# Patient Record
Sex: Male | Born: 1935
Health system: Southern US, Community
[De-identification: ages and names within clinical notes are randomized; demographics above are authoritative.]

## PROBLEM LIST (undated history)

## (undated) DIAGNOSIS — N189 Chronic kidney disease, unspecified: Secondary | ICD-10-CM

## (undated) DIAGNOSIS — I10 Essential (primary) hypertension: Secondary | ICD-10-CM

## (undated) DIAGNOSIS — I509 Heart failure, unspecified: Secondary | ICD-10-CM

## (undated) DIAGNOSIS — K5790 Diverticulosis of intestine, part unspecified, without perforation or abscess without bleeding: Secondary | ICD-10-CM

## (undated) DIAGNOSIS — I4819 Other persistent atrial fibrillation: Secondary | ICD-10-CM

## (undated) DIAGNOSIS — C801 Malignant (primary) neoplasm, unspecified: Secondary | ICD-10-CM

## (undated) DIAGNOSIS — K219 Gastro-esophageal reflux disease without esophagitis: Secondary | ICD-10-CM

## (undated) DIAGNOSIS — J449 Chronic obstructive pulmonary disease, unspecified: Secondary | ICD-10-CM

## (undated) DIAGNOSIS — D649 Anemia, unspecified: Secondary | ICD-10-CM

## (undated) HISTORY — DX: Chronic kidney disease, unspecified: N18.9

## (undated) HISTORY — DX: Anemia, unspecified: D64.9

## (undated) HISTORY — DX: Chronic obstructive pulmonary disease, unspecified: J44.9

## (undated) HISTORY — DX: Heart failure, unspecified: I50.9

## (undated) HISTORY — PX: PROSTATECTOMY: SHX69

## (undated) HISTORY — PX: PROSTATE SURGERY: SHX751

## (undated) HISTORY — DX: Gastro-esophageal reflux disease without esophagitis: K21.9

## (undated) HISTORY — PX: HERNIA REPAIR: SHX51

## (undated) HISTORY — DX: Malignant (primary) neoplasm, unspecified: C80.1

## (undated) HISTORY — DX: Essential (primary) hypertension: I10

## (undated) MED FILL — Zoledronic Acid Inj Conc For IV Infusion 4 MG/5ML: INTRAVENOUS | Qty: 3.75 | Status: AC

---

## 2011-12-09 DIAGNOSIS — M159 Polyosteoarthritis, unspecified: Secondary | ICD-10-CM | POA: Diagnosis not present

## 2011-12-09 DIAGNOSIS — I10 Essential (primary) hypertension: Secondary | ICD-10-CM | POA: Diagnosis not present

## 2011-12-09 DIAGNOSIS — F17200 Nicotine dependence, unspecified, uncomplicated: Secondary | ICD-10-CM | POA: Diagnosis not present

## 2012-05-26 DIAGNOSIS — F17200 Nicotine dependence, unspecified, uncomplicated: Secondary | ICD-10-CM | POA: Diagnosis not present

## 2012-05-26 DIAGNOSIS — R5383 Other fatigue: Secondary | ICD-10-CM | POA: Diagnosis not present

## 2012-05-26 DIAGNOSIS — N390 Urinary tract infection, site not specified: Secondary | ICD-10-CM | POA: Diagnosis not present

## 2012-05-26 DIAGNOSIS — R5381 Other malaise: Secondary | ICD-10-CM | POA: Diagnosis not present

## 2012-05-26 DIAGNOSIS — R972 Elevated prostate specific antigen [PSA]: Secondary | ICD-10-CM | POA: Diagnosis not present

## 2012-05-26 DIAGNOSIS — C61 Malignant neoplasm of prostate: Secondary | ICD-10-CM | POA: Diagnosis not present

## 2012-07-10 DIAGNOSIS — I10 Essential (primary) hypertension: Secondary | ICD-10-CM | POA: Diagnosis not present

## 2012-07-10 DIAGNOSIS — Z1331 Encounter for screening for depression: Secondary | ICD-10-CM | POA: Diagnosis not present

## 2012-10-16 DIAGNOSIS — N476 Balanoposthitis: Secondary | ICD-10-CM | POA: Diagnosis not present

## 2012-11-26 DIAGNOSIS — N393 Stress incontinence (female) (male): Secondary | ICD-10-CM | POA: Diagnosis not present

## 2012-11-26 DIAGNOSIS — R972 Elevated prostate specific antigen [PSA]: Secondary | ICD-10-CM | POA: Diagnosis not present

## 2012-11-26 DIAGNOSIS — C61 Malignant neoplasm of prostate: Secondary | ICD-10-CM | POA: Diagnosis not present

## 2012-11-26 DIAGNOSIS — N529 Male erectile dysfunction, unspecified: Secondary | ICD-10-CM | POA: Diagnosis not present

## 2013-01-11 DIAGNOSIS — G5600 Carpal tunnel syndrome, unspecified upper limb: Secondary | ICD-10-CM | POA: Diagnosis not present

## 2013-01-11 DIAGNOSIS — I10 Essential (primary) hypertension: Secondary | ICD-10-CM | POA: Diagnosis not present

## 2013-02-02 DIAGNOSIS — G5600 Carpal tunnel syndrome, unspecified upper limb: Secondary | ICD-10-CM | POA: Diagnosis not present

## 2013-02-10 DIAGNOSIS — M6281 Muscle weakness (generalized): Secondary | ICD-10-CM | POA: Diagnosis not present

## 2013-02-10 DIAGNOSIS — M79609 Pain in unspecified limb: Secondary | ICD-10-CM | POA: Diagnosis not present

## 2013-02-10 DIAGNOSIS — G5600 Carpal tunnel syndrome, unspecified upper limb: Secondary | ICD-10-CM | POA: Diagnosis not present

## 2013-02-10 DIAGNOSIS — M7989 Other specified soft tissue disorders: Secondary | ICD-10-CM | POA: Diagnosis not present

## 2013-02-18 DIAGNOSIS — G5600 Carpal tunnel syndrome, unspecified upper limb: Secondary | ICD-10-CM | POA: Diagnosis not present

## 2013-02-18 DIAGNOSIS — M6281 Muscle weakness (generalized): Secondary | ICD-10-CM | POA: Diagnosis not present

## 2013-02-18 DIAGNOSIS — M7989 Other specified soft tissue disorders: Secondary | ICD-10-CM | POA: Diagnosis not present

## 2013-02-18 DIAGNOSIS — M79609 Pain in unspecified limb: Secondary | ICD-10-CM | POA: Diagnosis not present

## 2013-05-26 DIAGNOSIS — N390 Urinary tract infection, site not specified: Secondary | ICD-10-CM | POA: Diagnosis not present

## 2013-05-26 DIAGNOSIS — R5381 Other malaise: Secondary | ICD-10-CM | POA: Diagnosis not present

## 2013-05-26 DIAGNOSIS — F17200 Nicotine dependence, unspecified, uncomplicated: Secondary | ICD-10-CM | POA: Diagnosis not present

## 2013-05-26 DIAGNOSIS — N529 Male erectile dysfunction, unspecified: Secondary | ICD-10-CM | POA: Diagnosis not present

## 2013-05-26 DIAGNOSIS — C61 Malignant neoplasm of prostate: Secondary | ICD-10-CM | POA: Diagnosis not present

## 2013-06-19 DIAGNOSIS — R109 Unspecified abdominal pain: Secondary | ICD-10-CM | POA: Diagnosis not present

## 2013-06-19 DIAGNOSIS — I10 Essential (primary) hypertension: Secondary | ICD-10-CM | POA: Diagnosis not present

## 2013-06-19 DIAGNOSIS — N200 Calculus of kidney: Secondary | ICD-10-CM | POA: Diagnosis not present

## 2013-06-19 DIAGNOSIS — N400 Enlarged prostate without lower urinary tract symptoms: Secondary | ICD-10-CM | POA: Diagnosis not present

## 2013-06-19 DIAGNOSIS — R300 Dysuria: Secondary | ICD-10-CM | POA: Diagnosis not present

## 2013-06-19 DIAGNOSIS — Z79899 Other long term (current) drug therapy: Secondary | ICD-10-CM | POA: Diagnosis not present

## 2013-06-24 DIAGNOSIS — R339 Retention of urine, unspecified: Secondary | ICD-10-CM | POA: Diagnosis not present

## 2013-06-29 DIAGNOSIS — N320 Bladder-neck obstruction: Secondary | ICD-10-CM | POA: Diagnosis not present

## 2013-06-29 DIAGNOSIS — N390 Urinary tract infection, site not specified: Secondary | ICD-10-CM | POA: Diagnosis not present

## 2013-06-29 DIAGNOSIS — C61 Malignant neoplasm of prostate: Secondary | ICD-10-CM | POA: Diagnosis not present

## 2013-06-29 DIAGNOSIS — R972 Elevated prostate specific antigen [PSA]: Secondary | ICD-10-CM | POA: Diagnosis not present

## 2013-06-30 DIAGNOSIS — Z79899 Other long term (current) drug therapy: Secondary | ICD-10-CM | POA: Diagnosis not present

## 2013-06-30 DIAGNOSIS — N320 Bladder-neck obstruction: Secondary | ICD-10-CM | POA: Diagnosis not present

## 2013-06-30 DIAGNOSIS — D303 Benign neoplasm of bladder: Secondary | ICD-10-CM | POA: Diagnosis not present

## 2013-06-30 DIAGNOSIS — I10 Essential (primary) hypertension: Secondary | ICD-10-CM | POA: Diagnosis not present

## 2013-06-30 DIAGNOSIS — C61 Malignant neoplasm of prostate: Secondary | ICD-10-CM | POA: Diagnosis not present

## 2013-06-30 DIAGNOSIS — F17200 Nicotine dependence, unspecified, uncomplicated: Secondary | ICD-10-CM | POA: Diagnosis not present

## 2013-06-30 DIAGNOSIS — N210 Calculus in bladder: Secondary | ICD-10-CM | POA: Diagnosis not present

## 2013-07-06 DIAGNOSIS — N320 Bladder-neck obstruction: Secondary | ICD-10-CM | POA: Diagnosis not present

## 2013-07-06 DIAGNOSIS — C61 Malignant neoplasm of prostate: Secondary | ICD-10-CM | POA: Diagnosis not present

## 2013-07-06 DIAGNOSIS — N390 Urinary tract infection, site not specified: Secondary | ICD-10-CM | POA: Diagnosis not present

## 2013-07-06 DIAGNOSIS — N318 Other neuromuscular dysfunction of bladder: Secondary | ICD-10-CM | POA: Diagnosis not present

## 2013-07-19 DIAGNOSIS — Z1331 Encounter for screening for depression: Secondary | ICD-10-CM | POA: Diagnosis not present

## 2013-07-19 DIAGNOSIS — I10 Essential (primary) hypertension: Secondary | ICD-10-CM | POA: Diagnosis not present

## 2013-07-19 DIAGNOSIS — Z139 Encounter for screening, unspecified: Secondary | ICD-10-CM | POA: Diagnosis not present

## 2013-07-20 DIAGNOSIS — N318 Other neuromuscular dysfunction of bladder: Secondary | ICD-10-CM | POA: Diagnosis not present

## 2013-07-20 DIAGNOSIS — N390 Urinary tract infection, site not specified: Secondary | ICD-10-CM | POA: Diagnosis not present

## 2013-07-20 DIAGNOSIS — R972 Elevated prostate specific antigen [PSA]: Secondary | ICD-10-CM | POA: Diagnosis not present

## 2013-07-20 DIAGNOSIS — N320 Bladder-neck obstruction: Secondary | ICD-10-CM | POA: Diagnosis not present

## 2013-08-19 DIAGNOSIS — R972 Elevated prostate specific antigen [PSA]: Secondary | ICD-10-CM | POA: Diagnosis not present

## 2013-08-19 DIAGNOSIS — C61 Malignant neoplasm of prostate: Secondary | ICD-10-CM | POA: Diagnosis not present

## 2013-08-19 DIAGNOSIS — N320 Bladder-neck obstruction: Secondary | ICD-10-CM | POA: Diagnosis not present

## 2013-08-19 DIAGNOSIS — N393 Stress incontinence (female) (male): Secondary | ICD-10-CM | POA: Diagnosis not present

## 2013-12-09 DIAGNOSIS — R5381 Other malaise: Secondary | ICD-10-CM | POA: Diagnosis not present

## 2013-12-09 DIAGNOSIS — N529 Male erectile dysfunction, unspecified: Secondary | ICD-10-CM | POA: Diagnosis not present

## 2013-12-09 DIAGNOSIS — R5383 Other fatigue: Secondary | ICD-10-CM | POA: Diagnosis not present

## 2013-12-09 DIAGNOSIS — C61 Malignant neoplasm of prostate: Secondary | ICD-10-CM | POA: Diagnosis not present

## 2013-12-09 DIAGNOSIS — N390 Urinary tract infection, site not specified: Secondary | ICD-10-CM | POA: Diagnosis not present

## 2014-02-01 DIAGNOSIS — Z0000 Encounter for general adult medical examination without abnormal findings: Secondary | ICD-10-CM | POA: Diagnosis not present

## 2014-02-01 DIAGNOSIS — Z139 Encounter for screening, unspecified: Secondary | ICD-10-CM | POA: Diagnosis not present

## 2014-02-01 DIAGNOSIS — F17200 Nicotine dependence, unspecified, uncomplicated: Secondary | ICD-10-CM | POA: Diagnosis not present

## 2014-02-01 DIAGNOSIS — I10 Essential (primary) hypertension: Secondary | ICD-10-CM | POA: Diagnosis not present

## 2014-02-01 DIAGNOSIS — Z1331 Encounter for screening for depression: Secondary | ICD-10-CM | POA: Diagnosis not present

## 2014-02-21 DIAGNOSIS — H4011X0 Primary open-angle glaucoma, stage unspecified: Secondary | ICD-10-CM | POA: Diagnosis not present

## 2014-03-15 DIAGNOSIS — H4011X0 Primary open-angle glaucoma, stage unspecified: Secondary | ICD-10-CM | POA: Diagnosis not present

## 2014-04-05 DIAGNOSIS — H40039 Anatomical narrow angle, unspecified eye: Secondary | ICD-10-CM | POA: Diagnosis not present

## 2014-04-26 DIAGNOSIS — H40039 Anatomical narrow angle, unspecified eye: Secondary | ICD-10-CM | POA: Diagnosis not present

## 2014-05-11 DIAGNOSIS — H40039 Anatomical narrow angle, unspecified eye: Secondary | ICD-10-CM | POA: Diagnosis not present

## 2014-06-09 DIAGNOSIS — N390 Urinary tract infection, site not specified: Secondary | ICD-10-CM | POA: Diagnosis not present

## 2014-06-09 DIAGNOSIS — R972 Elevated prostate specific antigen [PSA]: Secondary | ICD-10-CM | POA: Diagnosis not present

## 2014-06-09 DIAGNOSIS — C61 Malignant neoplasm of prostate: Secondary | ICD-10-CM | POA: Diagnosis not present

## 2014-06-09 DIAGNOSIS — N529 Male erectile dysfunction, unspecified: Secondary | ICD-10-CM | POA: Diagnosis not present

## 2014-08-09 DIAGNOSIS — I10 Essential (primary) hypertension: Secondary | ICD-10-CM | POA: Diagnosis not present

## 2014-11-15 DIAGNOSIS — M25462 Effusion, left knee: Secondary | ICD-10-CM | POA: Diagnosis not present

## 2014-11-15 DIAGNOSIS — M25569 Pain in unspecified knee: Secondary | ICD-10-CM | POA: Diagnosis not present

## 2014-11-15 DIAGNOSIS — M25562 Pain in left knee: Secondary | ICD-10-CM | POA: Diagnosis not present

## 2014-12-06 DIAGNOSIS — N3090 Cystitis, unspecified without hematuria: Secondary | ICD-10-CM | POA: Diagnosis not present

## 2014-12-06 DIAGNOSIS — C61 Malignant neoplasm of prostate: Secondary | ICD-10-CM | POA: Diagnosis not present

## 2014-12-06 DIAGNOSIS — N5203 Combined arterial insufficiency and corporo-venous occlusive erectile dysfunction: Secondary | ICD-10-CM | POA: Diagnosis not present

## 2014-12-06 DIAGNOSIS — N393 Stress incontinence (female) (male): Secondary | ICD-10-CM | POA: Diagnosis not present

## 2015-01-28 DIAGNOSIS — I951 Orthostatic hypotension: Secondary | ICD-10-CM | POA: Diagnosis not present

## 2015-01-28 DIAGNOSIS — K922 Gastrointestinal hemorrhage, unspecified: Secondary | ICD-10-CM | POA: Diagnosis not present

## 2015-01-29 DIAGNOSIS — K922 Gastrointestinal hemorrhage, unspecified: Secondary | ICD-10-CM | POA: Diagnosis not present

## 2015-01-29 DIAGNOSIS — K219 Gastro-esophageal reflux disease without esophagitis: Secondary | ICD-10-CM | POA: Diagnosis not present

## 2015-01-29 DIAGNOSIS — D62 Acute posthemorrhagic anemia: Secondary | ICD-10-CM | POA: Diagnosis not present

## 2015-01-29 DIAGNOSIS — N400 Enlarged prostate without lower urinary tract symptoms: Secondary | ICD-10-CM | POA: Diagnosis not present

## 2015-01-30 DIAGNOSIS — K219 Gastro-esophageal reflux disease without esophagitis: Secondary | ICD-10-CM | POA: Diagnosis not present

## 2015-01-30 DIAGNOSIS — D62 Acute posthemorrhagic anemia: Secondary | ICD-10-CM | POA: Diagnosis not present

## 2015-01-30 DIAGNOSIS — K5791 Diverticulosis of intestine, part unspecified, without perforation or abscess with bleeding: Secondary | ICD-10-CM | POA: Diagnosis not present

## 2015-01-30 DIAGNOSIS — K922 Gastrointestinal hemorrhage, unspecified: Secondary | ICD-10-CM | POA: Diagnosis not present

## 2015-01-30 DIAGNOSIS — N400 Enlarged prostate without lower urinary tract symptoms: Secondary | ICD-10-CM | POA: Diagnosis not present

## 2015-01-31 DIAGNOSIS — R1084 Generalized abdominal pain: Secondary | ICD-10-CM | POA: Diagnosis not present

## 2015-01-31 DIAGNOSIS — K922 Gastrointestinal hemorrhage, unspecified: Secondary | ICD-10-CM | POA: Diagnosis not present

## 2015-02-02 DIAGNOSIS — K922 Gastrointestinal hemorrhage, unspecified: Secondary | ICD-10-CM | POA: Diagnosis not present

## 2015-02-02 DIAGNOSIS — D649 Anemia, unspecified: Secondary | ICD-10-CM | POA: Diagnosis not present

## 2015-02-02 DIAGNOSIS — K219 Gastro-esophageal reflux disease without esophagitis: Secondary | ICD-10-CM | POA: Diagnosis not present

## 2015-02-02 DIAGNOSIS — I10 Essential (primary) hypertension: Secondary | ICD-10-CM | POA: Diagnosis not present

## 2015-03-02 DIAGNOSIS — K5791 Diverticulosis of intestine, part unspecified, without perforation or abscess with bleeding: Secondary | ICD-10-CM | POA: Diagnosis not present

## 2015-04-12 DIAGNOSIS — K2900 Acute gastritis without bleeding: Secondary | ICD-10-CM | POA: Diagnosis not present

## 2015-04-12 DIAGNOSIS — Z048 Encounter for examination and observation for other specified reasons: Secondary | ICD-10-CM | POA: Diagnosis not present

## 2015-04-12 DIAGNOSIS — D649 Anemia, unspecified: Secondary | ICD-10-CM | POA: Diagnosis not present

## 2015-04-12 DIAGNOSIS — K2980 Duodenitis without bleeding: Secondary | ICD-10-CM | POA: Diagnosis not present

## 2015-04-12 DIAGNOSIS — K219 Gastro-esophageal reflux disease without esophagitis: Secondary | ICD-10-CM | POA: Diagnosis not present

## 2015-06-08 DIAGNOSIS — R5383 Other fatigue: Secondary | ICD-10-CM | POA: Diagnosis not present

## 2015-06-08 DIAGNOSIS — C61 Malignant neoplasm of prostate: Secondary | ICD-10-CM | POA: Diagnosis not present

## 2015-06-08 DIAGNOSIS — R972 Elevated prostate specific antigen [PSA]: Secondary | ICD-10-CM | POA: Diagnosis not present

## 2015-06-08 DIAGNOSIS — N5203 Combined arterial insufficiency and corporo-venous occlusive erectile dysfunction: Secondary | ICD-10-CM | POA: Diagnosis not present

## 2015-06-08 DIAGNOSIS — N3090 Cystitis, unspecified without hematuria: Secondary | ICD-10-CM | POA: Diagnosis not present

## 2015-07-11 DIAGNOSIS — I1310 Hypertensive heart and chronic kidney disease without heart failure, with stage 1 through stage 4 chronic kidney disease, or unspecified chronic kidney disease: Secondary | ICD-10-CM | POA: Diagnosis not present

## 2015-07-11 DIAGNOSIS — N183 Chronic kidney disease, stage 3 (moderate): Secondary | ICD-10-CM | POA: Diagnosis not present

## 2015-08-08 DIAGNOSIS — M10072 Idiopathic gout, left ankle and foot: Secondary | ICD-10-CM | POA: Diagnosis not present

## 2015-08-15 DIAGNOSIS — Z0000 Encounter for general adult medical examination without abnormal findings: Secondary | ICD-10-CM | POA: Diagnosis not present

## 2015-08-15 DIAGNOSIS — Z139 Encounter for screening, unspecified: Secondary | ICD-10-CM | POA: Diagnosis not present

## 2015-08-15 DIAGNOSIS — Z1389 Encounter for screening for other disorder: Secondary | ICD-10-CM | POA: Diagnosis not present

## 2015-12-07 DIAGNOSIS — C61 Malignant neoplasm of prostate: Secondary | ICD-10-CM | POA: Diagnosis not present

## 2015-12-07 DIAGNOSIS — N393 Stress incontinence (female) (male): Secondary | ICD-10-CM | POA: Diagnosis not present

## 2015-12-07 DIAGNOSIS — N3020 Other chronic cystitis without hematuria: Secondary | ICD-10-CM | POA: Diagnosis not present

## 2015-12-07 DIAGNOSIS — R972 Elevated prostate specific antigen [PSA]: Secondary | ICD-10-CM | POA: Diagnosis not present

## 2015-12-07 DIAGNOSIS — N5203 Combined arterial insufficiency and corporo-venous occlusive erectile dysfunction: Secondary | ICD-10-CM | POA: Diagnosis not present

## 2016-04-09 DIAGNOSIS — I10 Essential (primary) hypertension: Secondary | ICD-10-CM | POA: Diagnosis not present

## 2016-04-09 DIAGNOSIS — M109 Gout, unspecified: Secondary | ICD-10-CM | POA: Diagnosis not present

## 2016-04-09 DIAGNOSIS — S30861A Insect bite (nonvenomous) of abdominal wall, initial encounter: Secondary | ICD-10-CM | POA: Diagnosis not present

## 2016-04-09 DIAGNOSIS — N63 Unspecified lump in breast: Secondary | ICD-10-CM | POA: Diagnosis not present

## 2016-04-09 DIAGNOSIS — N644 Mastodynia: Secondary | ICD-10-CM | POA: Diagnosis not present

## 2016-04-09 DIAGNOSIS — W57XXXA Bitten or stung by nonvenomous insect and other nonvenomous arthropods, initial encounter: Secondary | ICD-10-CM | POA: Diagnosis not present

## 2016-04-11 DIAGNOSIS — N644 Mastodynia: Secondary | ICD-10-CM | POA: Diagnosis not present

## 2016-04-11 DIAGNOSIS — N63 Unspecified lump in breast: Secondary | ICD-10-CM | POA: Diagnosis not present

## 2016-04-11 DIAGNOSIS — N62 Hypertrophy of breast: Secondary | ICD-10-CM | POA: Diagnosis not present

## 2016-06-06 DIAGNOSIS — N318 Other neuromuscular dysfunction of bladder: Secondary | ICD-10-CM | POA: Diagnosis not present

## 2016-06-06 DIAGNOSIS — R972 Elevated prostate specific antigen [PSA]: Secondary | ICD-10-CM | POA: Diagnosis not present

## 2016-06-06 DIAGNOSIS — N3020 Other chronic cystitis without hematuria: Secondary | ICD-10-CM | POA: Diagnosis not present

## 2016-06-06 DIAGNOSIS — N5203 Combined arterial insufficiency and corporo-venous occlusive erectile dysfunction: Secondary | ICD-10-CM | POA: Diagnosis not present

## 2016-06-06 DIAGNOSIS — C61 Malignant neoplasm of prostate: Secondary | ICD-10-CM | POA: Diagnosis not present

## 2016-07-12 DIAGNOSIS — C61 Malignant neoplasm of prostate: Secondary | ICD-10-CM | POA: Diagnosis not present

## 2016-08-28 DIAGNOSIS — H524 Presbyopia: Secondary | ICD-10-CM | POA: Diagnosis not present

## 2016-08-28 DIAGNOSIS — H2513 Age-related nuclear cataract, bilateral: Secondary | ICD-10-CM | POA: Diagnosis not present

## 2016-08-28 DIAGNOSIS — H40033 Anatomical narrow angle, bilateral: Secondary | ICD-10-CM | POA: Diagnosis not present

## 2016-09-05 DIAGNOSIS — H40033 Anatomical narrow angle, bilateral: Secondary | ICD-10-CM | POA: Diagnosis not present

## 2016-09-24 DIAGNOSIS — H40033 Anatomical narrow angle, bilateral: Secondary | ICD-10-CM | POA: Diagnosis not present

## 2016-10-02 DIAGNOSIS — C61 Malignant neoplasm of prostate: Secondary | ICD-10-CM | POA: Diagnosis not present

## 2016-10-02 DIAGNOSIS — R32 Unspecified urinary incontinence: Secondary | ICD-10-CM | POA: Diagnosis not present

## 2016-10-02 DIAGNOSIS — N3000 Acute cystitis without hematuria: Secondary | ICD-10-CM | POA: Diagnosis not present

## 2016-10-10 DIAGNOSIS — H40033 Anatomical narrow angle, bilateral: Secondary | ICD-10-CM | POA: Diagnosis not present

## 2016-11-11 DIAGNOSIS — C61 Malignant neoplasm of prostate: Secondary | ICD-10-CM | POA: Diagnosis not present

## 2016-11-28 DIAGNOSIS — Z9181 History of falling: Secondary | ICD-10-CM | POA: Diagnosis not present

## 2016-11-28 DIAGNOSIS — Z1389 Encounter for screening for other disorder: Secondary | ICD-10-CM | POA: Diagnosis not present

## 2016-11-28 DIAGNOSIS — Z139 Encounter for screening, unspecified: Secondary | ICD-10-CM | POA: Diagnosis not present

## 2016-11-28 DIAGNOSIS — Z0000 Encounter for general adult medical examination without abnormal findings: Secondary | ICD-10-CM | POA: Diagnosis not present

## 2016-11-28 DIAGNOSIS — M109 Gout, unspecified: Secondary | ICD-10-CM | POA: Diagnosis not present

## 2016-11-28 DIAGNOSIS — I1310 Hypertensive heart and chronic kidney disease without heart failure, with stage 1 through stage 4 chronic kidney disease, or unspecified chronic kidney disease: Secondary | ICD-10-CM | POA: Diagnosis not present

## 2016-11-28 DIAGNOSIS — N183 Chronic kidney disease, stage 3 (moderate): Secondary | ICD-10-CM | POA: Diagnosis not present

## 2017-01-09 DIAGNOSIS — H40033 Anatomical narrow angle, bilateral: Secondary | ICD-10-CM | POA: Diagnosis not present

## 2017-01-28 DIAGNOSIS — I951 Orthostatic hypotension: Secondary | ICD-10-CM | POA: Diagnosis not present

## 2017-01-28 DIAGNOSIS — Z6823 Body mass index (BMI) 23.0-23.9, adult: Secondary | ICD-10-CM | POA: Diagnosis not present

## 2017-01-30 DIAGNOSIS — H40033 Anatomical narrow angle, bilateral: Secondary | ICD-10-CM | POA: Diagnosis not present

## 2017-02-04 DIAGNOSIS — Z6823 Body mass index (BMI) 23.0-23.9, adult: Secondary | ICD-10-CM | POA: Diagnosis not present

## 2017-02-04 DIAGNOSIS — R0602 Shortness of breath: Secondary | ICD-10-CM | POA: Diagnosis not present

## 2017-02-04 DIAGNOSIS — I959 Hypotension, unspecified: Secondary | ICD-10-CM | POA: Diagnosis not present

## 2017-02-06 DIAGNOSIS — N179 Acute kidney failure, unspecified: Secondary | ICD-10-CM | POA: Diagnosis not present

## 2017-02-06 DIAGNOSIS — E871 Hypo-osmolality and hyponatremia: Secondary | ICD-10-CM | POA: Diagnosis not present

## 2017-02-06 DIAGNOSIS — N189 Chronic kidney disease, unspecified: Secondary | ICD-10-CM | POA: Diagnosis not present

## 2017-02-06 DIAGNOSIS — D72829 Elevated white blood cell count, unspecified: Secondary | ICD-10-CM | POA: Diagnosis not present

## 2017-02-13 DIAGNOSIS — N179 Acute kidney failure, unspecified: Secondary | ICD-10-CM | POA: Diagnosis not present

## 2017-02-13 DIAGNOSIS — D72829 Elevated white blood cell count, unspecified: Secondary | ICD-10-CM | POA: Diagnosis not present

## 2017-02-18 DIAGNOSIS — D649 Anemia, unspecified: Secondary | ICD-10-CM | POA: Diagnosis not present

## 2017-02-18 DIAGNOSIS — K922 Gastrointestinal hemorrhage, unspecified: Secondary | ICD-10-CM | POA: Diagnosis not present

## 2017-02-18 DIAGNOSIS — I499 Cardiac arrhythmia, unspecified: Secondary | ICD-10-CM | POA: Diagnosis not present

## 2017-02-27 DIAGNOSIS — Z6823 Body mass index (BMI) 23.0-23.9, adult: Secondary | ICD-10-CM | POA: Diagnosis not present

## 2017-02-27 DIAGNOSIS — D649 Anemia, unspecified: Secondary | ICD-10-CM | POA: Diagnosis not present

## 2017-03-11 DIAGNOSIS — D500 Iron deficiency anemia secondary to blood loss (chronic): Secondary | ICD-10-CM | POA: Diagnosis not present

## 2017-03-11 DIAGNOSIS — K219 Gastro-esophageal reflux disease without esophagitis: Secondary | ICD-10-CM | POA: Diagnosis not present

## 2017-03-11 DIAGNOSIS — N189 Chronic kidney disease, unspecified: Secondary | ICD-10-CM | POA: Diagnosis not present

## 2017-03-11 DIAGNOSIS — R160 Hepatomegaly, not elsewhere classified: Secondary | ICD-10-CM | POA: Diagnosis not present

## 2017-03-13 DIAGNOSIS — N3020 Other chronic cystitis without hematuria: Secondary | ICD-10-CM | POA: Diagnosis not present

## 2017-03-13 DIAGNOSIS — C61 Malignant neoplasm of prostate: Secondary | ICD-10-CM | POA: Diagnosis not present

## 2017-03-19 DIAGNOSIS — H353130 Nonexudative age-related macular degeneration, bilateral, stage unspecified: Secondary | ICD-10-CM | POA: Diagnosis not present

## 2017-03-19 DIAGNOSIS — H25813 Combined forms of age-related cataract, bilateral: Secondary | ICD-10-CM | POA: Diagnosis not present

## 2017-03-19 DIAGNOSIS — H4053X3 Glaucoma secondary to other eye disorders, bilateral, severe stage: Secondary | ICD-10-CM | POA: Diagnosis not present

## 2017-03-21 DIAGNOSIS — D649 Anemia, unspecified: Secondary | ICD-10-CM | POA: Diagnosis not present

## 2017-03-25 DIAGNOSIS — N189 Chronic kidney disease, unspecified: Secondary | ICD-10-CM | POA: Diagnosis not present

## 2017-03-25 DIAGNOSIS — D500 Iron deficiency anemia secondary to blood loss (chronic): Secondary | ICD-10-CM | POA: Diagnosis not present

## 2017-03-27 DIAGNOSIS — K5730 Diverticulosis of large intestine without perforation or abscess without bleeding: Secondary | ICD-10-CM | POA: Diagnosis not present

## 2017-03-27 DIAGNOSIS — K769 Liver disease, unspecified: Secondary | ICD-10-CM | POA: Diagnosis not present

## 2017-03-27 DIAGNOSIS — N189 Chronic kidney disease, unspecified: Secondary | ICD-10-CM | POA: Diagnosis not present

## 2017-03-27 DIAGNOSIS — R932 Abnormal findings on diagnostic imaging of liver and biliary tract: Secondary | ICD-10-CM | POA: Diagnosis not present

## 2017-03-27 DIAGNOSIS — D500 Iron deficiency anemia secondary to blood loss (chronic): Secondary | ICD-10-CM | POA: Diagnosis not present

## 2017-03-27 DIAGNOSIS — R160 Hepatomegaly, not elsewhere classified: Secondary | ICD-10-CM | POA: Diagnosis not present

## 2017-04-01 DIAGNOSIS — R160 Hepatomegaly, not elsewhere classified: Secondary | ICD-10-CM | POA: Diagnosis not present

## 2017-04-01 DIAGNOSIS — D509 Iron deficiency anemia, unspecified: Secondary | ICD-10-CM | POA: Diagnosis not present

## 2017-04-01 DIAGNOSIS — K759 Inflammatory liver disease, unspecified: Secondary | ICD-10-CM | POA: Diagnosis not present

## 2017-04-01 DIAGNOSIS — R935 Abnormal findings on diagnostic imaging of other abdominal regions, including retroperitoneum: Secondary | ICD-10-CM | POA: Diagnosis not present

## 2017-04-01 DIAGNOSIS — Z79899 Other long term (current) drug therapy: Secondary | ICD-10-CM | POA: Diagnosis not present

## 2017-04-17 ENCOUNTER — Encounter (HOSPITAL_COMMUNITY): Payer: Self-pay

## 2017-04-18 DIAGNOSIS — K750 Abscess of liver: Secondary | ICD-10-CM | POA: Diagnosis not present

## 2017-04-18 DIAGNOSIS — Z6823 Body mass index (BMI) 23.0-23.9, adult: Secondary | ICD-10-CM | POA: Diagnosis not present

## 2017-04-30 ENCOUNTER — Encounter (HOSPITAL_COMMUNITY): Payer: Self-pay

## 2017-05-07 DIAGNOSIS — H4053X3 Glaucoma secondary to other eye disorders, bilateral, severe stage: Secondary | ICD-10-CM | POA: Diagnosis not present

## 2017-05-09 DIAGNOSIS — Z720 Tobacco use: Secondary | ICD-10-CM | POA: Diagnosis not present

## 2017-05-09 DIAGNOSIS — K750 Abscess of liver: Secondary | ICD-10-CM | POA: Diagnosis not present

## 2017-05-09 DIAGNOSIS — Z1503 Genetic susceptibility to malignant neoplasm of prostate: Secondary | ICD-10-CM | POA: Diagnosis not present

## 2017-05-09 DIAGNOSIS — Z139 Encounter for screening, unspecified: Secondary | ICD-10-CM | POA: Diagnosis not present

## 2017-05-09 DIAGNOSIS — Z8546 Personal history of malignant neoplasm of prostate: Secondary | ICD-10-CM | POA: Diagnosis not present

## 2017-05-09 DIAGNOSIS — D649 Anemia, unspecified: Secondary | ICD-10-CM | POA: Diagnosis not present

## 2017-05-09 DIAGNOSIS — Z1389 Encounter for screening for other disorder: Secondary | ICD-10-CM | POA: Diagnosis not present

## 2017-05-12 DIAGNOSIS — F3160 Bipolar disorder, current episode mixed, unspecified: Secondary | ICD-10-CM | POA: Diagnosis not present

## 2017-05-22 ENCOUNTER — Ambulatory Visit (INDEPENDENT_AMBULATORY_CARE_PROVIDER_SITE_OTHER): Payer: Medicare Other | Admitting: Internal Medicine

## 2017-05-22 ENCOUNTER — Encounter: Payer: Self-pay | Admitting: Internal Medicine

## 2017-05-22 DIAGNOSIS — I10 Essential (primary) hypertension: Secondary | ICD-10-CM | POA: Diagnosis not present

## 2017-05-22 DIAGNOSIS — Z8719 Personal history of other diseases of the digestive system: Secondary | ICD-10-CM | POA: Insufficient documentation

## 2017-05-22 DIAGNOSIS — D649 Anemia, unspecified: Secondary | ICD-10-CM | POA: Insufficient documentation

## 2017-05-22 DIAGNOSIS — C61 Malignant neoplasm of prostate: Secondary | ICD-10-CM | POA: Diagnosis not present

## 2017-05-22 DIAGNOSIS — N189 Chronic kidney disease, unspecified: Secondary | ICD-10-CM | POA: Diagnosis not present

## 2017-05-22 DIAGNOSIS — K769 Liver disease, unspecified: Secondary | ICD-10-CM | POA: Insufficient documentation

## 2017-05-22 DIAGNOSIS — K219 Gastro-esophageal reflux disease without esophagitis: Secondary | ICD-10-CM

## 2017-05-22 DIAGNOSIS — F17210 Nicotine dependence, cigarettes, uncomplicated: Secondary | ICD-10-CM

## 2017-05-22 LAB — COMPREHENSIVE METABOLIC PANEL
ALK PHOS: 96 U/L (ref 40–115)
ALT: 13 U/L (ref 9–46)
AST: 18 U/L (ref 10–35)
Albumin: 3.8 g/dL (ref 3.6–5.1)
BILIRUBIN TOTAL: 0.4 mg/dL (ref 0.2–1.2)
BUN: 20 mg/dL (ref 7–25)
CO2: 20 mmol/L (ref 20–31)
Calcium: 9.4 mg/dL (ref 8.6–10.3)
Chloride: 108 mmol/L (ref 98–110)
Creat: 1.37 mg/dL — ABNORMAL HIGH (ref 0.70–1.11)
Glucose, Bld: 112 mg/dL — ABNORMAL HIGH (ref 65–99)
POTASSIUM: 4.2 mmol/L (ref 3.5–5.3)
Sodium: 138 mmol/L (ref 135–146)
TOTAL PROTEIN: 7.4 g/dL (ref 6.1–8.1)

## 2017-05-22 LAB — CBC
HEMATOCRIT: 42.9 % (ref 38.5–50.0)
HEMOGLOBIN: 14.1 g/dL (ref 13.2–17.1)
MCH: 28 pg (ref 27.0–33.0)
MCHC: 32.9 g/dL (ref 32.0–36.0)
MCV: 85.1 fL (ref 80.0–100.0)
MPV: 10.3 fL (ref 7.5–12.5)
Platelets: 195 10*3/uL (ref 140–400)
RBC: 5.04 MIL/uL (ref 4.20–5.80)
RDW: 15.3 % — ABNORMAL HIGH (ref 11.0–15.0)
WBC: 7.4 10*3/uL (ref 3.8–10.8)

## 2017-05-22 NOTE — Progress Notes (Signed)
[facility]  Reason for Consult: Possible liver abscess Referring Physician: Dr. Marco Hamilton  Patient Active Problem List   Diagnosis Date Noted  . Hepatic lesion 05/22/2017    Priority: High  . History of diverticulitis 05/22/2017  . History of GI bleed 05/22/2017  . Prostate cancer ([facility]) 05/22/2017  . GERD (gastroesophageal reflux disease) 05/22/2017  . Normocytic anemia 05/22/2017  . Hypertension 05/22/2017  . Chronic kidney disease 05/22/2017  . Cigarette smoker 05/22/2017    Patient's Medications  New Prescriptions   No medications on file  Previous Medications   ALLOPURINOL (ZYLOPRIM) 300 MG TABLET    Take 150 mg by mouth daily.   FERROUS SULFATE 325 (65 FE) MG TABLET    Take 325 mg by mouth 2 (two) times daily with a meal.  Modified Medications   No medications on file  Discontinued Medications   No medications on file    Recommendations: 1. Abdominal MRI with contrast  Assessment: [name] has a granulomatous liver lesion of unknown cause. His QuantiFERON TB gold assay is positive suggesting that he has been exposed to tuberculosis. If tuberculosis was causing a liver abscess I would expect him to be much sicker than he is currently. I have discussed the case with Dr. Nyra Hamilton. I will proceed with abdominal MRI scan with contrast. Assuming the lesion persist I will consider repeat liver biopsy for pathologic review, special stains, routine, AFB and fungal cultures.  HPI: [facility] is a 83 y.o. male felt that he was in good health until he had sudden onset of dizziness, fatigue, anorexia, and shortness of breath in February. He was found to have orthostatic hypotension and to be anemic with GI bleeding. He recalls having a very brief period of mild chills but does not believe that he had any documented fever. He thinks he may have lost a few pounds. Transferred to [facility] for GI evaluation. I believe he underwent  upper and lower endoscopies but I do not have reports for those procedures.  He had mild elevation of his alkaline phosphatase but otherwise his liver enzymes were normal. He underwent an abdominal CT scan on 03/25/2017 which revealed an enhancing 5.2 x 3.6 cm left hepatic lobe mass. He underwent a liver biopsy on 04/01/2017. Pathology revealed an inflammatory process Rich and epithelioid histiocytes and plasma cells and an elastic stroma. Special stains did not reveal any bacteria, AFB or fungi. The biopsy specimen was not sent for culture. The slides were sent to [facility] laboratory for second opinion. Their opinion was that the differential diagnoses included hepatic granulomatous abscess and less likely hepatic segmental atrophy.  At some point he was started on supplemental iron. He says that he began to feel better and now is nearly back to his normal self. He still has some shortness of breath but he also continues to smoke cigarettes. He is no longer feeling dizzy or fatigued and he states that his appetite is back to normal. He does daughter feel like he did not feel well for about 4-6 weeks. He believes that he was already feeling better when he underwent a CT scan and liver biopsy.  He was on empiric oral doxycycline from May 9-18. Dr. Nyra Hamilton spoke with my partner, Dr. Talbot Hamilton on 04/18/2017 and he recommended switching to empiric amoxicillin clavulanate. He took that for 14 days.  He does not believe he has ever been tested for tuberculosis.  He has never been around anyone with tuberculosis that he knows of. He has never been diagnosed with pneumonia. He has no history of inflammatory bowel disease.  Review of Systems: Review of Systems  Constitutional: Positive for chills, malaise/fatigue and weight loss. Negative for diaphoresis and fever.  HENT: Negative for congestion and sore throat.   Respiratory: Positive for shortness of breath. Negative for cough and sputum  production.   Cardiovascular: Negative for chest pain.  Gastrointestinal: Negative for abdominal pain, diarrhea, heartburn, nausea and vomiting.       Transient anorexia.  Genitourinary: Negative for dysuria.  Musculoskeletal: Negative for back pain, joint pain and myalgias.  Skin: Negative for rash.  Neurological: Positive for dizziness. Negative for weakness and headaches.  Psychiatric/Behavioral: Negative for depression.      Past Medical History:  Diagnosis Date  . Anemia   . Cancer ([place])   . Chronic kidney disease   . GERD (gastroesophageal reflux disease)   . Hypertension     Social History  Substance Use Topics  . Smoking status: Current Every Day Smoker    Types: Cigarettes  . Smokeless tobacco: Never Used  . Alcohol use Yes    No family history on file. No Known Allergies  OBJECTIVE: Vitals:   05/22/17 0916  BP: (!) 170/78  Pulse: (!) 47  Temp: 98.6 F (37 C)  TempSrc: Oral  Weight: 157 lb (71.2 kg)   There is no height or weight on file to calculate BMI.   Physical Exam  Constitutional: He is oriented to person, place, and time.  He is in good spirits today. He is in no distress. He is accompanied by his daughter.  HENT:  Mouth/Throat: No oropharyngeal exudate.  Eyes: Conjunctivae are normal.  Neck: Neck supple.  Cardiovascular: Normal rate and regular rhythm.   No murmur heard. Pulmonary/Chest: Effort normal and breath sounds normal. He has no wheezes. He has no rales.  Abdominal: Soft. He exhibits no distension and no mass. There is no tenderness.  A smooth, nontender liver edge is palpable just beneath the costal margin on deep inspiration. I do not palpate his spleen.  Musculoskeletal: Normal range of motion. He exhibits no edema or tenderness.  Lymphadenopathy:       Head (right side): No submandibular adenopathy present.       Head (left side): No submandibular adenopathy present.    He has no cervical adenopathy.    He has no axillary  adenopathy.       Right: No epitrochlear adenopathy present.       Left: No epitrochlear adenopathy present.  Neurological: He is alert and oriented to person, place, and time. Gait normal.  Skin: No rash noted.  Psychiatric: Mood and affect normal.   Lab Results  Component Value Date   WBC 7.4 05/22/2017   HGB 14.1 05/22/2017   HCT 42.9 05/22/2017   MCV 85.1 05/22/2017   PLT 195 05/22/2017   CMP     Component Value Date/Time   NA 138 05/22/2017 0954   K 4.2 05/22/2017 0954   CL 108 05/22/2017 0954   CO2 20 05/22/2017 0954   GLUCOSE 112 (H) 05/22/2017 0954   BUN 20 05/22/2017 0954   CREATININE 1.37 (H) 05/22/2017 0954   CALCIUM 9.4 05/22/2017 0954   PROT 7.4 05/22/2017 0954   ALBUMIN 3.8 05/22/2017 0954   AST 18 05/22/2017 0954   ALT 13 05/22/2017 0954   ALKPHOS 96 05/22/2017 0954   BILITOT 0.4 05/22/2017 0954  QuantiFERON TB gold assay 05/22/2017: Positive Angiotensin-converting enzyme 05/22/2017: Negative  Microbiology: No results found for this or any previous visit (from the past 240 hour(s)).  Robert Bickers, MD [facility] [phone] pager   [phone] cell 05/23/2017, 2:27 PM

## 2017-05-23 LAB — ANGIOTENSIN CONVERTING ENZYME: Angiotensin-Converting Enzyme: 25 U/L (ref 9–67)

## 2017-05-26 DIAGNOSIS — I1310 Hypertensive heart and chronic kidney disease without heart failure, with stage 1 through stage 4 chronic kidney disease, or unspecified chronic kidney disease: Secondary | ICD-10-CM | POA: Diagnosis not present

## 2017-05-26 DIAGNOSIS — Z6823 Body mass index (BMI) 23.0-23.9, adult: Secondary | ICD-10-CM | POA: Diagnosis not present

## 2017-05-26 DIAGNOSIS — N183 Chronic kidney disease, stage 3 (moderate): Secondary | ICD-10-CM | POA: Diagnosis not present

## 2017-05-26 LAB — QUANTIFERON TB GOLD ASSAY (BLOOD)
Interferon Gamma Release Assay: POSITIVE — AB
Mitogen-Nil: 5.89 IU/mL
Quantiferon Nil Value: 2.14 IU/mL
Quantiferon Tb Ag Minus Nil Value: 6.29 IU/mL

## 2017-05-29 ENCOUNTER — Telehealth: Payer: Self-pay | Admitting: *Deleted

## 2017-05-29 ENCOUNTER — Other Ambulatory Visit: Payer: Self-pay | Admitting: Internal Medicine

## 2017-05-29 DIAGNOSIS — M109 Gout, unspecified: Secondary | ICD-10-CM | POA: Diagnosis not present

## 2017-05-29 DIAGNOSIS — I1310 Hypertensive heart and chronic kidney disease without heart failure, with stage 1 through stage 4 chronic kidney disease, or unspecified chronic kidney disease: Secondary | ICD-10-CM | POA: Diagnosis not present

## 2017-05-29 DIAGNOSIS — R7303 Prediabetes: Secondary | ICD-10-CM | POA: Diagnosis not present

## 2017-05-29 DIAGNOSIS — K769 Liver disease, unspecified: Secondary | ICD-10-CM

## 2017-05-29 DIAGNOSIS — Z13220 Encounter for screening for lipoid disorders: Secondary | ICD-10-CM | POA: Diagnosis not present

## 2017-05-29 NOTE — Telephone Encounter (Signed)
Patient's daughter, [name] called back and she was given this appt information. Myrtis Hopping

## 2017-05-29 NOTE — Telephone Encounter (Signed)
Called and left a voice mail for daughter to return the call. Patient is scheduled for MRI in [place] for 06/05/17 at 10:15 AM.

## 2017-06-09 ENCOUNTER — Encounter: Payer: Self-pay | Admitting: Internal Medicine

## 2017-06-09 DIAGNOSIS — K769 Liver disease, unspecified: Secondary | ICD-10-CM | POA: Diagnosis not present

## 2017-06-09 DIAGNOSIS — C229 Malignant neoplasm of liver, not specified as primary or secondary: Secondary | ICD-10-CM | POA: Diagnosis not present

## 2017-06-16 ENCOUNTER — Telehealth: Payer: Self-pay | Admitting: Internal Medicine

## 2017-06-16 NOTE — Telephone Encounter (Signed)
I called [name] today. He reports that he is feeling well and back to his normal baseline. He underwent a repeat abdominal MRI with and without contrast at [facility] on 06/09/2017. The scan was read by Dr. Trudee Kuster. His overall impression was as follows:  2.4 cm lesion in the left hepatic lobe, with subtle progressive enhancement, although decreased from the prior (had previously measured 3.6 x 5.2 cm). Given the interval improvement in the prior biopsy results, a benign etiology is favored, and metastatic disease is considered unlikely. No new hepatic lesions.  I think active liver abscess is very unlikely. He only received a 10 day course of doxycycline followed by 14 days of amoxicillin clavulanate. I'm not certain what the lesion is due to but I would not pursue further testing or treatment for liver abscess at this time. I would be happy to see him back in the future if there are any other findings to suggest active infection.

## 2017-06-18 DIAGNOSIS — R7303 Prediabetes: Secondary | ICD-10-CM | POA: Diagnosis not present

## 2017-06-18 DIAGNOSIS — I1310 Hypertensive heart and chronic kidney disease without heart failure, with stage 1 through stage 4 chronic kidney disease, or unspecified chronic kidney disease: Secondary | ICD-10-CM | POA: Diagnosis not present

## 2017-06-18 DIAGNOSIS — N183 Chronic kidney disease, stage 3 (moderate): Secondary | ICD-10-CM | POA: Diagnosis not present

## 2017-06-18 DIAGNOSIS — M109 Gout, unspecified: Secondary | ICD-10-CM | POA: Diagnosis not present

## 2017-11-18 DIAGNOSIS — R7303 Prediabetes: Secondary | ICD-10-CM | POA: Diagnosis not present

## 2017-11-18 DIAGNOSIS — I1310 Hypertensive heart and chronic kidney disease without heart failure, with stage 1 through stage 4 chronic kidney disease, or unspecified chronic kidney disease: Secondary | ICD-10-CM | POA: Diagnosis not present

## 2017-12-03 DIAGNOSIS — N183 Chronic kidney disease, stage 3 (moderate): Secondary | ICD-10-CM | POA: Diagnosis not present

## 2017-12-03 DIAGNOSIS — Z9181 History of falling: Secondary | ICD-10-CM | POA: Diagnosis not present

## 2017-12-03 DIAGNOSIS — I1310 Hypertensive heart and chronic kidney disease without heart failure, with stage 1 through stage 4 chronic kidney disease, or unspecified chronic kidney disease: Secondary | ICD-10-CM | POA: Diagnosis not present

## 2017-12-03 DIAGNOSIS — Z1331 Encounter for screening for depression: Secondary | ICD-10-CM | POA: Diagnosis not present

## 2017-12-03 DIAGNOSIS — R7303 Prediabetes: Secondary | ICD-10-CM | POA: Diagnosis not present

## 2017-12-03 DIAGNOSIS — M109 Gout, unspecified: Secondary | ICD-10-CM | POA: Diagnosis not present

## 2017-12-03 DIAGNOSIS — Z139 Encounter for screening, unspecified: Secondary | ICD-10-CM | POA: Diagnosis not present

## 2017-12-03 DIAGNOSIS — Z0000 Encounter for general adult medical examination without abnormal findings: Secondary | ICD-10-CM | POA: Diagnosis not present

## 2018-01-28 DIAGNOSIS — R079 Chest pain, unspecified: Secondary | ICD-10-CM | POA: Diagnosis not present

## 2018-01-28 DIAGNOSIS — F17200 Nicotine dependence, unspecified, uncomplicated: Secondary | ICD-10-CM | POA: Diagnosis not present

## 2018-01-28 DIAGNOSIS — I10 Essential (primary) hypertension: Secondary | ICD-10-CM | POA: Diagnosis not present

## 2018-01-28 DIAGNOSIS — R0789 Other chest pain: Secondary | ICD-10-CM | POA: Diagnosis not present

## 2018-05-21 DIAGNOSIS — K5731 Diverticulosis of large intestine without perforation or abscess with bleeding: Secondary | ICD-10-CM | POA: Diagnosis not present

## 2018-05-21 DIAGNOSIS — D649 Anemia, unspecified: Secondary | ICD-10-CM | POA: Diagnosis not present

## 2018-05-21 DIAGNOSIS — K5790 Diverticulosis of intestine, part unspecified, without perforation or abscess without bleeding: Secondary | ICD-10-CM | POA: Diagnosis not present

## 2018-05-21 DIAGNOSIS — K921 Melena: Secondary | ICD-10-CM | POA: Diagnosis not present

## 2018-05-21 DIAGNOSIS — K5730 Diverticulosis of large intestine without perforation or abscess without bleeding: Secondary | ICD-10-CM | POA: Diagnosis not present

## 2018-05-21 DIAGNOSIS — D62 Acute posthemorrhagic anemia: Secondary | ICD-10-CM | POA: Diagnosis not present

## 2018-05-21 DIAGNOSIS — K922 Gastrointestinal hemorrhage, unspecified: Secondary | ICD-10-CM | POA: Diagnosis not present

## 2018-05-22 DIAGNOSIS — D62 Acute posthemorrhagic anemia: Secondary | ICD-10-CM | POA: Diagnosis not present

## 2018-05-22 DIAGNOSIS — K5731 Diverticulosis of large intestine without perforation or abscess with bleeding: Secondary | ICD-10-CM | POA: Diagnosis not present

## 2018-05-22 DIAGNOSIS — K921 Melena: Secondary | ICD-10-CM | POA: Diagnosis not present

## 2018-05-22 DIAGNOSIS — K5730 Diverticulosis of large intestine without perforation or abscess without bleeding: Secondary | ICD-10-CM | POA: Diagnosis not present

## 2018-05-22 DIAGNOSIS — D649 Anemia, unspecified: Secondary | ICD-10-CM | POA: Diagnosis not present

## 2018-05-22 DIAGNOSIS — K5790 Diverticulosis of intestine, part unspecified, without perforation or abscess without bleeding: Secondary | ICD-10-CM | POA: Diagnosis not present

## 2018-05-23 DIAGNOSIS — K5790 Diverticulosis of intestine, part unspecified, without perforation or abscess without bleeding: Secondary | ICD-10-CM | POA: Diagnosis not present

## 2018-05-23 DIAGNOSIS — D649 Anemia, unspecified: Secondary | ICD-10-CM | POA: Diagnosis not present

## 2018-05-23 DIAGNOSIS — D62 Acute posthemorrhagic anemia: Secondary | ICD-10-CM | POA: Diagnosis not present

## 2018-05-23 DIAGNOSIS — K921 Melena: Secondary | ICD-10-CM | POA: Diagnosis not present

## 2018-05-23 DIAGNOSIS — K5731 Diverticulosis of large intestine without perforation or abscess with bleeding: Secondary | ICD-10-CM | POA: Diagnosis not present

## 2018-05-23 DIAGNOSIS — K5730 Diverticulosis of large intestine without perforation or abscess without bleeding: Secondary | ICD-10-CM | POA: Diagnosis not present

## 2018-06-12 DIAGNOSIS — D649 Anemia, unspecified: Secondary | ICD-10-CM | POA: Diagnosis not present

## 2018-06-12 DIAGNOSIS — R030 Elevated blood-pressure reading, without diagnosis of hypertension: Secondary | ICD-10-CM | POA: Diagnosis not present

## 2018-06-12 DIAGNOSIS — Z8719 Personal history of other diseases of the digestive system: Secondary | ICD-10-CM | POA: Diagnosis not present

## 2018-06-12 DIAGNOSIS — Z7189 Other specified counseling: Secondary | ICD-10-CM | POA: Diagnosis not present

## 2018-10-19 ENCOUNTER — Other Ambulatory Visit: Payer: Self-pay

## 2018-11-19 ENCOUNTER — Inpatient Hospital Stay (HOSPITAL_COMMUNITY)
Admission: AD | Admit: 2018-11-19 | Discharge: 2018-11-21 | DRG: 378 | Disposition: A | Discharge status: Home / Self Care | Payer: Medicare Other | Source: Other Acute Inpatient Hospital | Attending: Internal Medicine | Admitting: Internal Medicine

## 2018-11-19 ENCOUNTER — Other Ambulatory Visit: Payer: Self-pay

## 2018-11-19 DIAGNOSIS — N179 Acute kidney failure, unspecified: Secondary | ICD-10-CM | POA: Diagnosis present

## 2018-11-19 DIAGNOSIS — K922 Gastrointestinal hemorrhage, unspecified: Secondary | ICD-10-CM | POA: Diagnosis present

## 2018-11-19 DIAGNOSIS — D509 Iron deficiency anemia, unspecified: Secondary | ICD-10-CM | POA: Diagnosis present

## 2018-11-19 DIAGNOSIS — I700 Atherosclerosis of aorta: Secondary | ICD-10-CM | POA: Diagnosis not present

## 2018-11-19 DIAGNOSIS — I471 Supraventricular tachycardia: Secondary | ICD-10-CM | POA: Diagnosis present

## 2018-11-19 DIAGNOSIS — D696 Thrombocytopenia, unspecified: Secondary | ICD-10-CM | POA: Diagnosis present

## 2018-11-19 DIAGNOSIS — K219 Gastro-esophageal reflux disease without esophagitis: Secondary | ICD-10-CM | POA: Diagnosis present

## 2018-11-19 DIAGNOSIS — Z8546 Personal history of malignant neoplasm of prostate: Secondary | ICD-10-CM

## 2018-11-19 DIAGNOSIS — I129 Hypertensive chronic kidney disease with stage 1 through stage 4 chronic kidney disease, or unspecified chronic kidney disease: Secondary | ICD-10-CM | POA: Diagnosis present

## 2018-11-19 DIAGNOSIS — F17210 Nicotine dependence, cigarettes, uncomplicated: Secondary | ICD-10-CM | POA: Diagnosis present

## 2018-11-19 DIAGNOSIS — M109 Gout, unspecified: Secondary | ICD-10-CM | POA: Diagnosis present

## 2018-11-19 DIAGNOSIS — N189 Chronic kidney disease, unspecified: Secondary | ICD-10-CM | POA: Diagnosis present

## 2018-11-19 DIAGNOSIS — Z8249 Family history of ischemic heart disease and other diseases of the circulatory system: Secondary | ICD-10-CM | POA: Diagnosis not present

## 2018-11-19 DIAGNOSIS — K921 Melena: Secondary | ICD-10-CM | POA: Diagnosis not present

## 2018-11-19 DIAGNOSIS — K5731 Diverticulosis of large intestine without perforation or abscess with bleeding: Secondary | ICD-10-CM | POA: Diagnosis not present

## 2018-11-19 LAB — CBC
HCT: 42.1 % (ref 39.0–52.0)
Hemoglobin: 13.2 g/dL (ref 13.0–17.0)
MCH: 28.3 pg (ref 26.0–34.0)
MCHC: 31.4 g/dL (ref 30.0–36.0)
MCV: 90.3 fL (ref 80.0–100.0)
PLATELETS: 157 10*3/uL (ref 150–400)
RBC: 4.66 MIL/uL (ref 4.22–5.81)
RDW: 13.2 % (ref 11.5–15.5)
WBC: 6.9 10*3/uL (ref 4.0–10.5)
nRBC: 0 % (ref 0.0–0.2)

## 2018-11-19 MED ORDER — ONDANSETRON HCL 4 MG/2ML IJ SOLN: 4.0000 mg | Freq: Four times a day (QID) | INTRAMUSCULAR | Status: DC | PRN

## 2018-11-19 MED ORDER — ONDANSETRON HCL 4 MG PO TABS: 4.0000 mg | ORAL_TABLET | Freq: Four times a day (QID) | ORAL | Status: DC | PRN

## 2018-11-19 MED ORDER — LABETALOL HCL 5 MG/ML IV SOLN
10.0000 mg | INTRAVENOUS | Status: DC | PRN
  Administered 2018-11-19: 10 mg via INTRAVENOUS
  Filled 2018-11-19 (×2): qty 4

## 2018-11-19 MED ORDER — ALLOPURINOL 300 MG PO TABS
150.0000 mg | ORAL_TABLET | Freq: Every day | ORAL | Status: DC
  Administered 2018-11-20 – 2018-11-21 (×2): 150 mg via ORAL
  Filled 2018-11-19 (×2): qty 1

## 2018-11-19 MED ORDER — ACETAMINOPHEN 650 MG RE SUPP: 650.0000 mg | Freq: Four times a day (QID) | RECTAL | Status: DC | PRN

## 2018-11-19 MED ORDER — PANTOPRAZOLE SODIUM 40 MG IV SOLR
40.0000 mg | Freq: Two times a day (BID) | INTRAVENOUS | Status: DC
  Administered 2018-11-20 (×3): 40 mg via INTRAVENOUS
  Filled 2018-11-19 (×3): qty 40

## 2018-11-19 MED ORDER — ACETAMINOPHEN 325 MG PO TABS: 650.0000 mg | ORAL_TABLET | Freq: Four times a day (QID) | ORAL | Status: DC | PRN

## 2018-11-19 MED ORDER — SODIUM CHLORIDE 0.9 % IV SOLN
INTRAVENOUS | Status: DC
  Administered 2018-11-20: via INTRAVENOUS

## 2018-11-19 NOTE — H&P (Signed)
History and Physical   [facility] Admission History and Physical [employer], D.O.    Patient Name: Robert Hamilton MR#: [id number] Date of Birth: [birth year] Date of Admission: 11/19/2018  Primary Care Physician: Maryella Shivers, MD  Chief Complaint: No chief complaint on file.   HPI: Robert [facility] is a 84 y.o. male with a known history of hypertension, CKD, nephrolithiasis, GERD, diverticulosis, prostate cancer, anemia presents to the emergency department for evaluation of bleeding.  Patient states that about once a year he has an episode of rectal bleeding, last one was in June which is when he thinks he had his last colonoscopy.  He reports that he had a sudden onset of black hard stool which became soft brown and then bright red.  Just prior to coming to the emergency department at [facility] he had an episode of red watery diarrhea.  This is similar to previous episodes of bleeding diverticula.  He denies any abdominal pain, dizziness, weakness, nausea or vomiting.  He follows with Dr. Lyndel Safe from GI  EMS/ED Course: Medical admission has been requested for further management of lower GI bleeding.  Review of Systems:  CONSTITUTIONAL: No fever/chills, fatigue, weakness, weight gain/loss, headache. EYES: No blurry or double vision. ENT: No tinnitus, postnasal drip, redness or soreness of the oropharynx. RESPIRATORY: No cough, dyspnea, wheeze.  No hemoptysis.  CARDIOVASCULAR: No chest pain, palpitations, syncope, orthopnea. No lower extremity edema.  GASTROINTESTINAL: No nausea, vomiting, abdominal pain, diarrhea, constipation.  No hematemesis, positive melena and hematochezia. GENITOURINARY: No dysuria, frequency, hematuria. ENDOCRINE: No polyuria or nocturia. No heat or cold intolerance. HEMATOLOGY: No anemia, bruising, bleeding. INTEGUMENTARY: No rashes, ulcers, lesions. MUSCULOSKELETAL: No arthritis, gout,  dyspnea. NEUROLOGIC: No numbness, tingling, ataxia, seizure-type activity, weakness. PSYCHIATRIC: No anxiety, depression, insomnia.   Past Medical History:  Diagnosis Date  . Anemia   . Cancer ([place])   . Chronic kidney disease   . GERD (gastroesophageal reflux disease)   . Hypertension     Past surgical history significant for hernia repair the right inguinal hernia and TURP  Family history significant for mother with an MI in her 53s  Social history significant for less than 5 cigarettes/day, denies illicit drug use or alcohol use    reports that he has been smoking cigarettes. He has never used smokeless tobacco. He reports current alcohol use. He reports that he does not use drugs.  No Known Allergies  Prior to Admission medications   Medication Sig Start Date End Date Taking? Authorizing Provider  allopurinol (ZYLOPRIM) 300 MG tablet Take 150 mg by mouth daily.   Yes [provider]  ferrous sulfate 325 (65 FE) MG tablet Take 325 mg by mouth 2 (two) times daily with a meal.   Yes [provider]    Physical Exam: Vitals:   11/19/18 2137  BP: (!) 180/98  Pulse: 77  Resp: 20  Temp: 97.7 F (36.5 C)  TempSrc: Oral  SpO2: 99%  Weight: 67.9 kg  Height: 5\' 9"  (1.753 m)    GENERAL: 84 y.o.-year-old African-American patient, well-developed, well-nourished lying in the bed in no acute distress.  Pleasant and cooperative.   HEENT: Head atraumatic, normocephalic. Pupils equal. Mucus membranes moist. NECK: Supple. No JVD. CHEST: Normal breath sounds bilaterally. No wheezing, rales, rhonchi or crackles. No use of accessory muscles of respiration.  No reproducible chest wall tenderness.  CARDIOVASCULAR: S1, S2 normal. No murmurs, rubs, or gallops. Cap refill <2 seconds. Pulses  intact distally.  ABDOMEN: Soft, nondistended, nontender. No rebound, guarding, rigidity. Normoactive bowel sounds present in all four quadrants.  EXTREMITIES: No pedal edema, cyanosis,  or clubbing. No calf tenderness or Homan's sign.  NEUROLOGIC: The patient is alert and oriented x 3. Cranial nerves II through XII are grossly intact with no focal sensorimotor deficit. PSYCHIATRIC:  Normal affect, mood, thought content. SKIN: Warm, dry, and intact without obvious rash, lesion, or ulcer.    Labs on Admission:  Blood cell count 6.2 hemoglobin 14.1 hematocrit 43.4 platelets 157 sodium 139 potassium 4.5 chloride 106 CO2 27 BUN 16 creatinine 1.4 glucose 129  Radiological Exams on Admission: CT of the abdomen and pelvis done at [facility] was negative for any active extravasation into the bowel, aortic atherosclerosis, extensive colonic diverticulosis and no other acute findings  EKG: Normal sinus rhythm at 95 bpm with normal axis and nonspecific ST-T wave changes.   Assessment/Plan  This is a 84 y.o. male with a history of hypertension, CKD, nephrolithiasis, GERD, diverticulosis, prostate cancer, anemia now being admitted with:  #. Lower GI Bleed -IV Protonix 40mg  BID -Serial CBCs -Nothing by mouth -IV fluid hydration -Hold anticoagulants -GI consultation for a.m.   #. Renal insufficiency - IV fluids - Recheck in AM  #.  History of iron deficiency anemia Continue ferrous sulfate  #. History of gout  - Continue allopurinol  Admission status: Observation IV Fluids: Normal saline Diet/Nutrition: N.p.o. Consults called: GI to be called in a.m. DVT Px: SCDs and early ambulation. Code Status: Full Code  Disposition Plan: To home in 1-2 days  All the records are reviewed and case discussed with ED provider. Management plans discussed with the patient and/or family who express understanding and agree with plan of care.  Saddie Sandeen D.O. on 11/19/2018 at 10:30 PM CC: Primary care physician; Maryella Shivers, MD   11/19/2018, 10:30 PM

## 2018-11-20 ENCOUNTER — Encounter (HOSPITAL_COMMUNITY): Payer: Self-pay | Admitting: Emergency Medicine

## 2018-11-20 DIAGNOSIS — K5731 Diverticulosis of large intestine without perforation or abscess with bleeding: Secondary | ICD-10-CM

## 2018-11-20 DIAGNOSIS — K922 Gastrointestinal hemorrhage, unspecified: Secondary | ICD-10-CM

## 2018-11-20 DIAGNOSIS — K921 Melena: Principal | ICD-10-CM

## 2018-11-20 LAB — CBC
HCT: 39.2 % (ref 39.0–52.0)
HCT: 39.4 % (ref 39.0–52.0)
HCT: 40.4 % (ref 39.0–52.0)
HCT: 37.2 % — ABNORMAL LOW (ref 39.0–52.0)
Hemoglobin: 12.2 g/dL — ABNORMAL LOW (ref 13.0–17.0)
Hemoglobin: 12.3 g/dL — ABNORMAL LOW (ref 13.0–17.0)
Hemoglobin: 12.4 g/dL — ABNORMAL LOW (ref 13.0–17.0)
Hemoglobin: 11.6 g/dL — ABNORMAL LOW (ref 13.0–17.0)
MCH: 28.4 pg (ref 26.0–34.0)
MCH: 28.3 pg (ref 26.0–34.0)
MCH: 28.1 pg (ref 26.0–34.0)
MCH: 28.2 pg (ref 26.0–34.0)
MCHC: 31.1 g/dL (ref 30.0–36.0)
MCHC: 31.2 g/dL (ref 30.0–36.0)
MCHC: 30.7 g/dL (ref 30.0–36.0)
MCHC: 31.2 g/dL (ref 30.0–36.0)
MCV: 91.2 fL (ref 80.0–100.0)
MCV: 90.8 fL (ref 80.0–100.0)
MCV: 91.6 fL (ref 80.0–100.0)
MCV: 90.5 fL (ref 80.0–100.0)
NRBC: 0 % (ref 0.0–0.2)
Platelets: 113 10*3/uL — ABNORMAL LOW (ref 150–400)
Platelets: 126 10*3/uL — ABNORMAL LOW (ref 150–400)
Platelets: 147 10*3/uL — ABNORMAL LOW (ref 150–400)
Platelets: 120 10*3/uL — ABNORMAL LOW (ref 150–400)
RBC: 4.3 MIL/uL (ref 4.22–5.81)
RBC: 4.34 MIL/uL (ref 4.22–5.81)
RBC: 4.41 MIL/uL (ref 4.22–5.81)
RBC: 4.11 MIL/uL — ABNORMAL LOW (ref 4.22–5.81)
RDW: 13.3 % (ref 11.5–15.5)
RDW: 13.3 % (ref 11.5–15.5)
RDW: 13.2 % (ref 11.5–15.5)
RDW: 13.2 % (ref 11.5–15.5)
WBC: 6 10*3/uL (ref 4.0–10.5)
WBC: 5.2 10*3/uL (ref 4.0–10.5)
WBC: 6.1 10*3/uL (ref 4.0–10.5)
WBC: 5.5 10*3/uL (ref 4.0–10.5)
nRBC: 0 % (ref 0.0–0.2)
nRBC: 0 % (ref 0.0–0.2)
nRBC: 0 % (ref 0.0–0.2)

## 2018-11-20 LAB — BASIC METABOLIC PANEL
Anion gap: 10 (ref 5–15)
BUN: 14 mg/dL (ref 8–23)
CO2: 24 mmol/L (ref 22–32)
Calcium: 8.6 mg/dL — ABNORMAL LOW (ref 8.9–10.3)
Chloride: 108 mmol/L (ref 98–111)
Creatinine, Ser: 1.16 mg/dL (ref 0.61–1.24)
GFR calc Af Amer: >60 mL/min (ref >60)
GFR calc non Af Amer: 58 mL/min — ABNORMAL LOW (ref >60)
Glucose, Bld: 113 mg/dL — ABNORMAL HIGH (ref 70–99)
Potassium: 3.6 mmol/L (ref 3.5–5.1)
SODIUM: 142 mmol/L (ref 135–145)

## 2018-11-20 NOTE — Progress Notes (Signed)
Patient had 5 beat run VTach.  Asymptomatic.  VS obtained.  BP elevated.  MD notified via text page. Patient resting in bed.  Voices no complaints.  States, "I feel fine."

## 2018-11-20 NOTE — Consult Note (Addendum)
Consultation  Referring Provider: [facility] / [place]  Primary Care Physician:  Maryella Shivers, MD Primary Gastroenterologist:  Dr.Gupta ?  Reason for Consultation:  GI bleed  HPI: [facility] is a 84 y.o. male, who was admitted last night after he had presented to [facility] with GI bleeding.  Decision was made to transfer him here for admission. Patient states that Dr. Lyndel Safe has been his GI physician he cannot recall how many years ago he was last seen or when he last had a colonoscopy.  He thinks this may have been a few years ago when he was admitted with similar GI bleeding. Patient relates having admission at [facility] summer 2019 again for rectal bleeding.  After I prompted him with names he believes he was seen by Dr. Lyda Jester during that admission but did not require colonoscopy.  He says the bleeding stopped by itself he did not require transfusion.  Patient had acute onset yesterday morning with a black stool followed by aggressively more bloody stool with bright red blood.  He had several episodes prior to presenting to the emergency room.  He denies any abdominal pain or cramping, no nausea or vomiting, no recent changes in bowel habits.  Episode was not associated with weakness or dizziness. Has not had any further bleeding since admission here.  He did have one small bowel movement this morning which she says was formed and very dark.  Patient is not on any regular aspirin or NSAIDs. He has history of hypertension, chronic kidney disease, GERD, diverticulosis and history of prostate cancer for which he underwent TURP.  He did not have radiation therapy.  On arrival last evening hemoglobin was 13 hematocrit of 42, serial labs have been done and this morning hemoglobin 12.3 hematocrit of 39.4 platelets 126.,  BUN 14, creatinine 1.16  [facility] records are not available in the epic system.   Past Medical History:  Diagnosis Date  .  Anemia   . Cancer ([place])   . Chronic kidney disease   . GERD (gastroesophageal reflux disease)   . Hypertension      Prior to Admission medications   Medication Sig Start Date End Date Taking? Authorizing Provider  allopurinol (ZYLOPRIM) 300 MG tablet Take 150 mg by mouth daily.   Yes [provider]  ferrous sulfate 325 (65 FE) MG tablet Take 325 mg by mouth 2 (two) times daily with a meal.   Yes [provider]    Current Facility-Administered Medications  Medication Dose Route Frequency Provider Last Rate Last Dose  . 0.9 %  sodium chloride infusion   Intravenous Continuous Hugelmeyer, Alexis, DO 75 mL/hr at 11/20/18 0020    . acetaminophen (TYLENOL) tablet 650 mg  650 mg Oral Q6H PRN Hugelmeyer, Alexis, DO       Or  . acetaminophen (TYLENOL) suppository 650 mg  650 mg Rectal Q6H PRN Hugelmeyer, Alexis, DO      . allopurinol (ZYLOPRIM) tablet 150 mg  150 mg Oral Daily Hugelmeyer, Alexis, DO      . labetalol (NORMODYNE,TRANDATE) injection 10 mg  10 mg Intravenous Q2H PRN Hugelmeyer, Alexis, DO   10 mg at 11/19/18 2249  . ondansetron (ZOFRAN) tablet 4 mg  4 mg Oral Q6H PRN Hugelmeyer, Alexis, DO       Or  . ondansetron (ZOFRAN) injection 4 mg  4 mg Intravenous Q6H PRN Hugelmeyer, Alexis, DO      . pantoprazole (PROTONIX) injection 40 mg  40 mg  Intravenous Q12H Hugelmeyer, Alexis, DO   40 mg at 11/20/18 1102    Allergies as of 11/19/2018  . (No Known Allergies)    No family history on file.  Social History   Socioeconomic History  . Marital status: Divorced    Spouse name: Not on file  . Number of children: Not on file  . Years of education: Not on file  . Highest education level: Not on file  Occupational History  . Not on file  Social Needs  . Financial resource strain: Not on file  . Food insecurity:    Worry: Not on file    Inability: Not on file  . Transportation needs:    Medical: Not on file    Non-medical: Not on file  Tobacco Use  .  Smoking status: Current Every Day Smoker    Types: Cigarettes  . Smokeless tobacco: Never Used  Substance and Sexual Activity  . Alcohol use: Yes  . Drug use: No  . Sexual activity: Yes  Lifestyle  . Physical activity:    Days per week: Not on file    Minutes per session: Not on file  . Stress: Not on file  Relationships  . Social connections:    Talks on phone: Not on file    Gets together: Not on file    Attends religious service: Not on file    Active member of club or organization: Not on file    Attends meetings of clubs or organizations: Not on file    Relationship status: Not on file  . Intimate partner violence:    Fear of current or ex partner: Not on file    Emotionally abused: Not on file    Physically abused: Not on file    Forced sexual activity: Not on file  Other Topics Concern  . Not on file  Social History Narrative  . Not on file    Review of Systems: Pertinent positive and negative review of systems were noted in the above HPI section.  All other review of systems was otherwise negative.  Physical Exam: Vital signs in last 24 hours: Temp:  [97.7 F (36.5 C)-98 F (36.7 C)] 98 F (36.7 C) (12/20 0507) Pulse Rate:  [77-93] 93 (12/20 0507) Resp:  [20] 20 (12/20 0507) BP: (150-189)/(89-103) 164/89 (12/20 0507) SpO2:  [99 %-100 %] 100 % (12/20 0507) Weight:  [67.9 kg] 67.9 kg (12/19 2137) Last BM Date: 11/20/18 General:   Alert,  Well-developed, well-nourished, elderly AA male  pleasant and cooperative in NAD, family in room Head:  Normocephalic and atraumatic. Eyes:  Sclera clear, no icterus.   Conjunctiva pink. Ears:  Normal auditory acuity. Nose:  No deformity, discharge,  or lesions. Mouth:  No deformity or lesions.   Neck:  Supple; no masses or thyromegaly. Lungs:  Clear throughout to auscultation.   No wheezes, crackles, or rhonchi. Heart:  Regular rate and rhythm; no murmurs, clicks, rubs,  or gallops. Abdomen:  Soft,nontender, BS  active,nonpalp mass or hsm. Low midline incisional scar  Rectal:  Deferred  Msk:  Symmetrical without gross deformities. . Pulses:  Normal pulses noted. Extremities:  Without clubbing or edema. Neurologic:  Alert and  oriented x4;  grossly normal neurologically. Skin:  Intact without significant lesions or rashes.. Psych:  Alert and cooperative. Normal mood and affect.  Intake/Output from previous day: 12/19 0701 - 12/20 0700 In: 358.5 [I.V.:358.5] Out: 300 [Urine:300] Intake/Output this shift: No intake/output data recorded.  Lab Results: Recent Labs  11/19/18 2341 11/20/18 0349 11/20/18 0739  WBC 6.9 6.0 5.2  HGB 13.2 12.2* 12.3*  HCT 42.1 39.2 39.4  PLT 157 113* 126*   BMET Recent Labs    11/20/18 0349  NA 142  K 3.6  CL 108  CO2 24  GLUCOSE 113*  BUN 14  CREATININE 1.16  CALCIUM 8.6*   LFT No results for input(s): PROT, ALBUMIN, AST, ALT, ALKPHOS, BILITOT, BILIDIR, IBILI in the last 72 hours. PT/INR No results for input(s): LABPROT, INR in the last 72 hours. Hepatitis Panel No results for input(s): HEPBSAG, HCVAB, HEPAIGM, HEPBIGM in the last 72 hours.    IMPRESSION:   #24 84 year old African-American male with history of recurrent lower GI bleeding by history reported to be diverticular.  Patient admitted last evening in transfer from [facility] after he had acute onset yesterday morning of painless rectal bleeding with bright red blood. Patient had several episodes prior to admission, but has not had any active bleeding overnight or this morning. He did have a 1 g drop in hemoglobin. He has been hemodynamically stable and feels fine.  Etiology of bleeding is not clear, though very consistent with recurrent diverticular hemorrhage, self limited.  #2 mild anemia normocytic secondary to above #3 thrombocytopenia #4 history of chronic kidney disease #5.  History of prostate cancer status post TUR B/no radiation #6 history of gout #7.   Hypertension  Plan;Start full liquid diet Continue serial hemoglobins transfuse for hemoglobin less than 8 We will request his records from [facility] and prior colonoscopies.  Hopefully he will not require colonoscopy this admission, if last procedure has been done within the past few years.  Thank you we will follow with you   Addendum - able to pull up prior Colon reports in Dr Steve Rattler old records - Colonoscopy 01/2015 - pan-diverticulosis,otherwise normal . Pt had had bleed at that time . Colonoscopy 05/2009 - also done for bleed - extensive pan-diverticulosis, int hemorrhoid, and sigmoid tubular  adenoma     Amy Esterwood PA-C /[facility]  11/20/2018, 11:48 AM

## 2018-11-20 NOTE — Progress Notes (Signed)
PROGRESS NOTE    Robert Hamilton  PPI:[id number] DOB: 1934-02-09 DOA: 11/19/2018 PCP: Maryella Shivers, MD    Brief Narrative: Robert Hamilton is a 84 y.o. male with a known history of hypertension, CKD, nephrolithiasis, GERD, diverticulosis, prostate cancer, anemia presents to the emergency department for evaluation of bleeding. report black stool and subsequently bright red blood in the stool.    Assessment & Plan:   Active Problems:   Lower GI bleeding  1-GI Bleed;  Continue with Protonix.  Cycle hb.  GI consulted.   AKI;  Continue with IV fluids.   Iron deficiency;  Hold iron.   History gout; continue with allopurinol.   RN Pressure Injury Documentation:    Malnutrition Type:      Malnutrition Characteristics:      Nutrition Interventions:     Estimated body mass index is 22.11 kg/m as calculated from the following:   Height as of this encounter: 5\' 9"  (1.753 m).   Weight as of this encounter: 67.9 kg.   DVT prophylaxis: (Lovenox/Heparin/SCD's/anticoagulated/None (if comfort care) Code Status: (Full/Partial - specify details) Family Communication: (Specify name, relationship & date discussed. NO "discussed with patient") Disposition Plan: (specify when and where you expect patient to be discharged). Include barriers to DC in this tab.   Consultants:  GI  Procedures: none  Antimicrobials: none  Subjective: Report 3 small ball black stool   Objective: Vitals:   11/19/18 2250 11/19/18 2333 11/20/18 0507 11/20/18 1445  BP: (!) 189/103 (!) 150/90 (!) 164/89 (!) 142/87  Pulse:  85 93 93  Resp:   20 18  Temp:   98 F (36.7 C) 98.2 F (36.8 C)  TempSrc:   Oral Oral  SpO2:   100% 97%  Weight:      Height:        Intake/Output Summary (Last 24 hours) at 11/20/2018 1505 Last data filed at 11/20/2018 0509 Gross per 24 hour  Intake 358.49 ml  Output 300 ml  Net 58.49 ml   Filed Weights   11/19/18 2137  Weight: 67.9 kg     Examination:  General exam: Appears calm and comfortable  Respiratory system: Clear to auscultation. Respiratory effort normal. Cardiovascular system: S1 & S2 heard, RRR. No JVD, murmurs, rubs, gallops or clicks. No pedal edema. Gastrointestinal system: Abdomen is nondistended, soft and nontender. No organomegaly or masses felt. Normal bowel sounds heard. Central nervous system: Alert and oriented. No focal neurological deficits. Extremities: Symmetric 5 x 5 power. Skin: No rashes, lesions or ulcers Psychiatry: Judgement and insight appear normal. Mood & affect appropriate.     Data Reviewed: I have personally reviewed following labs and imaging studies  CBC: Recent Labs  Lab 11/19/18 2341 11/20/18 0349 11/20/18 0739 11/20/18 1155  WBC 6.9 6.0 5.2 6.1  HGB 13.2 12.2* 12.3* 12.4*  HCT 42.1 39.2 39.4 40.4  MCV 90.3 91.2 90.8 91.6  PLT 157 113* 126* 063*   Basic Metabolic Panel: Recent Labs  Lab 11/20/18 0349  NA 142  K 3.6  CL 108  CO2 24  GLUCOSE 113*  BUN 14  CREATININE 1.16  CALCIUM 8.6*   GFR: Estimated Creatinine Clearance: 47.2 mL/min (by C-G formula based on SCr of 1.16 mg/dL). Liver Function Tests: No results for input(s): AST, ALT, ALKPHOS, BILITOT, PROT, ALBUMIN in the last 168 hours. No results for input(s): LIPASE, AMYLASE in the last 168 hours. No results for input(s): AMMONIA in the last 168 hours. Coagulation Profile: No results for input(s): INR,  PROTIME in the last 168 hours. Cardiac Enzymes: No results for input(s): CKTOTAL, CKMB, CKMBINDEX, TROPONINI in the last 168 hours. BNP (last 3 results) No results for input(s): PROBNP in the last 8760 hours. HbA1C: No results for input(s): HGBA1C in the last 72 hours. CBG: No results for input(s): GLUCAP in the last 168 hours. Lipid Profile: No results for input(s): CHOL, HDL, LDLCALC, TRIG, CHOLHDL, LDLDIRECT in the last 72 hours. Thyroid Function Tests: No results for input(s): TSH, T4TOTAL,  FREET4, T3FREE, THYROIDAB in the last 72 hours. Anemia Panel: No results for input(s): VITAMINB12, FOLATE, FERRITIN, TIBC, IRON, RETICCTPCT in the last 72 hours. Sepsis Labs: No results for input(s): PROCALCITON, LATICACIDVEN in the last 168 hours.  No results found for this or any previous visit (from the past 240 hour(s)).       Radiology Studies: No results found.      Scheduled Meds: . allopurinol  150 mg Oral Daily  . pantoprazole (PROTONIX) IV  40 mg Intravenous Q12H   Continuous Infusions: . sodium chloride 75 mL/hr at 11/20/18 0020     LOS: 1 day    Time spent: 35 minutes.     Elmarie Shiley, MD [facility] Pager [phone]  If 7PM-7AM, please contact night-coverage [email or web address] Password [facility] 11/20/2018, 3:05 PM

## 2018-11-20 NOTE — Evaluation (Signed)
Physical Therapy Evaluation Patient Details Name: Robert Hamilton MRN: [id number] DOB: May 03, 1934 Today's Date: 11/20/2018   History of Present Illness  84 yo male adm with lower  GI bleed; PMHx: anemia, HTN  Clinical Impression  Patient evaluated by Physical Therapy with no further acute PT needs identified. All education has been completed and the patient has no further questions.  See below for any follow-up Physical Therapy or equipment needs. PT is signing off. Thank you for this referral.     Follow Up Recommendations No PT follow up    Equipment Recommendations  Other (comment)    Recommendations for Other Services       Precautions / Restrictions Precautions Precautions: Fall      Mobility  Bed Mobility Overal bed mobility: Independent                Transfers Overall transfer level: Independent                  Ambulation/Gait Ambulation/Gait assistance: Independent Gait Distance (Feet): 300 Feet Assistive device: None Gait Pattern/deviations: Step-through pattern;WFL(Within Functional Limits)     General Gait Details: non LOB, steady gait without UE support  Stairs            Wheelchair Mobility    Modified Rankin (Stroke Patients Only)       Balance Overall balance assessment: (denies any falls)   Sitting balance-Leahy Scale: Normal       Standing balance-Leahy Scale: Good               High level balance activites: Direction changes;Turns;Head turns;Side stepping;Backward walking High Level Balance Comments: no LOB  above, occasional drifting R/L initially but no significant deficits noted             Pertinent Vitals/Pain Pain Assessment: No/denies pain    Home Living Family/patient expects to be discharged to:: Private residence Living Arrangements: Alone   Type of Home: Apartment Home Access: Level entry     Home Layout: One level Home Equipment: None      Prior Function Level of  Independence: Independent         Comments: pt is quite independent at his baseline, has lived alone greater than 50 years      Hand Dominance        Extremity/Trunk Assessment   Upper Extremity Assessment Upper Extremity Assessment: Overall WFL for tasks assessed    Lower Extremity Assessment Lower Extremity Assessment: Overall WFL for tasks assessed       Communication   Communication: No difficulties  Cognition Arousal/Alertness: Awake/alert Behavior During Therapy: WFL for tasks assessed/performed Overall Cognitive Status: Within Functional Limits for tasks assessed                                        General Comments      Exercises     Assessment/Plan    PT Assessment Patent does not need any further PT services  PT Problem List         PT Treatment Interventions      PT Goals (Current goals can be found in the Care Plan section)  Acute Rehab PT Goals PT Goal Formulation: All assessment and education complete, DC therapy    Frequency     Barriers to discharge        Co-evaluation  AM-PAC PT "6 Clicks" Mobility  Outcome Measure Help needed turning from your back to your side while in a flat bed without using bedrails?: None Help needed moving from lying on your back to sitting on the side of a flat bed without using bedrails?: None Help needed moving to and from a bed to a chair (including a wheelchair)?: None Help needed standing up from a chair using your arms (e.g., wheelchair or bedside chair)?: None Help needed to walk in hospital room?: None Help needed climbing 3-5 steps with a railing? : None 6 Click Score: 24    End of Session Equipment Utilized During Treatment: Gait belt Activity Tolerance: Patient tolerated treatment well Patient left: in bed;with call bell/phone within reach   PT Visit Diagnosis: Difficulty in walking, not elsewhere classified (R26.2)    Time: 1026-1040 PT Time  Calculation (min) (ACUTE ONLY): 14 min   Charges:   PT Evaluation $PT Eval Low Complexity: 1 Low          [name], PT  Pager: [phone] Acute Rehab Dept [facility]): [phone]   11/20/2018   [facility] 11/20/2018, 10:52 AM

## 2018-11-21 LAB — MAGNESIUM: Magnesium: 2 mg/dL (ref 1.7–2.4)

## 2018-11-21 LAB — CBC
HCT: 35.4 % — ABNORMAL LOW (ref 39.0–52.0)
HCT: 36.6 % — ABNORMAL LOW (ref 39.0–52.0)
Hemoglobin: 11.1 g/dL — ABNORMAL LOW (ref 13.0–17.0)
Hemoglobin: 11.4 g/dL — ABNORMAL LOW (ref 13.0–17.0)
MCH: 29 pg (ref 26.0–34.0)
MCH: 28.9 pg (ref 26.0–34.0)
MCHC: 31.4 g/dL (ref 30.0–36.0)
MCHC: 31.1 g/dL (ref 30.0–36.0)
MCV: 92.4 fL (ref 80.0–100.0)
MCV: 92.9 fL (ref 80.0–100.0)
NRBC: 0 % (ref 0.0–0.2)
Platelets: 116 10*3/uL — ABNORMAL LOW (ref 150–400)
Platelets: 123 10*3/uL — ABNORMAL LOW (ref 150–400)
RBC: 3.83 MIL/uL — ABNORMAL LOW (ref 4.22–5.81)
RBC: 3.94 MIL/uL — ABNORMAL LOW (ref 4.22–5.81)
RDW: 13.2 % (ref 11.5–15.5)
RDW: 13.2 % (ref 11.5–15.5)
WBC: 5.5 10*3/uL (ref 4.0–10.5)
WBC: 5.2 10*3/uL (ref 4.0–10.5)
nRBC: 0 % (ref 0.0–0.2)

## 2018-11-21 LAB — BASIC METABOLIC PANEL
Anion gap: 7 (ref 5–15)
BUN: 10 mg/dL (ref 8–23)
CALCIUM: 8.3 mg/dL — AB (ref 8.9–10.3)
CO2: 21 mmol/L — ABNORMAL LOW (ref 22–32)
Chloride: 111 mmol/L (ref 98–111)
Creatinine, Ser: 1.21 mg/dL (ref 0.61–1.24)
GFR calc Af Amer: >60 mL/min (ref >60)
GFR calc non Af Amer: 55 mL/min — ABNORMAL LOW (ref >60)
Glucose, Bld: 105 mg/dL — ABNORMAL HIGH (ref 70–99)
Potassium: 3.8 mmol/L (ref 3.5–5.1)
SODIUM: 139 mmol/L (ref 135–145)

## 2018-11-21 MED ORDER — PANTOPRAZOLE SODIUM 40 MG PO TBEC
40.0000 mg | DELAYED_RELEASE_TABLET | Freq: Every day | ORAL | Status: DC
  Administered 2018-11-21: 40 mg via ORAL
  Filled 2018-11-21: qty 1

## 2018-11-21 MED ORDER — PANTOPRAZOLE SODIUM 40 MG PO TBEC: 40.0000 mg | DELAYED_RELEASE_TABLET | Freq: Every day | ORAL | 0 refills | Status: DC

## 2018-11-21 NOTE — Discharge Summary (Signed)
Physician Discharge Summary  Robert Hamilton GYB:[id number] DOB: 1933-12-22 DOA: 11/19/2018  PCP: Maryella Shivers, MD  Admit date: 11/19/2018 Discharge date: 11/21/2018  Admitted From: Home  Disposition: Home   Recommendations for Outpatient Follow-up:  1. Follow up with PCP in 1-2 weeks 2. Please obtain BMP/CBC in one week 3. Follow up with GI for further discussion of Colonoscopy   Discharge Condition: Stable.  CODE STATUS:full code Diet recommendation: Heart Healthy   Brief/Interim Summary: Brief Narrative: [name] Jris a 84 y.o.malewith a known history of hypertension, CKD, nephrolithiasis, GERD, diverticulosis, prostate cancer, anemiapresents to the emergency department for evaluation of bleeding.report black stool and subsequently bright red blood in the stool.    Assessment & Plan:   Active Problems:   Lower GI bleeding  1-GI Bleed;  Continue with Protonix.  Cycle hb.  GI consulted.  hb remain stable. No further bleeding.  No further evaluation in patient.  Home today.  Follow up with GI out patient.    AKI;  Continue with IV fluids.  resolved.   Iron deficiency;  Hold iron.  SVT; resolved. Electrolytes normal.   History gout; continue with allopurinol.   Discharge Diagnoses:  Active Problems:   Lower GI bleeding    Discharge Instructions  Discharge Instructions    Diet - low sodium heart healthy   Complete by:  As directed    Increase activity slowly   Complete by:  As directed      Allergies as of 11/21/2018   No Known Allergies     Medication List    TAKE these medications   allopurinol 300 MG tablet Commonly known as:  ZYLOPRIM Take 150 mg by mouth daily.   ferrous sulfate 325 (65 FE) MG tablet Take 325 mg by mouth 2 (two) times daily with a meal.   pantoprazole 40 MG tablet Commonly known as:  PROTONIX Take 1 tablet (40 mg total) by mouth daily.       No Known  Allergies  Consultations:  GI   Procedures/Studies:  No results found.   Subjective: Feeling well , no BM   Discharge Exam: Vitals:   11/20/18 2109 11/21/18 0511  BP: (!) 148/99 130/85  Pulse: 86 75  Resp: 18 16  Temp: 98.2 F (36.8 C) 98.7 F (37.1 C)  SpO2: 100% 95%   Vitals:   11/20/18 1445 11/20/18 1903 11/20/18 2109 11/21/18 0511  BP: (!) 142/87 (!) 161/91 (!) 148/99 130/85  Pulse: 93 90 86 75  Resp: 18 16 18 16   Temp: 98.2 F (36.8 C)  98.2 F (36.8 C) 98.7 F (37.1 C)  TempSrc: Oral  Oral Oral  SpO2: 97% 99% 100% 95%  Weight:      Height:        General: Pt is alert, awake, not in acute distress Cardiovascular: RRR, S1/S2 +, no rubs, no gallops Respiratory: CTA bilaterally, no wheezing, no rhonchi Abdominal: Soft, NT, ND, bowel sounds + Extremities: no edema, no cyanosis    The results of significant diagnostics from this hospitalization (including imaging, microbiology, ancillary and laboratory) are listed below for reference.     Microbiology: No results found for this or any previous visit (from the past 240 hour(s)).   Labs: BNP (last 3 results) No results for input(s): BNP in the last 8760 hours. Basic Metabolic Panel: Recent Labs  Lab 11/20/18 0349  NA 142  K 3.6  CL 108  CO2 24  GLUCOSE 113*  BUN 14  CREATININE 1.16  CALCIUM  8.6*   Liver Function Tests: No results for input(s): AST, ALT, ALKPHOS, BILITOT, PROT, ALBUMIN in the last 168 hours. No results for input(s): LIPASE, AMYLASE in the last 168 hours. No results for input(s): AMMONIA in the last 168 hours. CBC: Recent Labs  Lab 11/20/18 0349 11/20/18 0739 11/20/18 1155 11/20/18 1902 11/21/18 0429  WBC 6.0 5.2 6.1 5.5 5.5  HGB 12.2* 12.3* 12.4* 11.6* 11.1*  HCT 39.2 39.4 40.4 37.2* 35.4*  MCV 91.2 90.8 91.6 90.5 92.4  PLT 113* 126* 147* 120* 116*   Cardiac Enzymes: No results for input(s): CKTOTAL, CKMB, CKMBINDEX, TROPONINI in the last 168  hours. BNP: Invalid input(s): POCBNP CBG: No results for input(s): GLUCAP in the last 168 hours. D-Dimer No results for input(s): DDIMER in the last 72 hours. Hgb A1c No results for input(s): HGBA1C in the last 72 hours. Lipid Profile No results for input(s): CHOL, HDL, LDLCALC, TRIG, CHOLHDL, LDLDIRECT in the last 72 hours. Thyroid function studies No results for input(s): TSH, T4TOTAL, T3FREE, THYROIDAB in the last 72 hours.  Invalid input(s): FREET3 Anemia work up No results for input(s): VITAMINB12, FOLATE, FERRITIN, TIBC, IRON, RETICCTPCT in the last 72 hours. Urinalysis No results found for: COLORURINE, APPEARANCEUR, LABSPEC, [place], GLUCOSEU, HGBUR, BILIRUBINUR, KETONESUR, PROTEINUR, UROBILINOGEN, NITRITE, LEUKOCYTESUR Sepsis Labs Invalid input(s): PROCALCITONIN,  WBC,  LACTICIDVEN Microbiology No results found for this or any previous visit (from the past 240 hour(s)).   Time coordinating discharge: 35 minutes   SIGNED:   Elmarie Shiley, MD  [facility] 11/21/2018, 10:25 AM Pager   If 7PM-7AM, please contact night-coverage [email or web address] Password [email or web address]

## 2018-11-21 NOTE — Progress Notes (Signed)
Patient ID: Robert Hamilton, male   DOB: 11-Jul-1934, 84 y.o.   MRN: [id number]    Progress Note   Subjective    Feels fine, no further bleeding or Bm's overnight HGB 11.4  Anticipating going home this afternoon  Last Colonoscopy per Dr Lyndel Safe 11/2015 at [facility] - done for bleeding with extensive pan-diverticulosis , no polyps   Objective   Vital signs in last 24 hours: Temp:  [98.2 F (36.8 C)-98.7 F (37.1 C)] 98.7 F (37.1 C) (12/21 0511) Pulse Rate:  [75-93] 75 (12/21 0511) Resp:  [16-18] 16 (12/21 0511) BP: (130-161)/(85-99) 130/85 (12/21 0511) SpO2:  [95 %-100 %] 95 % (12/21 0511) Last BM Date: 11/20/18 General:    Elderly AA male  in NAD Heart:  Regular rate and rhythm; no murmurs Lungs: Respirations even and unlabored, lungs CTA bilaterally Abdomen:  Soft, nontender and nondistended. Normal bowel sounds. Extremities:  Without edema. Neurologic:  Alert and oriented,  grossly normal neurologically. Psych:  Cooperative. Normal mood and affect.  Intake/Output from previous day: 12/20 0701 - 12/21 0700 In: 1873.1 [P.O.:240; I.V.:1633.1] Out: -  Intake/Output this shift: Total I/O In: -  Out: 125 [Urine:125]  Lab Results: Recent Labs    11/20/18 1902 11/21/18 0429 11/21/18 1114  WBC 5.5 5.5 5.2  HGB 11.6* 11.1* 11.4*  HCT 37.2* 35.4* 36.6*  PLT 120* 116* 123*   BMET Recent Labs    11/20/18 0349  NA 142  K 3.6  CL 108  CO2 24  GLUCOSE 113*  BUN 14  CREATININE 1.16  CALCIUM 8.6*        Assessment / Plan:    #1 84 yo AA male with hx of recurrent diverticular bleeding, and previously documented pan-diverticulosis  With last Colon 11/2015   Pt admitted 12/19  after acute onset of painless BRB per rectum. Bleed resolved with 24 hours , no bleeding past 24 hours  HGB stable   Pt is stable  for discharge to home today  Gradually increase activity   Avoid  ASA and NSAIDS  GI F/U with Dr Lyndel Safe at [facility] /68 office as  needed        Active Problems:   Lower GI bleeding     LOS: 2 days      11/21/2018, 12:15 PM

## 2018-12-04 DIAGNOSIS — Z1331 Encounter for screening for depression: Secondary | ICD-10-CM | POA: Diagnosis not present

## 2018-12-04 DIAGNOSIS — Z139 Encounter for screening, unspecified: Secondary | ICD-10-CM | POA: Diagnosis not present

## 2018-12-04 DIAGNOSIS — K922 Gastrointestinal hemorrhage, unspecified: Secondary | ICD-10-CM | POA: Diagnosis not present

## 2018-12-04 DIAGNOSIS — Z6822 Body mass index (BMI) 22.0-22.9, adult: Secondary | ICD-10-CM | POA: Diagnosis not present

## 2018-12-17 DIAGNOSIS — K219 Gastro-esophageal reflux disease without esophagitis: Secondary | ICD-10-CM

## 2018-12-17 DIAGNOSIS — S0990XA Unspecified injury of head, initial encounter: Secondary | ICD-10-CM | POA: Diagnosis not present

## 2018-12-17 DIAGNOSIS — R000 Tachycardia, unspecified: Secondary | ICD-10-CM | POA: Diagnosis not present

## 2018-12-17 DIAGNOSIS — I447 Left bundle-branch block, unspecified: Secondary | ICD-10-CM | POA: Diagnosis not present

## 2018-12-17 DIAGNOSIS — R410 Disorientation, unspecified: Secondary | ICD-10-CM | POA: Diagnosis not present

## 2018-12-17 DIAGNOSIS — I214 Non-ST elevation (NSTEMI) myocardial infarction: Secondary | ICD-10-CM | POA: Diagnosis not present

## 2018-12-17 DIAGNOSIS — I472 Ventricular tachycardia: Secondary | ICD-10-CM | POA: Diagnosis not present

## 2018-12-17 DIAGNOSIS — F1010 Alcohol abuse, uncomplicated: Secondary | ICD-10-CM

## 2018-12-17 DIAGNOSIS — I10 Essential (primary) hypertension: Secondary | ICD-10-CM | POA: Diagnosis not present

## 2018-12-17 DIAGNOSIS — F10231 Alcohol dependence with withdrawal delirium: Secondary | ICD-10-CM

## 2018-12-17 DIAGNOSIS — S299XXA Unspecified injury of thorax, initial encounter: Secondary | ICD-10-CM | POA: Diagnosis not present

## 2018-12-18 DIAGNOSIS — I214 Non-ST elevation (NSTEMI) myocardial infarction: Secondary | ICD-10-CM | POA: Diagnosis not present

## 2018-12-18 DIAGNOSIS — F10231 Alcohol dependence with withdrawal delirium: Secondary | ICD-10-CM | POA: Diagnosis not present

## 2018-12-18 DIAGNOSIS — K219 Gastro-esophageal reflux disease without esophagitis: Secondary | ICD-10-CM | POA: Diagnosis not present

## 2018-12-18 DIAGNOSIS — F1010 Alcohol abuse, uncomplicated: Secondary | ICD-10-CM | POA: Diagnosis not present

## 2018-12-19 DIAGNOSIS — F1010 Alcohol abuse, uncomplicated: Secondary | ICD-10-CM | POA: Diagnosis not present

## 2018-12-19 DIAGNOSIS — F10231 Alcohol dependence with withdrawal delirium: Secondary | ICD-10-CM | POA: Diagnosis not present

## 2018-12-19 DIAGNOSIS — I214 Non-ST elevation (NSTEMI) myocardial infarction: Secondary | ICD-10-CM | POA: Diagnosis not present

## 2018-12-19 DIAGNOSIS — K219 Gastro-esophageal reflux disease without esophagitis: Secondary | ICD-10-CM | POA: Diagnosis not present

## 2018-12-20 DIAGNOSIS — K219 Gastro-esophageal reflux disease without esophagitis: Secondary | ICD-10-CM | POA: Diagnosis not present

## 2018-12-20 DIAGNOSIS — I214 Non-ST elevation (NSTEMI) myocardial infarction: Secondary | ICD-10-CM | POA: Diagnosis not present

## 2018-12-20 DIAGNOSIS — F1010 Alcohol abuse, uncomplicated: Secondary | ICD-10-CM | POA: Diagnosis not present

## 2018-12-20 DIAGNOSIS — F10231 Alcohol dependence with withdrawal delirium: Secondary | ICD-10-CM | POA: Diagnosis not present

## 2018-12-21 DIAGNOSIS — I214 Non-ST elevation (NSTEMI) myocardial infarction: Secondary | ICD-10-CM | POA: Diagnosis not present

## 2018-12-21 DIAGNOSIS — K219 Gastro-esophageal reflux disease without esophagitis: Secondary | ICD-10-CM | POA: Diagnosis not present

## 2018-12-21 DIAGNOSIS — F10231 Alcohol dependence with withdrawal delirium: Secondary | ICD-10-CM | POA: Diagnosis not present

## 2018-12-21 DIAGNOSIS — F1010 Alcohol abuse, uncomplicated: Secondary | ICD-10-CM | POA: Diagnosis not present

## 2018-12-22 DIAGNOSIS — I214 Non-ST elevation (NSTEMI) myocardial infarction: Secondary | ICD-10-CM | POA: Diagnosis not present

## 2018-12-22 DIAGNOSIS — F1010 Alcohol abuse, uncomplicated: Secondary | ICD-10-CM | POA: Diagnosis not present

## 2018-12-22 DIAGNOSIS — F10231 Alcohol dependence with withdrawal delirium: Secondary | ICD-10-CM | POA: Diagnosis not present

## 2018-12-22 DIAGNOSIS — K219 Gastro-esophageal reflux disease without esophagitis: Secondary | ICD-10-CM | POA: Diagnosis not present

## 2018-12-22 DIAGNOSIS — R7989 Other specified abnormal findings of blood chemistry: Secondary | ICD-10-CM | POA: Diagnosis not present

## 2018-12-23 DIAGNOSIS — K219 Gastro-esophageal reflux disease without esophagitis: Secondary | ICD-10-CM | POA: Diagnosis not present

## 2018-12-23 DIAGNOSIS — F10231 Alcohol dependence with withdrawal delirium: Secondary | ICD-10-CM | POA: Diagnosis not present

## 2018-12-23 DIAGNOSIS — F1010 Alcohol abuse, uncomplicated: Secondary | ICD-10-CM | POA: Diagnosis not present

## 2018-12-23 DIAGNOSIS — I214 Non-ST elevation (NSTEMI) myocardial infarction: Secondary | ICD-10-CM | POA: Diagnosis not present

## 2018-12-24 DIAGNOSIS — I214 Non-ST elevation (NSTEMI) myocardial infarction: Secondary | ICD-10-CM | POA: Diagnosis not present

## 2018-12-24 DIAGNOSIS — F10231 Alcohol dependence with withdrawal delirium: Secondary | ICD-10-CM | POA: Diagnosis not present

## 2018-12-24 DIAGNOSIS — K219 Gastro-esophageal reflux disease without esophagitis: Secondary | ICD-10-CM | POA: Diagnosis not present

## 2018-12-24 DIAGNOSIS — F1010 Alcohol abuse, uncomplicated: Secondary | ICD-10-CM | POA: Diagnosis not present

## 2018-12-25 DIAGNOSIS — F1010 Alcohol abuse, uncomplicated: Secondary | ICD-10-CM | POA: Diagnosis not present

## 2018-12-25 DIAGNOSIS — F10231 Alcohol dependence with withdrawal delirium: Secondary | ICD-10-CM | POA: Diagnosis not present

## 2018-12-25 DIAGNOSIS — I214 Non-ST elevation (NSTEMI) myocardial infarction: Secondary | ICD-10-CM | POA: Diagnosis not present

## 2019-01-29 DIAGNOSIS — N183 Chronic kidney disease, stage 3 (moderate): Secondary | ICD-10-CM | POA: Diagnosis not present

## 2019-01-29 DIAGNOSIS — I1310 Hypertensive heart and chronic kidney disease without heart failure, with stage 1 through stage 4 chronic kidney disease, or unspecified chronic kidney disease: Secondary | ICD-10-CM | POA: Diagnosis not present

## 2019-01-29 DIAGNOSIS — M109 Gout, unspecified: Secondary | ICD-10-CM | POA: Diagnosis not present

## 2019-01-29 DIAGNOSIS — R7303 Prediabetes: Secondary | ICD-10-CM | POA: Diagnosis not present

## 2019-03-02 DIAGNOSIS — N183 Chronic kidney disease, stage 3 (moderate): Secondary | ICD-10-CM | POA: Diagnosis not present

## 2019-03-02 DIAGNOSIS — I1310 Hypertensive heart and chronic kidney disease without heart failure, with stage 1 through stage 4 chronic kidney disease, or unspecified chronic kidney disease: Secondary | ICD-10-CM | POA: Diagnosis not present

## 2019-03-02 DIAGNOSIS — R7303 Prediabetes: Secondary | ICD-10-CM | POA: Diagnosis not present

## 2019-03-20 ENCOUNTER — Inpatient Hospital Stay (HOSPITAL_COMMUNITY)
Admission: EM | Admit: 2019-03-20 | Discharge: 2019-03-24 | DRG: 378 | Disposition: A | Discharge status: Home / Self Care | Payer: Medicare Other | Attending: Internal Medicine | Admitting: Internal Medicine

## 2019-03-20 ENCOUNTER — Encounter (HOSPITAL_COMMUNITY): Payer: Self-pay | Admitting: Emergency Medicine

## 2019-03-20 ENCOUNTER — Other Ambulatory Visit: Payer: Self-pay

## 2019-03-20 DIAGNOSIS — I491 Atrial premature depolarization: Secondary | ICD-10-CM | POA: Diagnosis not present

## 2019-03-20 DIAGNOSIS — I472 Ventricular tachycardia, unspecified: Secondary | ICD-10-CM

## 2019-03-20 DIAGNOSIS — K219 Gastro-esophageal reflux disease without esophagitis: Secondary | ICD-10-CM | POA: Diagnosis present

## 2019-03-20 DIAGNOSIS — K5731 Diverticulosis of large intestine without perforation or abscess with bleeding: Secondary | ICD-10-CM | POA: Diagnosis present

## 2019-03-20 DIAGNOSIS — D696 Thrombocytopenia, unspecified: Secondary | ICD-10-CM | POA: Diagnosis present

## 2019-03-20 DIAGNOSIS — M109 Gout, unspecified: Secondary | ICD-10-CM | POA: Diagnosis present

## 2019-03-20 DIAGNOSIS — R58 Hemorrhage, not elsewhere classified: Secondary | ICD-10-CM | POA: Diagnosis not present

## 2019-03-20 DIAGNOSIS — I440 Atrioventricular block, first degree: Secondary | ICD-10-CM | POA: Diagnosis present

## 2019-03-20 DIAGNOSIS — F17210 Nicotine dependence, cigarettes, uncomplicated: Secondary | ICD-10-CM | POA: Diagnosis present

## 2019-03-20 DIAGNOSIS — I10 Essential (primary) hypertension: Secondary | ICD-10-CM | POA: Diagnosis not present

## 2019-03-20 DIAGNOSIS — Q248 Other specified congenital malformations of heart: Secondary | ICD-10-CM

## 2019-03-20 DIAGNOSIS — D62 Acute posthemorrhagic anemia: Secondary | ICD-10-CM | POA: Diagnosis not present

## 2019-03-20 DIAGNOSIS — R000 Tachycardia, unspecified: Secondary | ICD-10-CM | POA: Diagnosis not present

## 2019-03-20 DIAGNOSIS — N179 Acute kidney failure, unspecified: Secondary | ICD-10-CM | POA: Diagnosis not present

## 2019-03-20 DIAGNOSIS — N182 Chronic kidney disease, stage 2 (mild): Secondary | ICD-10-CM | POA: Diagnosis present

## 2019-03-20 DIAGNOSIS — M25512 Pain in left shoulder: Secondary | ICD-10-CM | POA: Diagnosis present

## 2019-03-20 DIAGNOSIS — K625 Hemorrhage of anus and rectum: Secondary | ICD-10-CM | POA: Diagnosis not present

## 2019-03-20 DIAGNOSIS — N189 Chronic kidney disease, unspecified: Secondary | ICD-10-CM

## 2019-03-20 DIAGNOSIS — Z79899 Other long term (current) drug therapy: Secondary | ICD-10-CM

## 2019-03-20 DIAGNOSIS — K922 Gastrointestinal hemorrhage, unspecified: Secondary | ICD-10-CM | POA: Diagnosis not present

## 2019-03-20 DIAGNOSIS — I129 Hypertensive chronic kidney disease with stage 1 through stage 4 chronic kidney disease, or unspecified chronic kidney disease: Secondary | ICD-10-CM | POA: Diagnosis present

## 2019-03-20 DIAGNOSIS — Z8546 Personal history of malignant neoplasm of prostate: Secondary | ICD-10-CM

## 2019-03-20 DIAGNOSIS — I4729 Other ventricular tachycardia: Secondary | ICD-10-CM

## 2019-03-20 DIAGNOSIS — Z9079 Acquired absence of other genital organ(s): Secondary | ICD-10-CM

## 2019-03-20 DIAGNOSIS — G8929 Other chronic pain: Secondary | ICD-10-CM | POA: Diagnosis present

## 2019-03-20 DIAGNOSIS — Z8719 Personal history of other diseases of the digestive system: Secondary | ICD-10-CM

## 2019-03-20 DIAGNOSIS — Z8249 Family history of ischemic heart disease and other diseases of the circulatory system: Secondary | ICD-10-CM

## 2019-03-20 DIAGNOSIS — I499 Cardiac arrhythmia, unspecified: Secondary | ICD-10-CM | POA: Diagnosis not present

## 2019-03-20 HISTORY — DX: Diverticulosis of intestine, part unspecified, without perforation or abscess without bleeding: K57.90

## 2019-03-20 LAB — CBC
HCT: 37.9 % — ABNORMAL LOW (ref 39.0–52.0)
Hemoglobin: 11.9 g/dL — ABNORMAL LOW (ref 13.0–17.0)
MCH: 27.4 pg (ref 26.0–34.0)
MCHC: 31.4 g/dL (ref 30.0–36.0)
MCV: 87.3 fL (ref 80.0–100.0)
Platelets: 163 K/uL (ref 150–400)
RBC: 4.34 MIL/uL (ref 4.22–5.81)
RDW: 13.8 % (ref 11.5–15.5)
WBC: 8.4 K/uL (ref 4.0–10.5)
nRBC: 0 % (ref 0.0–0.2)

## 2019-03-20 LAB — COMPREHENSIVE METABOLIC PANEL
ALT: 14 U/L (ref 0–44)
AST: 18 U/L (ref 15–41)
Albumin: 3.6 g/dL (ref 3.5–5.0)
Alkaline Phosphatase: 89 U/L (ref 38–126)
Anion gap: 11 (ref 5–15)
BUN: 15 mg/dL (ref 8–23)
CO2: 20 mmol/L — ABNORMAL LOW (ref 22–32)
Calcium: 9.1 mg/dL (ref 8.9–10.3)
Chloride: 110 mmol/L (ref 98–111)
Creatinine, Ser: 1.68 mg/dL — ABNORMAL HIGH (ref 0.61–1.24)
GFR calc Af Amer: 43 mL/min — ABNORMAL LOW (ref >60)
GFR calc non Af Amer: 37 mL/min — ABNORMAL LOW (ref >60)
Glucose, Bld: 132 mg/dL — ABNORMAL HIGH (ref 70–99)
Potassium: 3.8 mmol/L (ref 3.5–5.1)
Sodium: 141 mmol/L (ref 135–145)
Total Bilirubin: 0.7 mg/dL (ref 0.3–1.2)
Total Protein: 6.8 g/dL (ref 6.5–8.1)

## 2019-03-20 LAB — MAGNESIUM: Magnesium: 1.8 mg/dL (ref 1.7–2.4)

## 2019-03-20 LAB — TROPONIN I: Troponin I: <0.03 ng/mL

## 2019-03-20 MED ORDER — LABETALOL HCL 5 MG/ML IV SOLN
20.0000 mg | Freq: Once | INTRAVENOUS | Status: AC
  Administered 2019-03-20: 20 mg via INTRAVENOUS
  Filled 2019-03-20: qty 4

## 2019-03-20 NOTE — ED Triage Notes (Signed)
BIB [facility] from home with c/o of GI Bleeding starting this evening. Per EMS pt having runs of Vtach. Started on Lidocaine, given 70mg  bolus. Hx of diverticulosis. EDP at bedside on arrival.

## 2019-03-20 NOTE — ED Provider Notes (Signed)
[facility] EMERGENCY DEPARTMENT Provider Note   CSN: [id number] Arrival date & time: 03/20/19  2150    History   Chief Complaint Chief Complaint  Patient presents with  . Rectal Bleeding    HPI Robert Hamilton is a 85 y.o. male.     HPI  Patient is an 85 year old male, he has a known history of diverticulosis, history of prostate cancer status post surgery, history of chronic kidney disease and lower GI bleed.  The last time he was in the hospital was December 21 of 2019 when his hemoglobin level was 11.4.  It was noted on prior records that were obtained during his visit in December 2019 that he had had multiple colonoscopy showing diverticulosis in the past.  He did very well with his admission to the hospital, improved and was ultimately discharged.  He has done well since that time until today when he noticed large amounts of bright red blood per rectum.  Denies any abdominal cramping, he has had some intermittent lightheadedness and dizziness but no shortness of breath.  Paramedics noted that the patient had multiple runs of ventricular tachycardia some lasting as long as 20-30 beats.  The patient denies any significant cardiac history and is never had arrhythmia or coronary disease that he knows of.  The paramedics placed the patient on a lidocaine drip prehospital because of the ventricular tachycardia.   Past Medical History:  Diagnosis Date  . Anemia   . Cancer ([place])   . Chronic kidney disease   . Diverticulosis   . GERD (gastroesophageal reflux disease)   . Hypertension     Patient Active Problem List   Diagnosis Date Noted  . Lower GI bleeding 11/19/2018  . Hepatic lesion 05/22/2017  . History of diverticulitis 05/22/2017  . History of GI bleed 05/22/2017  . Prostate cancer ([place]) 05/22/2017  . GERD (gastroesophageal reflux disease) 05/22/2017  . Normocytic anemia 05/22/2017  . Hypertension 05/22/2017  . Chronic kidney disease 05/22/2017   . Cigarette smoker 05/22/2017    Past Surgical History:  Procedure Laterality Date  . HERNIA REPAIR    . PROSTATE SURGERY    . PROSTATECTOMY          Home Medications    Prior to Admission medications   Medication Sig Start Date End Date Taking? Authorizing Provider  allopurinol (ZYLOPRIM) 300 MG tablet Take 150 mg by mouth daily.    [provider]  ferrous sulfate 325 (65 FE) MG tablet Take 325 mg by mouth 2 (two) times daily with a meal.    [provider]  pantoprazole (PROTONIX) 40 MG tablet Take 1 tablet (40 mg total) by mouth daily. 11/21/18   Regalado, Cassie Freer, MD    Family History History reviewed. No pertinent family history.  Social History Social History   Tobacco Use  . Smoking status: Current Every Day Smoker    Types: Cigarettes  . Smokeless tobacco: Never Used  Substance Use Topics  . Alcohol use: Yes  . Drug use: No     Allergies   Patient has no known allergies.   Review of Systems Review of Systems  All other systems reviewed and are negative.    Physical Exam Updated Vital Signs BP (!) 123/105   Pulse 83   Resp 16   Ht 1.753 m (5\' 9" )   Wt 73.5 kg   SpO2 100%   BMI 23.92 kg/m   Physical Exam Vitals signs and nursing note reviewed.  Constitutional:      General: He is not in acute distress.    Appearance: He is well-developed.  HENT:     Head: Normocephalic and atraumatic.     Mouth/Throat:     Pharynx: No oropharyngeal exudate.  Eyes:     General: No scleral icterus.       Right eye: No discharge.        Left eye: No discharge.     Conjunctiva/sclera: Conjunctivae normal.     Pupils: Pupils are equal, round, and reactive to light.  Neck:     Musculoskeletal: Normal range of motion and neck supple.     Thyroid: No thyromegaly.     Vascular: No JVD.  Cardiovascular:     Rate and Rhythm: Normal rate and regular rhythm.     Heart sounds: Normal heart sounds. No murmur. No friction rub. No gallop.    Pulmonary:     Effort: Pulmonary effort is normal. No respiratory distress.     Breath sounds: Normal breath sounds. No wheezing or rales.  Abdominal:     General: Bowel sounds are normal. There is no distension.     Palpations: Abdomen is soft. There is no mass.     Tenderness: There is no abdominal tenderness.  Genitourinary:    Comments: Bright red blood per rectum present, no masses Musculoskeletal: Normal range of motion.        General: No tenderness.  Lymphadenopathy:     Cervical: No cervical adenopathy.  Skin:    General: Skin is warm and dry.     Findings: No erythema or rash.  Neurological:     Mental Status: He is alert.     Coordination: Coordination normal.  Psychiatric:        Behavior: Behavior normal.      ED Treatments / Results  Labs (all labs ordered are listed, but only abnormal results are displayed) Labs Reviewed  COMPREHENSIVE METABOLIC PANEL - Abnormal; Notable for the following components:      Result Value   CO2 20 (*)    Glucose, Bld 132 (*)    Creatinine, Ser 1.68 (*)    GFR calc non Af Amer 37 (*)    GFR calc Af Amer 43 (*)    All other components within normal limits  CBC - Abnormal; Notable for the following components:   Hemoglobin 11.9 (*)    HCT 37.9 (*)    All other components within normal limits  TROPONIN I  MAGNESIUM  BRAIN NATRIURETIC PEPTIDE  TYPE AND SCREEN    EKG EKG Interpretation  Date/Time:  Saturday March 20 2019 22:00:04 EDT Ventricular Rate:  101 PR Interval:    QRS Duration: 90 QT Interval:  349 QTC Calculation: 453 R Axis:   50 Text Interpretation:  Sinus tachycardia Multiform ventricular premature complexes Minimal ST depression, lateral leads No old tracing to compare Confirmed by Noemi Chapel [phone]) on 03/20/2019 10:05:34 PM   Radiology No results found.  Procedures Procedures (including critical care time)  Medications Ordered in ED Medications  labetalol (NORMODYNE) injection 20 mg (20 mg  Intravenous Given 03/20/19 2225)     Initial Impression / Assessment and Plan / ED Course  I have reviewed the triage vital signs and the nursing notes.  Pertinent labs & imaging results that were available during my care of the patient were reviewed by me and considered in my medical decision making (see chart for details).        The  patient has an EKG which shows normal sinus rhythm with a rate of 101, there is occasional PVCs however the cardiac monitoring is already revealed multiple runs of ventricular tachycardia.  I will discuss with cardiology, labs are ordered, check hemoglobin, will need to be admitted because of his history of GI bleeding but at this time does not appear hemodynamically unstable regarding his bleeding.  His arrhythmia is another thing altogether and will need to be monitored and evaluated as well.  The patient's blood pressure is 202/110, he does not have any shortness of breath, I suspect that his hypertension is in part a cause of his ventricular tachycardia and thus this will be treated.   The patient has no significant further episodes of prolonged ventricular tachycardia after initial arrival, I discussed the patient's care with the hospitalist who will admit for gastrointestinal bleeding and cardiac monitoring for possible recurrent ventricular tachycardia.  Troponin was measured at less than 0.03, EKG without acute ischemia or significant arrhythmia other than occasional PVCs.     Final Clinical Impressions(s) / ED Diagnoses   Final diagnoses:  Rectal bleeding  Ventricular tachycardia ([place])      Noemi Chapel, MD 03/21/19 0004

## 2019-03-21 ENCOUNTER — Inpatient Hospital Stay (HOSPITAL_COMMUNITY): Payer: Medicare Other

## 2019-03-21 DIAGNOSIS — N179 Acute kidney failure, unspecified: Secondary | ICD-10-CM | POA: Diagnosis not present

## 2019-03-21 DIAGNOSIS — Z9079 Acquired absence of other genital organ(s): Secondary | ICD-10-CM | POA: Diagnosis not present

## 2019-03-21 DIAGNOSIS — I129 Hypertensive chronic kidney disease with stage 1 through stage 4 chronic kidney disease, or unspecified chronic kidney disease: Secondary | ICD-10-CM | POA: Diagnosis not present

## 2019-03-21 DIAGNOSIS — D696 Thrombocytopenia, unspecified: Secondary | ICD-10-CM | POA: Diagnosis not present

## 2019-03-21 DIAGNOSIS — I472 Ventricular tachycardia: Secondary | ICD-10-CM | POA: Diagnosis not present

## 2019-03-21 DIAGNOSIS — K5731 Diverticulosis of large intestine without perforation or abscess with bleeding: Secondary | ICD-10-CM | POA: Diagnosis not present

## 2019-03-21 DIAGNOSIS — Z8249 Family history of ischemic heart disease and other diseases of the circulatory system: Secondary | ICD-10-CM | POA: Diagnosis not present

## 2019-03-21 DIAGNOSIS — Z8719 Personal history of other diseases of the digestive system: Secondary | ICD-10-CM | POA: Diagnosis not present

## 2019-03-21 DIAGNOSIS — M109 Gout, unspecified: Secondary | ICD-10-CM | POA: Diagnosis not present

## 2019-03-21 DIAGNOSIS — I440 Atrioventricular block, first degree: Secondary | ICD-10-CM | POA: Diagnosis not present

## 2019-03-21 DIAGNOSIS — M25512 Pain in left shoulder: Secondary | ICD-10-CM | POA: Diagnosis not present

## 2019-03-21 DIAGNOSIS — D500 Iron deficiency anemia secondary to blood loss (chronic): Secondary | ICD-10-CM | POA: Diagnosis not present

## 2019-03-21 DIAGNOSIS — I340 Nonrheumatic mitral (valve) insufficiency: Secondary | ICD-10-CM | POA: Diagnosis not present

## 2019-03-21 DIAGNOSIS — R55 Syncope and collapse: Secondary | ICD-10-CM | POA: Diagnosis not present

## 2019-03-21 DIAGNOSIS — K625 Hemorrhage of anus and rectum: Secondary | ICD-10-CM | POA: Diagnosis not present

## 2019-03-21 DIAGNOSIS — K922 Gastrointestinal hemorrhage, unspecified: Secondary | ICD-10-CM

## 2019-03-21 DIAGNOSIS — I5189 Other ill-defined heart diseases: Secondary | ICD-10-CM | POA: Diagnosis not present

## 2019-03-21 DIAGNOSIS — D62 Acute posthemorrhagic anemia: Secondary | ICD-10-CM | POA: Diagnosis not present

## 2019-03-21 DIAGNOSIS — I361 Nonrheumatic tricuspid (valve) insufficiency: Secondary | ICD-10-CM | POA: Diagnosis not present

## 2019-03-21 DIAGNOSIS — F17210 Nicotine dependence, cigarettes, uncomplicated: Secondary | ICD-10-CM | POA: Diagnosis present

## 2019-03-21 DIAGNOSIS — Z8546 Personal history of malignant neoplasm of prostate: Secondary | ICD-10-CM | POA: Diagnosis not present

## 2019-03-21 DIAGNOSIS — K5791 Diverticulosis of intestine, part unspecified, without perforation or abscess with bleeding: Secondary | ICD-10-CM

## 2019-03-21 DIAGNOSIS — K219 Gastro-esophageal reflux disease without esophagitis: Secondary | ICD-10-CM | POA: Diagnosis not present

## 2019-03-21 DIAGNOSIS — G8929 Other chronic pain: Secondary | ICD-10-CM | POA: Diagnosis present

## 2019-03-21 DIAGNOSIS — Z79899 Other long term (current) drug therapy: Secondary | ICD-10-CM | POA: Diagnosis not present

## 2019-03-21 DIAGNOSIS — N182 Chronic kidney disease, stage 2 (mild): Secondary | ICD-10-CM | POA: Diagnosis not present

## 2019-03-21 DIAGNOSIS — K921 Melena: Secondary | ICD-10-CM | POA: Diagnosis not present

## 2019-03-21 DIAGNOSIS — Q248 Other specified congenital malformations of heart: Secondary | ICD-10-CM | POA: Diagnosis not present

## 2019-03-21 LAB — CBC
HCT: 30.7 % — ABNORMAL LOW (ref 39.0–52.0)
Hemoglobin: 9.8 g/dL — ABNORMAL LOW (ref 13.0–17.0)
MCH: 27.5 pg (ref 26.0–34.0)
MCHC: 31.9 g/dL (ref 30.0–36.0)
MCV: 86.2 fL (ref 80.0–100.0)
Platelets: 135 10*3/uL — ABNORMAL LOW (ref 150–400)
RBC: 3.56 MIL/uL — ABNORMAL LOW (ref 4.22–5.81)
RDW: 14 % (ref 11.5–15.5)
WBC: 6.8 10*3/uL (ref 4.0–10.5)
nRBC: 0 % (ref 0.0–0.2)

## 2019-03-21 LAB — BASIC METABOLIC PANEL
Anion gap: 9 (ref 5–15)
BUN: 15 mg/dL (ref 8–23)
CO2: 20 mmol/L — ABNORMAL LOW (ref 22–32)
Calcium: 8.3 mg/dL — ABNORMAL LOW (ref 8.9–10.3)
Chloride: 111 mmol/L (ref 98–111)
Creatinine, Ser: 1.23 mg/dL (ref 0.61–1.24)
GFR calc Af Amer: >60 mL/min (ref >60)
GFR calc non Af Amer: 54 mL/min — ABNORMAL LOW (ref >60)
Glucose, Bld: 98 mg/dL (ref 70–99)
Potassium: 4 mmol/L (ref 3.5–5.1)
Sodium: 140 mmol/L (ref 135–145)

## 2019-03-21 LAB — TYPE AND SCREEN
ABO/RH(D): B POS
Antibody Screen: NEGATIVE

## 2019-03-21 LAB — HEMOGLOBIN AND HEMATOCRIT, BLOOD
HCT: 27.7 % — ABNORMAL LOW (ref 39.0–52.0)
HCT: 28.2 % — ABNORMAL LOW (ref 39.0–52.0)
Hemoglobin: 8.8 g/dL — ABNORMAL LOW (ref 13.0–17.0)
Hemoglobin: 8.8 g/dL — ABNORMAL LOW (ref 13.0–17.0)

## 2019-03-21 LAB — APTT: aPTT: 36 seconds (ref 24–36)

## 2019-03-21 LAB — ECHOCARDIOGRAM COMPLETE
Height: 69 in
Weight: 2390.4 oz

## 2019-03-21 LAB — TROPONIN I
Troponin I: <0.03 ng/mL (ref <0.03)
Troponin I: <0.03 ng/mL (ref <0.03)

## 2019-03-21 LAB — PROTIME-INR
INR: 1.1 (ref 0.8–1.2)
Prothrombin Time: 13.9 seconds (ref 11.4–15.2)

## 2019-03-21 LAB — ABO/RH: ABO/RH(D): B POS

## 2019-03-21 LAB — BRAIN NATRIURETIC PEPTIDE: B Natriuretic Peptide: 281.5 pg/mL — ABNORMAL HIGH (ref 0.0–100.0)

## 2019-03-21 LAB — MAGNESIUM: Magnesium: 1.7 mg/dL (ref 1.7–2.4)

## 2019-03-21 IMAGING — CT CT ANGIOGRAPHY ABDOMEN AND PELVIS WITH CONTRAST AND WITHOUT CONT
2 of 9 series · 10 of 46 positions shown, 16 images · IV contrast (Omni 300)
Comparison: None.

CLINICAL DATA: Hematochezia and history of lower GI bleed secondary
to diverticulosis.

EXAM:
CT ANGIOGRAPHY ABDOMEN AND PELVIS WITH CONTRAST
TECHNIQUE: Multidetector CT imaging of the abdomen and pelvis was performed
using the standard protocol during bolus administration of
intravenous contrast. Multiplanar reconstructed images and MIPs were
obtained and reviewed to evaluate the vascular anatomy.
CONTRAST:  100mL OMNIPAQUE IOHEXOL 350 MG/ML SOLN

[Series 7: renal cta cor · coronal · 0.67mm/px · 2 of 145 slices shown, 3 images]
[im 49/145  soft-tissue]
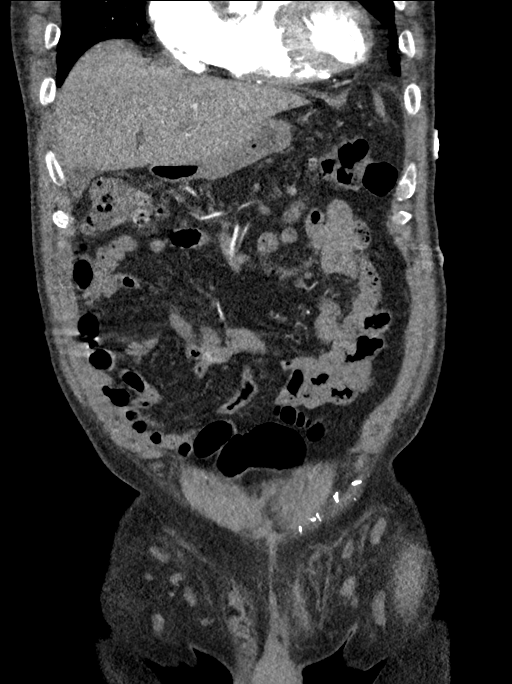
[im 49/145  bone]
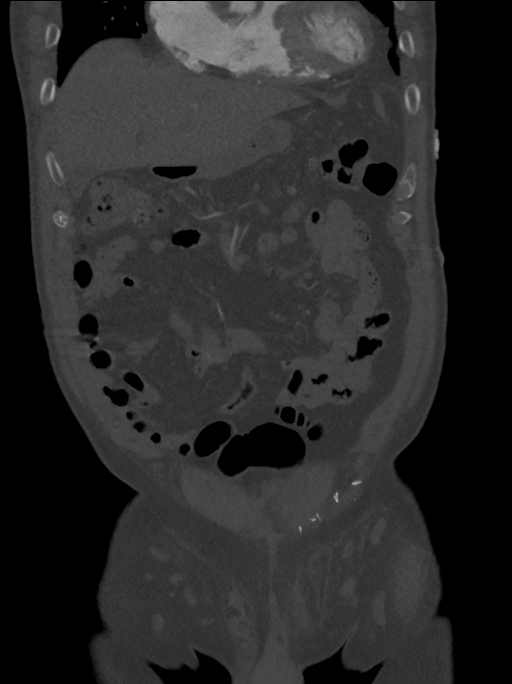
[im 97/145  soft-tissue]
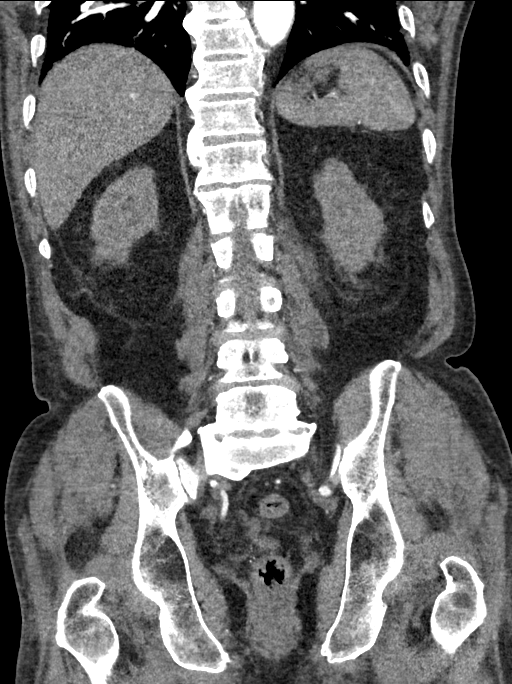

[Series 11: venous 5.0 · axial · portal-venous · 0.72mm/px · z∈[+860,+1210]mm · 8 of 92 slices shown, 13 images]
[im 11/92  soft-tissue]
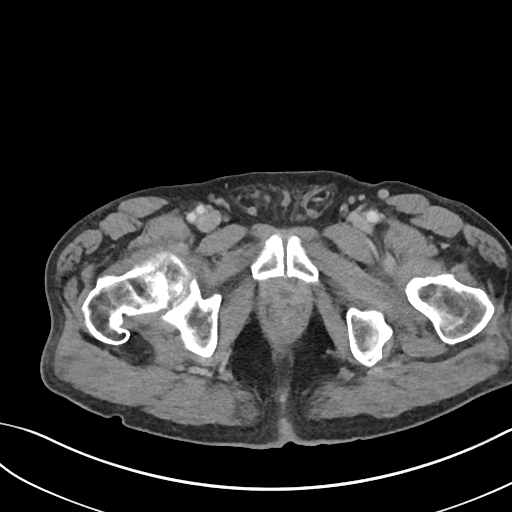
[im 11/92  bone]
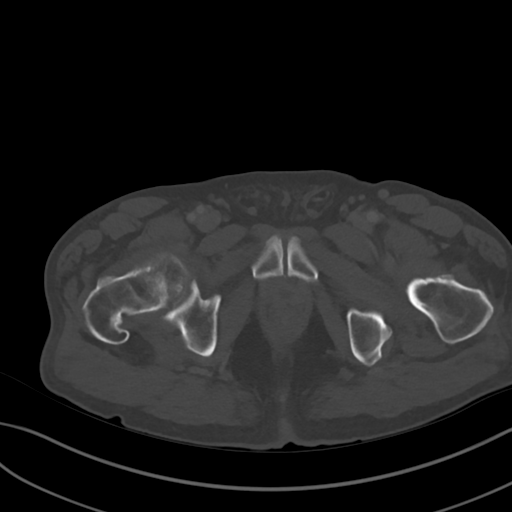
[im 21/92  soft-tissue]
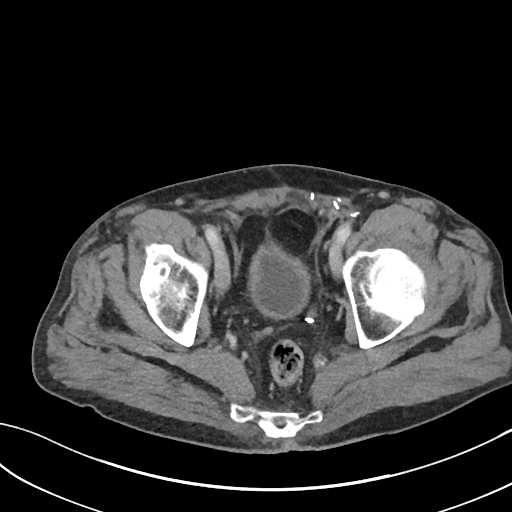
[im 31/92  soft-tissue]
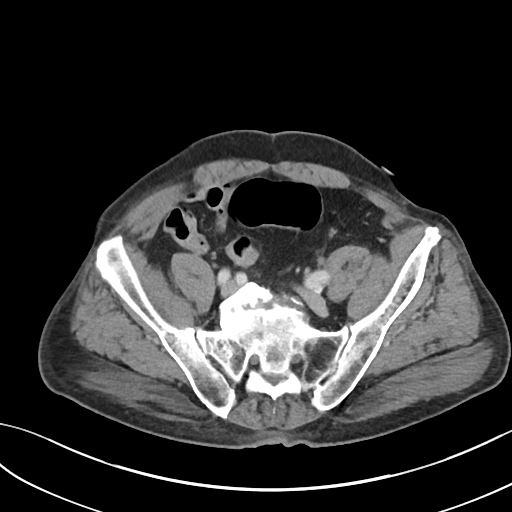
[im 41/92  soft-tissue]
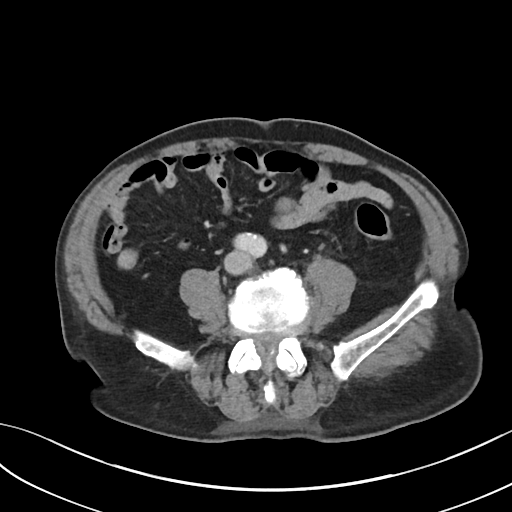
[im 51/92  soft-tissue]
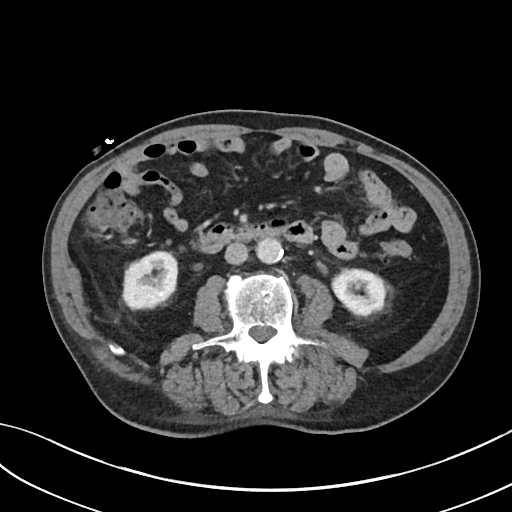
[im 51/92  lung]
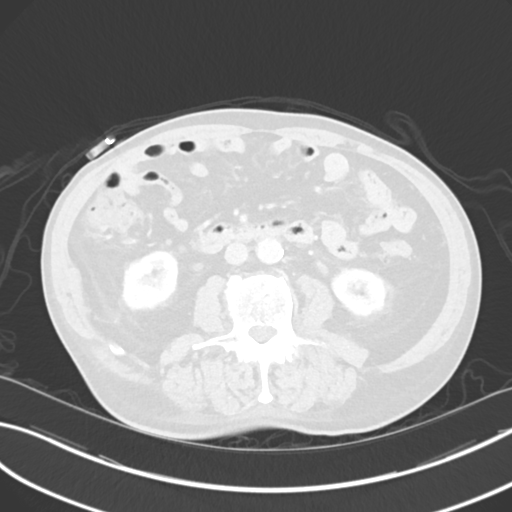
[im 61/92  soft-tissue]
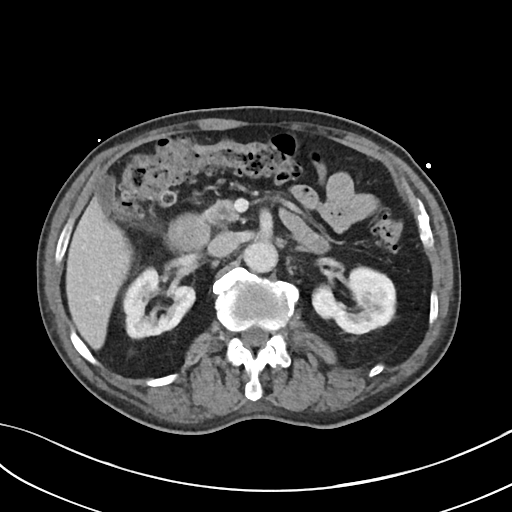
[im 61/92  lung]
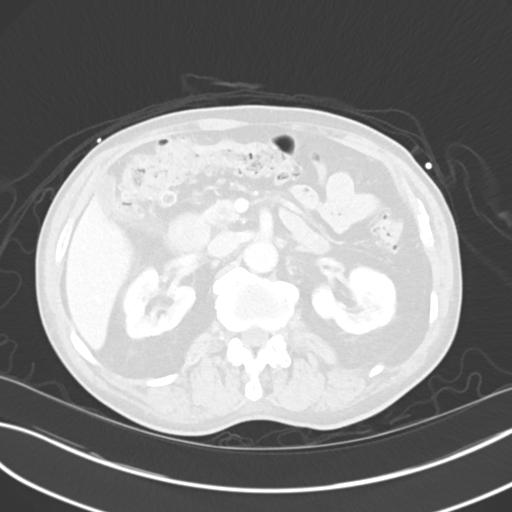
[im 71/92  soft-tissue]
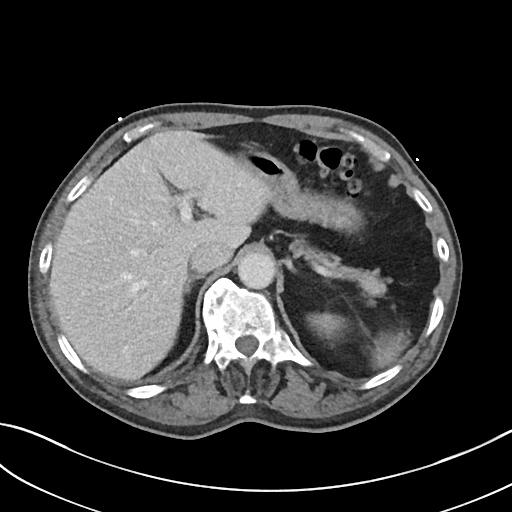
[im 71/92  lung]
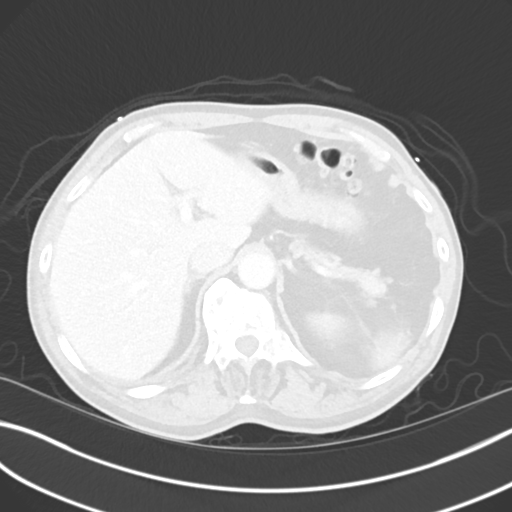
[im 81/92  soft-tissue]
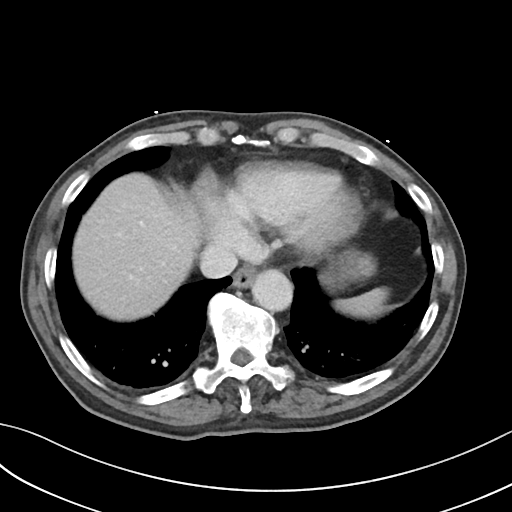
[im 81/92  lung]
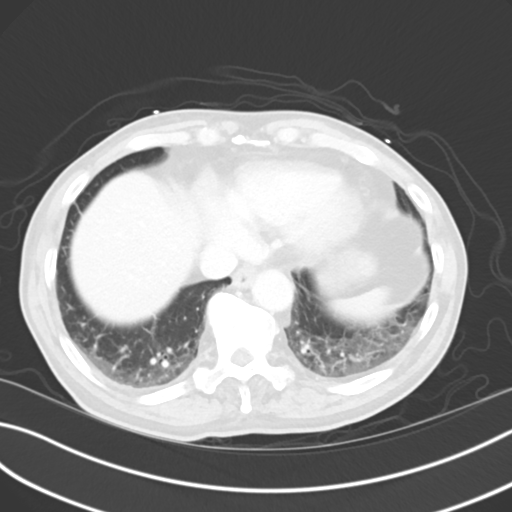

[10 of 46 positions shown; findings below may reference images not displayed]

FINDINGS: VASCULAR

Aorta: The aorta shows mild atherosclerosis without evidence of
aneurysm, stenosis or dissection.

Celiac: Normally patent. Normal distal branch vessel anatomy and
patency. No gastric or duodenal bleeding source identified.

SMA: Normally patent. Distal branches demonstrate normal patency. On
arterial and venous phases of imaging, no active bleeding source is
identified from the small bowel or colon.

Renals: Both kidneys are supplied by dominant renal arteries and
small accessory lower pole arteries. No evidence of significant
renal artery stenosis.

IMA: Normally patent.

Inflow: Bilateral iliac arteries demonstrate normal patency. No
stenosis, aneurysmal disease or significant atherosclerosis.

Proximal Outflow: Bilateral common femoral arteries and femoral
bifurcations are widely patent.

Veins: Venous phase imaging demonstrates normal patency of
visualized mesenteric veins, splenic vein and portal vein. The IVC,
bilateral renal veins, iliac veins and common femoral veins
demonstrate normal patency. During the venous phase, no identifiable
contrast extravasation is identified into the gastrointestinal
tract.

Review of the MIP images confirms the above findings.

NON-VASCULAR

Lower chest: No acute abnormality.

Hepatobiliary: No focal liver abnormality is seen. No gallstones,
gallbladder wall thickening, or biliary dilatation.

Pancreas: Unremarkable. No pancreatic ductal dilatation or
surrounding inflammatory changes.

Spleen: Normal in size without focal abnormality.

Adrenals/Urinary Tract: Adrenal glands are unremarkable. Kidneys are
normal, without renal calculi, focal lesion, or hydronephrosis.
Bladder is unremarkable.

Stomach/Bowel: No bleeding source is identified within the
gastrointestinal tract by CTA. The bowel shows no evidence of
obstruction, ileus or inflammation. There are mild scattered
diverticula throughout much of the colon without evidence
diverticulitis. The appendix is unremarkable. No free
intraperitoneal air is identified.

Lymphatic: No enlarged lymph nodes identified.

Reproductive: Prostate is unremarkable.

Other: Evidence of prior left inguinal hernia repair. No significant
hernias are identified. No ascites or focal abscess.

Musculoskeletal: Degenerative disc disease at L5-S1. No focal bone
lesion or fracture identified.
IMPRESSION: 1. No bleeding source identified in the gastrointestinal tract by
CTA.
2. Mild diverticulosis of the colon without evidence of acute
diverticulitis.

## 2019-03-21 MED ORDER — LIDOCAINE IN D5W 4-5 MG/ML-% IV SOLN
2.0000 mg/min | INTRAVENOUS | Status: DC
  Filled 2019-03-21: qty 500

## 2019-03-21 MED ORDER — MAGNESIUM SULFATE 2 GM/50ML IV SOLN
2.0000 g | Freq: Once | INTRAVENOUS | Status: AC
  Administered 2019-03-21: 2 g via INTRAVENOUS
  Filled 2019-03-21: qty 50

## 2019-03-21 MED ORDER — METOPROLOL TARTRATE 25 MG PO TABS
25.0000 mg | ORAL_TABLET | Freq: Two times a day (BID) | ORAL | Status: DC
  Administered 2019-03-21 – 2019-03-23 (×4): 25 mg via ORAL
  Filled 2019-03-21 (×4): qty 1

## 2019-03-21 MED ORDER — SODIUM CHLORIDE 0.9 % IV BOLUS: 500.0000 mL | Freq: Once | INTRAVENOUS | Status: DC

## 2019-03-21 MED ORDER — IOHEXOL 350 MG/ML SOLN
100.0000 mL | Freq: Once | INTRAVENOUS | Status: AC | PRN
  Administered 2019-03-21: 100 mL via INTRAVENOUS

## 2019-03-21 MED ORDER — CLONIDINE HCL 0.1 MG PO TABS
0.1000 mg | ORAL_TABLET | Freq: Two times a day (BID) | ORAL | Status: DC
  Administered 2019-03-21 (×2): 0.1 mg via ORAL
  Filled 2019-03-21 (×2): qty 1

## 2019-03-21 MED ORDER — FOLIC ACID 1 MG PO TABS
1.0000 mg | ORAL_TABLET | Freq: Every day | ORAL | Status: DC
  Administered 2019-03-21 – 2019-03-24 (×4): 1 mg via ORAL
  Filled 2019-03-21 (×4): qty 1

## 2019-03-21 MED ORDER — SODIUM CHLORIDE 0.9 % IV SOLN
INTRAVENOUS | Status: DC
  Administered 2019-03-21 (×2): via INTRAVENOUS

## 2019-03-21 MED ORDER — SODIUM CHLORIDE 0.9 % IV BOLUS
1000.0000 mL | Freq: Once | INTRAVENOUS | Status: AC
  Administered 2019-03-21: 1000 mL via INTRAVENOUS

## 2019-03-21 MED ORDER — VITAMIN B-1 100 MG PO TABS
100.0000 mg | ORAL_TABLET | Freq: Every day | ORAL | Status: DC
  Administered 2019-03-21 – 2019-03-24 (×4): 100 mg via ORAL
  Filled 2019-03-21 (×4): qty 1

## 2019-03-21 MED ORDER — SODIUM CHLORIDE 0.9 % IV SOLN
INTRAVENOUS | Status: DC
  Administered 2019-03-21: 04:00:00 via INTRAVENOUS

## 2019-03-21 MED ORDER — ALLOPURINOL 300 MG PO TABS
150.0000 mg | ORAL_TABLET | Freq: Every day | ORAL | Status: DC
  Administered 2019-03-21 – 2019-03-24 (×4): 150 mg via ORAL
  Filled 2019-03-21 (×4): qty 1

## 2019-03-21 MED ORDER — PANTOPRAZOLE SODIUM 40 MG IV SOLR
40.0000 mg | Freq: Two times a day (BID) | INTRAVENOUS | Status: DC
  Administered 2019-03-21 (×2): 40 mg via INTRAVENOUS
  Filled 2019-03-21 (×2): qty 40

## 2019-03-21 MED ORDER — SODIUM CHLORIDE 0.9% FLUSH
3.0000 mL | Freq: Two times a day (BID) | INTRAVENOUS | Status: DC
  Administered 2019-03-21 – 2019-03-24 (×7): 3 mL via INTRAVENOUS

## 2019-03-21 MED ORDER — PANTOPRAZOLE SODIUM 40 MG PO TBEC
40.0000 mg | DELAYED_RELEASE_TABLET | Freq: Every day | ORAL | Status: DC
  Administered 2019-03-22 – 2019-03-24 (×3): 40 mg via ORAL
  Filled 2019-03-21 (×3): qty 1

## 2019-03-21 NOTE — Progress Notes (Signed)
  Echocardiogram 2D Echocardiogram has been performed.  Adamary Savary L Androw 03/21/2019, 4:19 PM

## 2019-03-21 NOTE — ED Notes (Signed)
ED TO INPATIENT HANDOFF REPORT  ED Nurse Name and Phone #: Suezanne Jacquet [phone]  S Name/Age/Gender Robert Hamilton 85 y.o. male Room/Bed: RESUSC/RESUSC  Code Status   Code Status: Full Code  Home/SNF/Other Home Patient oriented to: self, place, time and situation Is this baseline? Yes   Triage Complete: Triage complete  Chief Complaint GI BLEED  Triage Note BIB [facility] from home with c/o of GI Bleeding starting this evening. Per EMS pt having runs of Vtach. Started on Lidocaine, given 70mg  bolus. Hx of diverticulosis. EDP at bedside on arrival.    Allergies No Known Allergies  Level of Care/Admitting Diagnosis ED Disposition    ED Disposition Condition [facility] Area: [place] [[id number]]  Level of Care: Telemetry Medical [104]  I expect the patient will be discharged within 24 hours: Yes  LOW acuity---Tx typically complete <24 hrs---ACUTE conditions typically can be evaluated <24 hours---LABS likely to return to acceptable levels <24 hours---IS near functional baseline---EXPECTED to return to current living arrangement---NOT newly hypoxic: Does not meet criteria for 5C-Observation unit  Covid Evaluation: N/A  Diagnosis: GIB (gastrointestinal bleeding) [151761]  Admitting Physician: Vilma Prader [[id number]]  Attending Physician: Vilma Prader [[id number]]  PT Class (Do Not Modify): Observation [104]  PT Acc Code (Do Not Modify): Observation [10022]       B Medical/Surgery History Past Medical History:  Diagnosis Date  . Anemia   . Cancer ([place])   . Chronic kidney disease   . Diverticulosis   . GERD (gastroesophageal reflux disease)   . Hypertension    Past Surgical History:  Procedure Laterality Date  . HERNIA REPAIR    . PROSTATE SURGERY    . PROSTATECTOMY       A IV Location/Drains/Wounds Patient Lines/Drains/Airways Status   Active Line/Drains/Airways    Name:   Placement date:   Placement time:   Site:    Days:   Peripheral IV 03/20/19 Right Antecubital   03/20/19    2143    Antecubital   1   Peripheral IV 03/20/19 Left Antecubital   03/20/19    2204    Antecubital   1          Intake/Output Last 24 hours No intake or output data in the 24 hours ending 03/21/19 0050  Labs/Imaging Results for orders placed or performed during the hospital encounter of 03/20/19 (from the past 48 hour(s))  Comprehensive metabolic panel     Status: Abnormal   Collection Time: 03/20/19 10:06 PM  Result Value Ref Range   Sodium 141 135 - 145 mmol/L   Potassium 3.8 3.5 - 5.1 mmol/L   Chloride 110 98 - 111 mmol/L   CO2 20 (L) 22 - 32 mmol/L   Glucose, Bld 132 (H) 70 - 99 mg/dL   BUN 15 8 - 23 mg/dL   Creatinine, Ser 1.68 (H) 0.61 - 1.24 mg/dL   Calcium 9.1 8.9 - 10.3 mg/dL   Total Protein 6.8 6.5 - 8.1 g/dL   Albumin 3.6 3.5 - 5.0 g/dL   AST 18 15 - 41 U/L   ALT 14 0 - 44 U/L   Alkaline Phosphatase 89 38 - 126 U/L   Total Bilirubin 0.7 0.3 - 1.2 mg/dL   GFR calc non Af Amer 37 (L) >60 mL/min   GFR calc Af Amer 43 (L) >60 mL/min   Anion gap 11 5 - 15    Comment: Performed at [facility],  [address] [place], [place] [address]  CBC     Status: Abnormal   Collection Time: 03/20/19 10:06 PM  Result Value Ref Range   WBC 8.4 4.0 - 10.5 K/uL   RBC 4.34 4.22 - 5.81 MIL/uL   Hemoglobin 11.9 (L) 13.0 - 17.0 g/dL   HCT 37.9 (L) 39.0 - 52.0 %   MCV 87.3 80.0 - 100.0 fL   MCH 27.4 26.0 - 34.0 pg   MCHC 31.4 30.0 - 36.0 g/dL   RDW 13.8 11.5 - 15.5 %   Platelets 163 150 - 400 K/uL   nRBC 0.0 0.0 - 0.2 %    Comment: Performed at [facility], [place] [address], [place], [place] [address]  Type and screen [place]     Status: None (Preliminary result)   Collection Time: 03/20/19 10:06 PM  Result Value Ref Range   ABO/RH(D) B POS    Antibody Screen      NEG Performed at [facility] [address], [place], [place] [address]    Sample Expiration PENDING    Troponin I - ONCE - STAT     Status: None   Collection Time: 03/20/19 10:06 PM  Result Value Ref Range   Troponin I <0.03 <0.03 ng/mL    Comment: Performed at [facility], [place] [address], [place], [place] [address]  Brain natriuretic peptide     Status: Abnormal   Collection Time: 03/20/19 10:06 PM  Result Value Ref Range   B Natriuretic Peptide 281.5 (H) 0.0 - 100.0 pg/mL    Comment: Performed at [facility] [address], [place], [place] [address]  Magnesium     Status: None   Collection Time: 03/20/19 10:06 PM  Result Value Ref Range   Magnesium 1.8 1.7 - 2.4 mg/dL    Comment: Performed at [facility] [address] [place],  [address]   No results found.  Pending Labs Unresulted Labs (From admission, onward)    Start     Ordered   03/21/19 0500  CBC  Tomorrow morning,   R     03/21/19 0017   03/21/19 0109  Basic metabolic panel  Tomorrow morning,   R     03/21/19 0017   03/21/19 0013  Protime-INR  Tomorrow morning,   R     03/21/19 0017   03/21/19 0013  APTT  Tomorrow morning,   R     03/21/19 0017          Vitals/Pain Today's Vitals   03/20/19 2300 03/20/19 2315 03/20/19 2330 03/21/19 0045  BP: (!) 152/91 (!) 146/91 (!) 123/105 139/77  Pulse:   83   Resp: 19 13 16 16   SpO2:   100%   Weight:      Height:      PainSc:   0-No pain     Isolation Precautions No active isolations  Medications Medications  sodium chloride flush (NS) 0.9 % injection 3 mL (has no administration in time range)  pantoprazole (PROTONIX) injection 40 mg (has no administration in time range)  0.9 %  sodium chloride infusion (has no administration in time range)  allopurinol (ZYLOPRIM) tablet 150 mg (has no administration in time range)  cloNIDine (CATAPRES) tablet 0.1 mg (has no administration in time range)  folic acid (FOLVITE) tablet 1 mg (has no administration in time range)  sodium chloride 0.9 % bolus 1,000 mL (has no administration in  time  range)  thiamine (VITAMIN B-1) tablet 100 mg (has no administration in time range)  labetalol (NORMODYNE) injection 20 mg (20 mg Intravenous Given 03/20/19 2225)    Mobility walks with device Low fall risk   Focused Assessments GI Bleed   R Recommendations: See Admitting Provider Note  Report given to:   Additional Notes: N/A

## 2019-03-21 NOTE — Consult Note (Addendum)
CARDIOLOGY CONSULT NOTE  Patient ID: Robert Hamilton MRN: [id number] DOB/AGE: 83/18/1935 83 y.o.  Admit date: 03/20/2019 Referring Physician:  [facility] hospitalist Primary Physician:  Maryella Shivers, MD Reason for Consultation: Ventricular tachycardia  HPI:   85 y.o. African-American male  with hypertension, CKD, tobacco abuse, h/o prostatectomy, h/o diverticulosis, admitted with lower GI bleeding. Cardiology consulted for ventricular tachyardia.  He has been seen by GI Dr. Benson Norway. PRBC transfusion and CTA recommended for possible recurrent diverticular bleed.   While in the hospital, patient has had multiple episodes of nonsustained ventricular tachycardia, without any symptoms of chest pain, shortness of breath, presyncope or syncope.  Patient is unaware of any prior cardiac history.  That said, he reports an episode of syncope in January for which she was admitted in [facility].  He reports that he was drinking heavily prior to this event, and that he was told that his syncope was related to his alcohol consumption.  He has not had any alcohol since this episode.  I do not have any records of this hospitalization.  I am unaware if patient had any ventricular tachycardia during that hospitalization.  Patient does report that he was informed by his primary care provider in [place] that he had arrhythmias, but no further work-up was performed.  Past Medical History:  Diagnosis Date  . Anemia   . Cancer ([place])   . Chronic kidney disease   . Diverticulosis   . GERD (gastroesophageal reflux disease)   . Hypertension      Past Surgical History:  Procedure Laterality Date  . HERNIA REPAIR    . PROSTATE SURGERY    . PROSTATECTOMY       History reviewed. No pertinent family history.   Social History: Social History   Socioeconomic History  . Marital status: Divorced    Spouse name: Not on file  . Number of children: Not on file  . Years of education: Not on file  . Highest  education level: Not on file  Occupational History  . Not on file  Social Needs  . Financial resource strain: Not on file  . Food insecurity:    Worry: Not on file    Inability: Not on file  . Transportation needs:    Medical: Not on file    Non-medical: Not on file  Tobacco Use  . Smoking status: Current Every Day Smoker    Types: Cigarettes  . Smokeless tobacco: Never Used  Substance and Sexual Activity  . Alcohol use: Yes  . Drug use: No  . Sexual activity: Yes  Lifestyle  . Physical activity:    Days per week: Not on file    Minutes per session: Not on file  . Stress: Not on file  Relationships  . Social connections:    Talks on phone: Not on file    Gets together: Not on file    Attends religious service: Not on file    Active member of club or organization: Not on file    Attends meetings of clubs or organizations: Not on file    Relationship status: Not on file  . Intimate partner violence:    Fear of current or ex partner: Not on file    Emotionally abused: Not on file    Physically abused: Not on file    Forced sexual activity: Not on file  Other Topics Concern  . Not on file  Social History Narrative  . Not on file     Medications  Prior to Admission  Medication Sig Dispense Refill Last Dose  . allopurinol (ZYLOPRIM) 300 MG tablet Take 150 mg by mouth daily.   03/20/2019 at Unknown time  . cloNIDine (CATAPRES) 0.1 MG tablet Take 0.1 mg by mouth 2 (two) times a day.   03/20/2019 at Unknown time  . folic acid (FOLVITE) 1 MG tablet Take 1 mg by mouth daily.   03/20/2019 at Unknown time  . pantoprazole (PROTONIX) 40 MG tablet Take 1 tablet (40 mg total) by mouth daily. 30 tablet 0 03/20/2019 at Unknown time  . thiamine (VITAMIN B-1) 100 MG tablet Take 100 mg by mouth daily.   03/20/2019 at Unknown time    Review of Systems  Constitution: Negative for decreased appetite, malaise/fatigue, weight gain and weight loss.  HENT: Negative for congestion.   Eyes:  Negative for visual disturbance.  Cardiovascular: Negative for chest pain, dyspnea on exertion, leg swelling, palpitations and syncope.  Respiratory: Negative for shortness of breath.   Endocrine: Negative for cold intolerance.  Hematologic/Lymphatic: Does not bruise/bleed easily.  Skin: Negative for itching and rash.  Musculoskeletal: Negative for myalgias.  Gastrointestinal: Positive for hematochezia. Negative for abdominal pain, nausea and vomiting.  Genitourinary: Negative for dysuria.  Neurological: Negative for dizziness and weakness.  Psychiatric/Behavioral: The patient is not nervous/anxious.   All other systems reviewed and are negative.     Physical Exam: Physical Exam  Constitutional: He is oriented to person, place, and time. He appears well-developed and well-nourished. No distress.  HENT:  Head: Normocephalic and atraumatic.  Eyes: Pupils are equal, round, and reactive to light. Conjunctivae are normal.  Neck: No JVD present.  Cardiovascular: Normal rate, regular rhythm and intact distal pulses.  Pulmonary/Chest: Effort normal and breath sounds normal. He has no wheezes. He has no rales.  Abdominal: Soft. Bowel sounds are normal. There is no rebound.  Musculoskeletal:        General: No edema.  Lymphadenopathy:    He has no cervical adenopathy.  Neurological: He is alert and oriented to person, place, and time. No cranial nerve deficit.  Skin: Skin is warm and dry.  Psychiatric: He has a normal mood and affect.  Nursing note and vitals reviewed.    Labs:   Lab Results  Component Value Date   WBC 6.8 03/21/2019   HGB 8.8 (L) 03/21/2019   HCT 27.7 (L) 03/21/2019   MCV 86.2 03/21/2019   PLT 135 (L) 03/21/2019    Recent Labs  Lab 03/20/19 2206 03/21/19 0518  NA 141 140  K 3.8 4.0  CL 110 111  CO2 20* 20*  BUN 15 15  CREATININE 1.68* 1.23  CALCIUM 9.1 8.3*  PROT 6.8  --   BILITOT 0.7  --   ALKPHOS 89  --   ALT 14  --   AST 18  --   GLUCOSE 132*  98    Lipid Panel  No results found for: CHOL, TRIG, HDL, CHOLHDL, VLDL, LDLCALC  BNP (last 3 results) Recent Labs    03/20/19 2206  BNP 281.5*    HEMOGLOBIN A1C No results found for: HGBA1C, MPG  Cardiac Panel (last 3 results) Recent Labs    03/20/19 2206  TROPONINI <0.03    Lab Results  Component Value Date   TROPONINI <0.03 03/20/2019     TSH No results for input(s): TSH in the last 8760 hours.    Radiology: No results found.  Scheduled Meds: . allopurinol  150 mg Oral Daily  . cloNIDine  0.1 mg Oral BID  . folic acid  1 mg Oral Daily  . [START ON 03/22/2019] pantoprazole  40 mg Oral Daily  . sodium chloride flush  3 mL Intravenous Q12H  . thiamine  100 mg Oral Daily   Continuous Infusions: . sodium chloride     PRN Meds:.  CARDIAC STUDIES:  EKG 03/21/2019: Sinus rhythm, first degree AV block, frequent PVC's  Echocardiogram 03/21/2019: Echocardiogram 03/21/2019 (My independent read): Normal size LV. Mod LVH. EF 60-65%. Inadequate Doppler date for diastolic assessment. Normal RV. Mild to mod MR.  Mod RA dilatation. Prominent Ch[place] network seen. Normal RAP.   Assessment & Recommendations:  85 y.o. African-American male  with hypertension, CKD, tobacco abuse, h/o prostatectomy, h/o diverticulosis, admitted with lower GI bleeding. Cardiology consulted for ventricular tachyardia.  Nonsustained ventricular tachycardia: Predominantly monomorphic.  Structurally normal heart. EF 60-65%.  Patient remains asymptomatic from these episodes.  I will obtain nuclear stress test to rule out ischemia.  Stop clonidine.  Start metoprolol 25 mg twice daily.  Monitor for ventricular arrhythmias during the hospitalization.  If he continues to have nonsustained or sustained episodes while on beta-blocker, I will obtain EP evaluation and consideration for antiarrhythmic therapy versus ablation.  Rectal bleed:  Management as per primary team.   [name],  MD 03/21/2019, 3:35 PM [facility] Pager: [phone] Office: [phone] If no answer Cell [phone]

## 2019-03-21 NOTE — H&P (Signed)
History and Physical   Robert Hamilton EVO:[id number] DOB: 1934/10/07 DOA: 03/20/2019  PCP: Maryella Shivers, MD  Sources of information includes EMR review, discussion with the emergency medicine team, and discussion with the patient  Chief Complaint: bleeding per rectum  HPI: This is an 85 year old man with medical problems including hypertension, chronic any disease with baseline creatinine of ~1.2, history of prostatectomy, ongoing smoking and history of diverticulosis and GI bleeds presenting with bleeding per rectum.  Patient report this episode of rectal bleeding onset of 03/20/2019.  He notes 2 bowel movements of bright red blood prior to emergency department arrival.  He reports having some presyncope.  Denies chest pain, shortness of breath.  Reports at baseline, he is ambulatory with a single prong cane and lives independently.  His daughter [name] lives close by.  He denies recent NSAID use.  Was admitted and December 2019 with concerns for lower GI bleed as well.  At that time, GI consult service reported colonoscopies in 2010 in 2016 which revealed diverticulosis, colonoscopy was considered but ultimately not pursued.  Patient denies using aspirin or anticoagulants.  He reports pain most of his time watching television.  EMS reports in route had some ventricular ectopy with ventricular tachycardia, managed with lidocaine.  Patient denies any fevers, chills, nausea, vomiting.  Denies alcohol use.  He thinks he has had a blood transfusion but cannot recall the details.  ED Course: In the emergency department vital signs remarkable for initially systolic blood pressure 299.  Heart rate 110.  Vital signs after giving IV labetalol 20 mg improved to systolic in the 371I, heart rate normalized.  CBC remarkable for hemoglobin 11.9  (note upon discharge on November 21, 2018 hemoglobin 11.4), normal platelets.  CMP remarkable for creatinine of 1.7.  Magnesium 1.8.  Troponin undetectable.  EKG  with sinus rhythm and PVC.  The emergency medicine team discussed the case with cardiology who recommended ongoing monitoring on telemetry and to consider re-consult if significant arrhythmia was to occur. Hospital medicine was consulted for further management.  Review of Systems: A complete ROS was obtained; pertinent positives negatives are denoted in the HPI. Otherwise, all systems are negative.   Past Medical History:  Diagnosis Date   Anemia    Cancer ([place])    Chronic kidney disease    Diverticulosis    GERD (gastroesophageal reflux disease)    Hypertension    Social History   Socioeconomic History   Marital status: Divorced    Spouse name: Not on file   Number of children: Not on file   Years of education: Not on file   Highest education level: Not on file  Occupational History   Not on file  Social Needs   Financial resource strain: Not on file   Food insecurity:    Worry: Not on file    Inability: Not on file   Transportation needs:    Medical: Not on file    Non-medical: Not on file  Tobacco Use   Smoking status: Current Every Day Smoker    Types: Cigarettes   Smokeless tobacco: Never Used  Substance and Sexual Activity   Alcohol use: Yes   Drug use: No   Sexual activity: Yes  Lifestyle   Physical activity:    Days per week: Not on file    Minutes per session: Not on file   Stress: Not on file  Relationships   Social connections:    Talks on phone: Not on file  Gets together: Not on file    Attends religious service: Not on file    Active member of club or organization: Not on file    Attends meetings of clubs or organizations: Not on file    Relationship status: Not on file   Intimate partner violence:    Fear of current or ex partner: Not on file    Emotionally abused: Not on file    Physically abused: Not on file    Forced sexual activity: Not on file  Other Topics Concern   Not on file  Social History Narrative   Not  on file   He is a retired Dealer, 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  Family hx: Father died of unknown causes, mother died of a myocardial infarction.  Physical Exam: Vitals:   03/20/19 2245 03/20/19 2300 03/20/19 2315 03/20/19 2330  BP: (!) 141/87 (!) 152/91 (!) 146/91 (!) 123/105  Pulse:    83  Resp: 17 19 13 16   SpO2:    100%  Weight:      Height:       General: Appears calm and comfortable, elderly black man ENT: Grossly normal hearing, MMM.  No scleral icterus. Cardiovascular: RRR. No M/R/G. No LE edema.  Respiratory: CTA bilaterally. No wheezes or crackles. Normal respiratory effort.  Breathing room air. Abdomen: Soft, non-tender.  No guarding or rebound.  Rectal exam deferred due to emergency medicine team reporting bright red blood per rectum on their exam.  Of note, shortly after interview the patient had a bowel movement which I personally viewed and it was overtly bloody. Skin: No rash or induration seen on limited exam. Musculoskeletal: Grossly normal tone BUE/BLE. Appropriate ROM.  Psychiatric: Grossly normal mood and affect. Neurologic: Moves all extremities in coordinated fashion, answering questions appropriately.  I have personally reviewed the following labs, culture data, and imaging studies.  Assessment/Plan:  #Acute GI bleeding, suspected lower source Course: Patient with history of lower GI bleeding from suspected diverticulosis in the past. No colonoscopy records in our system. Admitted for similar concerns in 11/2018.  Presents with 1 day history of BRBPR with associated light-headedness.  Upon admission, BRBPR bowel movement personally visualized. Hb of 11.9. Initially tachycardic (to 110 bpm) A/P: Clinically consistent with lower GI bleed etiology given hematochezia. Differential includes diverticular bleeding (most likely) among others (vascular, inflammatory, neoplastic). His HR has normalized. Will provide fluid resuscitation with 1 L NS followed by 75 cc q hr x 10  hrs, then re-assess. I have asked nursing to obtain 2 large bore PIVs. Type and screen. I consented the patient for blood, he is agreeable if needed.  Will transfuse to maintain hb > 7.  NPO. Plan to consult GI in AM for consideration of colonoscopy. Although upper GI source felt less likely, PPI IV BID until definitively ruled out.  PT / PTT ordered to ensure no further supportive measures should be undertaken.  #Other problems: -Ventricular tachycardia: occurred while in route, no overt electrolyte abnormality; suspect non-sustained, monitor with telemetry -Hx of gout: not active, continue home allopurinol -Acute kidney injury superimposed on CKD: On admission Cr of 1.7 (bCR of 1.2), will continue to monitor with fluid resuscitation efforts -HTN: suspect long-standing, in a note dating back to 05/2017, his ambulatory SBP was 170 mm Hg, will continue home clonidine for now -Supplements: continue home folic acid and B1 -Hx of prostatectomy: other notes report prior prostate cancer -Smoking: patient reports minimal amount, declines nicotine patch  DVT prophylaxis: SCDs given bleeding  Code Status: Full code, discussion on admission Disposition Plan: Anticipate D/C home when stable Consults called: none, plan to consult GI in AM Admission status: admit to hospital medicine service, telemetry   Cheri Rous, MD [facility] [phone]  If 7PM-7AM, please contact night-coverage [email or web address] Password [email or web address]  This document was created using the aid of voice recognition / dication software.

## 2019-03-21 NOTE — Progress Notes (Addendum)
Patient admitted after midnight.  See full H&P for complete details.  Patient reports that he has had previous resection of part of his colon in [place].  Yesterday he had 3 bloody bowel movements prior to arrival in 2 while in the emergency department.  There was note of patient having an episode of ventricular tachycardia, but telemetry overnight only notes intermittent PVCs. Denies having any further bowel movements this morning or complaints of chest pain.  Suspecting likely diverticular bleed.  Dr. Benson Norway of gastroenterology was consulted.  Creatinine appears back to baseline.  Hemoglobin is dropped from 11.9 g/dL down to 9.8 g/dL.  Will continue to monitor blood counts and transfuse blood products as needed.  Blood pressures noted to be significantly elevated which clonidine had been restarted.  Will follow-up gastroenterology recommendations.  Addendum: Patient with reports of bloody bowel movement this a.m.  Repeat hemoglobin down to 8.8 g/dL.  Telemetry noted 24 beats of ventricular tachycardia this afternoon at around 2:50pm.  Denies having any chest pain during episode.  Potassium within normal limits.  Check stat EKG, echocardiogram, magnesium, and troponin level.  Will formally consult cardiology Dr. Virgina Jock given patient has had 2 subsequent episodes of ventricular tachycardia that appear to be prolonged.  Patient reports continued bleeding will go ahead and order CT angiogram of the abdomen.

## 2019-03-21 NOTE — ED Notes (Signed)
Attempted report 

## 2019-03-21 NOTE — Consult Note (Signed)
Consult Note for [facility]  Reason for Consult: Diverticular bleed Referring Physician: [facility]  Osvaldo Angst HPI: This is an 85 year old male with a PMH of diverticular bleed, HTN, CRI, and prostatectomy for prostate cancer admitted for hematochezia.  The bleeding started yesterday evening and it was similar to his prior event in 11/2018.  At that time his bleeding arrested quickly and there was not any severe drop in his HGB.  During this admission his HGB dropped down to 9.8 g.dL from 11.9 g/dL at the time of admission.  The patient is followed by Dr. Lyndel Safe and his last colonoscopy was in 2016 with findings of a pan diverticulosis.  In the ER the patient denied any issues with chest pain or SOB.  There was the report of presyncope prior to his arrival.  His last bloody bowel movement was this AM and it was smaller in volume.  Past Medical History:  Diagnosis Date  . Anemia   . Cancer ([place])   . Chronic kidney disease   . Diverticulosis   . GERD (gastroesophageal reflux disease)   . Hypertension     Past Surgical History:  Procedure Laterality Date  . HERNIA REPAIR    . PROSTATE SURGERY    . PROSTATECTOMY      History reviewed. No pertinent family history.  Social History:  reports that he has been smoking cigarettes. He has never used smokeless tobacco. He reports current alcohol use. He reports that he does not use drugs.  Allergies: No Known Allergies  Medications:  Scheduled: . allopurinol  150 mg Oral Daily  . cloNIDine  0.1 mg Oral BID  . folic acid  1 mg Oral Daily  . pantoprazole (PROTONIX) IV  40 mg Intravenous Q12H  . sodium chloride flush  3 mL Intravenous Q12H  . thiamine  100 mg Oral Daily   Continuous: . sodium chloride 75 mL/hr at 03/21/19 0414    Results for orders placed or performed during the hospital encounter of 03/20/19 (from the past 24 hour(s))  ABO/Rh     Status: None   Collection Time: 03/20/19 10:05 PM  Result Value Ref Range    ABO/RH(D)      B POS Performed at [facility] [address], [place], [place] [address]   Comprehensive metabolic panel     Status: Abnormal   Collection Time: 03/20/19 10:06 PM  Result Value Ref Range   Sodium 141 135 - 145 mmol/L   Potassium 3.8 3.5 - 5.1 mmol/L   Chloride 110 98 - 111 mmol/L   CO2 20 (L) 22 - 32 mmol/L   Glucose, Bld 132 (H) 70 - 99 mg/dL   BUN 15 8 - 23 mg/dL   Creatinine, Ser 1.68 (H) 0.61 - 1.24 mg/dL   Calcium 9.1 8.9 - 10.3 mg/dL   Total Protein 6.8 6.5 - 8.1 g/dL   Albumin 3.6 3.5 - 5.0 g/dL   AST 18 15 - 41 U/L   ALT 14 0 - 44 U/L   Alkaline Phosphatase 89 38 - 126 U/L   Total Bilirubin 0.7 0.3 - 1.2 mg/dL   GFR calc non Af Amer 37 (L) >60 mL/min   GFR calc Af Amer 43 (L) >60 mL/min   Anion gap 11 5 - 15  CBC     Status: Abnormal   Collection Time: 03/20/19 10:06 PM  Result Value Ref Range   WBC 8.4 4.0 - 10.5 K/uL   RBC 4.34  4.22 - 5.81 MIL/uL   Hemoglobin 11.9 (L) 13.0 - 17.0 g/dL   HCT 37.9 (L) 39.0 - 52.0 %   MCV 87.3 80.0 - 100.0 fL   MCH 27.4 26.0 - 34.0 pg   MCHC 31.4 30.0 - 36.0 g/dL   RDW 13.8 11.5 - 15.5 %   Platelets 163 150 - 400 K/uL   nRBC 0.0 0.0 - 0.2 %  Type and screen [place]     Status: None   Collection Time: 03/20/19 10:06 PM  Result Value Ref Range   ABO/RH(D) B POS    Antibody Screen NEG    Sample Expiration      03/23/2019 Performed at [facility], [place] [address], [place], [place] [address]   Troponin I - ONCE - STAT     Status: None   Collection Time: 03/20/19 10:06 PM  Result Value Ref Range   Troponin I <0.03 <0.03 ng/mL  Brain natriuretic peptide     Status: Abnormal   Collection Time: 03/20/19 10:06 PM  Result Value Ref Range   B Natriuretic Peptide 281.5 (H) 0.0 - 100.0 pg/mL  Magnesium     Status: None   Collection Time: 03/20/19 10:06 PM  Result Value Ref Range   Magnesium 1.8 1.7 - 2.4 mg/dL  Protime-INR     Status: None   Collection Time: 03/20/19 10:06 PM   Result Value Ref Range   Prothrombin Time 13.9 11.4 - 15.2 seconds   INR 1.1 0.8 - 1.2  APTT     Status: None   Collection Time: 03/20/19 10:06 PM  Result Value Ref Range   aPTT 36 24 - 36 seconds  CBC     Status: Abnormal   Collection Time: 03/21/19  5:18 AM  Result Value Ref Range   WBC 6.8 4.0 - 10.5 K/uL   RBC 3.56 (L) 4.22 - 5.81 MIL/uL   Hemoglobin 9.8 (L) 13.0 - 17.0 g/dL   HCT 30.7 (L) 39.0 - 52.0 %   MCV 86.2 80.0 - 100.0 fL   MCH 27.5 26.0 - 34.0 pg   MCHC 31.9 30.0 - 36.0 g/dL   RDW 14.0 11.5 - 15.5 %   Platelets 135 (L) 150 - 400 K/uL   nRBC 0.0 0.0 - 0.2 %  Basic metabolic panel     Status: Abnormal   Collection Time: 03/21/19  5:18 AM  Result Value Ref Range   Sodium 140 135 - 145 mmol/L   Potassium 4.0 3.5 - 5.1 mmol/L   Chloride 111 98 - 111 mmol/L   CO2 20 (L) 22 - 32 mmol/L   Glucose, Bld 98 70 - 99 mg/dL   BUN 15 8 - 23 mg/dL   Creatinine, Ser 1.23 0.61 - 1.24 mg/dL   Calcium 8.3 (L) 8.9 - 10.3 mg/dL   GFR calc non Af Amer 54 (L) >60 mL/min   GFR calc Af Amer >60 >60 mL/min   Anion gap 9 5 - 15     No results found.  ROS:  As stated above in the HPI otherwise negative.  Blood pressure (!) 126/59, pulse 63, temperature 97.8 F (36.6 C), temperature source Oral, resp. rate 20, height 5\' 9"  (1.753 m), weight 67.8 kg, SpO2 100 %.    PE: Gen: NAD, Alert and Oriented HEENT:  [place]/AT, EOMI Neck: Supple, no LAD Lungs: CTA Bilaterally CV: RRR without M/G/R ABM: Soft, NTND, +BS Ext: No C/C/E  Assessment/Plan: 1) Probable recurrent diverticular bleed. 2)  Pandiverticulosis. 3) Anemia.   The patient is stable.  If he rebleeds, a CTA will be most beneficial as this may allow for targeted embolization of the vessel.  Plan: 1) Follow HGB. 2) Transfuse as necessary. 3) CTA if there is continued severe bleeding. Correna Meacham D 03/21/2019, 9:32 AM

## 2019-03-21 NOTE — Progress Notes (Signed)
Patient had a run of V-Tach (24 beats per CCMD) Patient asymptomatic. MD made aware via text page.

## 2019-03-21 NOTE — Plan of Care (Signed)
  Problem: Clinical Measurements: Goal: Will remain free from infection Outcome: Completed/Met Goal: Respiratory complications will improve Outcome: Completed/Met   Problem: Coping: Goal: Level of anxiety will decrease Outcome: Completed/Met   Problem: Pain Managment: Goal: General experience of comfort will improve Outcome: Completed/Met   Problem: Skin Integrity: Goal: Risk for impaired skin integrity will decrease Outcome: Completed/Met

## 2019-03-22 ENCOUNTER — Inpatient Hospital Stay (HOSPITAL_COMMUNITY): Payer: Medicare Other

## 2019-03-22 DIAGNOSIS — K625 Hemorrhage of anus and rectum: Secondary | ICD-10-CM

## 2019-03-22 DIAGNOSIS — N189 Chronic kidney disease, unspecified: Secondary | ICD-10-CM

## 2019-03-22 DIAGNOSIS — I472 Ventricular tachycardia: Secondary | ICD-10-CM

## 2019-03-22 DIAGNOSIS — I4729 Other ventricular tachycardia: Secondary | ICD-10-CM

## 2019-03-22 DIAGNOSIS — Z8546 Personal history of malignant neoplasm of prostate: Secondary | ICD-10-CM

## 2019-03-22 DIAGNOSIS — N179 Acute kidney failure, unspecified: Secondary | ICD-10-CM | POA: Diagnosis present

## 2019-03-22 LAB — CBC
HCT: 28 % — ABNORMAL LOW (ref 39.0–52.0)
Hemoglobin: 8.9 g/dL — ABNORMAL LOW (ref 13.0–17.0)
MCH: 27.2 pg (ref 26.0–34.0)
MCHC: 31.8 g/dL (ref 30.0–36.0)
MCV: 85.6 fL (ref 80.0–100.0)
Platelets: 123 10*3/uL — ABNORMAL LOW (ref 150–400)
RBC: 3.27 MIL/uL — ABNORMAL LOW (ref 4.22–5.81)
RDW: 14 % (ref 11.5–15.5)
WBC: 5.7 10*3/uL (ref 4.0–10.5)
nRBC: 0 % (ref 0.0–0.2)

## 2019-03-22 LAB — BASIC METABOLIC PANEL
Anion gap: 9 (ref 5–15)
BUN: 12 mg/dL (ref 8–23)
CO2: 20 mmol/L — ABNORMAL LOW (ref 22–32)
Calcium: 8.7 mg/dL — ABNORMAL LOW (ref 8.9–10.3)
Chloride: 111 mmol/L (ref 98–111)
Creatinine, Ser: 1.2 mg/dL (ref 0.61–1.24)
GFR calc Af Amer: >60 mL/min (ref >60)
GFR calc non Af Amer: 56 mL/min — ABNORMAL LOW (ref >60)
Glucose, Bld: 90 mg/dL (ref 70–99)
Potassium: 3.7 mmol/L (ref 3.5–5.1)
Sodium: 140 mmol/L (ref 135–145)

## 2019-03-22 LAB — TROPONIN I: Troponin I: <0.03 ng/mL (ref <0.03)

## 2019-03-22 MED ORDER — HYDRALAZINE HCL 20 MG/ML IJ SOLN: 10.0000 mg | INTRAMUSCULAR | Status: DC | PRN

## 2019-03-22 MED ORDER — REGADENOSON 0.4 MG/5ML IV SOLN
0.4000 mg | Freq: Once | INTRAVENOUS | Status: AC
  Administered 2019-03-22: 0.4 mg via INTRAVENOUS
  Filled 2019-03-22: qty 5

## 2019-03-22 MED ORDER — TECHNETIUM TC 99M TETROFOSMIN IV KIT
30.0000 | PACK | Freq: Once | INTRAVENOUS | Status: AC | PRN
  Administered 2019-03-22: 11:00:00 30 via INTRAVENOUS

## 2019-03-22 MED ORDER — REGADENOSON 0.4 MG/5ML IV SOLN
INTRAVENOUS | Status: AC
  Filled 2019-03-22: qty 5

## 2019-03-22 MED ORDER — TECHNETIUM TC 99M TETROFOSMIN IV KIT
10.0000 | PACK | Freq: Once | INTRAVENOUS | Status: AC | PRN
  Administered 2019-03-22: 10 via INTRAVENOUS

## 2019-03-22 NOTE — Progress Notes (Addendum)
Progress Note    Robert Hamilton  XHB:[id number] DOB: [birth year]  DOA: 03/20/2019 PCP: Maryella Shivers, MD    Brief Narrative:   Chief complaint: Blood in stools  Medical records reviewed and are as summarized below:  Robert Hamilton is an 85 y.o. male with past medical history significant for HTN, CKD, tobacco abuse, GI bleeding, diverticulosis, prostate cancer s/p prostatectomy; who presents with complaints of bloody stools.  Assessment/Plan:   Principal Problem:   Lower GI bleeding Active Problems:   Cigarette smoker   Acute kidney injury superimposed on chronic kidney disease ([place])   History of prostate cancer   Ventricular tachycardia, non-sustained ([place])  1. Lower gastrointestinal bleeding likely diverticular, acute blood loss anemia:   History of GI bleeding from diverticulosis previously in the past.  Last admitted in 11/2018 for the same and during that hospitalization he had been evaluated by gastroenterology, but no colonoscopy had been performed.  On admission patient noted had 2 episodes of bright red blood per rectum.  Hemoglobin on admission noted to be 11.9, but dropped down to as low as 8.8 g/dL.  Gastroenterology was consulted, but recommended obtaining a CT angiogram a patient continued to have bleeding.  At this time blood counts have remained stable and patient has not required blood transfusion.  He had continued to report rectal bleeding and on 4/19 CT angiogram of the abdomen/pelvis was obtained but did not show any acute area of bleeding. -Discontinue continue IV fluids -Continue Protonix daily -Likely to need outpatient follow-up with gastroenterology -[facility] consultative services, will follow-up for any further recommendation  2.  Ventricular tachycardia, history of syncopal episode: Was noted to be in ventricular tachycardia requiring lidocaine in route with EMS.  Thereafter patient had had several episodes of nonsustained  ventricular tachycardia(VT).  Dr. Earnie Larsson ofcardiology was consulted on 4/19, and recommended discontinuation of clonidine and started patient on metoprolol.  Echocardiogram revealed EF was noted to be 60 to 65%(full impression below).  Question possibility of ischemia for which nuclear stress test was performed 4/20. patient had subsequent episode of nonsustained VT, but episodes appear to be less frequent.  Cardiology recommending monitoring for another 24-48 hours. patient may warrant EP cardiology consult if symptoms persist.  Daughter made note of previous history of syncope on 12/2018 which had initially been thought to be related to alcohol and methadone. -Follow-up nuclear stress test -Continue to monitor electrolytes replace as needed -Continue metoprolol -Recommend patient should not drive -[place] [facility] cardiology consultative services, will follow-up for further recommendations -Follow-up telemetry overnight   3.  Essential hypertension: Blood pressures currently 145/96 ~152/99.  Diastolic still elevated.  Patient had been on clonidine at home.  However, clonidine discontinued and patient started on metoprolol 25 mg twice daily as noted above. -Continue metoprolol -Hydralazine IV as needed for elevated pressures  4.  Acute kidney injury superimposed on chronic kidney disease stage II: Resolved.  On admission creatinine elevated at 1.68, but baseline creatinine noted to be around 1.20.  Patient was given IV fluids and blood counts monitored.  Suspect symptoms likely secondary to blood loss.  Creatinine currently back at baseline of 1.2. -Continue to monitor  5.  Prostate cancer s/p prostatectomy  6.  Tobacco abuse: Patient reports that he does not smoke much, and declined nicotine patch when offered. -Continue to counsel on the need of cessation of tobacco  7.  Incidental finding on echocardiogram of mobile filamentous density in the RA that likely respresents a Chiari network. -  Per  cardiology for further work-up  Body mass index is 22.17 kg/m.   Family Communication/Anticipated D/C date and plan/Code Status   DVT prophylaxis:SCDs Code Status: Full Code.  Family Communication: No family present at bedside Disposition Plan:   Medical Consultants:    Cardiology Dr. Virgina Jock  Gastroenterology- Dr. Benson Norway   Anti-Infectives:    None  Subjective:    Reports that he had a small amount of blood in his stool last night. Objective:    Vitals:   03/22/19 0907 03/22/19 0908 03/22/19 0909 03/22/19 1017  BP: (!) 145/96 (!) 155/93 (!) 150/91 (!) 152/99  Pulse:    74  Resp:      Temp:      TempSrc:      SpO2:    99%  Weight:      Height:        Intake/Output Summary (Last 24 hours) at 03/22/2019 1128 Last data filed at 03/22/2019 1035 Gross per 24 hour  Intake 2141.94 ml  Output 1425 ml  Net 716.94 ml   Filed Weights   03/20/19 2157 03/21/19 0138 03/22/19 0222  Weight: 73.5 kg 67.8 kg 68.1 kg    Exam: Constitutional: Elderly male in NAD, calm, comfortable Eyes: PERRL, lids and conjunctivae normal ENMT: Mucous membranes are dry. Posterior pharynx clear of any exudate or lesions. Poor dentition.  Hard of hearing. Neck: normal, supple, no masses, no thyromegaly Respiratory: clear to auscultation bilaterally, no wheezing, no crackles. Normal respiratory effort. No accessory muscle use.  Cardiovascular: Regular rate and rhythm, no murmurs / rubs / gallops. No extremity edema. 2+ pedal pulses. No carotid bruits.  Abdomen: no tenderness, no masses palpated. No hepatosplenomegaly. Bowel sounds positive.  Musculoskeletal: no clubbing / cyanosis. No joint deformity upper and lower extremities. Good ROM, no contractures. Normal muscle tone.  Skin: no rashes, lesions, ulcers. No induration Neurologic: CN 2-12 grossly intact. Sensation intact, DTR normal. Strength 5/5 in all 4.  Psychiatric: Normal judgment and insight. Alert and oriented x 3. Normal mood.     Data Reviewed:   I have personally reviewed following labs and imaging studies:  Labs: Labs show the following:   Basic Metabolic Panel: Recent Labs  Lab 03/20/19 2206 03/21/19 0518 03/21/19 1514 03/22/19 0338  NA 141 140  --  140  K 3.8 4.0  --  3.7  CL 110 111  --  111  CO2 20* 20*  --  20*  GLUCOSE 132* 98  --  90  BUN 15 15  --  12  CREATININE 1.68* 1.23  --  1.20  CALCIUM 9.1 8.3*  --  8.7*  MG 1.8  --  1.7  --    GFR Estimated Creatinine Clearance: 45.7 mL/min (by C-G formula based on SCr of 1.2 mg/dL). Liver Function Tests: Recent Labs  Lab 03/20/19 2206  AST 18  ALT 14  ALKPHOS 89  BILITOT 0.7  PROT 6.8  ALBUMIN 3.6   No results for input(s): LIPASE, AMYLASE in the last 168 hours. No results for input(s): AMMONIA in the last 168 hours. Coagulation profile Recent Labs  Lab 03/20/19 2206  INR 1.1    CBC: Recent Labs  Lab 03/20/19 2206 03/21/19 0518 03/21/19 1142 03/21/19 2142 03/22/19 0338  WBC 8.4 6.8  --   --  5.7  HGB 11.9* 9.8* 8.8* 8.8* 8.9*  HCT 37.9* 30.7* 27.7* 28.2* 28.0*  MCV 87.3 86.2  --   --  85.6  PLT 163 135*  --   --  123*   Cardiac Enzymes: Recent Labs  Lab 03/20/19 2206 03/21/19 1514 03/21/19 2104 03/22/19 0338  TROPONINI <0.03 <0.03 <0.03 <0.03   BNP (last 3 results) No results for input(s): PROBNP in the last 8760 hours. CBG: No results for input(s): GLUCAP in the last 168 hours. D-Dimer: No results for input(s): DDIMER in the last 72 hours. Hgb A1c: No results for input(s): HGBA1C in the last 72 hours. Lipid Profile: No results for input(s): CHOL, HDL, LDLCALC, TRIG, CHOLHDL, LDLDIRECT in the last 72 hours. Thyroid function studies: No results for input(s): TSH, T4TOTAL, T3FREE, THYROIDAB in the last 72 hours.  Invalid input(s): FREET3 Anemia work up: No results for input(s): VITAMINB12, FOLATE, FERRITIN, TIBC, IRON, RETICCTPCT in the last 72 hours. Sepsis Labs: Recent Labs  Lab 03/20/19 2206  03/21/19 0518 03/22/19 0338  WBC 8.4 6.8 5.7    Microbiology No results found for this or any previous visit (from the past 240 hour(s)).  Procedures and diagnostic studies:  Echocardiogram 03/21/2019: Impression  1. The left ventricle has normal systolic function with an ejection fraction of 60-65%. The cavity size was normal. There is moderate concentric left ventricular hypertrophy. Left ventricular diastolic function could not be evaluated due to  nondiagnostic images.  2. The right ventricle has normal systolic function. The cavity was normal. There is no increase in right ventricular wall thickness.  3. Right atrial size was moderately dilated.  4. The aortic valve is tricuspid. Aortic valve regurgitation was not assessed by color flow Doppler.  5. There is a mobile filamentous density in the RA that likely respresents a Chiari network.   Ct Angio Abd/pel W/ And/or W/o  Result Date: 03/22/2019 CLINICAL DATA:  Hematochezia and history of lower GI bleed secondary to diverticulosis. EXAM: CT ANGIOGRAPHY ABDOMEN AND PELVIS WITH CONTRAST TECHNIQUE: Multidetector CT imaging of the abdomen and pelvis was performed using the standard protocol during bolus administration of intravenous contrast. Multiplanar reconstructed images and MIPs were obtained and reviewed to evaluate the vascular anatomy. CONTRAST:  149mL OMNIPAQUE IOHEXOL 350 MG/ML SOLN COMPARISON:  None. FINDINGS: VASCULAR Aorta: The aorta shows mild atherosclerosis without evidence of aneurysm, stenosis or dissection. Celiac: Normally patent. Normal distal branch vessel anatomy and patency. No gastric or duodenal bleeding source identified. SMA: Normally patent. Distal branches demonstrate normal patency. On arterial and venous phases of imaging, no active bleeding source is identified from the small bowel or colon. Renals: Both kidneys are supplied by dominant renal arteries and small accessory lower pole arteries. No evidence of  significant renal artery stenosis. IMA: Normally patent. Inflow: Bilateral iliac arteries demonstrate normal patency. No stenosis, aneurysmal disease or significant atherosclerosis. Proximal Outflow: Bilateral common femoral arteries and femoral bifurcations are widely patent. Veins: Venous phase imaging demonstrates normal patency of visualized mesenteric veins, splenic vein and portal vein. The IVC, bilateral renal veins, iliac veins and common femoral veins demonstrate normal patency. During the venous phase, no identifiable contrast extravasation is identified into the gastrointestinal tract. Review of the MIP images confirms the above findings. NON-VASCULAR Lower chest: No acute abnormality. Hepatobiliary: No focal liver abnormality is seen. No gallstones, gallbladder wall thickening, or biliary dilatation. Pancreas: Unremarkable. No pancreatic ductal dilatation or surrounding inflammatory changes. Spleen: Normal in size without focal abnormality. Adrenals/Urinary Tract: Adrenal glands are unremarkable. Kidneys are normal, without renal calculi, focal lesion, or hydronephrosis. Bladder is unremarkable. Stomach/Bowel: No bleeding source is identified within the gastrointestinal tract by CTA. The bowel shows no evidence of obstruction, ileus or inflammation. There  are mild scattered diverticula throughout much of the colon without evidence diverticulitis. The appendix is unremarkable. No free intraperitoneal air is identified. Lymphatic: No enlarged lymph nodes identified. Reproductive: Prostate is unremarkable. Other: Evidence of prior left inguinal hernia repair. No significant hernias are identified. No ascites or focal abscess. Musculoskeletal: Degenerative disc disease at L5-S1. No focal bone lesion or fracture identified. IMPRESSION: 1. No bleeding source identified in the gastrointestinal tract by CTA. 2. Mild diverticulosis of the colon without evidence of acute diverticulitis. Electronically Signed   By:  Aletta Edouard M.D.   On: 03/22/2019 09:55    Medications:   . allopurinol  150 mg Oral Daily  . folic acid  1 mg Oral Daily  . metoprolol tartrate  25 mg Oral BID  . pantoprazole  40 mg Oral Daily  . sodium chloride flush  3 mL Intravenous Q12H  . thiamine  100 mg Oral Daily   Continuous Infusions:   LOS: 1 day   Rondell A Smith  [facility]   *Please refer to [email or web address], password [email or web address] to get updated schedule on who will round on this patient, as hospitalists switch teams weekly. If 7PM-7AM, please contact night-coverage at [email or web address], password [email or web address] for any overnight needs.

## 2019-03-22 NOTE — Progress Notes (Signed)
Subjective:  Feels well this morning No chest pain. No bleeding overnight, although appears to have had a bloody bowel movement this morning  Objective:  Vital Signs in the last 24 hours: Temp:  [97.8 F (36.6 C)-98 F (36.7 C)] 97.8 F (36.6 C) (04/20 0440) Pulse Rate:  [59-88] 74 (04/20 1017) Resp:  [16-20] 16 (04/20 0440) BP: (104-155)/(56-99) 152/99 (04/20 1017) SpO2:  [95 %-100 %] 99 % (04/20 1017) Weight:  [68.1 kg] 68.1 kg (04/20 0222)  Intake/Output from previous day: 04/19 0701 - 04/20 0700 In: 1886.9 [P.O.:600; I.V.:1236.9; IV Piggyback:50] Out: 0109 [Urine:1625]  Physical Exam  Constitutional: He is oriented to person, place, and time. He appears well-developed and well-nourished. No distress.  HENT:  Head: Normocephalic and atraumatic.  Eyes: Pupils are equal, round, and reactive to light. Conjunctivae are normal.  Neck: No JVD present.  Cardiovascular: Normal rate, regular rhythm and intact distal pulses.  Pulmonary/Chest: Effort normal and breath sounds normal. He has no wheezes. He has no rales.  Abdominal: Soft. Bowel sounds are normal. There is no rebound.  Musculoskeletal:        General: No edema.  Lymphadenopathy:    He has no cervical adenopathy.  Neurological: He is alert and oriented to person, place, and time. No cranial nerve deficit.  Skin: Skin is warm and dry.  Psychiatric: He has a normal mood and affect.  Nursing note and vitals reviewed.    Lab Results: BMP Recent Labs    03/20/19 2206 03/21/19 0518 03/22/19 0338  NA 141 140 140  K 3.8 4.0 3.7  CL 110 111 111  CO2 20* 20* 20*  GLUCOSE 132* 98 90  BUN 15 15 12   CREATININE 1.68* 1.23 1.20  CALCIUM 9.1 8.3* 8.7*  GFRNONAA 37* 54* 56*  GFRAA 43* >60 >60    CBC Recent Labs  Lab 03/22/19 0338  WBC 5.7  RBC 3.27*  HGB 8.9*  HCT 28.0*  PLT 123*  MCV 85.6  MCH 27.2  MCHC 31.8  RDW 14.0    HEMOGLOBIN A1C No results found for: HGBA1C, MPG  Cardiac Panel (last 3  results) Recent Labs    03/21/19 1514 03/21/19 2104 03/22/19 0338  TROPONINI <0.03 <0.03 <0.03    BNP (last 3 results) Recent Labs    03/20/19 2206  BNP 281.5*    TSH No results for input(s): TSH in the last 8760 hours.  Lipid Panel  No results found for: CHOL, TRIG, HDL, CHOLHDL, VLDL, LDLCALC, LDLDIRECT   Hepatic Function Panel Recent Labs    03/20/19 2206  PROT 6.8  ALBUMIN 3.6  AST 18  ALT 14  ALKPHOS 89  BILITOT 0.7   CARDIAC STUDIES:  EKG 03/21/2019: Sinus rhythm, first degree AV block, frequent PVC's  Echocardiogram 03/21/2019: Echocardiogram 03/21/2019 (My independent read): Normal size LV. Mod LVH. EF 60-65%. Inadequate Doppler date for diastolic assessment. Normal RV. Mild to mod MR.  Mod RA dilatation. Prominent Chiary network seen. Normal RAP.   Assessment & Recommendations:  85 y.o. African-American male  with hypertension, CKD, tobacco abuse, h/o prostatectomy, h/o diverticulosis, admitted with lower GI bleeding. Cardiology consulted for ventricular tachyardia.  Nonsustained ventricular tachycardia: Predominantly monomorphic.  Structurally normal heart. EF 60-65%.  Patient remains asymptomatic from these episodes.  Nuclear stress test formal report pending. No significant ischemia/infarction on my review. Ventricular tachycardia/ectopy improved since starting metoprolol tartarate 25 mg bid on 03/22/2019. (Received 2 doses so far) Recommend monitoring for another 24-48 hrs for any recurrence of VT.  If he continues to have nonsustained or sustained episodes while on beta-blocker, I will obtain EP evaluation and consideration for antiarrhythmic therapy versus ablation. I talked to patient's daughter Forde Radon and corroborated story of his "passing out" episode in 12/2018. Patient was reportedly found to have methadone and alcohol in his system, and his symptoms were attributed to that. patient or the daughter do not recollect any mention  about VT.   Rectal bleed:  Management as per primary team.  [name], M.D. 03/22/2019, 10:33 AM [facility] Pager: [phone] Office: [phone] If no answer: [phone]

## 2019-03-22 NOTE — Progress Notes (Signed)
Pt returned from stress, pt reports bm when he got back, pt flushed it so unable to view but pt says still a little bloody but better than when I came in, paged Dr Tamala Julian that pt is back from test

## 2019-03-22 NOTE — Progress Notes (Addendum)
Daily Rounding Note  03/22/2019, 12:07 PM  LOS: 1 day   SUBJECTIVE:   Chief complaint: painless hematochezia.      Small amount of blood with otherwise brown stool this AM.   Not dizzy when he gets up to walk to bathroom.  No abd pain.  On solid food.   Some SOB, esp when flat, not new and not worse.    OBJECTIVE:         Vital signs in last 24 hours:    Temp:  [97.8 F (36.6 C)-98 F (36.7 C)] 97.8 F (36.6 C) (04/20 0440) Pulse Rate:  [59-88] 74 (04/20 1017) Resp:  [16-20] 16 (04/20 0440) BP: (104-155)/(56-99) 152/99 (04/20 1017) SpO2:  [95 %-100 %] 99 % (04/20 1017) Weight:  [68.1 kg] 68.1 kg (04/20 0222) Last BM Date: 03/22/19 Filed Weights   03/20/19 2157 03/21/19 0138 03/22/19 0222  Weight: 73.5 kg 67.8 kg 68.1 kg   General: elderly.  Not ill looking   Heart: RRR Chest: clear bil.  Good BS and no cough or SOB Abdomen: soft, NT, ND.  Active BS  Extremities: no CCE.   Neuro/Psych:  Oriented x 3.  Appropriate.  No gross deficits.    Intake/Output from previous day: 04/19 0701 - 04/20 0700 In: 1886.9 [P.O.:600; I.V.:1236.9; IV Piggyback:50] Out: 1740 [Urine:1625]  Intake/Output this shift: Total I/O In: 549 [P.O.:240; I.V.:309] Out: -   Lab Results: Recent Labs    03/20/19 2206 03/21/19 0518 03/21/19 1142 03/21/19 2142 03/22/19 0338  WBC 8.4 6.8  --   --  5.7  HGB 11.9* 9.8* 8.8* 8.8* 8.9*  HCT 37.9* 30.7* 27.7* 28.2* 28.0*  PLT 163 135*  --   --  123*   BMET Recent Labs    03/20/19 2206 03/21/19 0518 03/22/19 0338  NA 141 140 140  K 3.8 4.0 3.7  CL 110 111 111  CO2 20* 20* 20*  GLUCOSE 132* 98 90  BUN 15 15 12   CREATININE 1.68* 1.23 1.20  CALCIUM 9.1 8.3* 8.7*   LFT Recent Labs    03/20/19 2206  PROT 6.8  ALBUMIN 3.6  AST 18  ALT 14  ALKPHOS 89  BILITOT 0.7   PT/INR Recent Labs    03/20/19 2206  LABPROT 13.9  INR 1.1   Hepatitis Panel No results for input(s):  HEPBSAG, HCVAB, HEPAIGM, HEPBIGM in the last 72 hours.  Studies/Results: Nm Myocar Multi W/spect W/wall Motion / Ef  Result Date: 03/22/2019 CLINICAL DATA:  Ventricular tachycardia. History of hypertension and chronic kidney disease. EXAM: MYOCARDIAL IMAGING WITH SPECT (REST AND PHARMACOLOGIC-STRESS) GATED LEFT VENTRICULAR WALL MOTION STUDY LEFT VENTRICULAR EJECTION FRACTION TECHNIQUE: Standard myocardial SPECT imaging was performed after resting intravenous injection of 10 mCi Tc-74m tetrofosmin. Subsequently, intravenous infusion of Lexiscan was performed under the supervision of the Cardiology staff. At peak effect of the drug, 30 mCi Tc-40m tetrofosmin was injected intravenously and standard myocardial SPECT imaging was performed. Quantitative gated imaging was also performed to evaluate left ventricular wall motion, and estimate left ventricular ejection fraction. COMPARISON:  None. FINDINGS: Perfusion: No decreased activity in the left ventricle on stress imaging to suggest reversible ischemia or infarction. Wall Motion: Suggestion of minimal septal and inferior hypokinesis. Left Ventricular Ejection Fraction: 40 % End diastolic volume 98 ml End systolic volume 58 ml IMPRESSION: 1. No reversible ischemia or infarction. 2. Minimal septal and inferior hypokinesis. 3. Left ventricular ejection fraction 40% 4. Non invasive risk  stratification*: Low *2012 Appropriate Use Criteria for Coronary Revascularization Focused Update: J Am Coll Cardiol. 9326;71(2):458-099. http://content.airportbarriers.com.aspx?articleid=[email or web address] Electronically Signed   By: Aletta Edouard M.D.   On: 03/22/2019 11:56   Ct Angio Abd/pel W/ And/or W/o  Result Date: 03/22/2019 CLINICAL DATA:  Hematochezia and history of lower GI bleed secondary to diverticulosis. EXAM: CT ANGIOGRAPHY ABDOMEN AND PELVIS WITH CONTRAST TECHNIQUE: Multidetector CT imaging of the abdomen and pelvis was performed using the standard protocol during bolus  administration of intravenous contrast. Multiplanar reconstructed images and MIPs were obtained and reviewed to evaluate the vascular anatomy. CONTRAST:  152mL OMNIPAQUE IOHEXOL 350 MG/ML SOLN COMPARISON:  None. FINDINGS: VASCULAR Aorta: The aorta shows mild atherosclerosis without evidence of aneurysm, stenosis or dissection. Celiac: Normally patent. Normal distal branch vessel anatomy and patency. No gastric or duodenal bleeding source identified. SMA: Normally patent. Distal branches demonstrate normal patency. On arterial and venous phases of imaging, no active bleeding source is identified from the small bowel or colon. Renals: Both kidneys are supplied by dominant renal arteries and small accessory lower pole arteries. No evidence of significant renal artery stenosis. IMA: Normally patent. Inflow: Bilateral iliac arteries demonstrate normal patency. No stenosis, aneurysmal disease or significant atherosclerosis. Proximal Outflow: Bilateral common femoral arteries and femoral bifurcations are widely patent. Veins: Venous phase imaging demonstrates normal patency of visualized mesenteric veins, splenic vein and portal vein. The IVC, bilateral renal veins, iliac veins and common femoral veins demonstrate normal patency. During the venous phase, no identifiable contrast extravasation is identified into the gastrointestinal tract. Review of the MIP images confirms the above findings. NON-VASCULAR Lower chest: No acute abnormality. Hepatobiliary: No focal liver abnormality is seen. No gallstones, gallbladder wall thickening, or biliary dilatation. Pancreas: Unremarkable. No pancreatic ductal dilatation or surrounding inflammatory changes. Spleen: Normal in size without focal abnormality. Adrenals/Urinary Tract: Adrenal glands are unremarkable. Kidneys are normal, without renal calculi, focal lesion, or hydronephrosis. Bladder is unremarkable. Stomach/Bowel: No bleeding source is identified within the  gastrointestinal tract by CTA. The bowel shows no evidence of obstruction, ileus or inflammation. There are mild scattered diverticula throughout much of the colon without evidence diverticulitis. The appendix is unremarkable. No free intraperitoneal air is identified. Lymphatic: No enlarged lymph nodes identified. Reproductive: Prostate is unremarkable. Other: Evidence of prior left inguinal hernia repair. No significant hernias are identified. No ascites or focal abscess. Musculoskeletal: Degenerative disc disease at L5-S1. No focal bone lesion or fracture identified. IMPRESSION: 1. No bleeding source identified in the gastrointestinal tract by CTA. 2. Mild diverticulosis of the colon without evidence of acute diverticulitis. Electronically Signed   By: Aletta Edouard M.D.   On: 03/22/2019 09:55   Scheduled Meds:  allopurinol  150 mg Oral Daily   folic acid  1 mg Oral Daily   metoprolol tartrate  25 mg Oral BID   pantoprazole  40 mg Oral Daily   sodium chloride flush  3 mL Intravenous Q12H   thiamine  100 mg Oral Daily   Continuous Infusions: PRN Meds:.hydrALAZINE   ASSESMENT:   *  Painless hematochezia.  Resolving.   Known diverticulosis.   CTA negative for active bleeding.  Confirms diverticulosis.   Suspect recurrent diverticular bleed.    *   Blood loss anemia.  Hgb 11.9 >> 8.9. No PRBCs to date.    *   Non-critical thrombocytopenia.  Dates to 11/2018.     PLAN   *  CBC in AM.  With drifting Hgb would keep inpt another night.  Azucena Freed  03/22/2019, 12:07 PM Phone [phone]    [facility] Attending   I have taken an interval history, reviewed the chart and examined the patient. I agree with the Advanced Practitioner's note, impression and recommendations.   Seems like resolving diverticular hemorrhage.  Home 1-2 d if stable and no more bleeding  Gatha Mayer, MD, [facility] 03/22/2019 3:02 PM Pager [phone]

## 2019-03-22 NOTE — Progress Notes (Signed)
Pt to stress test, no cp or sob, pt had 17 bt wide complex tachy at 0730, asx, Dr Harvest Forest  On unit and advised, no changes at this time

## 2019-03-23 DIAGNOSIS — K5731 Diverticulosis of large intestine without perforation or abscess with bleeding: Principal | ICD-10-CM

## 2019-03-23 DIAGNOSIS — D62 Acute posthemorrhagic anemia: Secondary | ICD-10-CM | POA: Diagnosis present

## 2019-03-23 DIAGNOSIS — D696 Thrombocytopenia, unspecified: Secondary | ICD-10-CM | POA: Diagnosis present

## 2019-03-23 LAB — CBC
HCT: 27.4 % — ABNORMAL LOW (ref 39.0–52.0)
Hemoglobin: 9 g/dL — ABNORMAL LOW (ref 13.0–17.0)
MCH: 28 pg (ref 26.0–34.0)
MCHC: 32.8 g/dL (ref 30.0–36.0)
MCV: 85.1 fL (ref 80.0–100.0)
Platelets: 126 10*3/uL — ABNORMAL LOW (ref 150–400)
RBC: 3.22 MIL/uL — ABNORMAL LOW (ref 4.22–5.81)
RDW: 13.9 % (ref 11.5–15.5)
WBC: 6.6 10*3/uL (ref 4.0–10.5)
nRBC: 0 % (ref 0.0–0.2)

## 2019-03-23 LAB — RAPID URINE DRUG SCREEN, HOSP PERFORMED
Amphetamines: NOT DETECTED
Barbiturates: NOT DETECTED
Benzodiazepines: NOT DETECTED
Cocaine: NOT DETECTED
Opiates: NOT DETECTED
Tetrahydrocannabinol: NOT DETECTED

## 2019-03-23 MED ORDER — METOPROLOL TARTRATE 50 MG PO TABS
50.0000 mg | ORAL_TABLET | Freq: Two times a day (BID) | ORAL | Status: DC
  Administered 2019-03-23 – 2019-03-24 (×2): 50 mg via ORAL
  Filled 2019-03-23 (×3): qty 1

## 2019-03-23 MED ORDER — POTASSIUM CHLORIDE CRYS ER 20 MEQ PO TBCR
30.0000 meq | EXTENDED_RELEASE_TABLET | ORAL | Status: AC
  Administered 2019-03-23: 30 meq via ORAL
  Filled 2019-03-23: qty 1

## 2019-03-23 MED ORDER — ACETAMINOPHEN 325 MG PO TABS
650.0000 mg | ORAL_TABLET | ORAL | Status: DC | PRN
  Administered 2019-03-23: 650 mg via ORAL
  Filled 2019-03-23: qty 2

## 2019-03-23 MED ORDER — MAGNESIUM OXIDE 400 (241.3 MG) MG PO TABS
400.0000 mg | ORAL_TABLET | Freq: Every day | ORAL | Status: DC
  Administered 2019-03-23 – 2019-03-24 (×2): 400 mg via ORAL
  Filled 2019-03-23 (×2): qty 1

## 2019-03-23 MED ORDER — POTASSIUM CHLORIDE CRYS ER 20 MEQ PO TBCR: 20.0000 meq | EXTENDED_RELEASE_TABLET | ORAL | Status: DC

## 2019-03-23 NOTE — Progress Notes (Signed)
Subjective:  Feels well this morning No chest pain.   Objective:  Vital Signs in the last 24 hours: Temp:  [98.1 F (36.7 C)-98.5 F (36.9 C)] 98.1 F (36.7 C) (04/21 0425) Pulse Rate:  [66-88] 67 (04/21 0425) Resp:  [18-19] 19 (04/20 2136) BP: (115-155)/(78-99) 144/86 (04/21 0425) SpO2:  [95 %-100 %] 100 % (04/21 0425)  Intake/Output from previous day: 04/20 0701 - 04/21 0700 In: 769 [P.O.:460; I.V.:309] Out: 650 [Urine:650]  Physical Exam  Constitutional: He is oriented to person, place, and time. He appears well-developed and well-nourished. No distress.  HENT:  Head: Normocephalic and atraumatic.  Eyes: Pupils are equal, round, and reactive to light. Conjunctivae are normal.  Neck: No JVD present.  Cardiovascular: Normal rate, regular rhythm and intact distal pulses.  Pulmonary/Chest: Effort normal and breath sounds normal. He has no wheezes. He has no rales.  Abdominal: Soft. Bowel sounds are normal. There is no rebound.  Musculoskeletal:        General: No edema.  Lymphadenopathy:    He has no cervical adenopathy.  Neurological: He is alert and oriented to person, place, and time. No cranial nerve deficit.  Skin: Skin is warm and dry.  Psychiatric: He has a normal mood and affect.  Nursing note and vitals reviewed.    Lab Results: BMP Recent Labs    03/20/19 2206 03/21/19 0518 03/22/19 0338  NA 141 140 140  K 3.8 4.0 3.7  CL 110 111 111  CO2 20* 20* 20*  GLUCOSE 132* 98 90  BUN 15 15 12   CREATININE 1.68* 1.23 1.20  CALCIUM 9.1 8.3* 8.7*  GFRNONAA 37* 54* 56*  GFRAA 43* >60 >60    CBC Recent Labs  Lab 03/23/19 0414  WBC 6.6  RBC 3.22*  HGB 9.0*  HCT 27.4*  PLT 126*  MCV 85.1  MCH 28.0  MCHC 32.8  RDW 13.9    HEMOGLOBIN A1C No results found for: HGBA1C, MPG  Cardiac Panel (last 3 results) Recent Labs    03/21/19 1514 03/21/19 2104 03/22/19 0338  TROPONINI <0.03 <0.03 <0.03    BNP (last 3 results) Recent Labs     03/20/19 2206  BNP 281.5*    TSH No results for input(s): TSH in the last 8760 hours.  Lipid Panel  No results found for: CHOL, TRIG, HDL, CHOLHDL, VLDL, LDLCALC, LDLDIRECT   Hepatic Function Panel Recent Labs    03/20/19 2206  PROT 6.8  ALBUMIN 3.6  AST 18  ALT 14  ALKPHOS 89  BILITOT 0.7   CARDIAC STUDIES:  EKG 03/21/2019: Sinus rhythm, first degree AV block, frequent PVC's  Nuclear stress test 03/22/2019: IMPRESSION: 1. No reversible ischemia or infarction.  2. Minimal septal and inferior hypokinesis.  3. Left ventricular ejection fraction 40%  4. Non invasive risk stratification*: Low   Echocardiogram 03/21/2019: Echocardiogram 03/21/2019 (My independent read): Normal size LV. Mod LVH. EF 60-65%. Inadequate Doppler date for diastolic assessment. Normal RV. Mild to mod MR.  Mod RA dilatation. Prominent Chiary network seen. Normal RAP.   Assessment & Recommendations:  85 y.o. African-American male  with hypertension, CKD, tobacco abuse, h/o prostatectomy, h/o diverticulosis, admitted with lower GI bleeding. Cardiology consulted for ventricular tachyardia.  Nonsustained ventricular tachycardia: Predominantly monomorphic.  Structurally normal heart. EF 60-65%.  Patient remains asymptomatic from these episodes.  Nuclear stress test with no ischemia/infarction. Although EF is reported 40% with minimal WMA, EF and wall motion is completely normal on echocardiogram.  He continues to have up  to 30 beat runs of VT. I requested consult from EP.  I talked to patient's daughter Forde Radon and corroborated story of his "passing out" episode in 12/2018. Patient was reportedly found to have methadone and alcohol in his system, and his symptoms were attributed to that. patient or the daughter do not recollect any mention about VT.   Rectal bleed:  Management as per primary team.  [name], M.D. 03/23/2019, 8:15 AM [facility],  [place] Pager: [phone] Office: [phone] If no answer: [phone]

## 2019-03-23 NOTE — Plan of Care (Signed)
  Problem: Education: Goal: Knowledge of General Education information will improve Description Including pain rating scale, medication(s)/side effects and non-pharmacologic comfort measures Outcome: Progressing   Problem: Health Behavior/Discharge Planning: Goal: Ability to manage health-related needs will improve Outcome: Progressing   Problem: Clinical Measurements: Goal: Ability to maintain clinical measurements within normal limits will improve Outcome: Progressing Goal: Diagnostic test results will improve Outcome: Progressing Goal: Cardiovascular complication will be avoided Outcome: Progressing   Problem: Activity: Goal: Risk for activity intolerance will decrease Outcome: Progressing   Problem: Nutrition: Goal: Adequate nutrition will be maintained Outcome: Progressing   Problem: Elimination: Goal: Will not experience complications related to bowel motility Outcome: Progressing Goal: Will not experience complications related to urinary retention Outcome: Progressing   Problem: Safety: Goal: Ability to remain free from injury will improve Outcome: Progressing

## 2019-03-23 NOTE — Progress Notes (Addendum)
          Daily Rounding Note  03/23/2019, 11:59 AM  LOS: 2 days   SUBJECTIVE:   Chief complaint: painless hematochezia.  Likley diverticular bleed     Brown stool this morning.  Feels good.  Not dizzy when he gets up to go to the bathroom. Is complaining of left shoulder pain.  This is a chronic recurring issue.  He tells me that it happens when it is going to rain.   OBJECTIVE:         Vital signs in last 24 hours:    Temp:  [98.1 F (36.7 C)-98.5 F (36.9 C)] 98.1 F (36.7 C) (04/21 0425) Pulse Rate:  [66-88] 74 (04/21 0923) Resp:  [18-19] 19 (04/20 2136) BP: (115-144)/(78-86) 138/85 (04/21 0923) SpO2:  [100 %] 100 % (04/21 0425) Last BM Date: 03/23/19 Filed Weights   03/20/19 2157 03/21/19 0138 03/22/19 0222  Weight: 73.5 kg 67.8 kg 68.1 kg   General: Lessened, alert.  Comfortable. Heart: RRR with intermittent runs of irregular beats. Chest: Clear bilaterally.  No labored breathing. Abdomen: Soft without tenderness.  Not distended.  Active bowel sounds. Extremities: No CCE. Neuro/Psych: Alert.  Oriented x3.  No gross deficits.   Lab Results: Recent Labs    03/21/19 0518  03/21/19 2142 03/22/19 0338 03/23/19 0414  WBC 6.8  --   --  5.7 6.6  HGB 9.8*   < > 8.8* 8.9* 9.0*  HCT 30.7*   < > 28.2* 28.0* 27.4*  PLT 135*  --   --  123* 126*   < > = values in this interval not displayed.    ASSESMENT:   *   Lower GI bleed, likely diverticular bleed. CTA 2 days ago negative for active bleeding. His GI physician is Dr. Johnnette Barrios.  Had colonoscopy in 2016.  *   Normocytic anemia.  Stable after initial 1 gm drop.  No transfusions to date.  *   Stable, chronic thrombocytopenia.  *    Syncope PTA.  Runs of V. Tach.  Cardiology is requesting EP consult.   PLAN   *    From GI standpoint he is cleared to discharge.  However I imagine he needs to be seen by EP before this.  GI fup prn.    *   I ordered extra  strength Tylenol as needed for his shoulder pain.  If he needs something beyond this they will need to contact the hospital physician.    Azucena Freed  03/23/2019, 11:59 AM Phone [place] Attending   I have taken an interval history, reviewed the chart and examined the patient. I agree with the Advanced Practitioner's note, impression and recommendations.   Resolved diverticular hemorrhage is working dx for now.  Needs f/u H/H at PCP and see GI prn I would not pursue a repeat colonoscopy (last in 2016) based upon what we know at this time but consider in future if recurrent bleeding.  Signing off  Gatha Mayer, MD, [facility] 03/23/2019 1:43 PM Pager [phone]

## 2019-03-23 NOTE — Progress Notes (Signed)
Progress Note    Robert Hamilton  ALP:[id number] DOB: November 26, 1934  DOA: 03/20/2019 PCP: Maryella Shivers, MD    Brief Narrative:   Chief complaint: Blood in stools  Medical records reviewed and are as summarized below:  Robert Hamilton is an 85 y.o. male with past medical history significant for HTN, CKD, tobacco abuse, GI bleeding, diverticulosis, prostate cancer s/p prostatectomy; who presents with complaints of bloody stools.  Assessment/Plan:   Principal Problem:   Lower GI bleeding Active Problems:   Cigarette smoker   Acute kidney injury superimposed on chronic kidney disease (HCC)   History of prostate cancer   Ventricular tachycardia, non-sustained (HCC)   Diverticulosis of colon with hemorrhage   Acute blood loss anemia  1. Lower gastrointestinal bleeding likely diverticular, acute blood loss anemia:   History of GI bleeding from diverticulosis previously in the past.  Last admitted in 11/2018 for the same and during that hospitalization he had been evaluated by gastroenterology, but no colonoscopy had been performed.  On admission patient noted had 2 episodes of bright red blood per rectum.  Hemoglobin on admission noted to be 11.9, but dropped down to as low as 8.8 g/dL.  Gastroenterology was consulted, but recommended obtaining a CT angiogram a patient continued to have bleeding.  At this time blood counts have remained stable and patient has not required blood transfusion.  He had continued to report rectal bleeding and on 4/19 CT angiogram of the abdomen/pelvis was obtained but did not show any acute area of bleeding.  Patient stools have remained nonbloody. -Discontinue IV fluids -Continue Protonix daily -Likely to need outpatient follow-up with gastroenterology -[facility] gastroenterology consultative services during hospitalization  2.  Ventricular tachycardia, history of syncopal episode: Was noted to be in ventricular tachycardia requiring lidocaine in  route with EMS.  Thereafter patient had had several episodes of nonsustained ventricular tachycardia(VT).  Dr. Earnie Larsson ofcardiology was consulted on 4/19, and recommended discontinuation of clonidine and started patient on metoprolol.  Echocardiogram revealed EF was noted to be 60 to 65%(full impression below).  Question possibility of ischemia for which nuclear stress test was performed 4/20, but showed no reversible ischemia. Patient had subsequent episode of nonsustained VT, but episodes appear to be less frequent.  Cardiology recommending monitoring for another 24-48 hours.  EP consulted on 4/21 daughter made note of previous history of syncope on 12/2018 which had initially been thought to be related to alcohol and methadone.   Patient still having -Continue to monitor electrolytes replace as needed -Increase metoprolol to 50 mg twice daily -Give 30 mEq of potassium chloride p.o. -Magnesium 40 mg p.o. daily -Recommend patient should not drive -Appreciate cardiology consultative services, will follow-up EP consult for further recommendations  3.  Essential hypertension: Blood pressures currently 115/75-150 5/93.  Diastolic still intermittently elevated.  Patient had been on clonidine at home.  However, clonidine discontinued and patient started on metoprolol 25 mg twice daily as noted above. -Trial of increased metoprolol 50 mg twice daily -Hydralazine IV as needed for elevated pressures  4.  Acute kidney injury superimposed on chronic kidney disease stage II: Resolved.  On admission creatinine elevated at 1.68, but baseline creatinine noted to be around 1.20.  Patient was given IV fluids and blood counts monitored.  Suspect symptoms likely secondary to blood loss.  Creatinine currently back at baseline of 1.2. -Continue to monitor  5.  Prostate cancer s/p prostatectomy  6.  Tobacco abuse: Patient reports that he does not smoke  much, and declined nicotine patch when offered. -Continue to  counsel on the need of cessation of tobacco  7.  Incidental finding on echocardiogram of mobile filamentous density in the RA that likely respresents a Chiari network. -Per cardiology for further work-up  8.  Thrombocytopenia: Relatively stable.  Platelet count 126  Body mass index is 22.17 kg/m.   Family Communication/Anticipated D/C date and plan/Code Status   DVT prophylaxis:SCDs Code Status: Full Code.  Family Communication: No family present at bedside Disposition Plan:   Medical Consultants:    Cardiology Dr. Virgina Jock  Gastroenterology- Dr. Benson Norway   Anti-Infectives:    None  Subjective:   Reports that he had had a regular bowel movement without blood present.  Denies having any pain or feeling any palpitations.  He reports that he was still currently driving, and had never been told not to drive following his syncopal event in January.  Objective:    Vitals:   03/22/19 2136 03/23/19 0425 03/23/19 0919 03/23/19 0923  BP: 127/85 (!) 144/86 138/85 138/85  Pulse: 73 67 74 74  Resp: 19     Temp: 98.4 F (36.9 C) 98.1 F (36.7 C)    TempSrc: Oral Oral    SpO2: 100% 100%    Weight:      Height:        Intake/Output Summary (Last 24 hours) at 03/23/2019 1405 Last data filed at 03/23/2019 0855 Gross per 24 hour  Intake 340 ml  Output 650 ml  Net -310 ml   Filed Weights   03/20/19 2157 03/21/19 0138 03/22/19 0222  Weight: 73.5 kg 67.8 kg 68.1 kg    Exam: Constitutional: Elderly male in NAD, calm, comfortable Eyes: PERRL, lids and conjunctivae normal ENMT: Mucous membranes are dry. Posterior pharynx clear of any exudate or lesions. Poor dentition.  Hard of hearing. Neck: normal, supple, no masses, no thyromegaly Respiratory: clear to auscultation bilaterally, no wheezing, no crackles. Normal respiratory effort. No accessory muscle use.  Cardiovascular: Regular with extra beat noted.  No murmurs / rubs / gallops. No extremity edema. 2+ pedal pulses. No  carotid bruits.  Abdomen: no tenderness, no masses palpated. No hepatosplenomegaly. Bowel sounds positive.  Musculoskeletal: no clubbing / cyanosis. No joint deformity upper and lower extremities. Good ROM, no contractures. Normal muscle tone.  Skin: no rashes, lesions, ulcers. No induration Neurologic: CN 2-12 grossly intact. Sensation intact, DTR normal. Strength 5/5 in all 4.  Psychiatric: Normal judgment and insight. Alert and oriented x 3. Normal mood.    Data Reviewed:   I have personally reviewed following labs and imaging studies:  Labs: Labs show the following:   Basic Metabolic Panel: Recent Labs  Lab 03/20/19 2206 03/21/19 0518 03/21/19 1514 03/22/19 0338  NA 141 140  --  140  K 3.8 4.0  --  3.7  CL 110 111  --  111  CO2 20* 20*  --  20*  GLUCOSE 132* 98  --  90  BUN 15 15  --  12  CREATININE 1.68* 1.23  --  1.20  CALCIUM 9.1 8.3*  --  8.7*  MG 1.8  --  1.7  --    GFR Estimated Creatinine Clearance: 45.7 mL/min (by C-G formula based on SCr of 1.2 mg/dL). Liver Function Tests: Recent Labs  Lab 03/20/19 2206  AST 18  ALT 14  ALKPHOS 89  BILITOT 0.7  PROT 6.8  ALBUMIN 3.6   No results for input(s): LIPASE, AMYLASE in the last 168 hours.  No results for input(s): AMMONIA in the last 168 hours. Coagulation profile Recent Labs  Lab 03/20/19 2206  INR 1.1    CBC: Recent Labs  Lab 03/20/19 2206 03/21/19 0518 03/21/19 1142 03/21/19 2142 03/22/19 0338 03/23/19 0414  WBC 8.4 6.8  --   --  5.7 6.6  HGB 11.9* 9.8* 8.8* 8.8* 8.9* 9.0*  HCT 37.9* 30.7* 27.7* 28.2* 28.0* 27.4*  MCV 87.3 86.2  --   --  85.6 85.1  PLT 163 135*  --   --  123* 126*   Cardiac Enzymes: Recent Labs  Lab 03/20/19 2206 03/21/19 1514 03/21/19 2104 03/22/19 0338  TROPONINI <0.03 <0.03 <0.03 <0.03   BNP (last 3 results) No results for input(s): PROBNP in the last 8760 hours. CBG: No results for input(s): GLUCAP in the last 168 hours. D-Dimer: No results for input(s):  DDIMER in the last 72 hours. Hgb A1c: No results for input(s): HGBA1C in the last 72 hours. Lipid Profile: No results for input(s): CHOL, HDL, LDLCALC, TRIG, CHOLHDL, LDLDIRECT in the last 72 hours. Thyroid function studies: No results for input(s): TSH, T4TOTAL, T3FREE, THYROIDAB in the last 72 hours.  Invalid input(s): FREET3 Anemia work up: No results for input(s): VITAMINB12, FOLATE, FERRITIN, TIBC, IRON, RETICCTPCT in the last 72 hours. Sepsis Labs: Recent Labs  Lab 03/20/19 2206 03/21/19 0518 03/22/19 0338 03/23/19 0414  WBC 8.4 6.8 5.7 6.6    Microbiology No results found for this or any previous visit (from the past 240 hour(s)).  Procedures and diagnostic studies:  Echocardiogram 03/21/2019: Impression  1. The left ventricle has normal systolic function with an ejection fraction of 60-65%. The cavity size was normal. There is moderate concentric left ventricular hypertrophy. Left ventricular diastolic function could not be evaluated due to  nondiagnostic images.  2. The right ventricle has normal systolic function. The cavity was normal. There is no increase in right ventricular wall thickness.  3. Right atrial size was moderately dilated.  4. The aortic valve is tricuspid. Aortic valve regurgitation was not assessed by color flow Doppler.  5. There is a mobile filamentous density in the RA that likely respresents a Chiari network.   Nm Myocar Multi W/spect W/wall Motion / Ef  Result Date: 03/22/2019 CLINICAL DATA:  Ventricular tachycardia. History of hypertension and chronic kidney disease. EXAM: MYOCARDIAL IMAGING WITH SPECT (REST AND PHARMACOLOGIC-STRESS) GATED LEFT VENTRICULAR WALL MOTION STUDY LEFT VENTRICULAR EJECTION FRACTION TECHNIQUE: Standard myocardial SPECT imaging was performed after resting intravenous injection of 10 mCi Tc-26m tetrofosmin. Subsequently, intravenous infusion of Lexiscan was performed under the supervision of the Cardiology staff. At peak  effect of the drug, 30 mCi Tc-40m tetrofosmin was injected intravenously and standard myocardial SPECT imaging was performed. Quantitative gated imaging was also performed to evaluate left ventricular wall motion, and estimate left ventricular ejection fraction. COMPARISON:  None. FINDINGS: Perfusion: No decreased activity in the left ventricle on stress imaging to suggest reversible ischemia or infarction. Wall Motion: Suggestion of minimal septal and inferior hypokinesis. Left Ventricular Ejection Fraction: 40 % End diastolic volume 98 ml End systolic volume 58 ml IMPRESSION: 1. No reversible ischemia or infarction. 2. Minimal septal and inferior hypokinesis. 3. Left ventricular ejection fraction 40% 4. Non invasive risk stratification*: Low *2012 Appropriate Use Criteria for Coronary Revascularization Focused Update: J Am Coll Cardiol. 2751;70(0):174-944. http://content.airportbarriers.com.aspx?articleid=[email or web address] Electronically Signed   By: Aletta Edouard M.D.   On: 03/22/2019 11:56   Ct Angio Abd/pel W/ And/or W/o  Result Date: 03/22/2019 CLINICAL DATA:  Hematochezia and history of lower GI bleed secondary to diverticulosis. EXAM: CT ANGIOGRAPHY ABDOMEN AND PELVIS WITH CONTRAST TECHNIQUE: Multidetector CT imaging of the abdomen and pelvis was performed using the standard protocol during bolus administration of intravenous contrast. Multiplanar reconstructed images and MIPs were obtained and reviewed to evaluate the vascular anatomy. CONTRAST:  181mL OMNIPAQUE IOHEXOL 350 MG/ML SOLN COMPARISON:  None. FINDINGS: VASCULAR Aorta: The aorta shows mild atherosclerosis without evidence of aneurysm, stenosis or dissection. Celiac: Normally patent. Normal distal branch vessel anatomy and patency. No gastric or duodenal bleeding source identified. SMA: Normally patent. Distal branches demonstrate normal patency. On arterial and venous phases of imaging, no active bleeding source is identified from the small bowel  or colon. Renals: Both kidneys are supplied by dominant renal arteries and small accessory lower pole arteries. No evidence of significant renal artery stenosis. IMA: Normally patent. Inflow: Bilateral iliac arteries demonstrate normal patency. No stenosis, aneurysmal disease or significant atherosclerosis. Proximal Outflow: Bilateral common femoral arteries and femoral bifurcations are widely patent. Veins: Venous phase imaging demonstrates normal patency of visualized mesenteric veins, splenic vein and portal vein. The IVC, bilateral renal veins, iliac veins and common femoral veins demonstrate normal patency. During the venous phase, no identifiable contrast extravasation is identified into the gastrointestinal tract. Review of the MIP images confirms the above findings. NON-VASCULAR Lower chest: No acute abnormality. Hepatobiliary: No focal liver abnormality is seen. No gallstones, gallbladder wall thickening, or biliary dilatation. Pancreas: Unremarkable. No pancreatic ductal dilatation or surrounding inflammatory changes. Spleen: Normal in size without focal abnormality. Adrenals/Urinary Tract: Adrenal glands are unremarkable. Kidneys are normal, without renal calculi, focal lesion, or hydronephrosis. Bladder is unremarkable. Stomach/Bowel: No bleeding source is identified within the gastrointestinal tract by CTA. The bowel shows no evidence of obstruction, ileus or inflammation. There are mild scattered diverticula throughout much of the colon without evidence diverticulitis. The appendix is unremarkable. No free intraperitoneal air is identified. Lymphatic: No enlarged lymph nodes identified. Reproductive: Prostate is unremarkable. Other: Evidence of prior left inguinal hernia repair. No significant hernias are identified. No ascites or focal abscess. Musculoskeletal: Degenerative disc disease at L5-S1. No focal bone lesion or fracture identified. IMPRESSION: 1. No bleeding source identified in the  gastrointestinal tract by CTA. 2. Mild diverticulosis of the colon without evidence of acute diverticulitis. Electronically Signed   By: Aletta Edouard M.D.   On: 03/22/2019 09:55    Medications:   . allopurinol  150 mg Oral Daily  . folic acid  1 mg Oral Daily  . magnesium oxide  400 mg Oral Daily  . metoprolol tartrate  25 mg Oral BID  . pantoprazole  40 mg Oral Daily  . potassium chloride  30 mEq Oral STAT  . sodium chloride flush  3 mL Intravenous Q12H  . thiamine  100 mg Oral Daily   Continuous Infusions:   LOS: 2 days    A   [facility]   *Please refer to [email or web address], password [email or web address] to get updated schedule on who will round on this patient, as hospitalists switch teams weekly. If 7PM-7AM, please contact night-coverage at [email or web address], password [email or web address] for any overnight needs.

## 2019-03-23 NOTE — Progress Notes (Signed)
Pt refusing bed alarm.

## 2019-03-23 NOTE — Consult Note (Signed)
Cardiology Consultation:   Patient ID: Robert Hamilton MRN: [id number]; DOB: March 20, 1934  Admit date: 03/20/2019 Date of Consult: 03/23/2019  Primary Care Provider: Maryella Shivers, MD Primary Cardiologist: Dr. Virgina Jock Primary Electrophysiologist:  None    Patient Profile:   Robert Hamilton is a 85 y.o. male with a hx of HTN, CKD, + smoker, prostate cancer treated surgically,  recurrent LGIB who is being seen today for the evaluation of recurrent NSVT at the request of Dr. Virgina Jock.  History of Present Illness:   [name] sought medical attention 2/2 LGIB with recurrent episodes of BRBPR.  In the ER he was found hypertensive (SPB 202) and tachycardia treated with IV Labetolol with improvement in both.  There was reports of EMS report noting a number of episodes of NSVT (20-30beats reported) and started lidocaine gtt in-route (appears to have been stopped in the ER), did get 2g Mag. .     GI on case, no PRBC so far He was admitted to medicine service for LGIB, cardiology consulted for NSVT episodes  W/u into NSVT episodes this admission have included: Trop I: <0.03 (x4) K+ 3.8 > 4.0 > 3.7 Mag 1.8 >  1.7 TTE noting LVEF 60-65%, mod conc LVH Nucler stress test with no infacrct or ischemia, + WMA w/minimal septal and IW hypokinesis, EF 40%, low risk study  His clonidine stopped and started on low dose metoprolol 25mg  BID with some initial improvement in reduction of frequency. Dr. Virgina Jock notes: I talked to patient's daughter Forde Radon and corroborated story of his "passing out" episode in 12/2018. Patient was reportedly found to have methadone and alcohol in his system, and his symptoms were attributed to that. patient or the daughter do not recollect any mention about VT  Given COVID19, in effort to reuduce patient/selft exposure I have called in via telephone and spoken with the patient for initial HPI and discussion.  The patient confirms his name and  DOB. He reports back in January he was drinking heavily and using drugs (he denies to tell me specifically what), and that his fainting spell in [place] was felt to be 2/2 to that.  He tells me he has no prior h/o syncope to this, and has since quit using drugs and has stopped drinking as well and has not had any further fainting.  He also denies any dizzy spell,s no near syncope.  No CP or SOB.  He denies any kind of cardiac awareness, unaware of his heart beat, "I have never had a heart problem until they told me here"     Past Medical History:  Diagnosis Date   Anemia    Cancer ([place])    Chronic kidney disease    Diverticulosis    GERD (gastroesophageal reflux disease)    Hypertension     Past Surgical History:  Procedure Laterality Date   HERNIA REPAIR     PROSTATE SURGERY     PROSTATECTOMY       Home Medications:  Prior to Admission medications   Medication Sig Start Date End Date Taking? Authorizing Provider  allopurinol (ZYLOPRIM) 300 MG tablet Take 150 mg by mouth daily.   Yes [provider]  cloNIDine (CATAPRES) 0.1 MG tablet Take 0.1 mg by mouth 2 (two) times a day. 01/18/19  Yes [provider]  folic acid (FOLVITE) 1 MG tablet Take 1 mg by mouth daily. 02/23/19  Yes [provider]  pantoprazole (PROTONIX) 40 MG tablet Take 1 tablet (40 mg total)  by mouth daily. 11/21/18  Yes Regalado, Belkys A, MD  thiamine (VITAMIN B-1) 100 MG tablet Take 100 mg by mouth daily.   Yes [provider]    Inpatient Medications: Scheduled Meds:  allopurinol  150 mg Oral Daily   folic acid  1 mg Oral Daily   metoprolol tartrate  25 mg Oral BID   pantoprazole  40 mg Oral Daily   sodium chloride flush  3 mL Intravenous Q12H   thiamine  100 mg Oral Daily   Continuous Infusions:  PRN Meds: hydrALAZINE  Allergies:   No Known Allergies  Social History:   Social History   Socioeconomic History   Marital status: Divorced    Spouse  name: Not on file   Number of children: Not on file   Years of education: Not on file   Highest education level: Not on file  Occupational History   Not on file  Social Needs   Financial resource strain: Not on file   Food insecurity:    Worry: Not on file    Inability: Not on file   Transportation needs:    Medical: Not on file    Non-medical: Not on file  Tobacco Use   Smoking status: Current Every Day Smoker    Types: Cigarettes   Smokeless tobacco: Never Used  Substance and Sexual Activity   Alcohol use: Yes   Drug use: No   Sexual activity: Yes  Lifestyle   Physical activity:    Days per week: Not on file    Minutes per session: Not on file   Stress: Not on file  Relationships   Social connections:    Talks on phone: Not on file    Gets together: Not on file    Attends religious service: Not on file    Active member of club or organization: Not on file    Attends meetings of clubs or organizations: Not on file    Relationship status: Not on file   Intimate partner violence:    Fear of current or ex partner: Not on file    Emotionally abused: Not on file    Physically abused: Not on file    Forced sexual activity: Not on file  Other Topics Concern   Not on file  Social History Narrative   Not on file    Family History:   He reports both his parents are dead, he thinks his Mom had heart problems.  Denies any known medical problems in his family otherwise  Denies FH of sudden death or arrhythmias  ROS:  Please see the history of present illness.  All other ROS reviewed and negative.     Physical Exam/Data:   Vitals:   03/22/19 1633 03/22/19 2042 03/22/19 2136 03/23/19 0425  BP: 119/78 133/80 127/85 (!) 144/86  Pulse: 66 88 73 67  Resp: 18  19   Temp: 98.5 F (36.9 C)  98.4 F (36.9 C) 98.1 F (36.7 C)  TempSrc: Oral  Oral Oral  SpO2: 100%  100% 100%  Weight:      Height:        Intake/Output Summary (Last 24 hours) at 03/23/2019  0915 Last data filed at 03/23/2019 0855 Gross per 24 hour  Intake 1009.01 ml  Output 650 ml  Net 359.01 ml   Last 3 Weights 03/22/2019 03/21/2019 03/20/2019  Weight (lbs) 150 lb 1.6 oz 149 lb 6.4 oz 162 lb  Weight (kg) 68.085 kg 67.767 kg 73.483 kg  Body mass index is 22.17 kg/m.  General:  Elderly , in no acute distress HEENT: normal Neck: no JVD Cardiac:  RR;  Lungs:  normal WOB  Abd: not distended Ext: no edema Musculoskeletal:  No obvious deformities Skin: warm and dry  Neuro:  No gross focal abnormalities noted Psych:  Normal affect   EKG:  The EKG was personally reviewed and demonstrates:   S/T 101bpm, PVCs SR, 1st degree AVBlock, 246ms PR Telemetry:  Telemetry was personally reviewed and demonstrates:   SR with initially frequent long runs of self terminated MMVT 40- 50 beats have diminished in frequency and duration 7-10beats, since last evening has had only single PVCs, fairly frequent  Relevant CV Studies:  Nm Myocar Multi W/spect W/wall Motion / Ef Result Date: 03/22/2019 CLINICAL DATA:  Ventricular tachycardia. History of hypertension and chronic kidney disease. EXAM: MYOCARDIAL IMAGING WITH SPECT (REST AND PHARMACOLOGIC-STRESS) GATED LEFT VENTRICULAR WALL MOTION STUDY LEFT VENTRICULAR EJECTION FRACTION TECHNIQUE: Standard myocardial SPECT imaging was performed after resting intravenous injection of 10 mCi Tc-40m tetrofosmin. Subsequently, intravenous infusion of Lexiscan was performed under the supervision of the Cardiology staff. At peak effect of the drug, 30 mCi Tc-37m tetrofosmin was injected intravenously and standard myocardial SPECT imaging was performed. Quantitative gated imaging was also performed to evaluate left ventricular wall motion, and estimate left ventricular ejection fraction. COMPARISON:  None. FINDINGS: Perfusion: No decreased activity in the left ventricle on stress imaging to suggest reversible ischemia or infarction. Wall Motion: Suggestion of  minimal septal and inferior hypokinesis. Left Ventricular Ejection Fraction: 40 % End diastolic volume 98 ml End systolic volume 58 ml IMPRESSION: 1. No reversible ischemia or infarction. 2. Minimal septal and inferior hypokinesis. 3. Left ventricular ejection fraction 40% 4. Non invasive risk stratification*: Low *2012 Appropriate Use Criteria for Coronary Revascularization Focused Update: J Am Coll Cardiol. 3976;73(4):193-790. http://content.airportbarriers.com.aspx?articleid=[email or web address] Electronically Signed   By: Aletta Edouard M.D.   On: 03/22/2019 11:56    03/21/2019: TTE IMPRESSIONS  1. The left ventricle has normal systolic function with an ejection fraction of 60-65%. The cavity size was normal. There is moderate concentric left ventricular hypertrophy. Left ventricular diastolic function could not be evaluated due to  nondiagnostic images.  2. The right ventricle has normal systolic function. The cavity was normal. There is no increase in right ventricular wall thickness.  3. Right atrial size was moderately dilated.  4. The aortic valve is tricuspid. Aortic valve regurgitation was not assessed by color flow Doppler.  5. There is a mobile filamentous density in the RA that likely respresents a Chiari network.  FINDINGS  Left Ventricle: The left ventricle has normal systolic function, with an ejection fraction of 60-65%. The cavity size was normal. There is moderate concentric left ventricular hypertrophy. Left ventricular diastolic function could not be evaluated due  to nondiagnostic images. Right Ventricle: The right ventricle has normal systolic function. The cavity was normal. There is no increase in right ventricular wall thickness. Left Atrium: Left atrial size was normal in size. Right Atrium: Right atrial size was moderately dilated. Right atrial pressure is estimated at 10 mmHg. There is a mobile filamentous density in the RA that likely respresents a Chiari network. Interatrial  Septum: No atrial level shunt detected by color flow Doppler. Pericardium: There is no evidence of pericardial effusion. Mitral Valve: The mitral valve is normal in structure. Mitral valve regurgitation is mild by color flow Doppler. Tricuspid Valve: The tricuspid valve is normal in structure. Tricuspid valve regurgitation is mild  by color flow Doppler. Aortic Valve: The aortic valve is tricuspid Aortic valve regurgitation was not assessed by color flow Doppler. Pulmonic Valve: The pulmonic valve was normal in structure. Pulmonic valve regurgitation is not visualized by color flow Doppler. Venous: The inferior vena cava is normal in size with greater than 50% respiratory variability.     Laboratory Data:  Chemistry Recent Labs  Lab 03/20/19 2206 03/21/19 0518 03/22/19 0338  NA 141 140 140  K 3.8 4.0 3.7  CL 110 111 111  CO2 20* 20* 20*  GLUCOSE 132* 98 90  BUN 15 15 12   CREATININE 1.68* 1.23 1.20  CALCIUM 9.1 8.3* 8.7*  GFRNONAA 37* 54* 56*  GFRAA 43* >60 >60  ANIONGAP 11 9 9     Recent Labs  Lab 03/20/19 2206  PROT 6.8  ALBUMIN 3.6  AST 18  ALT 14  ALKPHOS 89  BILITOT 0.7   Hematology Recent Labs  Lab 03/21/19 0518  03/21/19 2142 03/22/19 0338 03/23/19 0414  WBC 6.8  --   --  5.7 6.6  RBC 3.56*  --   --  3.27* 3.22*  HGB 9.8*   < > 8.8* 8.9* 9.0*  HCT 30.7*   < > 28.2* 28.0* 27.4*  MCV 86.2  --   --  85.6 85.1  MCH 27.5  --   --  27.2 28.0  MCHC 31.9  --   --  31.8 32.8  RDW 14.0  --   --  14.0 13.9  PLT 135*  --   --  123* 126*   < > = values in this interval not displayed.   Cardiac Enzymes Recent Labs  Lab 03/20/19 2206 03/21/19 1514 03/21/19 2104 03/22/19 0338  TROPONINI <0.03 <0.03 <0.03 <0.03   No results for input(s): TROPIPOC in the last 168 hours.  BNP Recent Labs  Lab 03/20/19 2206  BNP 281.5*    DDimer No results for input(s): DDIMER in the last 168 hours.  Radiology/Studies:    Ct Angio Abd/pel W/ And/or W/o Result Date:  03/22/2019 CLINICAL DATA:  Hematochezia and history of lower GI bleed secondary to diverticulosis. EXAM: CT ANGIOGRAPHY ABDOMEN AND PELVIS WITH CONTRAST TECHNIQUE: Multidetector CT imaging of the abdomen and pelvis was performed using the standard protocol during bolus administration of intravenous contrast. Multiplanar reconstructed images and MIPs were obtained and reviewed to evaluate the vascular anatomy. CONTRAST:  173mL OMNIPAQUE IOHEXOL 350 MG/ML SOLN COMPARISON:  None. FINDINGS: VASCULAR Aorta: The aorta shows mild atherosclerosis without evidence of aneurysm, stenosis or dissection. Celiac: Normally patent. Normal distal branch vessel anatomy and patency. No gastric or duodenal bleeding source identified. SMA: Normally patent. Distal branches demonstrate normal patency. On arterial and venous phases of imaging, no active bleeding source is identified from the small bowel or colon. Renals: Both kidneys are supplied by dominant renal arteries and small accessory lower pole arteries. No evidence of significant renal artery stenosis. IMA: Normally patent. Inflow: Bilateral iliac arteries demonstrate normal patency. No stenosis, aneurysmal disease or significant atherosclerosis. Proximal Outflow: Bilateral common femoral arteries and femoral bifurcations are widely patent. Veins: Venous phase imaging demonstrates normal patency of visualized mesenteric veins, splenic vein and portal vein. The IVC, bilateral renal veins, iliac veins and common femoral veins demonstrate normal patency. During the venous phase, no identifiable contrast extravasation is identified into the gastrointestinal tract. Review of the MIP images confirms the above findings. NON-VASCULAR Lower chest: No acute abnormality. Hepatobiliary: No focal liver abnormality is seen. No gallstones, gallbladder wall  thickening, or biliary dilatation. Pancreas: Unremarkable. No pancreatic ductal dilatation or surrounding inflammatory changes. Spleen:  Normal in size without focal abnormality. Adrenals/Urinary Tract: Adrenal glands are unremarkable. Kidneys are normal, without renal calculi, focal lesion, or hydronephrosis. Bladder is unremarkable. Stomach/Bowel: No bleeding source is identified within the gastrointestinal tract by CTA. The bowel shows no evidence of obstruction, ileus or inflammation. There are mild scattered diverticula throughout much of the colon without evidence diverticulitis. The appendix is unremarkable. No free intraperitoneal air is identified. Lymphatic: No enlarged lymph nodes identified. Reproductive: Prostate is unremarkable. Other: Evidence of prior left inguinal hernia repair. No significant hernias are identified. No ascites or focal abscess. Musculoskeletal: Degenerative disc disease at L5-S1. No focal bone lesion or fracture identified. IMPRESSION: 1. No bleeding source identified in the gastrointestinal tract by CTA. 2. Mild diverticulosis of the colon without evidence of acute diverticulitis. Electronically Signed   By: Aletta Edouard M.D.   On: 03/22/2019 09:55    Assessment and Plan:   1. VT, monomorphic, self terminated, and asymptomatic      normal LVEF by TTE, Dr. Imagene Riches mention in his note "wall motion is completely normal"     No infarct or  ischemia, low risk stress myoview noting EF 40% w/WMA (both noted above)     Addition of metoprolol has decreased both duration, and frequency though has not eliminated it, c/w frequent PVCs (multifocal), totrate to HR/BP tolerance Keep K+ >4.0 and Mag >2.0  He has h/o one syncopal event in jan 2020 attributed to drugs/ETOH, none since and he reports quitting both   Dr. Rayann Heman will see and examined the patient later today   For questions or updates, please contact [facility] Please consult [email or web address] for contact info under     Signed, Baldwin Jamaica, PA-C  03/23/2019 9:15 AM   I have seen, examined the patient, and reviewed the above  assessment and plan.  Changes to above are made where necessary.  On exam, RRR.  Pt with asymptomatic, nonsustained VT in the setting of preserved ejection fraction and no ischemia on myoview.  No significant family history and ekg is low risk. I would advise metoprolol as you have done.  I would not advise AAD therapy or ablation at this time.  No indication for ICD or further EP workup.  Electrophysiology team to see as needed while here. Please call with questions.   Co Sign: Thompson Grayer, MD 03/23/2019 7:49 PM

## 2019-03-24 LAB — BASIC METABOLIC PANEL
Anion gap: 9 (ref 5–15)
BUN: 14 mg/dL (ref 8–23)
CO2: 22 mmol/L (ref 22–32)
Calcium: 9.1 mg/dL (ref 8.9–10.3)
Chloride: 111 mmol/L (ref 98–111)
Creatinine, Ser: 1.37 mg/dL — ABNORMAL HIGH (ref 0.61–1.24)
GFR calc Af Amer: 55 mL/min — ABNORMAL LOW (ref >60)
GFR calc non Af Amer: 48 mL/min — ABNORMAL LOW (ref >60)
Glucose, Bld: 108 mg/dL — ABNORMAL HIGH (ref 70–99)
Potassium: 4.1 mmol/L (ref 3.5–5.1)
Sodium: 142 mmol/L (ref 135–145)

## 2019-03-24 LAB — MAGNESIUM: Magnesium: 1.9 mg/dL (ref 1.7–2.4)

## 2019-03-24 MED ORDER — METOPROLOL TARTRATE 50 MG PO TABS: 50.0000 mg | ORAL_TABLET | Freq: Two times a day (BID) | ORAL | 0 refills | Status: DC

## 2019-03-24 MED ORDER — MAGNESIUM OXIDE 400 (241.3 MG) MG PO TABS: 400.0000 mg | ORAL_TABLET | Freq: Every day | ORAL | 0 refills | Status: DC

## 2019-03-24 NOTE — Progress Notes (Signed)
Subjective:  Feels well this morning No chest pain.  Objective:  Vital Signs in the last 24 hours: Temp:  [98 F (36.7 C)-98.7 F (37.1 C)] 98.7 F (37.1 C) (04/22 1135) Pulse Rate:  [53-85] 53 (04/22 1135) Resp:  [19-24] 19 (04/22 1135) BP: (131-153)/(84-88) 131/85 (04/22 1135) SpO2:  [98 %-100 %] 100 % (04/22 1135) Weight:  [66 kg] 66 kg (04/22 0429)  Intake/Output from previous day: 04/21 0701 - 04/22 0700 In: 723 [P.O.:720; I.V.:3] Out: 650 [Urine:650]  Physical Exam  Constitutional: He is oriented to person, place, and time. He appears well-developed and well-nourished. No distress.  HENT:  Head: Normocephalic and atraumatic.  Eyes: Pupils are equal, round, and reactive to light. Conjunctivae are normal.  Neck: No JVD present.  Cardiovascular: Normal rate, regular rhythm and intact distal pulses.  Pulmonary/Chest: Effort normal and breath sounds normal. He has no wheezes. He has no rales.  Abdominal: Soft. Bowel sounds are normal. There is no rebound.  Musculoskeletal:        General: No edema.  Lymphadenopathy:    He has no cervical adenopathy.  Neurological: He is alert and oriented to person, place, and time. No cranial nerve deficit.  Skin: Skin is warm and dry.  Psychiatric: He has a normal mood and affect.  Nursing note and vitals reviewed.    Lab Results: BMP Recent Labs    03/21/19 0518 03/22/19 0338 03/24/19 1008  NA 140 140 142  K 4.0 3.7 4.1  CL 111 111 111  CO2 20* 20* 22  GLUCOSE 98 90 108*  BUN 15 12 14   CREATININE 1.23 1.20 1.37*  CALCIUM 8.3* 8.7* 9.1  GFRNONAA 54* 56* 48*  GFRAA >60 >60 55*    CBC Recent Labs  Lab 03/23/19 0414  WBC 6.6  RBC 3.22*  HGB 9.0*  HCT 27.4*  PLT 126*  MCV 85.1  MCH 28.0  MCHC 32.8  RDW 13.9    HEMOGLOBIN A1C No results found for: HGBA1C, MPG  Cardiac Panel (last 3 results) Recent Labs    03/21/19 1514 03/21/19 2104 03/22/19 0338  TROPONINI <0.03 <0.03 <0.03    BNP (last 3  results) Recent Labs    03/20/19 2206  BNP 281.5*    TSH No results for input(s): TSH in the last 8760 hours.  Lipid Panel  No results found for: CHOL, TRIG, HDL, CHOLHDL, VLDL, LDLCALC, LDLDIRECT   Hepatic Function Panel Recent Labs    03/20/19 2206  PROT 6.8  ALBUMIN 3.6  AST 18  ALT 14  ALKPHOS 89  BILITOT 0.7   CARDIAC STUDIES:  EKG 03/21/2019: Sinus rhythm, first degree AV block, frequent PVC's  Echocardiogram 03/21/2019: Echocardiogram 03/21/2019 (My independent read): Normal size LV. Mod LVH. EF 60-65%. Inadequate Doppler date for diastolic assessment. Normal RV. Mild to mod MR.  Mod RA dilatation. Prominent Chiary network seen. Normal RAP.   Assessment & Recommendations:  85 y.o. African-American male  with hypertension, CKD, tobacco abuse, h/o prostatectomy, h/o diverticulosis, admitted with lower GI bleeding. Cardiology consulted for ventricular tachyardia.  Nonsustained ventricular tachycardia: Predominantly monomorphic, asymptomatic VT.   Structurally normal heart on echocardiogram. EF 60-65%.  No ischemia on stress test. Low EF and WMA on stress test possible artifactual. Evaluated by EP. No AAD/ablation/ICD recommended at this time.  Continue metoprolol 25 mg bid. Will follow up outpatient.  Rectal bleed:  Management as per primary team.  Signing off. Please call back, if any questions arise.   [name], M.D.  03/24/2019, 12:28 PM [facility] Pager: [phone] Office: [phone] If no answer: [phone]

## 2019-03-24 NOTE — Progress Notes (Signed)
Discharge instructions given to patient he verbalized understanding of instructions given. CCMD informed of discharge patient to alert his daughter of discharge for ride home.

## 2019-03-24 NOTE — Care Management Important Message (Signed)
Important Message  Patient Details  Name: Robert Hamilton MRN: [id number] Date of Birth: 1934/06/29   Medicare Important Message Given:  Yes    Orbie Pyo 03/24/2019, 2:46 PM

## 2019-03-24 NOTE — Consult Note (Signed)
   [facility] Inpatient Consult   03/24/2019  Robert Hamilton 17-Jul-1934 [id number]  We have reviewed your referral request and assigned this patient for outreach to General EMMI calls  Natividad Brood, RN BSN [facility] Liaison  [phone] business mobile phone Toll free office [phone]

## 2019-03-24 NOTE — Discharge Summary (Signed)
Physician Discharge Summary  Robert Hamilton GYI:[id number] DOB: 1934-01-04 DOA: 03/20/2019  PCP: Maryella Shivers, MD  Admit date: 03/20/2019 Discharge date: 03/24/2019  Admitted From: home Discharge disposition: home   Recommendations for Outpatient Follow-Up:   Follow K and Mg-- goal of 4 and 2 Cbc 1 week  Discharge Diagnosis:   Principal Problem:   Lower GI bleeding Active Problems:   Cigarette smoker   Acute kidney injury superimposed on chronic kidney disease ([place])   History of prostate cancer   Ventricular tachycardia, non-sustained (HCC)   Diverticulosis of colon with hemorrhage   Acute blood loss anemia   Thrombocytopenia ([place])    Discharge Condition: Improved.  Diet recommendation: Low sodium, heart healthy Wound care: None.  Code status: Full.   History of Present Illness:   Robert Hamilton is an 85 y.o. male with past medical history significant for HTN, CKD, tobacco abuse, GI bleeding, diverticulosis, prostate cancer s/p prostatectomy; who presents with complaints of bloody stools.   Hospital Course by Problem:   1. Lower gastrointestinal bleeding likely diverticular, acute blood loss anemia:   History of GI bleeding from diverticulosis previously in the past.  Last admitted in 11/2018 for the same and during that hospitalization he had been evaluated by gastroenterology, but no colonoscopy had been performed.  On admission patient noted had 2 episodes of bright red blood per rectum.  Hemoglobin on admission noted to be 11.9, but dropped down to as low as 8.8 g/dL.  Gastroenterology was consulted, but recommended obtaining a CT angiogram a patient continued to have bleeding.  At this time blood counts have remained stable and patient has not required blood transfusion.  He had continued to report rectal bleeding and on 4/19 CT angiogram of the abdomen/pelvis was obtained but did not show any acute area of bleeding.  Patient stools have remained  nonbloody. -Likely to need outpatient follow-up with gastroenterology -[facility] consultative services during hospitalization  2.  Ventricular tachycardia, history of syncopal episode: Was noted to be in ventricular tachycardia requiring lidocaine in route with EMS.  Thereafter patient had had several episodes of nonsustained ventricular tachycardia(VT).  Dr. Earnie Larsson ofcardiology was consulted on 4/19, and recommended discontinuation of clonidine and started patient on metoprolol.  Echocardiogram revealed EF was noted to be 60 to 65%(full impression below).  Question possibility of ischemia for which nuclear stress test was performed 4/20, but showed no reversible ischemia. Patient had subsequent episode of nonsustained VT, but episodes appear to be less frequent.  - EP consulted on 4/21 daughter made note of previous history of syncope on 12/2018 which had initially been thought to be related to alcohol and methadone.  -per EP: I would advise metoprolol as you have done.  I would not advise AAD therapy or ablation at this time.  No indication for ICD or further EP workup.  3.  Essential hypertension: Blood pressures currently 115/75-150 5/93.  Diastolic still intermittently elevated.  Patient had been on clonidine at home.  However, clonidine discontinued and patient started on metoprolol   4.  Acute kidney injury superimposed on chronic kidney disease stage II: Resolved.  On admission creatinine elevated at 1.68, but baseline creatinine noted to be around 1.20.  Patient was given IV fluids and blood counts monitored.  Suspect symptoms likely secondary to blood loss.   5.  Prostate cancer s/p prostatectomy  6.  Tobacco abuse: Patient reports that he does not smoke much, and declined nicotine patch when offered. -  Continue to counsel on the need of cessation of tobacco  7.  Incidental finding on echocardiogram of mobile filamentous density in the RA that likely respresents a  Chiari network. -Per cardiology for further work-up  8.  Thrombocytopenia: Relatively stable.  Platelet count 126 -outpatient follow up    Medical Consultants:   GI cardiology   Discharge Exam:   Vitals:   03/23/19 2115 03/24/19 0424  BP: (!) 153/87 135/88  Pulse: 85 74  Resp:  (!) 24  Temp:  98 F (36.7 C)  SpO2:  98%   Vitals:   03/23/19 1932 03/23/19 2115 03/24/19 0424 03/24/19 0429  BP: 136/84 (!) 153/87 135/88   Pulse: 71 85 74   Resp: 20  (!) 24   Temp: 98.2 F (36.8 C)  98 F (36.7 C)   TempSrc: Oral  Oral   SpO2: 100%  98%   Weight:    66 kg  Height:        General exam: Appears calm and comfortable   The results of significant diagnostics from this hospitalization (including imaging, microbiology, ancillary and laboratory) are listed below for reference.     Procedures and Diagnostic Studies:   Ct Angio Abd/pel W/ And/or W/o  Result Date: 03/22/2019 CLINICAL DATA:  Hematochezia and history of lower GI bleed secondary to diverticulosis. EXAM: CT ANGIOGRAPHY ABDOMEN AND PELVIS WITH CONTRAST TECHNIQUE: Multidetector CT imaging of the abdomen and pelvis was performed using the standard protocol during bolus administration of intravenous contrast. Multiplanar reconstructed images and MIPs were obtained and reviewed to evaluate the vascular anatomy. CONTRAST:  17mL OMNIPAQUE IOHEXOL 350 MG/ML SOLN COMPARISON:  None. FINDINGS: VASCULAR Aorta: The aorta shows mild atherosclerosis without evidence of aneurysm, stenosis or dissection. Celiac: Normally patent. Normal distal branch vessel anatomy and patency. No gastric or duodenal bleeding source identified. SMA: Normally patent. Distal branches demonstrate normal patency. On arterial and venous phases of imaging, no active bleeding source is identified from the small bowel or colon. Renals: Both kidneys are supplied by dominant renal arteries and small accessory lower pole arteries. No evidence of significant renal  artery stenosis. IMA: Normally patent. Inflow: Bilateral iliac arteries demonstrate normal patency. No stenosis, aneurysmal disease or significant atherosclerosis. Proximal Outflow: Bilateral common femoral arteries and femoral bifurcations are widely patent. Veins: Venous phase imaging demonstrates normal patency of visualized mesenteric veins, splenic vein and portal vein. The IVC, bilateral renal veins, iliac veins and common femoral veins demonstrate normal patency. During the venous phase, no identifiable contrast extravasation is identified into the gastrointestinal tract. Review of the MIP images confirms the above findings. NON-VASCULAR Lower chest: No acute abnormality. Hepatobiliary: No focal liver abnormality is seen. No gallstones, gallbladder wall thickening, or biliary dilatation. Pancreas: Unremarkable. No pancreatic ductal dilatation or surrounding inflammatory changes. Spleen: Normal in size without focal abnormality. Adrenals/Urinary Tract: Adrenal glands are unremarkable. Kidneys are normal, without renal calculi, focal lesion, or hydronephrosis. Bladder is unremarkable. Stomach/Bowel: No bleeding source is identified within the gastrointestinal tract by CTA. The bowel shows no evidence of obstruction, ileus or inflammation. There are mild scattered diverticula throughout much of the colon without evidence diverticulitis. The appendix is unremarkable. No free intraperitoneal air is identified. Lymphatic: No enlarged lymph nodes identified. Reproductive: Prostate is unremarkable. Other: Evidence of prior left inguinal hernia repair. No significant hernias are identified. No ascites or focal abscess. Musculoskeletal: Degenerative disc disease at L5-S1. No focal bone lesion or fracture identified. IMPRESSION: 1. No bleeding source identified in the gastrointestinal tract  by CTA. 2. Mild diverticulosis of the colon without evidence of acute diverticulitis. Electronically Signed   By: Aletta Edouard  M.D.   On: 03/22/2019 09:55     Labs:   Basic Metabolic Panel: Recent Labs  Lab 03/20/19 2206 03/21/19 0518 03/21/19 1514 03/22/19 0338 03/24/19 1008  NA 141 140  --  140 142  K 3.8 4.0  --  3.7 4.1  CL 110 111  --  111 111  CO2 20* 20*  --  20* 22  GLUCOSE 132* 98  --  90 108*  BUN 15 15  --  12 14  CREATININE 1.68* 1.23  --  1.20 1.37*  CALCIUM 9.1 8.3*  --  8.7* 9.1  MG 1.8  --  1.7  --  1.9   GFR Estimated Creatinine Clearance: 38.8 mL/min (A) (by C-G formula based on SCr of 1.37 mg/dL (H)). Liver Function Tests: Recent Labs  Lab 03/20/19 2206  AST 18  ALT 14  ALKPHOS 89  BILITOT 0.7  PROT 6.8  ALBUMIN 3.6   No results for input(s): LIPASE, AMYLASE in the last 168 hours. No results for input(s): AMMONIA in the last 168 hours. Coagulation profile Recent Labs  Lab 03/20/19 2206  INR 1.1    CBC: Recent Labs  Lab 03/20/19 2206 03/21/19 0518 03/21/19 1142 03/21/19 2142 03/22/19 0338 03/23/19 0414  WBC 8.4 6.8  --   --  5.7 6.6  HGB 11.9* 9.8* 8.8* 8.8* 8.9* 9.0*  HCT 37.9* 30.7* 27.7* 28.2* 28.0* 27.4*  MCV 87.3 86.2  --   --  85.6 85.1  PLT 163 135*  --   --  123* 126*   Cardiac Enzymes: Recent Labs  Lab 03/20/19 2206 03/21/19 1514 03/21/19 2104 03/22/19 0338  TROPONINI <0.03 <0.03 <0.03 <0.03   BNP: Invalid input(s): POCBNP CBG: No results for input(s): GLUCAP in the last 168 hours. D-Dimer No results for input(s): DDIMER in the last 72 hours. Hgb A1c No results for input(s): HGBA1C in the last 72 hours. Lipid Profile No results for input(s): CHOL, HDL, LDLCALC, TRIG, CHOLHDL, LDLDIRECT in the last 72 hours. Thyroid function studies No results for input(s): TSH, T4TOTAL, T3FREE, THYROIDAB in the last 72 hours.  Invalid input(s): FREET3 Anemia work up No results for input(s): VITAMINB12, FOLATE, FERRITIN, TIBC, IRON, RETICCTPCT in the last 72 hours. Microbiology No results found for this or any previous visit (from the past 240  hour(s)).   Discharge Instructions:   Discharge Instructions    Diet - low sodium heart healthy   Complete by:  As directed    Increase activity slowly   Complete by:  As directed      Allergies as of 03/24/2019   No Known Allergies     Medication List    STOP taking these medications   cloNIDine 0.1 MG tablet Commonly known as:  CATAPRES     TAKE these medications   allopurinol 300 MG tablet Commonly known as:  ZYLOPRIM Take 150 mg by mouth daily.   folic acid 1 MG tablet Commonly known as:  FOLVITE Take 1 mg by mouth daily.   magnesium oxide 400 (241.3 Mg) MG tablet Commonly known as:  MAG-OX Take 1 tablet (400 mg total) by mouth daily. Start taking on:  March 25, 2019   metoprolol tartrate 50 MG tablet Commonly known as:  LOPRESSOR Take 1 tablet (50 mg total) by mouth 2 (two) times daily.   pantoprazole 40 MG tablet Commonly known as:  PROTONIX Take 1 tablet (40 mg total) by mouth daily.   thiamine 100 MG tablet Commonly known as:  VITAMIN B-1 Take 100 mg by mouth daily.      Follow-up Information    [name], MD Follow up on 04/08/2019.   Specialties:  Cardiology, Radiology Why:  1:30 pm Contact information: [place] [address] [phone]        Maryella Shivers, MD Follow up in 1 week(s).   Specialty:  Family Medicine Why:  BMP/Mg check Contact information: [address] [place] [address] [phone]            Time coordinating discharge: 25 min  Signed:  Geradine Girt DO  [facility] 03/24/2019, 11:16 AM

## 2019-03-26 ENCOUNTER — Other Ambulatory Visit: Payer: Self-pay

## 2019-03-26 NOTE — Patient Outreach (Signed)
[facility]) Care Management  03/26/2019  Robert Hamilton [birth year] [id number]  EMMI: general discharge red alert Referral date: 03/26/19 Referral reason: Know who to call about changes Insurance: Medicare  Day # 1  Telephone call to patient regarding EMMI general discharge red alert. HIPAA verified with patient.  RNCM introduced herself and explained reason for call. Patient states he knows to call his primary MD if he starts having problems. Patient states he has not talked with his primary MD office yet. RNCM advised patient to call his primary MD office today to scheduled follow up visit. Patient is scheduled to follow up with his cardiologist on 5/720.  Patient states he has transportation for his appointments.  He reports he is taking his medications as prescribed.  Patient denies having any new or ongoing symptoms since his discharge from the hospital.  RNCM advised patient to contact his doctor for any symptoms.  RNCM offered to give patient 24 hour nurse advise line phone number. Patient requested RNCM mail the information to him.  RNCM discussed COVID 19 symptoms and precautions. Patient advised to contact his doctor for mild symptoms. Advised to call 911 for more severe symptoms.  Patient verbalized understanding.   PLAN: 'RNCM will close patient due to patient being assessed and having no further needs.  RNCM will send patient [facility] care management brochure/ 24 hour nurse advise line information  Quinn Plowman RN,BSN,CCM [facility] Telephonic  [phone]

## 2019-03-31 DIAGNOSIS — I472 Ventricular tachycardia: Secondary | ICD-10-CM | POA: Diagnosis not present

## 2019-03-31 DIAGNOSIS — Z0000 Encounter for general adult medical examination without abnormal findings: Secondary | ICD-10-CM | POA: Diagnosis not present

## 2019-03-31 DIAGNOSIS — I10 Essential (primary) hypertension: Secondary | ICD-10-CM | POA: Diagnosis not present

## 2019-03-31 DIAGNOSIS — Z1331 Encounter for screening for depression: Secondary | ICD-10-CM | POA: Diagnosis not present

## 2019-03-31 DIAGNOSIS — Z139 Encounter for screening, unspecified: Secondary | ICD-10-CM | POA: Diagnosis not present

## 2019-03-31 DIAGNOSIS — Z7689 Persons encountering health services in other specified circumstances: Secondary | ICD-10-CM | POA: Diagnosis not present

## 2019-04-01 DIAGNOSIS — I10 Essential (primary) hypertension: Secondary | ICD-10-CM | POA: Diagnosis not present

## 2019-04-01 DIAGNOSIS — I1310 Hypertensive heart and chronic kidney disease without heart failure, with stage 1 through stage 4 chronic kidney disease, or unspecified chronic kidney disease: Secondary | ICD-10-CM | POA: Diagnosis not present

## 2019-04-01 DIAGNOSIS — I472 Ventricular tachycardia: Secondary | ICD-10-CM | POA: Diagnosis not present

## 2019-04-01 DIAGNOSIS — N183 Chronic kidney disease, stage 3 (moderate): Secondary | ICD-10-CM | POA: Diagnosis not present

## 2019-04-07 DIAGNOSIS — N183 Chronic kidney disease, stage 3 (moderate): Secondary | ICD-10-CM | POA: Diagnosis not present

## 2019-04-07 DIAGNOSIS — I472 Ventricular tachycardia: Secondary | ICD-10-CM | POA: Diagnosis not present

## 2019-04-07 DIAGNOSIS — I1310 Hypertensive heart and chronic kidney disease without heart failure, with stage 1 through stage 4 chronic kidney disease, or unspecified chronic kidney disease: Secondary | ICD-10-CM | POA: Diagnosis not present

## 2019-04-08 ENCOUNTER — Encounter: Payer: Self-pay | Admitting: Cardiology

## 2019-04-08 ENCOUNTER — Ambulatory Visit (INDEPENDENT_AMBULATORY_CARE_PROVIDER_SITE_OTHER): Payer: Medicare Other | Admitting: Cardiology

## 2019-04-08 ENCOUNTER — Ambulatory Visit: Payer: Self-pay | Admitting: Cardiology

## 2019-04-08 ENCOUNTER — Other Ambulatory Visit: Payer: Self-pay

## 2019-04-08 VITALS — BP 173/91 | HR 87 | Ht 69.0 in | Wt 145.0 lb

## 2019-04-08 DIAGNOSIS — I472 Ventricular tachycardia: Secondary | ICD-10-CM | POA: Diagnosis not present

## 2019-04-08 DIAGNOSIS — I10 Essential (primary) hypertension: Secondary | ICD-10-CM

## 2019-04-08 DIAGNOSIS — I4729 Other ventricular tachycardia: Secondary | ICD-10-CM

## 2019-04-08 MED ORDER — CARVEDILOL 6.25 MG PO TABS: 6.2500 mg | ORAL_TABLET | Freq: Two times a day (BID) | ORAL | 3 refills | Status: DC

## 2019-04-08 NOTE — Progress Notes (Signed)
Virtual Visit via Video Note   Subjective:   Robert Hamilton, male    DOB: 1934/06/08, 85 y.o.   MRN: [id number]   I connected with the patient on 04/08/19 by a video enabled telemedicine application and verified that I am speaking with the correct person using two identifiers.     I discussed the limitations of evaluation and management by telemedicine and the availability of in person appointments. The patient expressed understanding and agreed to proceed.   This visit type was conducted due to national recommendations for restrictions regarding the COVID-19 Pandemic (e.g. social distancing).  This format is felt to be most appropriate for this patient at this time.  All issues noted in this document were discussed and addressed.  No physical exam was performed (except for noted visual exam findings with Tele health visits).  The patient has consented to conduct a Tele health visit and understands insurance will be billed.     Chief complaint:  Ventricular tachycardia   HPI  85 year old African-American male with hypertension, CKD, tobacco abuse, history of prostatectomy, diverticular bleed in 03/2019, asymptomatic nonsustained ventricular tachycardia 03/2019.  Ventricular tachycardia, predominantly monomorphic, was noted during his hospitalization for GI bleed in April 2020.  Echocardiogram showed structurally normal heart with EF 60 to 65%.  Stress test showed no ischemia.  He was started on metoprolol 25 mg twice daily.  Electrophysiology was consulted.  Given asymptomatic nature of his NSVT, structurally normal heart, they recommended continued use of metoprolol, and recommended against antiarrhythmic therapy, ablation and ICD placement as there was no indication.    He is doing well since hospital discharge, and denies chest pain, shortness of breath, palpitations, leg edema, orthopnea, PND, TIA/syncope. He has not had any bleeding. He reports feeling sleepy since starting  metoprolol. He was seen by his PCP and amlodipine 2. Mg was added for hypertension.   Past Medical History:  Diagnosis Date  . Anemia   . Cancer ([place])   . Chronic kidney disease   . Diverticulosis   . GERD (gastroesophageal reflux disease)   . Hypertension      Past Surgical History:  Procedure Laterality Date  . HERNIA REPAIR    . PROSTATE SURGERY    . PROSTATECTOMY       Social History   Socioeconomic History  . Marital status: Divorced    Spouse name: Not on file  . Number of children: Not on file  . Years of education: Not on file  . Highest education level: Not on file  Occupational History  . Not on file  Social Needs  . Financial resource strain: Not on file  . Food insecurity:    Worry: Not on file    Inability: Not on file  . Transportation needs:    Medical: Not on file    Non-medical: Not on file  Tobacco Use  . Smoking status: Current Every Day Smoker    Types: Cigarettes  . Smokeless tobacco: Never Used  Substance and Sexual Activity  . Alcohol use: Yes  . Drug use: No  . Sexual activity: Yes  Lifestyle  . Physical activity:    Days per week: Not on file    Minutes per session: Not on file  . Stress: Not on file  Relationships  . Social connections:    Talks on phone: Not on file    Gets together: Not on file    Attends religious service: Not on file    Active member  of club or organization: Not on file    Attends meetings of clubs or organizations: Not on file    Relationship status: Not on file  . Intimate partner violence:    Fear of current or ex partner: Not on file    Emotionally abused: Not on file    Physically abused: Not on file    Forced sexual activity: Not on file  Other Topics Concern  . Not on file  Social History Narrative  . Not on file     History reviewed. No pertinent family history.   Current Outpatient Medications on File Prior to Visit  Medication Sig Dispense Refill  . allopurinol (ZYLOPRIM) 300 MG  tablet Take 150 mg by mouth daily.    Marland Kitchen amLODipine (NORVASC) 2.5 MG tablet Take 2.5 mg by mouth daily.    . folic acid (FOLVITE) 1 MG tablet Take 1 mg by mouth daily.    . magnesium oxide (MAG-OX) 400 (241.3 Mg) MG tablet Take 1 tablet (400 mg total) by mouth daily. 30 tablet 0  . metoprolol tartrate (LOPRESSOR) 50 MG tablet Take 1 tablet (50 mg total) by mouth 2 (two) times daily. 60 tablet 0  . pantoprazole (PROTONIX) 40 MG tablet Take 1 tablet (40 mg total) by mouth daily. 30 tablet 0  . thiamine (VITAMIN B-1) 100 MG tablet Take 100 mg by mouth daily.     No current facility-administered medications on file prior to visit.     Cardiovascular studies:  EKG 03/21/2019: Sinus rhythm, first degree AV block. Frequent PVC's  Lexiscan nuclear stress test 03/22/2019: 1. No reversible ischemia or infarction. 2. Minimal septal and inferior hypokinesis. 3. Left ventricular ejection fraction 40% 4. Non invasive risk stratification*: Low  Echocardiogram 03/21/2019:  1. The left ventricle has normal systolic function with an ejection fraction of 60-65%. The cavity size was normal. There is moderate concentric left ventricular hypertrophy. Left ventricular diastolic function could not be evaluated due to  nondiagnostic images.  2. The right ventricle has normal systolic function. The cavity was normal. There is no increase in right ventricular wall thickness.  3. Right atrial size was moderately dilated.  4. The aortic valve is tricuspid. Aortic valve regurgitation was not assessed by color flow Doppler.  5. There is a mobile filamentous density in the RA that likely respresents a Chiari network.  Recent labs: Results for KASPER, MUDRICK (MRN [id number]) as of 04/08/2019 09:08  Ref. Range 03/22/2019 03:38 03/24/2019 26:83  BASIC METABOLIC PANEL Unknown Rpt (A) Rpt (A)  Sodium Latest Ref Range: 135 - 145 mmol/L 140 142  Potassium Latest Ref Range: 3.5 - 5.1 mmol/L 3.7 4.1  Chloride Latest Ref  Range: 98 - 111 mmol/L 111 111  CO2 Latest Ref Range: 22 - 32 mmol/L 20 (L) 22  Glucose Latest Ref Range: 70 - 99 mg/dL 90 108 (H)  BUN Latest Ref Range: 8 - 23 mg/dL 12 14  Creatinine Latest Ref Range: 0.61 - 1.24 mg/dL 1.20 1.37 (H)  Calcium Latest Ref Range: 8.9 - 10.3 mg/dL 8.7 (L) 9.1  Anion gap Latest Ref Range: 5 - 15  9 9   Magnesium Latest Ref Range: 1.7 - 2.4 mg/dL  1.9  GFR, Est Non African American Latest Ref Range: >60 mL/min 56 (L) 48 (L)  GFR, Est African American Latest Ref Range: >60 mL/min >60 55 (L)   Results for [facility] BONNER, LARUE (MRN [id number]) as of 04/08/2019 09:08  Ref. Range 03/21/2019 21:42 03/22/2019 03:38 03/23/2019 04:14  WBC Latest Ref Range: 4.0 - 10.5 K/uL  5.7 6.6  RBC Latest Ref Range: 4.22 - 5.81 MIL/uL  3.27 (L) 3.22 (L)  Hemoglobin Latest Ref Range: 13.0 - 17.0 g/dL 8.8 (L) 8.9 (L) 9.0 (L)  HCT Latest Ref Range: 39.0 - 52.0 % 28.2 (L) 28.0 (L) 27.4 (L)  MCV Latest Ref Range: 80.0 - 100.0 fL  85.6 85.1  MCH Latest Ref Range: 26.0 - 34.0 pg  27.2 28.0  MCHC Latest Ref Range: 30.0 - 36.0 g/dL  31.8 32.8  RDW Latest Ref Range: 11.5 - 15.5 %  14.0 13.9  Platelets Latest Ref Range: 150 - 400 K/uL  123 (L) 126 (L)  nRBC Latest Ref Range: 0.0 - 0.2 %  0.0 0.0    Review of Systems  Constitution: Negative for decreased appetite, malaise/fatigue, weight gain and weight loss.       Feels sleepy   HENT: Negative for congestion.   Eyes: Negative for visual disturbance.  Cardiovascular: Negative for chest pain, dyspnea on exertion, leg swelling, palpitations and syncope.  Respiratory: Negative for shortness of breath.   Endocrine: Negative for cold intolerance.  Hematologic/Lymphatic: Does not bruise/bleed easily.  Skin: Negative for itching and rash.  Musculoskeletal: Negative for myalgias.  Gastrointestinal: Negative for abdominal pain, nausea and vomiting.  Genitourinary: Negative for dysuria.  Neurological: Negative for dizziness and weakness.   Psychiatric/Behavioral: The patient is not nervous/anxious.   All other systems reviewed and are negative.        Vitals:   04/07/19 1057  BP: (!) 173/91  Pulse: 87   (Measured by the patient using a home BP monitor)   Observation/findings during video visit   Objective:    Physical Exam  Constitutional: He is oriented to person, place, and time. He appears well-developed and well-nourished. No distress.  Pulmonary/Chest: Effort normal.  Neurological: He is alert and oriented to person, place, and time.  Psychiatric: He has a normal mood and affect.  Nursing note and vitals reviewed.         Assessment & Recommendations:   85 year old African-American male with hypertension, CKD, tobacco abuse, history of prostatectomy, diverticular bleed in 03/2019, asymptomatic nonsustained ventricular tachycardia 03/2019.  NSVT: Clinically asymptomatic. Recommend medical management with beta blockers.  He has had excessive sleepiness with metoprolol. Switch to carvedilol 6.25 mg bid  Hypertension: Suboptimal control. Continue amlodipine 2.5 Mg, carvedilol 6.25 mg bid.   Follow up telephone visit in 1 week, then in office f/u in 3 months.    [name], MD [facility] Pager: [phone] Office: [phone] If no answer Cell [phone]

## 2019-04-18 NOTE — Progress Notes (Signed)
   Virtual Visit via Video Note   Subjective:   Robert Hamilton, male    DOB: 05/14/1934, 85 y.o.   MRN: [id number]   I connected withthe patient on 05/18/2020by a telephone call and verified that I am speaking with the correct person using two identifiers.    I offered the patient a video enabled application for a virtual visit. Unfortunately, this could not be accomplished due to technical difficulties/lack of video enabled phone/computer. I discussed the limitations of evaluation and management by telemedicine and the availability of in person appointments. The patient expressed understanding and agreed to proceed.   This visit type was conducted due to national recommendations for restrictions regarding the COVID-19 Pandemic (e.g. social distancing). This format is felt to be most appropriate for this patient at this time. All issues noted in this document were discussed and addressed. No physical exam was performed (except for noted visual exam findings with Tele health visits). The patient has consented to conduct a Tele health visit and understands insurance will be billed.     Chief complaint:  Ventricular tachycardia   HPI  85 year old African-American male with hypertension, CKD, tobacco abuse, history of prostatectomy, diverticular bleed in 03/2019, asymptomatic nonsustained ventricular tachycardia 03/2019.  Ventricular tachycardia, predominantly monomorphic, was noted during his hospitalization for GI bleed in April 2020.  Echocardiogram showed structurally normal heart with EF 60 to 65%.  Stress test showed no ischemia.  He was started on metoprolol 25 mg twice daily.  Electrophysiology was consulted.  Given asymptomatic nature of his NSVT, structurally normal heart, they recommended continued use of metoprolol, and recommended against antiarrhythmic therapy, ablation and ICD placement as there was no indication.    At last virtual visit two weeks ago, I had switched him  from metoprolol to carvedilol due to his complaints of sleepiness. These symptoms have completely resolved. BP is better controlled, but still remains suboptimal. He does not have any other complaints today.   Cardiovascular studies:  EKG 03/21/2019: Sinus rhythm, first degree AV block. Frequent PVC's  Lexiscan nuclear stress test 03/22/2019: 1. No reversible ischemia or infarction. 2. Minimal septal and inferior hypokinesis. 3. Left ventricular ejection fraction 40% 4. Non invasive risk stratification*: Low  Echocardiogram 03/21/2019:  1. The left ventricle has normal systolic function with an ejection fraction of 60-65%. The cavity size was normal. There is moderate concentric left ventricular hypertrophy. Left ventricular diastolic function could not be evaluated due to  nondiagnostic images.  2. The right ventricle has normal systolic function. The cavity was normal. There is no increase in right ventricular wall thickness.  3. Right atrial size was moderately dilated.  4. The aortic valve is tricuspid. Aortic valve regurgitation was not assessed by color flow Doppler.  5. There is a mobile filamentous density in the RA that likely respresents a Chiari network.   Vitals:   04/19/19 1151  BP: (!) 149/101  Pulse: 73   (Measured by the patient using a home BP monitor)      Assessment & Recommendations:   85 year old African-American male with hypertension, CKD, tobacco abuse, history of prostatectomy, diverticular bleed in 03/2019, asymptomatic nonsustained ventricular tachycardia 03/2019.  NSVT: Clinically asymptomatic. Continue beta blockers.   Hypertension: Suboptimal control. Continue amlodipine 2.5 Mg, increase carvedilol to 12.5 mg bid.   F/u in 2 months.   [name], MD [facility] [place] Pager: [phone] Office: [phone] If no answer Cell [phone]

## 2019-04-19 ENCOUNTER — Encounter: Payer: Self-pay | Admitting: Cardiology

## 2019-04-19 ENCOUNTER — Other Ambulatory Visit: Payer: Self-pay

## 2019-04-19 ENCOUNTER — Ambulatory Visit (INDEPENDENT_AMBULATORY_CARE_PROVIDER_SITE_OTHER): Payer: Medicare Other | Admitting: Cardiology

## 2019-04-19 DIAGNOSIS — I10 Essential (primary) hypertension: Secondary | ICD-10-CM

## 2019-04-19 DIAGNOSIS — I472 Ventricular tachycardia: Secondary | ICD-10-CM | POA: Diagnosis not present

## 2019-04-19 DIAGNOSIS — I4729 Other ventricular tachycardia: Secondary | ICD-10-CM

## 2019-04-19 MED ORDER — CARVEDILOL 12.5 MG PO TABS: 12.5000 mg | ORAL_TABLET | Freq: Two times a day (BID) | ORAL | 3 refills | Status: DC

## 2019-04-30 DIAGNOSIS — I472 Ventricular tachycardia: Secondary | ICD-10-CM | POA: Diagnosis not present

## 2019-04-30 DIAGNOSIS — N183 Chronic kidney disease, stage 3 (moderate): Secondary | ICD-10-CM | POA: Diagnosis not present

## 2019-04-30 DIAGNOSIS — I10 Essential (primary) hypertension: Secondary | ICD-10-CM | POA: Diagnosis not present

## 2019-04-30 DIAGNOSIS — I1310 Hypertensive heart and chronic kidney disease without heart failure, with stage 1 through stage 4 chronic kidney disease, or unspecified chronic kidney disease: Secondary | ICD-10-CM | POA: Diagnosis not present

## 2019-06-01 DIAGNOSIS — I472 Ventricular tachycardia: Secondary | ICD-10-CM | POA: Diagnosis not present

## 2019-06-01 DIAGNOSIS — I10 Essential (primary) hypertension: Secondary | ICD-10-CM | POA: Diagnosis not present

## 2019-06-01 DIAGNOSIS — I1310 Hypertensive heart and chronic kidney disease without heart failure, with stage 1 through stage 4 chronic kidney disease, or unspecified chronic kidney disease: Secondary | ICD-10-CM | POA: Diagnosis not present

## 2019-06-01 DIAGNOSIS — N183 Chronic kidney disease, stage 3 (moderate): Secondary | ICD-10-CM | POA: Diagnosis not present

## 2019-06-25 ENCOUNTER — Other Ambulatory Visit: Payer: Self-pay

## 2019-06-25 ENCOUNTER — Ambulatory Visit (INDEPENDENT_AMBULATORY_CARE_PROVIDER_SITE_OTHER): Payer: Medicare Other | Admitting: Cardiology

## 2019-06-25 ENCOUNTER — Encounter: Payer: Self-pay | Admitting: Cardiology

## 2019-06-25 VITALS — BP 146/76 | HR 59 | Temp 97.5°F | Ht 69.0 in | Wt 153.9 lb

## 2019-06-25 DIAGNOSIS — I472 Ventricular tachycardia: Secondary | ICD-10-CM

## 2019-06-25 DIAGNOSIS — I4729 Other ventricular tachycardia: Secondary | ICD-10-CM

## 2019-06-25 DIAGNOSIS — R011 Cardiac murmur, unspecified: Secondary | ICD-10-CM

## 2019-06-25 DIAGNOSIS — I10 Essential (primary) hypertension: Secondary | ICD-10-CM

## 2019-06-25 DIAGNOSIS — R5383 Other fatigue: Secondary | ICD-10-CM

## 2019-06-25 MED ORDER — AMLODIPINE BESYLATE 2.5 MG PO TABS: 2.5000 mg | ORAL_TABLET | Freq: Every day | ORAL | 3 refills | Status: DC

## 2019-06-25 NOTE — Progress Notes (Signed)
Follow up visit  Subjective:   Robert Hamilton, male    DOB: 1934/02/17, 85 y.o.   MRN: [id number]   Chief Complaint  Patient presents with  . NSVT    2 month f/u  . Hypertension    HPI    85 year old African-American male with hypertension, CKD, tobacco abuse, history of prostatectomy, diverticular bleed in 03/2019, asymptomatic nonsustained ventricular tachycardia 03/2019.  Patient has not had any cardiac complaints. His only complaint today is feeling sleepy and fatigued around noon every day. BP is elevated today.   Past Medical History:  Diagnosis Date  . Anemia   . Cancer ([place])   . Chronic kidney disease   . Diverticulosis   . GERD (gastroesophageal reflux disease)   . Hypertension      Past Surgical History:  Procedure Laterality Date  . HERNIA REPAIR    . PROSTATE SURGERY    . PROSTATECTOMY       Social History   Socioeconomic History  . Marital status: Unknown    Spouse name: Not on file  . Number of children: 2  . Years of education: Not on file  . Highest education level: Not on file  Occupational History  . Not on file  Social Needs  . Financial resource strain: Not on file  . Food insecurity    Worry: Not on file    Inability: Not on file  . Transportation needs    Medical: Not on file    Non-medical: Not on file  Tobacco Use  . Smoking status: Current Every Day Smoker    Packs/day: 0.25    Types: Cigarettes  . Smokeless tobacco: Never Used  Substance and Sexual Activity  . Alcohol use: Not Currently    Comment: stopped 12/2018  . Drug use: No  . Sexual activity: Yes  Lifestyle  . Physical activity    Days per week: Not on file    Minutes per session: Not on file  . Stress: Not on file  Relationships  . Social Herbalist on phone: Not on file    Gets together: Not on file    Attends religious service: Not on file    Active member of club or organization: Not on file    Attends meetings of clubs or organizations:  Not on file    Relationship status: Not on file  . Intimate partner violence    Fear of current or ex partner: Not on file    Emotionally abused: Not on file    Physically abused: Not on file    Forced sexual activity: Not on file  Other Topics Concern  . Not on file  Social History Narrative  . Not on file      Current Outpatient Medications on File Prior to Visit  Medication Sig Dispense Refill  . allopurinol (ZYLOPRIM) 300 MG tablet Take 150 mg by mouth daily.    Marland Kitchen amLODipine (NORVASC) 2.5 MG tablet Take 2.5 mg by mouth daily.    . carvedilol (COREG) 12.5 MG tablet Take 1 tablet (12.5 mg total) by mouth 2 (two) times daily. 683 tablet 3  . folic acid (FOLVITE) 1 MG tablet Take 1 mg by mouth daily.    . magnesium oxide (MAG-OX) 400 (241.3 Mg) MG tablet Take 1 tablet (400 mg total) by mouth daily. (Patient not taking: Reported on 04/19/2019) 30 tablet 0  . pantoprazole (PROTONIX) 40 MG tablet Take 1 tablet (40 mg total) by mouth daily.  30 tablet 0  . thiamine (VITAMIN B-1) 100 MG tablet Take 100 mg by mouth daily.     No current facility-administered medications on file prior to visit.     Cardiovascular studies:  EKG 06/25/2019: Sinus rhythm 66 bpm. Frequent ectopic ventricular beats   Lexiscan nuclear stress test 03/22/2019: 1. No reversible ischemia or infarction. 2. Minimal septal and inferior hypokinesis. 3. Left ventricular ejection fraction 40% 4. Non invasive risk stratification*: Low  Echocardiogram 03/21/2019:  1. The left ventricle has normal systolic function with an ejection fraction of 60-65%. The cavity size was normal. There is moderate concentric left ventricular hypertrophy. Left ventricular diastolic function could not be evaluated due to  nondiagnostic images.  2. The right ventricle has normal systolic function. The cavity was normal. There is no increase in right ventricular wall thickness.  3. Right atrial size was moderately dilated.  4. The aortic  valve is tricuspid. Aortic valve regurgitation was not assessed by color flow Doppler.  5. There is a mobile filamentous density in the RA that likely respresents a Chiari network.    Recent labs: Results for Robert, Hamilton (MRN [id number]) as of 06/25/2019 07:40  Ref. Range 03/22/2019 03:38 03/24/2019 40:98  BASIC METABOLIC PANEL Unknown Rpt (A) Rpt (A)  Sodium Latest Ref Range: 135 - 145 mmol/L 140 142  Potassium Latest Ref Range: 3.5 - 5.1 mmol/L 3.7 4.1  Chloride Latest Ref Range: 98 - 111 mmol/L 111 111  CO2 Latest Ref Range: 22 - 32 mmol/L 20 (L) 22  Glucose Latest Ref Range: 70 - 99 mg/dL 90 108 (H)  BUN Latest Ref Range: 8 - 23 mg/dL 12 14  Creatinine Latest Ref Range: 0.61 - 1.24 mg/dL 1.20 1.37 (H)  Calcium Latest Ref Range: 8.9 - 10.3 mg/dL 8.7 (L) 9.1  Anion gap Latest Ref Range: 5 - 15  9 9   Magnesium Latest Ref Range: 1.7 - 2.4 mg/dL  1.9  GFR, Est Non African American Latest Ref Range: >60 mL/min 56 (L) 48 (L)  GFR, Est African American Latest Ref Range: >60 mL/min >60 55 (L)  Troponin I Latest Ref Range: <0.03 ng/mL <0.03    Results for Robert Hamilton (MRN [id number]) as of 06/25/2019 07:40  Ref. Range 03/23/2019 04:14  WBC Latest Ref Range: 4.0 - 10.5 K/uL 6.6  RBC Latest Ref Range: 4.22 - 5.81 MIL/uL 3.22 (L)  Hemoglobin Latest Ref Range: 13.0 - 17.0 g/dL 9.0 (L)  HCT Latest Ref Range: 39.0 - 52.0 % 27.4 (L)  MCV Latest Ref Range: 80.0 - 100.0 fL 85.1  MCH Latest Ref Range: 26.0 - 34.0 pg 28.0  MCHC Latest Ref Range: 30.0 - 36.0 g/dL 32.8  RDW Latest Ref Range: 11.5 - 15.5 % 13.9  Platelets Latest Ref Range: 150 - 400 K/uL 126 (L)  nRBC Latest Ref Range: 0.0 - 0.2 % 0.0    Review of Systems  Constitution: Negative for decreased appetite, malaise/fatigue, weight gain and weight loss.  HENT: Negative for congestion.   Eyes: Negative for visual disturbance.  Cardiovascular: Negative for chest pain, dyspnea on exertion, leg swelling, palpitations and syncope.   Respiratory: Negative for cough.   Endocrine: Negative for cold intolerance.  Hematologic/Lymphatic: Does not bruise/bleed easily.  Skin: Negative for itching and rash.  Musculoskeletal: Negative for myalgias.  Gastrointestinal: Negative for abdominal pain, nausea and vomiting.  Genitourinary: Negative for dysuria.  Neurological: Negative for dizziness and weakness.  Psychiatric/Behavioral: The patient is not nervous/anxious.   All other  systems reviewed and are negative.        Vitals:   06/25/19 1022 06/25/19 1023  BP: (!) 151/80 (!) 146/76  Pulse: (!) 36 (!) 59  Temp:    SpO2:      Body mass index is 22.73 kg/m. Filed Weights   06/25/19 0924  Weight: 153 lb 14.4 oz (69.8 kg)     Objective:   Physical Exam  Constitutional: He is oriented to person, place, and time. He appears well-developed and well-nourished. No distress.  HENT:  Head: Normocephalic and atraumatic.  Eyes: Pupils are equal, round, and reactive to light. Conjunctivae are normal.  Neck: No JVD present.  Cardiovascular: Regular rhythm. Frequent extrasystoles are present. Bradycardia present.  Murmur heard. High-pitched blowing holosystolic murmur is present at the apex. Pulmonary/Chest: Effort normal and breath sounds normal. He has no wheezes. He has no rales.  Abdominal: Soft. Bowel sounds are normal. There is no rebound.  Musculoskeletal:        General: No edema.  Lymphadenopathy:    He has no cervical adenopathy.  Neurological: He is alert and oriented to person, place, and time. No cranial nerve deficit.  Skin: Skin is warm and dry.  Psychiatric: He has a normal mood and affect.  Nursing note and vitals reviewed.       Assessment & Recommendations:   85 year old African-American male with hypertension, CKD, tobacco abuse, history of prostatectomy, diverticular bleed in 03/2019, asymptomatic nonsustained ventricular tachycardia 03/2019.  NSVT: Clinically asymptomatic. Continue beta  blockers.   Hypertension Suboptimal control. Increase amlodipine to 2.5 mg Conitnue carvedilol to 12.5 mg bid.  Fatigue may or may not be related to carvedilol. Will check TSH and sleep study. If both negative, could consider switching to different beta blocker.   Murmur: Holosystolic murmur concerning for MR. Will check echocardiogram.   F/u in 2 months.   [name], MD [facility]. [place] Pager: [phone] Office: [phone] If no answer Cell [phone]

## 2019-06-29 DIAGNOSIS — R5383 Other fatigue: Secondary | ICD-10-CM | POA: Diagnosis not present

## 2019-07-02 DIAGNOSIS — I1310 Hypertensive heart and chronic kidney disease without heart failure, with stage 1 through stage 4 chronic kidney disease, or unspecified chronic kidney disease: Secondary | ICD-10-CM | POA: Diagnosis not present

## 2019-07-02 DIAGNOSIS — N183 Chronic kidney disease, stage 3 (moderate): Secondary | ICD-10-CM | POA: Diagnosis not present

## 2019-07-02 DIAGNOSIS — I472 Ventricular tachycardia: Secondary | ICD-10-CM | POA: Diagnosis not present

## 2019-07-02 DIAGNOSIS — I10 Essential (primary) hypertension: Secondary | ICD-10-CM | POA: Diagnosis not present

## 2019-07-08 ENCOUNTER — Other Ambulatory Visit: Payer: Self-pay

## 2019-07-08 ENCOUNTER — Ambulatory Visit (INDEPENDENT_AMBULATORY_CARE_PROVIDER_SITE_OTHER): Payer: Medicare Other

## 2019-07-08 DIAGNOSIS — R011 Cardiac murmur, unspecified: Secondary | ICD-10-CM

## 2019-07-08 DIAGNOSIS — I10 Essential (primary) hypertension: Secondary | ICD-10-CM

## 2019-07-18 NOTE — Progress Notes (Signed)
Follow up visit  Subjective:   Robert Hamilton, male    DOB: 03-28-1934, 85 y.o.   MRN: [id number]   Chief Complaint  Patient presents with  . Hypertension  . Heart Murmur  . Results    echo  . Follow-up    HPI    85 year old African-American male with hypertension, CKD, tobacco abuse, history of prostatectomy, diverticular bleed in 03/2019, asymptomatic nonsustained ventricular tachycardia 03/2019.  With new finding of holosystolic murmur, I ordered an echocardiogram, which showed severe MR, new LV dysfunction.   Patient is here with his daughter today.  He denies any complaints of leg swelling, shortness of breath, chest pain.  His fatigue and sleepiness that had started after initiation of carvedilol, has now resolved.  Results of echocardiogram discussed with patient and daughter in detail.  Past Medical History:  Diagnosis Date  . Anemia   . Cancer ([place])   . Chronic kidney disease   . Diverticulosis   . GERD (gastroesophageal reflux disease)   . Hypertension      Past Surgical History:  Procedure Laterality Date  . HERNIA REPAIR    . PROSTATE SURGERY    . PROSTATECTOMY       Social History   Socioeconomic History  . Marital status: Unknown    Spouse name: Not on file  . Number of children: 2  . Years of education: Not on file  . Highest education level: Not on file  Occupational History  . Not on file  Social Needs  . Financial resource strain: Not on file  . Food insecurity    Worry: Not on file    Inability: Not on file  . Transportation needs    Medical: Not on file    Non-medical: Not on file  Tobacco Use  . Smoking status: Current Some Day Smoker    Packs/day: 0.25    Types: Cigarettes  . Smokeless tobacco: Never Used  . Tobacco comment: 1-2 per day  Substance and Sexual Activity  . Alcohol use: Not Currently    Comment: stopped 12/2018  . Drug use: No  . Sexual activity: Yes  Lifestyle  . Physical activity    Days per week:  Not on file    Minutes per session: Not on file  . Stress: Not on file  Relationships  . Social Herbalist on phone: Not on file    Gets together: Not on file    Attends religious service: Not on file    Active member of club or organization: Not on file    Attends meetings of clubs or organizations: Not on file    Relationship status: Not on file  . Intimate partner violence    Fear of current or ex partner: Not on file    Emotionally abused: Not on file    Physically abused: Not on file    Forced sexual activity: Not on file  Other Topics Concern  . Not on file  Social History Narrative  . Not on file      Current Outpatient Medications on File Prior to Visit  Medication Sig Dispense Refill  . allopurinol (ZYLOPRIM) 300 MG tablet Take 150 mg by mouth daily.    Marland Kitchen amLODipine (NORVASC) 2.5 MG tablet Take 1 tablet (2.5 mg total) by mouth daily. 30 tablet 3  . carvedilol (COREG) 12.5 MG tablet Take 1 tablet (12.5 mg total) by mouth 2 (two) times daily. 527 tablet 3  . folic acid (  FOLVITE) 1 MG tablet Take 1 mg by mouth daily.    Marland Kitchen thiamine (VITAMIN B-1) 100 MG tablet Take 100 mg by mouth daily.     No current facility-administered medications on file prior to visit.     Cardiovascular studies:  Echocardiogram 07/08/2019: Left ventricle cavity is normal in size. Moderate concentric hypertrophy of the left ventricle. Moderate global hypokinesis. LVEF 30-35%. Diastolic function not assessed due to severity of mitral regurgitation.   Left atrial cavity is moderately dilated. Moderate to severe mitral regurgitation. Moderate tricuspid regurgitation. Estimated pulmonary artery systolic pressure is 44 mmHg.  Small circumferential pericardial effusion. Compared to previous study on 03/21/2019, LVEF is significantly lower; Valvular regurgitation, pulmonary hypertension, and pericardial effusion are new.    EKG 06/25/2019: Sinus rhythm 66 bpm. Frequent ectopic ventricular  beats   Lexiscan nuclear stress test 03/22/2019: 1. No reversible ischemia or infarction. 2. Minimal septal and inferior hypokinesis. 3. Left ventricular ejection fraction 40% 4. Non invasive risk stratification*: Low  Echocardiogram 03/21/2019:  1. The left ventricle has normal systolic function with an ejection fraction of 60-65%. The cavity size was normal. There is moderate concentric left ventricular hypertrophy. Left ventricular diastolic function could not be evaluated due to  nondiagnostic images.  2. The right ventricle has normal systolic function. The cavity was normal. There is no increase in right ventricular wall thickness.  3. Right atrial size was moderately dilated.  4. The aortic valve is tricuspid. Aortic valve regurgitation was not assessed by color flow Doppler.  5. There is a mobile filamentous density in the RA that likely respresents a Chiari network.    Recent labs: Results for KION, HUNTSBERRY (MRN [id number]) as of 06/25/2019 07:40  Ref. Range 03/22/2019 03:38 03/24/2019 58:09  BASIC METABOLIC PANEL Unknown Rpt (A) Rpt (A)  Sodium Latest Ref Range: 135 - 145 mmol/L 140 142  Potassium Latest Ref Range: 3.5 - 5.1 mmol/L 3.7 4.1  Chloride Latest Ref Range: 98 - 111 mmol/L 111 111  CO2 Latest Ref Range: 22 - 32 mmol/L 20 (L) 22  Glucose Latest Ref Range: 70 - 99 mg/dL 90 108 (H)  BUN Latest Ref Range: 8 - 23 mg/dL 12 14  Creatinine Latest Ref Range: 0.61 - 1.24 mg/dL 1.20 1.37 (H)  Calcium Latest Ref Range: 8.9 - 10.3 mg/dL 8.7 (L) 9.1  Anion gap Latest Ref Range: 5 - 15  9 9   Magnesium Latest Ref Range: 1.7 - 2.4 mg/dL  1.9  GFR, Est Non African American Latest Ref Range: >60 mL/min 56 (L) 48 (L)  GFR, Est African American Latest Ref Range: >60 mL/min >60 55 (L)  Troponin I Latest Ref Range: <0.03 ng/mL <0.03    Results for [facility] TERREL, NESHEIWAT (MRN [id number]) as of 06/25/2019 07:40  Ref. Range 03/23/2019 04:14  WBC Latest Ref Range: 4.0 - 10.5 K/uL 6.6  RBC  Latest Ref Range: 4.22 - 5.81 MIL/uL 3.22 (L)  Hemoglobin Latest Ref Range: 13.0 - 17.0 g/dL 9.0 (L)  HCT Latest Ref Range: 39.0 - 52.0 % 27.4 (L)  MCV Latest Ref Range: 80.0 - 100.0 fL 85.1  MCH Latest Ref Range: 26.0 - 34.0 pg 28.0  MCHC Latest Ref Range: 30.0 - 36.0 g/dL 32.8  RDW Latest Ref Range: 11.5 - 15.5 % 13.9  Platelets Latest Ref Range: 150 - 400 K/uL 126 (L)  nRBC Latest Ref Range: 0.0 - 0.2 % 0.0    Review of Systems  Constitution: Negative for decreased appetite, malaise/fatigue, weight gain  and weight loss.  HENT: Negative for congestion.   Eyes: Negative for visual disturbance.  Cardiovascular: Negative for chest pain, dyspnea on exertion, leg swelling, palpitations and syncope.  Respiratory: Negative for cough.   Endocrine: Negative for cold intolerance.  Hematologic/Lymphatic: Does not bruise/bleed easily.  Skin: Negative for itching and rash.  Musculoskeletal: Negative for myalgias.  Gastrointestinal: Negative for abdominal pain, nausea and vomiting.  Genitourinary: Negative for dysuria.  Neurological: Negative for dizziness and weakness.  Psychiatric/Behavioral: The patient is not nervous/anxious.   All other systems reviewed and are negative.       Vitals:   07/19/19 1027  BP: (!) 158/93  Pulse: 64  SpO2: 98%     Body mass index is 23.21 kg/m. Filed Weights   07/19/19 1027  Weight: 157 lb 3.2 oz (71.3 kg)     Objective:   Physical Exam  Constitutional: He is oriented to person, place, and time. He appears well-developed and well-nourished. No distress.  HENT:  Head: Normocephalic and atraumatic.  Eyes: Pupils are equal, round, and reactive to light. Conjunctivae are normal.  Neck: No JVD present.  Cardiovascular: Normal rate and regular rhythm. Frequent extrasystoles are present.  Murmur heard. High-pitched blowing holosystolic murmur is present with a grade of 3/6 at the apex. Pulmonary/Chest: Effort normal and breath sounds normal. He  has no wheezes. He has no rales.  Abdominal: Soft. Bowel sounds are normal. There is no rebound.  Musculoskeletal:        General: No edema.  Lymphadenopathy:    He has no cervical adenopathy.  Neurological: He is alert and oriented to person, place, and time. No cranial nerve deficit.  Skin: Skin is warm and dry.  Psychiatric: He has a normal mood and affect.  Nursing note and vitals reviewed.       Assessment & Recommendations:   85 year old African-American male with hypertension, CKD, tobacco abuse, history of prostatectomy, diverticular bleed in 03/2019, asymptomatic nonsustained ventricular tachycardia 03/2019, now with new LV dysfunction.  LV dysfunction: EF 30-35%. Moderate to severe MR, moderate TR. PASP 44 mmHg. Differentials for new LV dysfunction include previously undiagnosed myocardial ischemia, or arrhythmia induced cardiomyopathy in the setting of nonsustained VT and frequent PVCs. He had nuclear testing with no ischemia in 03/2019. While his EF was noted to be 40% on stress testing, EF was normal on echocardiogram. Given new drop in LVEF, in spite of no ischemia on nuclear testing, I recommended coronary angiography, along with left and right heart catheterization.  However, patient would like to pursue conservative management at this time and does not want to undergo cardiac catheterization. Given LV dysfunction without any symptoms- ACC/AHA class I heart failure, I started him on losartan 50 mg daily. Continue carvedilol 12.5 mg twice daily. I will perform 24-hour Holter monitor to assess his PVC burden. Follow-up in 4 weeks.  NSVT: Clinically asymptomatic.  Management as above.  Hypertension Suboptimal control. Continue amlodipine to 2.5 mg, along with carvedilol and losartan.  [name], MD [facility] [place] Pager: [phone] Office: [phone] If no answer Cell [phone]

## 2019-07-19 ENCOUNTER — Ambulatory Visit (INDEPENDENT_AMBULATORY_CARE_PROVIDER_SITE_OTHER): Payer: Medicare Other | Admitting: Cardiology

## 2019-07-19 ENCOUNTER — Encounter: Payer: Self-pay | Admitting: Cardiology

## 2019-07-19 ENCOUNTER — Other Ambulatory Visit: Payer: Self-pay

## 2019-07-19 ENCOUNTER — Ambulatory Visit: Payer: Medicare Other

## 2019-07-19 VITALS — BP 158/93 | HR 64 | Ht 69.0 in | Wt 157.2 lb

## 2019-07-19 DIAGNOSIS — I340 Nonrheumatic mitral (valve) insufficiency: Secondary | ICD-10-CM | POA: Insufficient documentation

## 2019-07-19 DIAGNOSIS — I4729 Other ventricular tachycardia: Secondary | ICD-10-CM

## 2019-07-19 DIAGNOSIS — I472 Ventricular tachycardia: Secondary | ICD-10-CM

## 2019-07-19 DIAGNOSIS — I5022 Chronic systolic (congestive) heart failure: Secondary | ICD-10-CM | POA: Diagnosis not present

## 2019-07-19 DIAGNOSIS — I5042 Chronic combined systolic (congestive) and diastolic (congestive) heart failure: Secondary | ICD-10-CM | POA: Insufficient documentation

## 2019-07-19 MED ORDER — LOSARTAN POTASSIUM 50 MG PO TABS: 50.0000 mg | ORAL_TABLET | Freq: Every day | ORAL | 3 refills | Status: DC

## 2019-07-20 DIAGNOSIS — I472 Ventricular tachycardia: Secondary | ICD-10-CM | POA: Diagnosis not present

## 2019-07-26 DIAGNOSIS — R7303 Prediabetes: Secondary | ICD-10-CM | POA: Diagnosis not present

## 2019-07-26 DIAGNOSIS — Z13220 Encounter for screening for lipoid disorders: Secondary | ICD-10-CM | POA: Diagnosis not present

## 2019-07-26 DIAGNOSIS — I1310 Hypertensive heart and chronic kidney disease without heart failure, with stage 1 through stage 4 chronic kidney disease, or unspecified chronic kidney disease: Secondary | ICD-10-CM | POA: Diagnosis not present

## 2019-08-02 DIAGNOSIS — N183 Chronic kidney disease, stage 3 (moderate): Secondary | ICD-10-CM | POA: Diagnosis not present

## 2019-08-02 DIAGNOSIS — I472 Ventricular tachycardia: Secondary | ICD-10-CM | POA: Diagnosis not present

## 2019-08-02 DIAGNOSIS — I5022 Chronic systolic (congestive) heart failure: Secondary | ICD-10-CM | POA: Diagnosis not present

## 2019-08-02 DIAGNOSIS — M109 Gout, unspecified: Secondary | ICD-10-CM | POA: Diagnosis not present

## 2019-08-02 DIAGNOSIS — I10 Essential (primary) hypertension: Secondary | ICD-10-CM | POA: Diagnosis not present

## 2019-08-02 DIAGNOSIS — E785 Hyperlipidemia, unspecified: Secondary | ICD-10-CM | POA: Diagnosis not present

## 2019-08-02 DIAGNOSIS — R7303 Prediabetes: Secondary | ICD-10-CM | POA: Diagnosis not present

## 2019-08-02 DIAGNOSIS — I1310 Hypertensive heart and chronic kidney disease without heart failure, with stage 1 through stage 4 chronic kidney disease, or unspecified chronic kidney disease: Secondary | ICD-10-CM | POA: Diagnosis not present

## 2019-08-27 ENCOUNTER — Ambulatory Visit (INDEPENDENT_AMBULATORY_CARE_PROVIDER_SITE_OTHER): Payer: Medicare Other | Admitting: Cardiology

## 2019-08-27 ENCOUNTER — Encounter: Payer: Self-pay | Admitting: Cardiology

## 2019-08-27 ENCOUNTER — Other Ambulatory Visit: Payer: Self-pay

## 2019-08-27 VITALS — BP 163/98 | HR 77 | Ht 69.0 in | Wt 157.9 lb

## 2019-08-27 DIAGNOSIS — I472 Ventricular tachycardia: Secondary | ICD-10-CM | POA: Diagnosis not present

## 2019-08-27 DIAGNOSIS — I5022 Chronic systolic (congestive) heart failure: Secondary | ICD-10-CM

## 2019-08-27 DIAGNOSIS — I4729 Other ventricular tachycardia: Secondary | ICD-10-CM

## 2019-08-27 DIAGNOSIS — I10 Essential (primary) hypertension: Secondary | ICD-10-CM | POA: Diagnosis not present

## 2019-08-27 MED ORDER — BIDIL 20-37.5 MG PO TABS: 1.0000 | ORAL_TABLET | Freq: Two times a day (BID) | ORAL | 2 refills | Status: DC

## 2019-08-27 NOTE — Progress Notes (Signed)
Follow up visit  Subjective:   Robert Hamilton, male    DOB: 01-Sep-1934, 85 y.o.   MRN: [id number]   Chief Complaint  Patient presents with  . Congestive Heart Failure  . Follow-up    HPI    85 year old African-American male with hypertension, CKD, tobacco abuse, history of prostatectomy, diverticular bleed in 03/2019, asymptomatic nonsustained ventricular tachycardia 03/2019.  Patient was found to have new holosystolic murmur. Echocardiogram showed EF 30-35%, moderate to severe MR, moderate TR. PASP 44 mmHg. Differentials for new LV dysfunction include previously undiagnosed myocardial ischemia, or arrhythmia induced cardiomyopathy in the setting of nonsustained VT and frequent PVCs. He had nuclear testing with no ischemia in 03/2019. While his EF was noted to be 40% on stress testing, EF was normal on echocardiogram. Given new drop in LVEF, in spite of no ischemia on nuclear testing, I recommended coronary angiography, along with left and right heart catheterization.  However, patient would like to pursue conservative management at this time and does not want to undergo cardiac catheterization. Given LV dysfunction without any symptoms- ACC/AHA class I heart failure, I started him on losartan 50 mg daily. Continue carvedilol 12.5 mg twice daily. Holter monitor showed dominant rhythm sinus, HR 45-102 bpm, avg HR 69 bpm, with 9.8% ventricular ectopy and 3.8% supraventricular ectopy.   Patient is here for 4 week follow up today. He denies chest pain, shortness of breath, palpitations, leg edema, orthopnea, PND, TIA/syncope. Blood pressure is elevated.   Past Medical History:  Diagnosis Date  . Anemia   . Cancer ([place])   . Chronic kidney disease   . Diverticulosis   . GERD (gastroesophageal reflux disease)   . Hypertension      Past Surgical History:  Procedure Laterality Date  . HERNIA REPAIR    . PROSTATE SURGERY    . PROSTATECTOMY       Social History   Socioeconomic  History  . Marital status: Divorced    Spouse name: Not on file  . Number of children: 2  . Years of education: Not on file  . Highest education level: Not on file  Occupational History  . Not on file  Social Needs  . Financial resource strain: Not on file  . Food insecurity    Worry: Not on file    Inability: Not on file  . Transportation needs    Medical: Not on file    Non-medical: Not on file  Tobacco Use  . Smoking status: Current Some Day Smoker    Packs/day: 0.25    Types: Cigarettes  . Smokeless tobacco: Never Used  . Tobacco comment: 1-2 per day  Substance and Sexual Activity  . Alcohol use: Not Currently    Comment: stopped 12/2018  . Drug use: No  . Sexual activity: Yes  Lifestyle  . Physical activity    Days per week: Not on file    Minutes per session: Not on file  . Stress: Not on file  Relationships  . Social Herbalist on phone: Not on file    Gets together: Not on file    Attends religious service: Not on file    Active member of club or organization: Not on file    Attends meetings of clubs or organizations: Not on file    Relationship status: Not on file  . Intimate partner violence    Fear of current or ex partner: Not on file    Emotionally abused: Not on  file    Physically abused: Not on file    Forced sexual activity: Not on file  Other Topics Concern  . Not on file  Social History Narrative  . Not on file      Current Outpatient Medications on File Prior to Visit  Medication Sig Dispense Refill  . allopurinol (ZYLOPRIM) 300 MG tablet Take 150 mg by mouth daily.    Marland Kitchen amLODipine (NORVASC) 2.5 MG tablet Take 1 tablet (2.5 mg total) by mouth daily. 30 tablet 3  . carvedilol (COREG) 12.5 MG tablet Take 1 tablet (12.5 mg total) by mouth 2 (two) times daily. 374 tablet 3  . folic acid (FOLVITE) 1 MG tablet Take 1 mg by mouth daily.    Marland Kitchen losartan (COZAAR) 50 MG tablet Take 1 tablet (50 mg total) by mouth daily. 30 tablet 3  .  pantoprazole (PROTONIX) 40 MG tablet Take 40 mg by mouth daily.    Marland Kitchen thiamine (VITAMIN B-1) 100 MG tablet Take 100 mg by mouth daily.     No current facility-administered medications on file prior to visit.     Cardiovascular studies:  EKG 08/27/2019: Sinus rhythm 78 bpm. Frequent multiform ectopic ventricular beats  Left bundle branch block.   Holter monitor 07/19/2019:  Dominant rhythm sinus. HR 45-102 bpm. Avg HR 69 bpm.  9.8% ventricular ectopy.  3.8% supraventricular ectopy.  No AFib/aflutter/>3sec pauses/high grade AV block/sustained VT.   Echocardiogram 07/08/2019: Left ventricle cavity is normal in size. Moderate concentric hypertrophy of the left ventricle. Moderate global hypokinesis. LVEF 30-35%. Diastolic function not assessed due to severity of mitral regurgitation.   Left atrial cavity is moderately dilated. Moderate to severe mitral regurgitation. Moderate tricuspid regurgitation. Estimated pulmonary artery systolic pressure is 44 mmHg.  Small circumferential pericardial effusion. Compared to previous study on 03/21/2019, LVEF is significantly lower; Valvular regurgitation, pulmonary hypertension, and pericardial effusion are new.    EKG 06/25/2019: Sinus rhythm 66 bpm. Frequent ectopic ventricular beats   Lexiscan nuclear stress test 03/22/2019: 1. No reversible ischemia or infarction. 2. Minimal septal and inferior hypokinesis. 3. Left ventricular ejection fraction 40% 4. Non invasive risk stratification*: Low  Echocardiogram 03/21/2019:  1. The left ventricle has normal systolic function with an ejection fraction of 60-65%. The cavity size was normal. There is moderate concentric left ventricular hypertrophy. Left ventricular diastolic function could not be evaluated due to  nondiagnostic images.  2. The right ventricle has normal systolic function. The cavity was normal. There is no increase in right ventricular wall thickness.  3. Right atrial size was  moderately dilated.  4. The aortic valve is tricuspid. Aortic valve regurgitation was not assessed by color flow Doppler.  5. There is a mobile filamentous density in the RA that likely respresents a Chiari network.    Recent labs: Results for KAJUAN, GUYTON (MRN [id number]) as of 06/25/2019 07:40  Ref. Range 03/22/2019 03:38 03/24/2019 44:92  BASIC METABOLIC PANEL Unknown Rpt (A) Rpt (A)  Sodium Latest Ref Range: 135 - 145 mmol/L 140 142  Potassium Latest Ref Range: 3.5 - 5.1 mmol/L 3.7 4.1  Chloride Latest Ref Range: 98 - 111 mmol/L 111 111  CO2 Latest Ref Range: 22 - 32 mmol/L 20 (L) 22  Glucose Latest Ref Range: 70 - 99 mg/dL 90 108 (H)  BUN Latest Ref Range: 8 - 23 mg/dL 12 14  Creatinine Latest Ref Range: 0.61 - 1.24 mg/dL 1.20 1.37 (H)  Calcium Latest Ref Range: 8.9 - 10.3 mg/dL 8.7 (L) 9.1  Anion gap Latest Ref Range: 5 - 15  9 9   Magnesium Latest Ref Range: 1.7 - 2.4 mg/dL  1.9  GFR, Est Non African American Latest Ref Range: >60 mL/min 56 (L) 48 (L)  GFR, Est African American Latest Ref Range: >60 mL/min >60 55 (L)  Troponin I Latest Ref Range: <0.03 ng/mL <0.03    Results for [facility] (MRN [id number]) as of 06/25/2019 07:40  Ref. Range 03/23/2019 04:14  WBC Latest Ref Range: 4.0 - 10.5 K/uL 6.6  RBC Latest Ref Range: 4.22 - 5.81 MIL/uL 3.22 (L)  Hemoglobin Latest Ref Range: 13.0 - 17.0 g/dL 9.0 (L)  HCT Latest Ref Range: 39.0 - 52.0 % 27.4 (L)  MCV Latest Ref Range: 80.0 - 100.0 fL 85.1  MCH Latest Ref Range: 26.0 - 34.0 pg 28.0  MCHC Latest Ref Range: 30.0 - 36.0 g/dL 32.8  RDW Latest Ref Range: 11.5 - 15.5 % 13.9  Platelets Latest Ref Range: 150 - 400 K/uL 126 (L)  nRBC Latest Ref Range: 0.0 - 0.2 % 0.0    Review of Systems  Constitution: Negative for decreased appetite, malaise/fatigue, weight gain and weight loss.  HENT: Negative for congestion.   Eyes: Negative for visual disturbance.  Cardiovascular: Negative for chest pain, dyspnea on exertion, leg  swelling, palpitations and syncope.  Respiratory: Negative for cough.   Endocrine: Negative for cold intolerance.  Hematologic/Lymphatic: Does not bruise/bleed easily.  Skin: Negative for itching and rash.  Musculoskeletal: Negative for myalgias.  Gastrointestinal: Negative for abdominal pain, nausea and vomiting.  Genitourinary: Negative for dysuria.  Neurological: Negative for dizziness and weakness.  Psychiatric/Behavioral: The patient is not nervous/anxious.   All other systems reviewed and are negative.       Vitals:   08/27/19 1021  BP: (!) 163/98  Pulse: 77  SpO2: 100%     Body mass index is 23.32 kg/m. Filed Weights   08/27/19 1021  Weight: 157 lb 14.4 oz (71.6 kg)     Objective:   Physical Exam  Constitutional: He is oriented to person, place, and time. He appears well-developed and well-nourished. No distress.  HENT:  Head: Normocephalic and atraumatic.  Eyes: Pupils are equal, round, and reactive to light. Conjunctivae are normal.  Neck: No JVD present.  Cardiovascular: Normal rate and regular rhythm. Frequent extrasystoles are present.  Murmur heard. High-pitched blowing holosystolic murmur is present with a grade of 3/6 at the apex. Pulmonary/Chest: Effort normal and breath sounds normal. He has no wheezes. He has no rales.  Abdominal: Soft. Bowel sounds are normal. There is no rebound.  Musculoskeletal:        General: No edema.  Lymphadenopathy:    He has no cervical adenopathy.  Neurological: He is alert and oriented to person, place, and time. No cranial nerve deficit.  Skin: Skin is warm and dry.  Psychiatric: He has a normal mood and affect.  Nursing note and vitals reviewed.       Assessment & Recommendations:   85 year old African-American male with hypertension, CKD, tobacco abuse, history of prostatectomy, diverticular bleed in 03/2019, asymptomatic nonsustained ventricular tachycardia 03/2019, now with new LV dysfunction.  LV  dysfunction: EF 30-35%. Moderate to severe MR, moderate TR. PASP 44 mmHg. Patient prefers conservative management and would not like to pursue heart catheterization. Holter monitor does not suggest that this arrhythmia induced cardiomyopathy.  Given LV dysfunction without any symptoms- ACC/AHA class I heart failure, continue losartan 50 mg daily, carvedilol 12.5 mg twice daily. Added  Bidil 20-37.5 mg bid. Will get recent labs performed by PCP.   NSVT: Clinically asymptomatic. Has frequent PVC's on EKG, likely due to his cardiomyopathy. Continue medical management as above.  Hypertension Suboptimal control. Management as above.   [name], MD [facility] Pager: [phone] Office: [phone] If no answer Cell [phone]

## 2019-09-01 DIAGNOSIS — E785 Hyperlipidemia, unspecified: Secondary | ICD-10-CM | POA: Diagnosis not present

## 2019-09-01 DIAGNOSIS — I129 Hypertensive chronic kidney disease with stage 1 through stage 4 chronic kidney disease, or unspecified chronic kidney disease: Secondary | ICD-10-CM | POA: Diagnosis not present

## 2019-09-01 DIAGNOSIS — R7303 Prediabetes: Secondary | ICD-10-CM | POA: Diagnosis not present

## 2019-09-01 DIAGNOSIS — I5022 Chronic systolic (congestive) heart failure: Secondary | ICD-10-CM | POA: Diagnosis not present

## 2019-10-01 DIAGNOSIS — I129 Hypertensive chronic kidney disease with stage 1 through stage 4 chronic kidney disease, or unspecified chronic kidney disease: Secondary | ICD-10-CM | POA: Diagnosis not present

## 2019-10-01 DIAGNOSIS — R7303 Prediabetes: Secondary | ICD-10-CM | POA: Diagnosis not present

## 2019-10-01 DIAGNOSIS — I5022 Chronic systolic (congestive) heart failure: Secondary | ICD-10-CM | POA: Diagnosis not present

## 2019-10-01 DIAGNOSIS — E785 Hyperlipidemia, unspecified: Secondary | ICD-10-CM | POA: Diagnosis not present

## 2019-11-01 DIAGNOSIS — I129 Hypertensive chronic kidney disease with stage 1 through stage 4 chronic kidney disease, or unspecified chronic kidney disease: Secondary | ICD-10-CM | POA: Diagnosis not present

## 2019-11-01 DIAGNOSIS — R7303 Prediabetes: Secondary | ICD-10-CM | POA: Diagnosis not present

## 2019-11-01 DIAGNOSIS — E785 Hyperlipidemia, unspecified: Secondary | ICD-10-CM | POA: Diagnosis not present

## 2019-11-01 DIAGNOSIS — I5022 Chronic systolic (congestive) heart failure: Secondary | ICD-10-CM | POA: Diagnosis not present

## 2019-11-09 ENCOUNTER — Telehealth: Payer: Self-pay

## 2019-11-29 ENCOUNTER — Ambulatory Visit: Payer: Medicare Other | Admitting: Cardiology

## 2019-12-06 DIAGNOSIS — E785 Hyperlipidemia, unspecified: Secondary | ICD-10-CM | POA: Diagnosis not present

## 2019-12-06 DIAGNOSIS — I1310 Hypertensive heart and chronic kidney disease without heart failure, with stage 1 through stage 4 chronic kidney disease, or unspecified chronic kidney disease: Secondary | ICD-10-CM | POA: Diagnosis not present

## 2019-12-06 DIAGNOSIS — N1832 Chronic kidney disease, stage 3b: Secondary | ICD-10-CM | POA: Diagnosis not present

## 2019-12-06 DIAGNOSIS — R7303 Prediabetes: Secondary | ICD-10-CM | POA: Diagnosis not present

## 2019-12-10 ENCOUNTER — Telehealth (INDEPENDENT_AMBULATORY_CARE_PROVIDER_SITE_OTHER): Payer: Medicare Other | Admitting: Cardiology

## 2019-12-10 DIAGNOSIS — I5022 Chronic systolic (congestive) heart failure: Secondary | ICD-10-CM | POA: Diagnosis not present

## 2019-12-10 DIAGNOSIS — I10 Essential (primary) hypertension: Secondary | ICD-10-CM

## 2019-12-10 MED ORDER — BIDIL 20-37.5 MG PO TABS: 1.0000 | ORAL_TABLET | Freq: Three times a day (TID) | ORAL | 3 refills | Status: DC

## 2019-12-10 NOTE — Progress Notes (Signed)
Follow up visit  I connected with the patient on 12/10/2019 by a telephone call and verified that I am speaking with the correct person using two identifiers.     I offered the patient a video enabled application for a virtual visit. Unfortunately, this could not be accomplished due to technical difficulties/lack of video enabled phone/computer. I discussed the limitations of evaluation and management by telemedicine and the availability of in person appointments. The patient expressed understanding and agreed to proceed.   This visit type was conducted due to national recommendations for restrictions regarding the COVID-19 Pandemic (e.g. social distancing).  This format is felt to be most appropriate for this patient at this time.  All issues noted in this document were discussed and addressed.  No physical exam was performed (except for noted visual exam findings with Tele health visits).  The patient has consented to conduct a Tele health visit and understands insurance will be billed.   Subjective:   Robert Hamilton, male    DOB: 1934/09/16, 86 y.o.   MRN: [id number]   Chief Complaint  Patient presents with  . Cardiomyopathy    HPI   86 year old African-American male with hypertension, CKD, tobacco abuse, history of prostatectomy, diverticular bleed in 03/2019, asymptomatic nonsustained ventricular tachycardia 94/0768, LV systolic dysfunction without clinical evidence of heart failure.   Patient is doing well and denies any symptoms. He recently had labs through his PCP. I do not have the results available. BP is well controlled.    Current Outpatient Medications on File Prior to Visit  Medication Sig Dispense Refill  . allopurinol (ZYLOPRIM) 300 MG tablet Take 150 mg by mouth daily.    Marland Kitchen amLODipine (NORVASC) 2.5 MG tablet Take 1 tablet (2.5 mg total) by mouth daily. 30 tablet 3  . carvedilol (COREG) 12.5 MG tablet Take 1 tablet (12.5 mg total) by mouth 2 (two) times daily. 088  tablet 3  . folic acid (FOLVITE) 1 MG tablet Take 1 mg by mouth daily.    . isosorbide-hydrALAZINE (BIDIL) 20-37.5 MG tablet Take 1 tablet by mouth 2 (two) times daily. 60 tablet 2  . losartan (COZAAR) 50 MG tablet Take 1 tablet (50 mg total) by mouth daily. 30 tablet 3  . pantoprazole (PROTONIX) 40 MG tablet Take 40 mg by mouth daily.    Marland Kitchen thiamine (VITAMIN B-1) 100 MG tablet Take 100 mg by mouth daily.     No current facility-administered medications on file prior to visit.    Cardiovascular studies:  EKG 08/27/2019: Sinus rhythm 78 bpm. Frequent multiform ectopic ventricular beats  Left bundle branch block.   Holter monitor 07/19/2019:  Dominant rhythm sinus. HR 45-102 bpm. Avg HR 69 bpm.  9.8% ventricular ectopy.  3.8% supraventricular ectopy.  No AFib/aflutter/>3sec pauses/high grade AV block/sustained VT.   Echocardiogram 07/08/2019: Left ventricle cavity is normal in size. Moderate concentric hypertrophy of the left ventricle. Moderate global hypokinesis. LVEF 30-35%. Diastolic function not assessed due to severity of mitral regurgitation.   Left atrial cavity is moderately dilated. Moderate to severe mitral regurgitation. Moderate tricuspid regurgitation. Estimated pulmonary artery systolic pressure is 44 mmHg.  Small circumferential pericardial effusion. Compared to previous study on 03/21/2019, LVEF is significantly lower; Valvular regurgitation, pulmonary hypertension, and pericardial effusion are new.    EKG 06/25/2019: Sinus rhythm 66 bpm. Frequent ectopic ventricular beats   Lexiscan nuclear stress test 03/22/2019: 1. No reversible ischemia or infarction. 2. Minimal septal and inferior hypokinesis. 3. Left ventricular ejection fraction 40% 4.  Non invasive risk stratification*: Low  Echocardiogram 03/21/2019:  1. The left ventricle has normal systolic function with an ejection fraction of 60-65%. The cavity size was normal. There is moderate concentric left  ventricular hypertrophy. Left ventricular diastolic function could not be evaluated due to  nondiagnostic images.  2. The right ventricle has normal systolic function. The cavity was normal. There is no increase in right ventricular wall thickness.  3. Right atrial size was moderately dilated.  4. The aortic valve is tricuspid. Aortic valve regurgitation was not assessed by color flow Doppler.  5. There is a mobile filamentous density in the RA that likely respresents a Chiari network.    Recent labs: 03/24/2019: Glucose 108, BUN/Cr 14/1.37. EGFR 48. Na/K 142/4.1.  H/H 9/27.4. MCV 85. Platelets 126   Review of Systems  Cardiovascular: Negative for chest pain, dyspnea on exertion, leg swelling, palpitations and syncope.        Vitals:   12/10/19 1045  BP: 134/68  Pulse: 91     Objective:   Physical Exam   Not performed. Telephone visit.      Assessment & Recommendations:   86 year old African-American male with hypertension, CKD, tobacco abuse, history of prostatectomy, diverticular bleed in 03/2019, asymptomatic nonsustained ventricular tachycardia 84/3343, LV systolic dysfunction without clinical evidence of heart failure.   LV dysfunction: EF 30-35%. Moderate to severe MR, moderate TR. PASP 44 mmHg. Patient prefers conservative management and would not like to pursue heart catheterization. Holter monitor does not suggest that this arrhythmia induced cardiomyopathy.  Given LV dysfunction without any symptoms- ACC/AHA class I heart failure, continue losartan 50 mg daily, carvedilol 12.5 mg twice daily. Increase Bidil 20-37.5 mg to tid. Stop amlodipine 2.5 mg daily.  Will get recent labs performed by PCP.   NSVT: Clinically asymptomatic. Has frequent PVC's on EKG, likely due to his cardiomyopathy. Continue medical management as above.  Hypertension Well controlled.  F/u in 3 months.   [name], MD [facility] Pager:  [phone] Office: [phone] If no answer Cell [phone]

## 2019-12-31 DIAGNOSIS — E785 Hyperlipidemia, unspecified: Secondary | ICD-10-CM | POA: Diagnosis not present

## 2019-12-31 DIAGNOSIS — R7303 Prediabetes: Secondary | ICD-10-CM | POA: Diagnosis not present

## 2019-12-31 DIAGNOSIS — I1310 Hypertensive heart and chronic kidney disease without heart failure, with stage 1 through stage 4 chronic kidney disease, or unspecified chronic kidney disease: Secondary | ICD-10-CM | POA: Diagnosis not present

## 2020-01-04 ENCOUNTER — Other Ambulatory Visit: Payer: Self-pay | Admitting: Cardiology

## 2020-01-04 DIAGNOSIS — I10 Essential (primary) hypertension: Secondary | ICD-10-CM

## 2020-01-04 DIAGNOSIS — I472 Ventricular tachycardia: Secondary | ICD-10-CM

## 2020-01-04 DIAGNOSIS — I4729 Other ventricular tachycardia: Secondary | ICD-10-CM

## 2020-01-13 DIAGNOSIS — I1310 Hypertensive heart and chronic kidney disease without heart failure, with stage 1 through stage 4 chronic kidney disease, or unspecified chronic kidney disease: Secondary | ICD-10-CM | POA: Diagnosis not present

## 2020-01-17 DIAGNOSIS — Z6822 Body mass index (BMI) 22.0-22.9, adult: Secondary | ICD-10-CM | POA: Diagnosis not present

## 2020-01-17 DIAGNOSIS — I1310 Hypertensive heart and chronic kidney disease without heart failure, with stage 1 through stage 4 chronic kidney disease, or unspecified chronic kidney disease: Secondary | ICD-10-CM | POA: Diagnosis not present

## 2020-01-17 DIAGNOSIS — M109 Gout, unspecified: Secondary | ICD-10-CM | POA: Diagnosis not present

## 2020-01-17 DIAGNOSIS — N1832 Chronic kidney disease, stage 3b: Secondary | ICD-10-CM | POA: Diagnosis not present

## 2020-01-18 DIAGNOSIS — H401133 Primary open-angle glaucoma, bilateral, severe stage: Secondary | ICD-10-CM | POA: Diagnosis not present

## 2020-01-27 ENCOUNTER — Other Ambulatory Visit: Payer: Self-pay | Admitting: Cardiology

## 2020-01-27 DIAGNOSIS — I10 Essential (primary) hypertension: Secondary | ICD-10-CM

## 2020-01-27 DIAGNOSIS — I5022 Chronic systolic (congestive) heart failure: Secondary | ICD-10-CM

## 2020-01-30 DIAGNOSIS — I1310 Hypertensive heart and chronic kidney disease without heart failure, with stage 1 through stage 4 chronic kidney disease, or unspecified chronic kidney disease: Secondary | ICD-10-CM | POA: Diagnosis not present

## 2020-01-30 DIAGNOSIS — R7303 Prediabetes: Secondary | ICD-10-CM | POA: Diagnosis not present

## 2020-01-30 DIAGNOSIS — N1832 Chronic kidney disease, stage 3b: Secondary | ICD-10-CM | POA: Diagnosis not present

## 2020-01-30 DIAGNOSIS — E785 Hyperlipidemia, unspecified: Secondary | ICD-10-CM | POA: Diagnosis not present

## 2020-02-16 DIAGNOSIS — I1310 Hypertensive heart and chronic kidney disease without heart failure, with stage 1 through stage 4 chronic kidney disease, or unspecified chronic kidney disease: Secondary | ICD-10-CM | POA: Diagnosis not present

## 2020-02-23 DIAGNOSIS — N1831 Chronic kidney disease, stage 3a: Secondary | ICD-10-CM | POA: Diagnosis not present

## 2020-02-23 DIAGNOSIS — Z6822 Body mass index (BMI) 22.0-22.9, adult: Secondary | ICD-10-CM | POA: Diagnosis not present

## 2020-02-23 DIAGNOSIS — R251 Tremor, unspecified: Secondary | ICD-10-CM | POA: Diagnosis not present

## 2020-02-23 DIAGNOSIS — Z139 Encounter for screening, unspecified: Secondary | ICD-10-CM | POA: Diagnosis not present

## 2020-03-01 DIAGNOSIS — I1310 Hypertensive heart and chronic kidney disease without heart failure, with stage 1 through stage 4 chronic kidney disease, or unspecified chronic kidney disease: Secondary | ICD-10-CM | POA: Diagnosis not present

## 2020-03-01 DIAGNOSIS — R7303 Prediabetes: Secondary | ICD-10-CM | POA: Diagnosis not present

## 2020-03-01 DIAGNOSIS — E785 Hyperlipidemia, unspecified: Secondary | ICD-10-CM | POA: Diagnosis not present

## 2020-03-01 DIAGNOSIS — N1831 Chronic kidney disease, stage 3a: Secondary | ICD-10-CM | POA: Diagnosis not present

## 2020-03-09 ENCOUNTER — Ambulatory Visit: Payer: Medicare Other | Admitting: Cardiology

## 2020-03-10 ENCOUNTER — Other Ambulatory Visit: Payer: Self-pay

## 2020-03-10 ENCOUNTER — Encounter: Payer: Self-pay | Admitting: Cardiology

## 2020-03-10 ENCOUNTER — Ambulatory Visit: Payer: Medicare Other | Admitting: Cardiology

## 2020-03-10 VITALS — BP 142/78 | HR 70 | Temp 98.3°F | Resp 17 | Ht 69.0 in | Wt 147.6 lb

## 2020-03-10 DIAGNOSIS — I340 Nonrheumatic mitral (valve) insufficiency: Secondary | ICD-10-CM | POA: Diagnosis not present

## 2020-03-10 DIAGNOSIS — I4729 Other ventricular tachycardia: Secondary | ICD-10-CM

## 2020-03-10 DIAGNOSIS — I5022 Chronic systolic (congestive) heart failure: Secondary | ICD-10-CM

## 2020-03-10 DIAGNOSIS — I472 Ventricular tachycardia: Secondary | ICD-10-CM

## 2020-03-10 DIAGNOSIS — I10 Essential (primary) hypertension: Secondary | ICD-10-CM

## 2020-03-10 MED ORDER — FUROSEMIDE 20 MG PO TABS: 20.0000 mg | ORAL_TABLET | ORAL | 3 refills | Status: DC | PRN

## 2020-03-10 NOTE — Patient Instructions (Addendum)
Take carvedilol 1/2 tablet of 12.5 mg twice daily. Take lasix 20 mg as needed F/u in 3 months

## 2020-03-10 NOTE — Progress Notes (Signed)
Follow up visit   Subjective:   Robert Hamilton, male    DOB: 1934/03/12, 86 y.o.   MRN: [id number]   Chief Complaint  Patient presents with  . HFrEF  . Follow-up    3 month  . Congestive Heart Failure    HPI   86 year old African-American male with hypertension, CKD, tobacco abuse, history of prostatectomy, diverticular bleed in 03/2019, asymptomatic nonsustained ventricular tachycardia 69/7948, LV systolic dysfunction without clinical evidence of heart failure.   Patient has occasional symptoms of orthopnea. He denies chest pain or dyspnea with his limited physical activity.    Current Outpatient Medications on File Prior to Visit  Medication Sig Dispense Refill  . allopurinol (ZYLOPRIM) 300 MG tablet Take 100 mg by mouth daily.     Marland Kitchen BIDIL 20-37.5 MG tablet TAKE 1 TABLET BY MOUTH TWICE DAILY 180 tablet 0  . carvedilol (COREG) 12.5 MG tablet Take 1 tablet (12.5 mg total) by mouth 2 (two) times daily. 016 tablet 1  . folic acid (FOLVITE) 1 MG tablet Take 1 mg by mouth daily.    Marland Kitchen losartan (COZAAR) 50 MG tablet Take 1 tablet (50 mg total) by mouth daily. 30 tablet 3  . pantoprazole (PROTONIX) 40 MG tablet Take 40 mg by mouth daily.    Marland Kitchen thiamine (VITAMIN B-1) 100 MG tablet Take 100 mg by mouth daily.     No current facility-administered medications on file prior to visit.    Cardiovascular studies:  EKG 03/10/2020: Sinus rhythm 74 bpm Mobitz type 1 AV block. Left bundle branch block.  Occasional PVC.   Holter monitor 07/19/2019:  Dominant rhythm sinus. HR 45-102 bpm. Avg HR 69 bpm.  9.8% ventricular ectopy.  3.8% supraventricular ectopy.  No AFib/aflutter/>3sec pauses/high grade AV block/sustained VT.   Echocardiogram 07/08/2019: Left ventricle cavity is normal in size. Moderate concentric hypertrophy of the left ventricle. Moderate global hypokinesis. LVEF 30-35%. Diastolic function not assessed due to severity of mitral regurgitation.   Left atrial cavity is  moderately dilated. Moderate to severe mitral regurgitation. Moderate tricuspid regurgitation. Estimated pulmonary artery systolic pressure is 44 mmHg.  Small circumferential pericardial effusion. Compared to previous study on 03/21/2019, LVEF is significantly lower; Valvular regurgitation, pulmonary hypertension, and pericardial effusion are new.    Lexiscan nuclear stress test 03/22/2019: 1. No reversible ischemia or infarction. 2. Minimal septal and inferior hypokinesis. 3. Left ventricular ejection fraction 40% 4. Non invasive risk stratification*: Low   Recent labs: 01/13/2020: Glucose 108, BUN/Cr 15/1.47. EGFR 43  11/2019: HbA1C 5.7% Chol 178, TG 79, HDL 77, LDL 134  06/2019: TSH 2.3 normal    03/24/2019: Glucose 108, BUN/Cr 14/1.37. EGFR 48. Na/K 142/4.1.  H/H 9/27.4. MCV 85. Platelets 126   Review of Systems  Cardiovascular: Positive for orthopnea. Negative for chest pain, dyspnea on exertion, leg swelling, palpitations and syncope.        Vitals:   03/10/20 1022  BP: (!) 142/78  Pulse: 70  Resp: 17  Temp: 98.3 F (36.8 C)  SpO2: 97%     Objective:   Physical Exam  Constitutional: He appears well-developed and well-nourished.  Neck: No JVD present.  Cardiovascular: Normal rate, normal heart sounds and intact distal pulses. An irregular rhythm present.  No murmur heard. Pulmonary/Chest: Effort normal and breath sounds normal. He has no wheezes. He has no rales.  Musculoskeletal:        General: No edema.  Nursing note and vitals reviewed.       Assessment &  Recommendations:   86 year old African-American male with hypertension, CKD, tobacco abuse, history of prostatectomy, diverticular bleed in 03/2019, asymptomatic nonsustained ventricular tachycardia 74/0814, LV systolic dysfunction without clinical evidence of heart failure.   LV dysfunction: NYHA class II symptoms EF 30-35%. Moderate to severe MR, moderate TR. PASP 44 mmHg. Patient prefers  conservative management and would not like to pursue heart catheterization. Holter monitor does not suggest that this arrhythmia induced cardiomyopathy.  Continue losartan 50 mg daily, Bidil 20-37.5 mg to tid.  Not switching to [facility] due to tenuous renal function.  Reduce carvedilol to 6.25 mg bid given Mobitz type 1 AV block. If BP increases, consistently >140/90 mmHg, could add amlodipine 5 mg.   H/o NSVT: Currently absent.   Hypertension Well controlled. Enrolled in remote patient monitoring.  Hyperlipidemia: Not on statin due to age and cardiomyopathy.   F/u in 3 months.   [name], MD [facility] Pager: [phone] Office: [phone] If no answer Cell [phone]

## 2020-03-31 DIAGNOSIS — I1310 Hypertensive heart and chronic kidney disease without heart failure, with stage 1 through stage 4 chronic kidney disease, or unspecified chronic kidney disease: Secondary | ICD-10-CM | POA: Diagnosis not present

## 2020-03-31 DIAGNOSIS — I10 Essential (primary) hypertension: Secondary | ICD-10-CM | POA: Diagnosis not present

## 2020-03-31 DIAGNOSIS — R7303 Prediabetes: Secondary | ICD-10-CM | POA: Diagnosis not present

## 2020-03-31 DIAGNOSIS — E785 Hyperlipidemia, unspecified: Secondary | ICD-10-CM | POA: Diagnosis not present

## 2020-03-31 DIAGNOSIS — N1831 Chronic kidney disease, stage 3a: Secondary | ICD-10-CM | POA: Diagnosis not present

## 2020-04-28 DIAGNOSIS — I10 Essential (primary) hypertension: Secondary | ICD-10-CM | POA: Diagnosis not present

## 2020-05-01 DIAGNOSIS — E785 Hyperlipidemia, unspecified: Secondary | ICD-10-CM | POA: Diagnosis not present

## 2020-05-01 DIAGNOSIS — I1310 Hypertensive heart and chronic kidney disease without heart failure, with stage 1 through stage 4 chronic kidney disease, or unspecified chronic kidney disease: Secondary | ICD-10-CM | POA: Diagnosis not present

## 2020-05-01 DIAGNOSIS — N1831 Chronic kidney disease, stage 3a: Secondary | ICD-10-CM | POA: Diagnosis not present

## 2020-05-01 DIAGNOSIS — R7303 Prediabetes: Secondary | ICD-10-CM | POA: Diagnosis not present

## 2020-05-31 DIAGNOSIS — I10 Essential (primary) hypertension: Secondary | ICD-10-CM | POA: Diagnosis not present

## 2020-06-16 ENCOUNTER — Ambulatory Visit: Payer: Medicare Other | Admitting: Cardiology

## 2020-07-01 DIAGNOSIS — I10 Essential (primary) hypertension: Secondary | ICD-10-CM | POA: Diagnosis not present

## 2020-07-02 DIAGNOSIS — E785 Hyperlipidemia, unspecified: Secondary | ICD-10-CM | POA: Diagnosis not present

## 2020-07-02 DIAGNOSIS — R7303 Prediabetes: Secondary | ICD-10-CM | POA: Diagnosis not present

## 2020-07-02 DIAGNOSIS — N1831 Chronic kidney disease, stage 3a: Secondary | ICD-10-CM | POA: Diagnosis not present

## 2020-07-02 DIAGNOSIS — I1310 Hypertensive heart and chronic kidney disease without heart failure, with stage 1 through stage 4 chronic kidney disease, or unspecified chronic kidney disease: Secondary | ICD-10-CM | POA: Diagnosis not present

## 2020-07-02 NOTE — Progress Notes (Signed)
Follow up visit   Subjective:   Robert Hamilton, male    DOB: [birth year], 86 y.o.   MRN: [id number]   Chief Complaint  Patient presents with  . Cardiomyopathy  . Follow-up    3 month    HPI   86 year old African-American male with hypertension, CKD, tobacco abuse, history of prostatectomy, diverticular bleed in 03/2019, asymptomatic nonsustained ventricular tachycardia 00/9381, LV systolic dysfunction without clinical evidence of heart failure.   Patient denies chest pain, shortness of breath, palpitations, leg edema, orthopnea, PND, TIA/syncope. Blood pressure is elevated today, but home log shows well controlled numbers. Carvedilol not in his his medication bag today, but he states he is taking it.    Current Outpatient Medications on File Prior to Visit  Medication Sig Dispense Refill  . allopurinol (ZYLOPRIM) 300 MG tablet Take 100 mg by mouth daily.     Marland Kitchen BIDIL 20-37.5 MG tablet TAKE 1 TABLET BY MOUTH TWICE DAILY 829 tablet 0  . folic acid (FOLVITE) 1 MG tablet Take 1 mg by mouth daily.    . furosemide (LASIX) 20 MG tablet Take 1 tablet (20 mg total) by mouth as needed (Shortness of breath). 60 tablet 3  . losartan (COZAAR) 50 MG tablet Take 1 tablet (50 mg total) by mouth daily. 30 tablet 3  . pantoprazole (PROTONIX) 40 MG tablet Take 40 mg by mouth daily.    Marland Kitchen thiamine (VITAMIN B-1) 100 MG tablet Take 100 mg by mouth daily.    . carvedilol (COREG) 12.5 MG tablet Take 1 tablet (12.5 mg total) by mouth 2 (two) times daily. 180 tablet 1   No current facility-administered medications on file prior to visit.    Cardiovascular studies:  EKG 03/10/2020: Sinus rhythm 74 bpm Mobitz type 1 AV block. Left bundle branch block.  Occasional PVC.   Holter monitor 07/19/2019:  Dominant rhythm sinus. HR 45-102 bpm. Avg HR 69 bpm.  9.8% ventricular ectopy.  3.8% supraventricular ectopy.  No AFib/aflutter/>3sec pauses/high grade AV block/sustained VT.   Echocardiogram  07/08/2019: Left ventricle cavity is normal in size. Moderate concentric hypertrophy of the left ventricle. Moderate global hypokinesis. LVEF 30-35%. Diastolic function not assessed due to severity of mitral regurgitation.   Left atrial cavity is moderately dilated. Moderate to severe mitral regurgitation. Moderate tricuspid regurgitation. Estimated pulmonary artery systolic pressure is 44 mmHg.  Small circumferential pericardial effusion. Compared to previous study on 03/21/2019, LVEF is significantly lower; Valvular regurgitation, pulmonary hypertension, and pericardial effusion are new.    Lexiscan nuclear stress test 03/22/2019: 1. No reversible ischemia or infarction. 2. Minimal septal and inferior hypokinesis. 3. Left ventricular ejection fraction 40% 4. Non invasive risk stratification*: Low   Recent labs: 01/13/2020: Glucose 108, BUN/Cr 15/1.47. EGFR 43  11/2019: HbA1C 5.7% Chol 178, TG 79, HDL 77, LDL 134  06/2019: TSH 2.3 normal    03/24/2019: Glucose 108, BUN/Cr 14/1.37. EGFR 48. Na/K 142/4.1.  H/H 9/27.4. MCV 85. Platelets 126   Review of Systems  Cardiovascular: Positive for orthopnea. Negative for chest pain, dyspnea on exertion, leg swelling, palpitations and syncope.       Vitals:   07/03/20 0844 07/03/20 0848  BP: (!) 169/108 (!) 169/105  Pulse: 70   Resp: 16   SpO2: 100%      Objective:   Physical Exam Vitals and nursing note reviewed.  Constitutional:      Appearance: He is well-developed.  Neck:     Vascular: No JVD.  Cardiovascular:  Rate and Rhythm: Normal rate. Rhythm irregular.     Pulses: Intact distal pulses.     Heart sounds: Normal heart sounds. No murmur heard.   Pulmonary:     Effort: Pulmonary effort is normal.     Breath sounds: Normal breath sounds. No wheezing or rales.         Assessment & Recommendations:   86 year old African-American male with hypertension, CKD, tobacco abuse, history of prostatectomy,  diverticular bleed in 03/2019, asymptomatic nonsustained ventricular tachycardia 46/2863, LV systolic dysfunction without clinical evidence of heart failure.   LV dysfunction: NYHA class II symptoms EF 30-35%. Moderate to severe MR, moderate TR. PASP 44 mmHg. Patient prefers conservative management and would not like to pursue heart catheterization. Currently on losartan 50 mg daily, Bidil 20-37.5 mg tid, carvedilol to 6.25 mg bid  Not switching to Entresto due to tenuous renal function.   H/o NSVT: Currently absent.   Hypertension Well controlled. Enrolled in remote patient monitoring.  Hyperlipidemia: Not on statin due to age and cardiomyopathy.   F/u in 3 months.   [name], MD [facility] Pager: [phone] Office: [phone] If no answer Cell [phone]

## 2020-07-03 ENCOUNTER — Ambulatory Visit: Payer: Medicare Other | Admitting: Cardiology

## 2020-07-03 ENCOUNTER — Telehealth: Payer: Self-pay | Admitting: Pharmacist

## 2020-07-03 ENCOUNTER — Other Ambulatory Visit: Payer: Self-pay

## 2020-07-03 ENCOUNTER — Encounter: Payer: Self-pay | Admitting: Cardiology

## 2020-07-03 VITALS — BP 169/105 | HR 70 | Resp 16 | Ht 69.0 in | Wt 148.0 lb

## 2020-07-03 DIAGNOSIS — I472 Ventricular tachycardia: Secondary | ICD-10-CM | POA: Diagnosis not present

## 2020-07-03 DIAGNOSIS — I5022 Chronic systolic (congestive) heart failure: Secondary | ICD-10-CM

## 2020-07-03 DIAGNOSIS — I10 Essential (primary) hypertension: Secondary | ICD-10-CM

## 2020-07-03 DIAGNOSIS — I4729 Other ventricular tachycardia: Secondary | ICD-10-CM

## 2020-07-03 MED ORDER — CARVEDILOL 6.25 MG PO TABS: 6.2500 mg | ORAL_TABLET | Freq: Two times a day (BID) | ORAL | 2 refills | Status: DC

## 2020-07-03 NOTE — Telephone Encounter (Signed)
Average BP reading over the past month of 137/81 (159-105/98-57), HR ~68 (47-95). Med list reviewed and updated. Pt noted to be taking 1/2 tablet of 12.5 mg at home (current dose of 6.25 mg BID). Pt also attests to taking BiDil 20/37.5 mg only BID; will increase to TID as discussed previously at last OV. Encourage pt to continue with daily BP checks. Will continue to monitor and follow up closely.

## 2020-07-11 ENCOUNTER — Other Ambulatory Visit: Payer: Self-pay | Admitting: Cardiology

## 2020-07-11 DIAGNOSIS — I10 Essential (primary) hypertension: Secondary | ICD-10-CM

## 2020-07-11 DIAGNOSIS — I5022 Chronic systolic (congestive) heart failure: Secondary | ICD-10-CM

## 2020-07-18 DIAGNOSIS — E785 Hyperlipidemia, unspecified: Secondary | ICD-10-CM | POA: Diagnosis not present

## 2020-07-18 DIAGNOSIS — R7303 Prediabetes: Secondary | ICD-10-CM | POA: Diagnosis not present

## 2020-07-23 ENCOUNTER — Other Ambulatory Visit: Payer: Self-pay | Admitting: Cardiology

## 2020-07-23 DIAGNOSIS — I10 Essential (primary) hypertension: Secondary | ICD-10-CM

## 2020-07-23 DIAGNOSIS — I4729 Other ventricular tachycardia: Secondary | ICD-10-CM

## 2020-07-23 DIAGNOSIS — I472 Ventricular tachycardia: Secondary | ICD-10-CM

## 2020-07-24 DIAGNOSIS — M25511 Pain in right shoulder: Secondary | ICD-10-CM | POA: Diagnosis not present

## 2020-08-01 DIAGNOSIS — I10 Essential (primary) hypertension: Secondary | ICD-10-CM | POA: Diagnosis not present

## 2020-08-01 DIAGNOSIS — N1832 Chronic kidney disease, stage 3b: Secondary | ICD-10-CM | POA: Diagnosis not present

## 2020-08-01 DIAGNOSIS — R7303 Prediabetes: Secondary | ICD-10-CM | POA: Diagnosis not present

## 2020-08-01 DIAGNOSIS — I509 Heart failure, unspecified: Secondary | ICD-10-CM | POA: Diagnosis not present

## 2020-08-01 DIAGNOSIS — I1310 Hypertensive heart and chronic kidney disease without heart failure, with stage 1 through stage 4 chronic kidney disease, or unspecified chronic kidney disease: Secondary | ICD-10-CM | POA: Diagnosis not present

## 2020-08-31 DIAGNOSIS — I10 Essential (primary) hypertension: Secondary | ICD-10-CM | POA: Diagnosis not present

## 2020-09-01 DIAGNOSIS — N1832 Chronic kidney disease, stage 3b: Secondary | ICD-10-CM | POA: Diagnosis not present

## 2020-09-01 DIAGNOSIS — I509 Heart failure, unspecified: Secondary | ICD-10-CM | POA: Diagnosis not present

## 2020-09-01 DIAGNOSIS — R7303 Prediabetes: Secondary | ICD-10-CM | POA: Diagnosis not present

## 2020-09-01 DIAGNOSIS — I1310 Hypertensive heart and chronic kidney disease without heart failure, with stage 1 through stage 4 chronic kidney disease, or unspecified chronic kidney disease: Secondary | ICD-10-CM | POA: Diagnosis not present

## 2020-09-08 DIAGNOSIS — M19011 Primary osteoarthritis, right shoulder: Secondary | ICD-10-CM | POA: Diagnosis not present

## 2020-09-08 DIAGNOSIS — Z6822 Body mass index (BMI) 22.0-22.9, adult: Secondary | ICD-10-CM | POA: Diagnosis not present

## 2020-10-01 NOTE — Progress Notes (Signed)
No show

## 2020-10-02 DIAGNOSIS — R7303 Prediabetes: Secondary | ICD-10-CM | POA: Diagnosis not present

## 2020-10-02 DIAGNOSIS — I509 Heart failure, unspecified: Secondary | ICD-10-CM | POA: Diagnosis not present

## 2020-10-02 DIAGNOSIS — N1832 Chronic kidney disease, stage 3b: Secondary | ICD-10-CM | POA: Diagnosis not present

## 2020-10-02 DIAGNOSIS — I1310 Hypertensive heart and chronic kidney disease without heart failure, with stage 1 through stage 4 chronic kidney disease, or unspecified chronic kidney disease: Secondary | ICD-10-CM | POA: Diagnosis not present

## 2020-10-04 ENCOUNTER — Ambulatory Visit: Payer: Medicare Other | Admitting: Cardiology

## 2020-10-04 DIAGNOSIS — I472 Ventricular tachycardia: Secondary | ICD-10-CM

## 2020-10-04 DIAGNOSIS — I5022 Chronic systolic (congestive) heart failure: Secondary | ICD-10-CM

## 2020-10-04 DIAGNOSIS — I10 Essential (primary) hypertension: Secondary | ICD-10-CM

## 2020-10-18 NOTE — Progress Notes (Signed)
Follow up visit   Subjective:   Robert Hamilton, male    DOB: December 04, 1933, 86 y.o.   MRN: [id number]   Chief Complaint  Patient presents with  . HFrEF  . Hypertension  . Follow-up    3 month    HPI   86 year old African-American male with hypertension, CKD, tobacco abuse, history of prostatectomy, diverticular bleed in 03/2019, asymptomatic nonsustained ventricular tachycardia 87/5797, LV systolic dysfunction without clinical evidence of heart failure.   Patient denies chest pain, shortness of breath, palpitations, leg edema, orthopnea, PND, TIA/syncope. Blood pressure is elevated today, but home log shows well controlled numbers.   Current Outpatient Medications on File Prior to Visit  Medication Sig Dispense Refill  . allopurinol (ZYLOPRIM) 300 MG tablet Take 100 mg by mouth daily.     . carvedilol (COREG) 6.25 MG tablet Take 1 tablet (6.25 mg total) by mouth 2 (two) times daily. 282 tablet 2  . folic acid (FOLVITE) 1 MG tablet Take 1 mg by mouth daily.    . isosorbide-hydrALAZINE (BIDIL) 20-37.5 MG tablet Take 1 tablet by mouth 3 (three) times daily. 270 tablet 1  . losartan (COZAAR) 50 MG tablet Take 1 tablet (50 mg total) by mouth daily. 30 tablet 3  . pantoprazole (PROTONIX) 40 MG tablet Take 40 mg by mouth daily.    Marland Kitchen thiamine (VITAMIN B-1) 100 MG tablet Take 100 mg by mouth daily.    . furosemide (LASIX) 20 MG tablet Take 1 tablet (20 mg total) by mouth as needed (Shortness of breath). 60 tablet 3   No current facility-administered medications on file prior to visit.    Cardiovascular studies:  EKG 10/19/2020: Sinus rhythm 85 bpm First degree A-V block   Frequent pvcs IVCD  Holter monitor 07/19/2019:  Dominant rhythm sinus. HR 45-102 bpm. Avg HR 69 bpm.  9.8% ventricular ectopy.  3.8% supraventricular ectopy.  No AFib/aflutter/>3sec pauses/high grade AV block/sustained VT.   Echocardiogram 07/08/2019: Left ventricle cavity is normal in size. Moderate  concentric hypertrophy of the left ventricle. Moderate global hypokinesis. LVEF 30-35%. Diastolic function not assessed due to severity of mitral regurgitation.   Left atrial cavity is moderately dilated. Moderate to severe mitral regurgitation. Moderate tricuspid regurgitation. Estimated pulmonary artery systolic pressure is 44 mmHg.  Small circumferential pericardial effusion. Compared to previous study on 03/21/2019, LVEF is significantly lower; Valvular regurgitation, pulmonary hypertension, and pericardial effusion are new.    Lexiscan nuclear stress test 03/22/2019: 1. No reversible ischemia or infarction. 2. Minimal septal and inferior hypokinesis. 3. Left ventricular ejection fraction 40% 4. Non invasive risk stratification*: Low   Recent labs: 07/18/2020: Glucose 109, BUN/Cr 15/1.28. EGFR 51 HbA1C 6.2%  01/13/2020: Glucose 108, BUN/Cr 15/1.47. EGFR 43  11/2019: HbA1C 5.7% Chol 178, TG 79, HDL 77, LDL 134  06/2019: TSH 2.3 normal    03/24/2019: Glucose 108, BUN/Cr 14/1.37. EGFR 48. Na/K 142/4.1.  H/H 9/27.4. MCV 85. Platelets 126   Review of Systems  Cardiovascular: Positive for orthopnea. Negative for chest pain, dyspnea on exertion, leg swelling, palpitations and syncope.       Vitals:   10/19/20 1013  BP: (!) 157/89  Pulse: 69  Resp: 16  SpO2: 96%     Objective:   Physical Exam Vitals and nursing note reviewed.  Constitutional:      Appearance: He is well-developed.  Neck:     Vascular: No JVD.  Cardiovascular:     Rate and Rhythm: Normal rate. Rhythm irregular.  Pulses: Intact distal pulses.     Heart sounds: Normal heart sounds. No murmur heard.   Pulmonary:     Effort: Pulmonary effort is normal.     Breath sounds: Normal breath sounds. No wheezing or rales.         Assessment & Recommendations:   86 year old African-American male with hypertension, CKD, tobacco abuse, history of prostatectomy, diverticular bleed in 03/2019,  asymptomatic nonsustained ventricular tachycardia 19/3790, LV systolic dysfunction without clinical evidence of heart failure.   HFrEF: EF 30-35%. Moderate to severe MR, moderate TR. PASP 44 mmHg. Patient prefers conservative management and would not like to pursue heart catheterization. Currently on losartan 50 mg daily, Bidil 20-37.5 mg tid, carvedilol to 6.25 mg bid  Not switching to Entresto due to tenuous renal function.   H/o NSVT: Currently absent.   Hypertension Fairly well controlled. Enrolled in remote patient monitoring.  Hyperlipidemia: Not on statin due to age and cardiomyopathy.   F/u in 6 months.   Robert Mormon, MD [facility] Pager: [phone] Office: [phone] If no answer Cell [phone]

## 2020-10-19 ENCOUNTER — Other Ambulatory Visit: Payer: Self-pay

## 2020-10-19 ENCOUNTER — Ambulatory Visit: Payer: Medicare Other | Admitting: Cardiology

## 2020-10-19 ENCOUNTER — Encounter: Payer: Self-pay | Admitting: Cardiology

## 2020-10-19 VITALS — BP 157/89 | HR 69 | Resp 16 | Ht 69.0 in | Wt 148.8 lb

## 2020-10-19 DIAGNOSIS — I5022 Chronic systolic (congestive) heart failure: Secondary | ICD-10-CM

## 2020-10-19 DIAGNOSIS — I10 Essential (primary) hypertension: Secondary | ICD-10-CM

## 2020-10-19 DIAGNOSIS — I472 Ventricular tachycardia: Secondary | ICD-10-CM

## 2020-10-19 DIAGNOSIS — I4729 Other ventricular tachycardia: Secondary | ICD-10-CM

## 2020-10-19 DIAGNOSIS — I340 Nonrheumatic mitral (valve) insufficiency: Secondary | ICD-10-CM | POA: Diagnosis not present

## 2020-10-31 DIAGNOSIS — I10 Essential (primary) hypertension: Secondary | ICD-10-CM | POA: Diagnosis not present

## 2020-11-01 DIAGNOSIS — N1832 Chronic kidney disease, stage 3b: Secondary | ICD-10-CM | POA: Diagnosis not present

## 2020-11-01 DIAGNOSIS — I1310 Hypertensive heart and chronic kidney disease without heart failure, with stage 1 through stage 4 chronic kidney disease, or unspecified chronic kidney disease: Secondary | ICD-10-CM | POA: Diagnosis not present

## 2020-11-01 DIAGNOSIS — I509 Heart failure, unspecified: Secondary | ICD-10-CM | POA: Diagnosis not present

## 2020-11-01 DIAGNOSIS — R7303 Prediabetes: Secondary | ICD-10-CM | POA: Diagnosis not present

## 2021-01-02 DIAGNOSIS — I1310 Hypertensive heart and chronic kidney disease without heart failure, with stage 1 through stage 4 chronic kidney disease, or unspecified chronic kidney disease: Secondary | ICD-10-CM | POA: Diagnosis not present

## 2021-01-02 DIAGNOSIS — I509 Heart failure, unspecified: Secondary | ICD-10-CM | POA: Diagnosis not present

## 2021-01-02 DIAGNOSIS — N1831 Chronic kidney disease, stage 3a: Secondary | ICD-10-CM | POA: Diagnosis not present

## 2021-01-02 DIAGNOSIS — R7303 Prediabetes: Secondary | ICD-10-CM | POA: Diagnosis not present

## 2021-01-30 DIAGNOSIS — I509 Heart failure, unspecified: Secondary | ICD-10-CM | POA: Diagnosis not present

## 2021-01-30 DIAGNOSIS — N1831 Chronic kidney disease, stage 3a: Secondary | ICD-10-CM | POA: Diagnosis not present

## 2021-01-30 DIAGNOSIS — R7303 Prediabetes: Secondary | ICD-10-CM | POA: Diagnosis not present

## 2021-01-30 DIAGNOSIS — I1310 Hypertensive heart and chronic kidney disease without heart failure, with stage 1 through stage 4 chronic kidney disease, or unspecified chronic kidney disease: Secondary | ICD-10-CM | POA: Diagnosis not present

## 2021-02-01 DIAGNOSIS — I10 Essential (primary) hypertension: Secondary | ICD-10-CM | POA: Diagnosis not present

## 2021-03-02 DIAGNOSIS — M109 Gout, unspecified: Secondary | ICD-10-CM | POA: Diagnosis not present

## 2021-03-02 DIAGNOSIS — I509 Heart failure, unspecified: Secondary | ICD-10-CM | POA: Diagnosis not present

## 2021-03-02 DIAGNOSIS — R7303 Prediabetes: Secondary | ICD-10-CM | POA: Diagnosis not present

## 2021-03-02 DIAGNOSIS — I129 Hypertensive chronic kidney disease with stage 1 through stage 4 chronic kidney disease, or unspecified chronic kidney disease: Secondary | ICD-10-CM | POA: Diagnosis not present

## 2021-03-03 DIAGNOSIS — I10 Essential (primary) hypertension: Secondary | ICD-10-CM | POA: Diagnosis not present

## 2021-04-01 DIAGNOSIS — I129 Hypertensive chronic kidney disease with stage 1 through stage 4 chronic kidney disease, or unspecified chronic kidney disease: Secondary | ICD-10-CM | POA: Diagnosis not present

## 2021-04-01 DIAGNOSIS — I509 Heart failure, unspecified: Secondary | ICD-10-CM | POA: Diagnosis not present

## 2021-04-01 DIAGNOSIS — M109 Gout, unspecified: Secondary | ICD-10-CM | POA: Diagnosis not present

## 2021-04-01 DIAGNOSIS — R7303 Prediabetes: Secondary | ICD-10-CM | POA: Diagnosis not present

## 2021-04-02 DIAGNOSIS — I10 Essential (primary) hypertension: Secondary | ICD-10-CM | POA: Diagnosis not present

## 2021-04-03 DIAGNOSIS — E785 Hyperlipidemia, unspecified: Secondary | ICD-10-CM | POA: Diagnosis not present

## 2021-04-03 DIAGNOSIS — R7303 Prediabetes: Secondary | ICD-10-CM | POA: Diagnosis not present

## 2021-04-09 DIAGNOSIS — N183 Chronic kidney disease, stage 3 unspecified: Secondary | ICD-10-CM | POA: Diagnosis not present

## 2021-04-09 DIAGNOSIS — I1310 Hypertensive heart and chronic kidney disease without heart failure, with stage 1 through stage 4 chronic kidney disease, or unspecified chronic kidney disease: Secondary | ICD-10-CM | POA: Diagnosis not present

## 2021-04-09 DIAGNOSIS — R7303 Prediabetes: Secondary | ICD-10-CM | POA: Diagnosis not present

## 2021-04-09 DIAGNOSIS — I5020 Unspecified systolic (congestive) heart failure: Secondary | ICD-10-CM | POA: Diagnosis not present

## 2021-04-17 NOTE — Progress Notes (Signed)
Follow up visit   Subjective:   Robert Hamilton, male    DOB: 05-10-1934, 87 y.o.   MRN: [id number]   Chief Complaint  Patient presents with  . Congestive Heart Failure  . Hypertension  . Follow-up  . Shortness of Breath    HPI   87 year old African-American male with hypertension, CKD, tobacco abuse, history of prostatectomy, diverticular bleed in 03/2019, asymptomatic nonsustained ventricular tachycardia 53/9767, LV systolic dysfunction without clinical evidence of heart failure.   Patient has recently noticed exertional dyspnea symptoms.  He denies any orthopnea, PND, leg edema.  Reports frequent urination after taking Lasix.  He reportedly underwent lab work through his PCP last week.  Results not available to me.  Current Outpatient Medications on File Prior to Visit  Medication Sig Dispense Refill  . allopurinol (ZYLOPRIM) 300 MG tablet Take 100 mg by mouth daily.     . carvedilol (COREG) 6.25 MG tablet Take 1 tablet (6.25 mg total) by mouth 2 (two) times daily. 341 tablet 2  . folic acid (FOLVITE) 1 MG tablet Take 1 mg by mouth daily.    . furosemide (LASIX) 20 MG tablet Take 1 tablet (20 mg total) by mouth as needed (Shortness of breath). 60 tablet 3  . isosorbide-hydrALAZINE (BIDIL) 20-37.5 MG tablet Take 1 tablet by mouth 3 (three) times daily. 270 tablet 1  . losartan (COZAAR) 50 MG tablet Take 1 tablet (50 mg total) by mouth daily. 30 tablet 3  . pantoprazole (PROTONIX) 40 MG tablet Take 40 mg by mouth daily.    Marland Kitchen thiamine (VITAMIN B-1) 100 MG tablet Take 100 mg by mouth daily.     No current facility-administered medications on file prior to visit.    Cardiovascular studies:  EKG 04/18/2021: Sinus rhythm 83 bpm Left bundle branch block Occasional PAC     Holter monitor 07/19/2019:  Dominant rhythm sinus. HR 45-102 bpm. Avg HR 69 bpm.  9.8% ventricular ectopy.  3.8% supraventricular ectopy.  No AFib/aflutter/>3sec pauses/high grade AV block/sustained VT.    Echocardiogram 07/08/2019: Left ventricle cavity is normal in size. Moderate concentric hypertrophy of the left ventricle. Moderate global hypokinesis. LVEF 30-35%. Diastolic function not assessed due to severity of mitral regurgitation.   Left atrial cavity is moderately dilated. Moderate to severe mitral regurgitation. Moderate tricuspid regurgitation. Estimated pulmonary artery systolic pressure is 44 mmHg.  Small circumferential pericardial effusion. Compared to previous study on 03/21/2019, LVEF is significantly lower; Valvular regurgitation, pulmonary hypertension, and pericardial effusion are new.    Lexiscan nuclear stress test 03/22/2019: 1. No reversible ischemia or infarction. 2. Minimal septal and inferior hypokinesis. 3. Left ventricular ejection fraction 40% 4. Non invasive risk stratification*: Low   Recent labs: 07/18/2020: Glucose 109, BUN/Cr 15/1.28. EGFR 51 HbA1C 6.2%  01/13/2020: Glucose 108, BUN/Cr 15/1.47. EGFR 43  11/2019: HbA1C 5.7% Chol 178, TG 79, HDL 77, LDL 134  06/2019: TSH 2.3 normal    03/24/2019: Glucose 108, BUN/Cr 14/1.37. EGFR 48. Na/K 142/4.1.  H/H 9/27.4. MCV 85. Platelets 126   Review of Systems  Cardiovascular: Positive for dyspnea on exertion. Negative for chest pain, leg swelling, orthopnea, palpitations and syncope.       Vitals:   04/18/21 1026 04/18/21 1028  BP: (!) 163/82 (!) 162/90  Pulse: 83 82  Temp: (!) 83 F (28.3 C)   SpO2: 97%      Objective:   Physical Exam Vitals and nursing note reviewed.  Constitutional:      Appearance: He is well-developed.  Neck:     Vascular: No JVD.  Cardiovascular:     Rate and Rhythm: Normal rate. Rhythm irregular.     Pulses: Intact distal pulses.     Heart sounds: Murmur heard.  High-pitched blowing holosystolic murmur is present with a grade of 3/6 at the apex.   Pulmonary:     Effort: Pulmonary effort is normal.     Breath sounds: Normal breath sounds. No wheezing or  rales.         Assessment & Recommendations:   87 year old African-American male with hypertension, CKD, tobacco abuse, history of prostatectomy, diverticular bleed in 03/2019, asymptomatic nonsustained ventricular tachycardia 64/2903, LV systolic dysfunction without clinical evidence of heart failure.   HFrEF: EF 30-35%. Moderate to severe MR, moderate TR. PASP 44 mmHg. Patient prefers conservative management and would not like to pursue heart catheterization.  Currently on losartan 50 mg daily, Bidil 20-37.5 mg tid, carvedilol to 6.25 mg bid  Thus far, have not switched to Entresto due to tenuous renal function.  Given his worsening symptoms of exertional dyspnea, I will repeat echocardiogram and obtain lab work from PCP.  If renal function would allow, would like to switch him to [facility] and add spironolactone. He still does not want any invasive work-up or management at this time.  H/o NSVT: Currently absent.   Hypertension Blood pressure elevated in office today, but well controlled at home.  No change made today.  Hyperlipidemia: Not on statin due to age and cardiomyopathy.   F/u in 2-3 weeks  [name], MD [facility] [place] Pager: [phone] Office: [phone] If no answer Cell [phone]

## 2021-04-18 ENCOUNTER — Other Ambulatory Visit: Payer: Self-pay

## 2021-04-18 ENCOUNTER — Ambulatory Visit: Payer: Medicare Other | Admitting: Cardiology

## 2021-04-18 ENCOUNTER — Encounter: Payer: Self-pay | Admitting: Cardiology

## 2021-04-18 VITALS — BP 162/90 | HR 82 | Temp 83.0°F | Ht 69.0 in | Wt 138.0 lb

## 2021-04-18 DIAGNOSIS — I340 Nonrheumatic mitral (valve) insufficiency: Secondary | ICD-10-CM

## 2021-04-18 DIAGNOSIS — I5022 Chronic systolic (congestive) heart failure: Secondary | ICD-10-CM | POA: Diagnosis not present

## 2021-04-18 DIAGNOSIS — I10 Essential (primary) hypertension: Secondary | ICD-10-CM | POA: Diagnosis not present

## 2021-04-21 DIAGNOSIS — K5730 Diverticulosis of large intestine without perforation or abscess without bleeding: Secondary | ICD-10-CM | POA: Diagnosis not present

## 2021-04-21 DIAGNOSIS — M533 Sacrococcygeal disorders, not elsewhere classified: Secondary | ICD-10-CM | POA: Diagnosis not present

## 2021-04-21 DIAGNOSIS — M47816 Spondylosis without myelopathy or radiculopathy, lumbar region: Secondary | ICD-10-CM | POA: Diagnosis not present

## 2021-04-21 DIAGNOSIS — K8689 Other specified diseases of pancreas: Secondary | ICD-10-CM | POA: Diagnosis not present

## 2021-04-21 DIAGNOSIS — J9811 Atelectasis: Secondary | ICD-10-CM | POA: Diagnosis not present

## 2021-04-21 DIAGNOSIS — D492 Neoplasm of unspecified behavior of bone, soft tissue, and skin: Secondary | ICD-10-CM | POA: Diagnosis not present

## 2021-04-21 DIAGNOSIS — M5137 Other intervertebral disc degeneration, lumbosacral region: Secondary | ICD-10-CM | POA: Diagnosis not present

## 2021-04-21 DIAGNOSIS — I708 Atherosclerosis of other arteries: Secondary | ICD-10-CM | POA: Diagnosis not present

## 2021-04-21 DIAGNOSIS — R102 Pelvic and perineal pain: Secondary | ICD-10-CM | POA: Diagnosis not present

## 2021-04-21 DIAGNOSIS — R590 Localized enlarged lymph nodes: Secondary | ICD-10-CM | POA: Diagnosis not present

## 2021-04-21 DIAGNOSIS — Z043 Encounter for examination and observation following other accident: Secondary | ICD-10-CM | POA: Diagnosis not present

## 2021-04-21 DIAGNOSIS — K6389 Other specified diseases of intestine: Secondary | ICD-10-CM | POA: Diagnosis not present

## 2021-04-21 DIAGNOSIS — I2510 Atherosclerotic heart disease of native coronary artery without angina pectoris: Secondary | ICD-10-CM | POA: Diagnosis not present

## 2021-04-26 ENCOUNTER — Ambulatory Visit: Payer: Medicare Other

## 2021-04-26 ENCOUNTER — Other Ambulatory Visit: Payer: Self-pay

## 2021-04-26 DIAGNOSIS — I340 Nonrheumatic mitral (valve) insufficiency: Secondary | ICD-10-CM | POA: Diagnosis not present

## 2021-04-26 DIAGNOSIS — I5022 Chronic systolic (congestive) heart failure: Secondary | ICD-10-CM

## 2021-05-01 DIAGNOSIS — C7989 Secondary malignant neoplasm of other specified sites: Secondary | ICD-10-CM | POA: Diagnosis not present

## 2021-05-01 DIAGNOSIS — C801 Malignant (primary) neoplasm, unspecified: Secondary | ICD-10-CM | POA: Diagnosis not present

## 2021-05-02 DIAGNOSIS — I509 Heart failure, unspecified: Secondary | ICD-10-CM | POA: Diagnosis not present

## 2021-05-02 DIAGNOSIS — I10 Essential (primary) hypertension: Secondary | ICD-10-CM | POA: Diagnosis not present

## 2021-05-02 DIAGNOSIS — M109 Gout, unspecified: Secondary | ICD-10-CM | POA: Diagnosis not present

## 2021-05-02 DIAGNOSIS — I129 Hypertensive chronic kidney disease with stage 1 through stage 4 chronic kidney disease, or unspecified chronic kidney disease: Secondary | ICD-10-CM | POA: Diagnosis not present

## 2021-05-04 DIAGNOSIS — M898X5 Other specified disorders of bone, thigh: Secondary | ICD-10-CM | POA: Diagnosis not present

## 2021-05-04 DIAGNOSIS — Z6820 Body mass index (BMI) 20.0-20.9, adult: Secondary | ICD-10-CM | POA: Diagnosis not present

## 2021-05-04 DIAGNOSIS — R918 Other nonspecific abnormal finding of lung field: Secondary | ICD-10-CM | POA: Diagnosis not present

## 2021-05-04 DIAGNOSIS — M25551 Pain in right hip: Secondary | ICD-10-CM | POA: Diagnosis not present

## 2021-05-07 ENCOUNTER — Ambulatory Visit: Payer: Medicare Other | Admitting: Cardiology

## 2021-05-08 ENCOUNTER — Encounter: Payer: Self-pay | Admitting: Pharmacist

## 2021-05-08 DIAGNOSIS — Z87891 Personal history of nicotine dependence: Secondary | ICD-10-CM | POA: Diagnosis not present

## 2021-05-08 DIAGNOSIS — R918 Other nonspecific abnormal finding of lung field: Secondary | ICD-10-CM | POA: Diagnosis not present

## 2021-05-08 DIAGNOSIS — J449 Chronic obstructive pulmonary disease, unspecified: Secondary | ICD-10-CM | POA: Diagnosis not present

## 2021-05-09 ENCOUNTER — Telehealth: Payer: Self-pay | Admitting: Hematology and Oncology

## 2021-05-09 NOTE — Telephone Encounter (Signed)
Patient referred by Dr Camillia Herter for Abnormal CT Pelvis.  Appt made for 05/11/21 Labs 2:00 pm - Consult 2:30 pm

## 2021-05-09 NOTE — Telephone Encounter (Signed)
Patient's daughter rescheduled Appt from 6/10 to 6/13 Labs 10:30 am - Consult 11:00 am

## 2021-05-11 ENCOUNTER — Ambulatory Visit: Payer: Medicare Other | Admitting: Hematology and Oncology

## 2021-05-11 ENCOUNTER — Other Ambulatory Visit: Payer: Medicare Other

## 2021-05-11 NOTE — Progress Notes (Signed)
[facility]  [address] [place],  [place]  [id number] [phone]  Clinic Day:  05/14/2021  Referring physician: Maryella Shivers, MD   CHIEF COMPLAINT:  CC: An 87 year old male with new findings of left hilar adenopathy with peripheral nodules and lytic lesion just above right acetabulum concerning for malignancy  Current Treatment:  Diagnostic   HISTORY OF PRESENT ILLNESS:  Robert Hamilton is a 87 y.o. male with a history of a recent fall which led to an ED visit. From there, CT imaging was obtained which revealed LEFT hilar adenopathy with small peripheral nodules, concerning for primary pulmonary malignancy such would consider small cell cancer given this appearance; lytic lesion in the RIGHT iliac bone just above the RIGHT acetabulum wit disruption of medial cortex of the iliac bone. As before no gross pathologic fracture though there is cortical disruption with risk for pathologic fracture. LEFT iliac lesion quite subtle seen also on the current study.   His medical history is significant for prostate cancer for which he had prostatectomy with Dr. Comer Locket; anemia; CHF; hypertension; renal disease, kidney stones, GERD, diverticulosis, arthritis, blindness in the right eye, and gout. Surgical history is significant for right inguinal hernia repair in 2011 and TURP in 2014. His family history consists of a sister with breast cancer. He is a current every day smoker.   Today, he reports some shortness of breath upon exertion. He denies fever, chills, nausea or vomiting. He denies issue with bowel or bladder. He denies cough or chest pain.  REVIEW OF SYSTEMS:  Review of Systems  Constitutional:  Negative for appetite change, chills, diaphoresis, fatigue, fever and unexpected weight change.  HENT:   Negative for hearing loss, lump/mass, mouth sores, nosebleeds, sore throat, tinnitus, trouble swallowing and voice change.   Eyes:  Negative for eye  problems and icterus.  Respiratory:  Positive for shortness of breath. Negative for chest tightness, cough, hemoptysis and wheezing.   Cardiovascular:  Negative for chest pain, leg swelling and palpitations.  Gastrointestinal:  Negative for abdominal distention, abdominal pain, blood in stool, constipation, diarrhea, nausea, rectal pain and vomiting.  Endocrine: Negative for hot flashes.  Genitourinary:  Negative for bladder incontinence, difficulty urinating, dyspareunia, dysuria, frequency, hematuria and nocturia.   Musculoskeletal:  Positive for arthralgias and gait problem. Negative for back pain, flank pain, myalgias, neck pain and neck stiffness.  Skin:  Negative for itching, rash and wound.  Neurological:  Positive for gait problem. Negative for dizziness, extremity weakness, headaches, light-headedness, numbness, seizures and speech difficulty.  Hematological:  Negative for adenopathy. Does not bruise/bleed easily.  Psychiatric/Behavioral:  Negative for confusion, decreased concentration, depression, sleep disturbance and suicidal ideas. The patient is not nervous/anxious.     VITALS:  Blood pressure 133/86, pulse 82, temperature 98.2 F (36.8 C), temperature source Oral, resp. rate 18, height 5\' 9"  (1.753 m), weight 138 lb 6.4 oz (62.8 kg), SpO2 97 %.  Wt Readings from Last 3 Encounters:  05/14/21 138 lb 6.4 oz (62.8 kg)  04/18/21 138 lb (62.6 kg)  10/19/20 148 lb 12.8 oz (67.5 kg)    Body mass index is 20.44 kg/m.  Performance status (ECOG): 1 - Symptomatic but completely ambulatory  PHYSICAL EXAM:  Physical Exam Constitutional:      General: He is not in acute distress.    Appearance: Normal appearance. He is normal weight. He is not ill-appearing, toxic-appearing or diaphoretic.  HENT:     Head: Normocephalic and atraumatic.  Nose: Nose normal. No congestion or rhinorrhea.     Mouth/Throat:     Mouth: Mucous membranes are moist.     Pharynx: Oropharynx is clear. No  oropharyngeal exudate or posterior oropharyngeal erythema.  Eyes:     General: No scleral icterus.       Right eye: No discharge.        Left eye: No discharge.     Extraocular Movements: Extraocular movements intact.     Conjunctiva/sclera: Conjunctivae normal.     Pupils: Pupils are equal, round, and reactive to light.  Neck:     Vascular: No carotid bruit.  Cardiovascular:     Rate and Rhythm: Normal rate and regular rhythm.     Heart sounds: No murmur heard.   No friction rub. No gallop.  Pulmonary:     Effort: Pulmonary effort is normal. No respiratory distress.     Breath sounds: Normal breath sounds. No stridor. No wheezing, rhonchi or rales.  Chest:     Chest wall: No tenderness.  Abdominal:     General: Abdomen is flat. Bowel sounds are normal. There is no distension.     Palpations: There is no mass.     Tenderness: There is no abdominal tenderness. There is no right CVA tenderness, left CVA tenderness, guarding or rebound.     Hernia: No hernia is present.  Musculoskeletal:        General: No swelling, tenderness, deformity or signs of injury. Normal range of motion.     Cervical back: Normal range of motion and neck supple. No rigidity or tenderness.     Right lower leg: No edema.     Left lower leg: No edema.  Lymphadenopathy:     Cervical: No cervical adenopathy.  Skin:    General: Skin is warm and dry.     Capillary Refill: Capillary refill takes less than 2 seconds.     Coloration: Skin is not jaundiced or pale.     Findings: No bruising, erythema, lesion or rash.  Neurological:     General: No focal deficit present.     Mental Status: He is alert and oriented to person, place, and time. Mental status is at baseline.     Cranial Nerves: No cranial nerve deficit.     Sensory: No sensory deficit.     Motor: No weakness.     Coordination: Coordination normal.     Gait: Gait normal.     Deep Tendon Reflexes: Reflexes normal.  Psychiatric:        Mood and  Affect: Mood normal.        Behavior: Behavior normal.        Thought Content: Thought content normal.        Judgment: Judgment normal.   Lymph nodes:   There is no cervical, clavicular, axillary or lymphadenopathy.  LABS:   CBC Latest Ref Rng & Units 03/23/2019 03/22/2019 03/21/2019  WBC 4.0 - 10.5 K/uL 6.6 5.7 -  Hemoglobin 13.0 - 17.0 g/dL 9.0(L) 8.9(L) 8.8(L)  Hematocrit 39.0 - 52.0 % 27.4(L) 28.0(L) 28.2(L)  Platelets 150 - 400 K/uL 126(L) 123(L) -   CMP Latest Ref Rng & Units 03/24/2019 03/22/2019 03/21/2019  Glucose 70 - 99 mg/dL 108(H) 90 98  BUN 8 - 23 mg/dL 14 12 15   Creatinine 0.61 - 1.24 mg/dL 1.37(H) 1.20 1.23  Sodium 135 - 145 mmol/L 142 140 140  Potassium 3.5 - 5.1 mmol/L 4.1 3.7 4.0  Chloride 98 - 111 mmol/L  111 111 111  CO2 22 - 32 mmol/L 22 20(L) 20(L)  Calcium 8.9 - 10.3 mg/dL 9.1 8.7(L) 8.3(L)  Total Protein 6.5 - 8.1 g/dL - - -  Total Bilirubin 0.3 - 1.2 mg/dL - - -  Alkaline Phos 38 - 126 U/L - - -  AST 15 - 41 U/L - - -  ALT 0 - 44 U/L - - -     No results found for: CEA1 / No results found for: CEA1 No results found for: PSA1 No results found for: GEX528 No results found for: UXL244  No results found for: TOTALPROTELP, ALBUMINELP, A1GS, A2GS, BETS, BETA2SER, GAMS, MSPIKE, SPEI No results found for: TIBC, FERRITIN, IRONPCTSAT No results found for: LDH  STUDIES:  PCV ECHOCARDIOGRAM COMPLETE  Result Date: 04/29/2021 Echocardiogram 04/26/2021: Moderately depressed LV systolic function with visual EF 35-40%. Left ventricle cavity is normal in size. Hypokinetic global wall motion. Doppler evidence of grade II (pseudonormal) diastolic dysfunction, normal LAP. Moderate left ventricular hypertrophy. Left atrial cavity is severely dilated. Trace aortic regurgitation. Moderate to severe mitral regurgitation. Moderate to severe tricuspid regurgitation. Mild pulmonary hypertension. RVSP measures 41 mmHg. Compared to study dated 07/08/2019: LVEF improved from 30-35%  to 35-40%, LAE is now severe, otherwise no significant change.     Exam(s): [id number] CT/CT CHEST-ABD-PELV W/IV CM CLINICAL DATA:  Fall prior to arrival. [place] with cane at baseline. Pop in hip and groin. Found to have lytic lesion on recent pelvic CT.  EXAM: CT CHEST, ABDOMEN, AND PELVIS WITH CONTRAST  TECHNIQUE: Multidetector CT imaging of the chest, abdomen and pelvis was performed following the standard protocol during bolus administration of intravenous contrast.  CONTRAST:  100 mL Isovue 370  COMPARISON:  CT of the pelvis acquired on Apr 21, 2021.  FINDINGS: CT CHEST FINDINGS  Cardiovascular: Calcified and noncalcified atheromatous plaque in the thoracic aorta. No aneurysmal dilation. Heart size is normal without pericardial effusion. Central pulmonary vasculature is normal caliber.  Mediastinum/Nodes: LEFT hilar adenopathy (image 28/3) 2.7 x 3.9 cm.  (Image 24/3) 3.4 x 2.3 cm for LEFT suprahilar nodal tissue. No thoracic inlet lymphadenopathy. No axillary lymphadenopathy. No mediastinal adenopathy. No RIGHT hilar adenopathy.  Lungs/Pleura: Signs of pulmonary emphysema. Small nodular areas peripheral to the LEFT hilum, for example, (image 55/4) 13 mm nodule. Additionally, (image 51/4) 8 mm nodule. No effusion. Pulmonary emphysema. Basilar atelectasis. Small amount of material in the RIGHT mainstem bronchus likely either aspirated or inspissated secretions.  Musculoskeletal: See below for full musculoskeletal details.  CT ABDOMEN PELVIS FINDINGS  Hepatobiliary: No suspicious focal hepatic lesion. The gallbladder is normal. No biliary duct dilation.  Pancreas: Mild pancreatic ductal dilation tapering towards distal/peripheral pancreas. No visible lesion.  Spleen: Spleen normal size and contour.  No focal lesion.  Adrenals/Urinary Tract: Adrenal glands are normal.  Renal cortical scarring bilaterally. No hydronephrosis. No perinephric stranding. Post  prostatectomy. Urinary bladder unremarkable reflecting changes related to prostatectomy descending into the pelvis.  Stomach/Bowel: No acute gastrointestinal process. The appendix is normal. Colonic diverticulosis.  Vascular/Lymphatic: Patent abdominal vessels. No aortic dilation. Calcified and noncalcified atheromatous plaque of the abdominal aorta. Smooth contour of the IVC. There is no gastrohepatic or hepatoduodenal ligament lymphadenopathy. No retroperitoneal or mesenteric lymphadenopathy.  No pelvic sidewall lymphadenopathy.  Reproductive: Post prostatectomy.  Other: LEFT inguinal herniorrhaphy.  Musculoskeletal: Visualized clavicles and scapulae are intact. Sternum is intact. No displaced rib fractures. No costochondral abnormality.  Spinal degenerative changes.  Lytic lesion in the RIGHT iliac bone just above the RIGHT  acetabulum with disruption of medial cortex of the iliac bone. As before no gross pathologic fracture though there is cortical disruption with risk for pathologic fracture. Degenerative changes are present in the hips.  Subtle lesion in the medial LEFT iliac bone best demonstrated on recent pelvic CT.  IMPRESSION: 1. LEFT hilar adenopathy with small peripheral nodules, concerning for primary pulmonary malignancy such would consider small cell lung cancer given this appearance. 2. Lytic lesion in the RIGHT iliac bone just above the RIGHT acetabulum with disruption of medial cortex of the iliac bone. As before no gross pathologic fracture though there is cortical disruption with risk for pathologic fracture. LEFT iliac lesion quite subtle seen also on the current study. 3. Post prostatectomy would consider PSA correlation for completeness though findings are more suggestive of metastatic pulmonary neoplasm. 4. Aortic atherosclerosis.  Aortic Atherosclerosis (ICD10-I70.0).   Electronically Signed   By: Zetta Bills M.D.   On: 04/21/2021  12:11  Electronically Signed By: Felipa Emory MD  Electronically Signed Date/Time: 05/21/221213 Dictate Date/Time: 04/21/21 1156  Technologist: Ellison,Sherrie L Transcribed By: Odie Sera Transcribed Date/Time: 04/21/21 1211    HISTORY:   Past Medical History:  Diagnosis Date   Anemia    Cancer (HCC)    CHF (congestive heart failure) ([place])    Chronic kidney disease    COPD (chronic obstructive pulmonary disease) ([place])    Diverticulosis    GERD (gastroesophageal reflux disease)    Hypertension     Past Surgical History:  Procedure Laterality Date   HERNIA REPAIR     PROSTATE SURGERY     PROSTATECTOMY      Family History  Problem Relation Age of Onset   Heart attack Mother    Hypertension Mother    Hypertension Father    Hypertension Sister    Hypertension Sister    Hypertension Sister    Hypertension Sister     Social History:  reports that he has been smoking cigarettes. He has been smoking an average of 0.25 packs per day. He has never used smokeless tobacco. He reports previous alcohol use. He reports that he does not use drugs.The patient is accompanied by daughter today.  Allergies: No Known Allergies  Current Medications: Current Outpatient Medications  Medication Sig Dispense Refill   Fluticasone-Umeclidin-Vilant (TRELEGY ELLIPTA) 100-62.5-25 MCG/INH AEPB Inhale into the lungs daily.     allopurinol (ZYLOPRIM) 100 MG tablet Take 100 mg by mouth daily.     carvedilol (COREG) 6.25 MG tablet Take 1 tablet (6.25 mg total) by mouth 2 (two) times daily. 384 tablet 2   folic acid (FOLVITE) 1 MG tablet Take 1 mg by mouth daily.     furosemide (LASIX) 20 MG tablet Take 1 tablet (20 mg total) by mouth as needed (Shortness of breath). 60 tablet 3   isosorbide-hydrALAZINE (BIDIL) 20-37.5 MG tablet Take 1 tablet by mouth 3 (three) times daily. 270 tablet 1   losartan (COZAAR) 50 MG tablet Take 1 tablet (50 mg total) by mouth daily. 30 tablet 3   pantoprazole  (PROTONIX) 40 MG tablet Take 40 mg by mouth daily.     thiamine (VITAMIN B-1) 100 MG tablet Take 100 mg by mouth daily.     traMADol (ULTRAM) 50 MG tablet Take 50 mg by mouth 3 (three) times daily as needed.     No current facility-administered medications for this visit.     ASSESSMENT & PLAN:   Assessment:  Robert Hamilton is a 87 y.o. male with hilar  adenopathy and peripheral pulmonary nodules as well as lytic lesions found on CT scan after patient suffered a fall. He reports no symptoms leading up to the fall. Dr. Alcide Clever was consulted from Pulmonology while the patient was in the ED and has the patient scheduled for PET imaging on Monday 06/20. We discussed in further detail the results of the CT imaging already obtained as well as what to expect from the PET imaging. We discussed that should the findings from the PET indicate the need for further testing, a tissue biopsy would be the next step and would most likely be performed by Dr. Alcide Clever on the lung nodule or lymp node tissue.   Plan: 1.  He will keep his appointment for PET imaging and follow up with [facility]. We will tentatively see him back in 2 weeks pending PET results and further diagnostic findings.   I discussed the assessment and treatment plan with the patient.  The patient was provided an opportunity to ask questions and all were answered.  The patient agreed with the plan and demonstrated an understanding of the instructions.  The patient was advised to call back if the symptoms worsen or if the condition fails to improve as anticipated.  Thank you for the opportunity      Melodye Ped, NP

## 2021-05-14 ENCOUNTER — Encounter: Payer: Self-pay | Admitting: Hematology and Oncology

## 2021-05-14 ENCOUNTER — Other Ambulatory Visit: Payer: Self-pay

## 2021-05-14 ENCOUNTER — Inpatient Hospital Stay (INDEPENDENT_AMBULATORY_CARE_PROVIDER_SITE_OTHER): Payer: Medicare Other | Admitting: Hematology and Oncology

## 2021-05-14 ENCOUNTER — Telehealth: Payer: Self-pay | Admitting: Hematology and Oncology

## 2021-05-14 ENCOUNTER — Inpatient Hospital Stay: Payer: Medicare Other | Attending: Hematology and Oncology

## 2021-05-14 VITALS — BP 133/86 | HR 82 | Temp 98.2°F | Resp 18 | Ht 69.0 in | Wt 138.4 lb

## 2021-05-14 DIAGNOSIS — R911 Solitary pulmonary nodule: Secondary | ICD-10-CM | POA: Diagnosis not present

## 2021-05-14 NOTE — Telephone Encounter (Signed)
Per 6/13 LOS, patient scheduled for 6/27 [place]. Gave patient Appt Summary

## 2021-05-14 NOTE — Progress Notes (Signed)
This encounter was created in error - please disregard.

## 2021-05-18 ENCOUNTER — Ambulatory Visit: Payer: Medicare Other | Admitting: Cardiology

## 2021-05-21 DIAGNOSIS — R918 Other nonspecific abnormal finding of lung field: Secondary | ICD-10-CM | POA: Diagnosis not present

## 2021-05-22 DIAGNOSIS — R918 Other nonspecific abnormal finding of lung field: Secondary | ICD-10-CM | POA: Diagnosis not present

## 2021-05-22 DIAGNOSIS — J449 Chronic obstructive pulmonary disease, unspecified: Secondary | ICD-10-CM | POA: Diagnosis not present

## 2021-05-22 DIAGNOSIS — Z87891 Personal history of nicotine dependence: Secondary | ICD-10-CM | POA: Diagnosis not present

## 2021-05-23 DIAGNOSIS — I340 Nonrheumatic mitral (valve) insufficiency: Secondary | ICD-10-CM | POA: Diagnosis not present

## 2021-05-23 DIAGNOSIS — C3490 Malignant neoplasm of unspecified part of unspecified bronchus or lung: Secondary | ICD-10-CM | POA: Diagnosis not present

## 2021-05-23 DIAGNOSIS — I110 Hypertensive heart disease with heart failure: Secondary | ICD-10-CM | POA: Diagnosis not present

## 2021-05-23 DIAGNOSIS — J189 Pneumonia, unspecified organism: Secondary | ICD-10-CM | POA: Diagnosis not present

## 2021-05-23 DIAGNOSIS — E43 Unspecified severe protein-calorie malnutrition: Secondary | ICD-10-CM | POA: Diagnosis not present

## 2021-05-23 DIAGNOSIS — I509 Heart failure, unspecified: Secondary | ICD-10-CM | POA: Diagnosis not present

## 2021-05-23 DIAGNOSIS — I130 Hypertensive heart and chronic kidney disease with heart failure and stage 1 through stage 4 chronic kidney disease, or unspecified chronic kidney disease: Secondary | ICD-10-CM | POA: Diagnosis not present

## 2021-05-23 DIAGNOSIS — R55 Syncope and collapse: Secondary | ICD-10-CM | POA: Diagnosis not present

## 2021-05-23 DIAGNOSIS — Z681 Body mass index (BMI) 19 or less, adult: Secondary | ICD-10-CM | POA: Diagnosis not present

## 2021-05-23 DIAGNOSIS — N189 Chronic kidney disease, unspecified: Secondary | ICD-10-CM | POA: Diagnosis not present

## 2021-05-23 DIAGNOSIS — I517 Cardiomegaly: Secondary | ICD-10-CM | POA: Diagnosis not present

## 2021-05-23 DIAGNOSIS — Z79899 Other long term (current) drug therapy: Secondary | ICD-10-CM | POA: Diagnosis not present

## 2021-05-23 DIAGNOSIS — K219 Gastro-esophageal reflux disease without esophagitis: Secondary | ICD-10-CM | POA: Diagnosis not present

## 2021-05-23 DIAGNOSIS — I361 Nonrheumatic tricuspid (valve) insufficiency: Secondary | ICD-10-CM | POA: Diagnosis not present

## 2021-05-23 DIAGNOSIS — I959 Hypotension, unspecified: Secondary | ICD-10-CM | POA: Diagnosis not present

## 2021-05-23 DIAGNOSIS — Z8546 Personal history of malignant neoplasm of prostate: Secondary | ICD-10-CM | POA: Diagnosis not present

## 2021-05-24 ENCOUNTER — Telehealth: Payer: Self-pay

## 2021-05-24 DIAGNOSIS — I509 Heart failure, unspecified: Secondary | ICD-10-CM | POA: Diagnosis not present

## 2021-05-24 DIAGNOSIS — R55 Syncope and collapse: Secondary | ICD-10-CM | POA: Diagnosis not present

## 2021-05-24 DIAGNOSIS — I959 Hypotension, unspecified: Secondary | ICD-10-CM | POA: Diagnosis not present

## 2021-05-24 NOTE — Telephone Encounter (Signed)
Is the patient still hospitalized? Consider holding carvedilol for now. He has appt with me on 6/29. Hope that helps.   Thanks MJP

## 2021-05-24 NOTE — Telephone Encounter (Signed)
A Hospitalist with [facility] called and stated that the patient came in due to BP issues. Pts BP when he was at home had a systolic of 74 and when he called his daughter to come get him, the systolic was 757. Pt cannot recall the dystolic numbers. Patient was feeling lightheaded and dizzy, no SOB. Patients bp when he got checked in last night was 112/50, And his last BP check today was 112/50. Patient was symptomatic with low blood pressure. His echo EF was 30-35%.   [place], [place]

## 2021-05-25 NOTE — Telephone Encounter (Signed)
Attempted to call pt, no answer. Left vm requesting call back.

## 2021-05-28 ENCOUNTER — Inpatient Hospital Stay: Payer: Medicare Other | Admitting: Hematology and Oncology

## 2021-05-28 ENCOUNTER — Telehealth: Payer: Self-pay | Admitting: Hematology and Oncology

## 2021-05-28 NOTE — Telephone Encounter (Signed)
05/28/21 Patient called and cancelled appt.Did not want to reschedule at this time.

## 2021-05-30 ENCOUNTER — Ambulatory Visit: Payer: Medicare Other | Admitting: Cardiology

## 2021-05-30 ENCOUNTER — Encounter: Payer: Self-pay | Admitting: Cardiology

## 2021-05-30 ENCOUNTER — Other Ambulatory Visit: Payer: Self-pay

## 2021-05-30 VITALS — BP 142/68 | HR 80 | Temp 98.1°F | Resp 16 | Ht 69.0 in | Wt 130.0 lb

## 2021-05-30 DIAGNOSIS — I340 Nonrheumatic mitral (valve) insufficiency: Secondary | ICD-10-CM

## 2021-05-30 DIAGNOSIS — I10 Essential (primary) hypertension: Secondary | ICD-10-CM

## 2021-05-30 DIAGNOSIS — I5022 Chronic systolic (congestive) heart failure: Secondary | ICD-10-CM

## 2021-05-30 MED ORDER — ENTRESTO 24-26 MG PO TABS: 1.0000 | ORAL_TABLET | Freq: Two times a day (BID) | ORAL | 1 refills | Status: DC

## 2021-05-30 MED ORDER — METOPROLOL SUCCINATE ER 25 MG PO TB24: 12.5000 mg | ORAL_TABLET | Freq: Every day | ORAL | 1 refills | Status: DC

## 2021-05-30 NOTE — Progress Notes (Signed)
 Follow up visit   Subjective:   Robert Hamilton, male    DOB: 09/17/1934, 87 y.o.   MRN: [id number]   Chief Complaint  Patient presents with   Chronic systolic (congestive) heart failure (   Nonrheumatic mitral valve regurgitation   Hypertension   Follow-up    HPI   87-year-old African-American male with hypertension, CKD, h/o tobacco and alcohol abuse, HFrEF, h/o NSVT, diverticular bleed in 03/2019   Patient is here today with his daughter.  Patient was recently hospitalized in [place] after an accidental fall.  At that time, his losartan and BiDil was stopped due to relatively low blood pressure.  Blood pressure is elevated today.  Average blood pressure at home is around 117 SBP.  Patient has had worsening exertional dyspnea.  He is also seeing pulmonology for possible COPD.  In the past, patient has expressed his wishes not to undergo any procedure for work-up of his heart failure.  Continues to have the same wishes.  In fact, he also expressed his wishes to be DNR, to me today.   Current Outpatient Medications on File Prior to Visit  Medication Sig Dispense Refill   allopurinol (ZYLOPRIM) 100 MG tablet Take 100 mg by mouth daily.     carvedilol (COREG) 6.25 MG tablet Take 1 tablet (6.25 mg total) by mouth 2 (two) times daily. 180 tablet 2   Fluticasone-Umeclidin-Vilant (TRELEGY ELLIPTA) 100-62.5-25 MCG/INH AEPB Inhale into the lungs daily.     folic acid (FOLVITE) 1 MG tablet Take 1 mg by mouth daily.     pantoprazole (PROTONIX) 40 MG tablet Take 40 mg by mouth daily.     thiamine (VITAMIN B-1) 100 MG tablet Take 100 mg by mouth daily.     traMADol (ULTRAM) 50 MG tablet Take 50 mg by mouth 3 (three) times daily as needed.     No current facility-administered medications on file prior to visit.    Cardiovascular studies:  Echocardiogram 04/26/2021:  Moderately depressed LV systolic function with visual EF 35-40%. Left  ventricle cavity is normal in size. Hypokinetic  global wall motion.  Doppler evidence of grade II (pseudonormal) diastolic dysfunction, normal  LAP. Moderate left ventricular hypertrophy.  Left atrial cavity is severely dilated.  Trace aortic regurgitation.  Moderate to severe mitral regurgitation.  Moderate to severe tricuspid regurgitation. Mild pulmonary hypertension.  RVSP measures 41 mmHg.  Compared to study dated 07/08/2019: LVEF improved from 30-35% to 35-40%,  LAE is now severe, otherwise no significant change.   EKG 04/18/2021: Sinus rhythm 83 bpm Left bundle branch block Occasional PAC     Holter monitor 07/19/2019:  Dominant rhythm sinus. HR 45-102 bpm. Avg HR 69 bpm.  9.8% ventricular ectopy.  3.8% supraventricular ectopy.  No AFib/aflutter/>3sec pauses/high grade AV block/sustained VT.   Lexiscan nuclear stress test 03/22/2019: 1. No reversible ischemia or infarction. 2. Minimal septal and inferior hypokinesis. 3. Left ventricular ejection fraction 40% 4. Non invasive risk stratification*: Low   Recent labs: 07/18/2020: Glucose 109, BUN/Cr 15/1.28. EGFR 51 HbA1C 6.2%  01/13/2020: Glucose 108, BUN/Cr 15/1.47. EGFR 43  11/2019: HbA1C 5.7% Chol 178, TG 79, HDL 77, LDL 134  06/2019: TSH 2.3 normal    03/24/2019: Glucose 108, BUN/Cr 14/1.37. EGFR 48. Na/K 142/4.1.  H/H 9/27.4. MCV 85. Platelets 126   Review of Systems  Cardiovascular:  Positive for dyspnea on exertion. Negative for chest pain, leg swelling, orthopnea, palpitations and syncope.      Vitals:   05/30/21 1034 05/30/21 1041    BP: (!) 155/84 (!) 142/68  Pulse: 87 80  Resp: 16   Temp: 98.1 F (36.7 C)   SpO2: 97%      Objective:   Physical Exam Vitals and nursing note reviewed.  Constitutional:      General: He is not in acute distress. Neck:     Vascular: No JVD.  Cardiovascular:     Rate and Rhythm: Normal rate and regular rhythm.     Heart sounds: Murmur heard.  High-pitched blowing holosystolic murmur is present with a  grade of 3/6 at the apex.  Pulmonary:     Effort: Pulmonary effort is normal.     Breath sounds: Normal breath sounds. No wheezing or rales.  Musculoskeletal:     Right lower leg: No edema.     Left lower leg: No edema.        Assessment & Recommendations:   87-year-old African-American male with hypertension, CKD, h/o tobacco and alcohol abuse, HFrEF, h/o NSVT, diverticular bleed in 03/2019   HFrEF: EF 35-40%. Moderate to severe MR, moderate TR. PASP 41 mmHg. NYHA class II symptoms. Started Entresto 24-26 mg twice daily.  Switched carvedilol to metoprolol succinate 12.5 mg daily to avoid any bronchospasm side effects. Check BMP in 1 week. Encourage hydration.  Hypertension Blood pressure elevated in office today, but well controlled at home.  Changes made as above to streamline heart failure therapy  Hyperlipidemia: Not on statin due to age and cardiomyopathy.      J , MD [facility] Pager: [phone] Office: [phone] If no answer Cell [phone]    

## 2021-05-31 DIAGNOSIS — R918 Other nonspecific abnormal finding of lung field: Secondary | ICD-10-CM | POA: Diagnosis not present

## 2021-05-31 DIAGNOSIS — N183 Chronic kidney disease, stage 3 unspecified: Secondary | ICD-10-CM | POA: Diagnosis not present

## 2021-05-31 DIAGNOSIS — I1310 Hypertensive heart and chronic kidney disease without heart failure, with stage 1 through stage 4 chronic kidney disease, or unspecified chronic kidney disease: Secondary | ICD-10-CM | POA: Diagnosis not present

## 2021-05-31 DIAGNOSIS — Z681 Body mass index (BMI) 19 or less, adult: Secondary | ICD-10-CM | POA: Diagnosis not present

## 2021-06-01 DIAGNOSIS — M109 Gout, unspecified: Secondary | ICD-10-CM | POA: Diagnosis not present

## 2021-06-01 DIAGNOSIS — I129 Hypertensive chronic kidney disease with stage 1 through stage 4 chronic kidney disease, or unspecified chronic kidney disease: Secondary | ICD-10-CM | POA: Diagnosis not present

## 2021-06-01 DIAGNOSIS — I509 Heart failure, unspecified: Secondary | ICD-10-CM | POA: Diagnosis not present

## 2021-06-05 DIAGNOSIS — R0689 Other abnormalities of breathing: Secondary | ICD-10-CM | POA: Diagnosis not present

## 2021-06-05 DIAGNOSIS — R069 Unspecified abnormalities of breathing: Secondary | ICD-10-CM | POA: Diagnosis not present

## 2021-06-05 DIAGNOSIS — C3490 Malignant neoplasm of unspecified part of unspecified bronchus or lung: Secondary | ICD-10-CM | POA: Diagnosis not present

## 2021-06-05 DIAGNOSIS — I10 Essential (primary) hypertension: Secondary | ICD-10-CM | POA: Diagnosis not present

## 2021-06-05 DIAGNOSIS — J441 Chronic obstructive pulmonary disease with (acute) exacerbation: Secondary | ICD-10-CM | POA: Diagnosis not present

## 2021-06-05 DIAGNOSIS — Z8546 Personal history of malignant neoplasm of prostate: Secondary | ICD-10-CM | POA: Diagnosis not present

## 2021-06-05 DIAGNOSIS — C799 Secondary malignant neoplasm of unspecified site: Secondary | ICD-10-CM | POA: Diagnosis not present

## 2021-06-05 DIAGNOSIS — R062 Wheezing: Secondary | ICD-10-CM | POA: Diagnosis not present

## 2021-06-05 DIAGNOSIS — M1611 Unilateral primary osteoarthritis, right hip: Secondary | ICD-10-CM | POA: Diagnosis not present

## 2021-06-05 DIAGNOSIS — R059 Cough, unspecified: Secondary | ICD-10-CM | POA: Diagnosis not present

## 2021-06-05 DIAGNOSIS — M899 Disorder of bone, unspecified: Secondary | ICD-10-CM | POA: Diagnosis not present

## 2021-06-05 DIAGNOSIS — M79651 Pain in right thigh: Secondary | ICD-10-CM | POA: Diagnosis not present

## 2021-06-05 DIAGNOSIS — J449 Chronic obstructive pulmonary disease, unspecified: Secondary | ICD-10-CM | POA: Diagnosis not present

## 2021-06-05 DIAGNOSIS — R918 Other nonspecific abnormal finding of lung field: Secondary | ICD-10-CM | POA: Diagnosis not present

## 2021-06-05 DIAGNOSIS — J439 Emphysema, unspecified: Secondary | ICD-10-CM | POA: Diagnosis not present

## 2021-06-05 DIAGNOSIS — Z87891 Personal history of nicotine dependence: Secondary | ICD-10-CM | POA: Diagnosis not present

## 2021-06-05 DIAGNOSIS — R0602 Shortness of breath: Secondary | ICD-10-CM | POA: Diagnosis not present

## 2021-06-08 DIAGNOSIS — M899 Disorder of bone, unspecified: Secondary | ICD-10-CM | POA: Diagnosis not present

## 2021-06-08 DIAGNOSIS — R918 Other nonspecific abnormal finding of lung field: Secondary | ICD-10-CM | POA: Diagnosis not present

## 2021-06-08 DIAGNOSIS — Z7689 Persons encountering health services in other specified circumstances: Secondary | ICD-10-CM | POA: Diagnosis not present

## 2021-06-08 DIAGNOSIS — J449 Chronic obstructive pulmonary disease, unspecified: Secondary | ICD-10-CM | POA: Diagnosis not present

## 2021-06-21 ENCOUNTER — Other Ambulatory Visit: Payer: Self-pay

## 2021-06-21 ENCOUNTER — Ambulatory Visit: Payer: Medicare Other | Admitting: Cardiology

## 2021-06-21 ENCOUNTER — Encounter: Payer: Self-pay | Admitting: Cardiology

## 2021-06-21 VITALS — BP 138/86 | HR 106 | Temp 98.9°F | Ht 69.0 in | Wt 118.0 lb

## 2021-06-21 DIAGNOSIS — I10 Essential (primary) hypertension: Secondary | ICD-10-CM

## 2021-06-21 DIAGNOSIS — I5022 Chronic systolic (congestive) heart failure: Secondary | ICD-10-CM | POA: Diagnosis not present

## 2021-06-21 NOTE — Progress Notes (Signed)
Follow up visit   Subjective:   Robert Hamilton, male    DOB: 04-06-1934, 87 y.o.   MRN: [id number]   Chief Complaint  Patient presents with   Hfref   sleepiness    HPI   87 year old African-American male with hypertension, CKD, h/o tobacco and alcohol abuse, HFrEF, h/o NSVT, diverticular bleed in 03/2019   Patient is here today with his daughter.  There is a lot of confusion about the medications he is taking.  For example, he was taking both carvedilol and metoprolol, as well as both Entresto and losartan.  He complains of dizziness and exertional dyspnea.  Also, he states that Lasix does not work as well as it used to before.    He remains very reluctant to perform any lab work.  As stated previously, it is not felt any invasive work-up or management.  Current Outpatient Medications on File Prior to Visit  Medication Sig Dispense Refill   allopurinol (ZYLOPRIM) 100 MG tablet Take 100 mg by mouth daily.     Fluticasone-Umeclidin-Vilant (TRELEGY ELLIPTA) 100-62.5-25 MCG/INH AEPB Inhale into the lungs daily.     folic acid (FOLVITE) 1 MG tablet Take 1 mg by mouth daily.     metoprolol succinate (TOPROL XL) 25 MG 24 hr tablet Take 0.5 tablets (12.5 mg total) by mouth daily. 30 tablet 1   pantoprazole (PROTONIX) 40 MG tablet Take 40 mg by mouth daily.     sacubitril-valsartan (ENTRESTO) 24-26 MG Take 1 tablet by mouth 2 (two) times daily. 60 tablet 1   thiamine (VITAMIN B-1) 100 MG tablet Take 100 mg by mouth daily.     traMADol (ULTRAM) 50 MG tablet Take 50 mg by mouth 3 (three) times daily as needed.     No current facility-administered medications on file prior to visit.    Cardiovascular studies:  Echocardiogram 04/26/2021:  Moderately depressed LV systolic function with visual EF 35-40%. Left  ventricle cavity is normal in size. Hypokinetic global wall motion.  Doppler evidence of grade II (pseudonormal) diastolic dysfunction, normal  LAP. Moderate left ventricular  hypertrophy.  Left atrial cavity is severely dilated.  Trace aortic regurgitation.  Moderate to severe mitral regurgitation.  Moderate to severe tricuspid regurgitation. Mild pulmonary hypertension.  RVSP measures 41 mmHg.  Compared to study dated 07/08/2019: LVEF improved from 30-35% to 35-40%,  LAE is now severe, otherwise no significant change.   EKG 04/18/2021: Sinus rhythm 83 bpm Left bundle branch block Occasional PAC     Holter monitor 07/19/2019:  Dominant rhythm sinus. HR 45-102 bpm. Avg HR 69 bpm.  9.8% ventricular ectopy.  3.8% supraventricular ectopy.  No AFib/aflutter/>3sec pauses/high grade AV block/sustained VT.   Lexiscan nuclear stress test 03/22/2019: 1. No reversible ischemia or infarction. 2. Minimal septal and inferior hypokinesis. 3. Left ventricular ejection fraction 40% 4. Non invasive risk stratification*: Low   Recent labs: 07/18/2020: Glucose 109, BUN/Cr 15/1.28. EGFR 51 HbA1C 6.2%  01/13/2020: Glucose 108, BUN/Cr 15/1.47. EGFR 43  11/2019: HbA1C 5.7% Chol 178, TG 79, HDL 77, LDL 134  06/2019: TSH 2.3 normal    03/24/2019: Glucose 108, BUN/Cr 14/1.37. EGFR 48. Na/K 142/4.1.  H/H 9/27.4. MCV 85. Platelets 126   Review of Systems  Cardiovascular:  Positive for dyspnea on exertion. Negative for chest pain, leg swelling, orthopnea, palpitations and syncope.  Neurological:  Positive for dizziness.      There were no vitals filed for this visit.    Objective:   Physical Exam Vitals and  nursing note reviewed.  Constitutional:      General: He is not in acute distress. Neck:     Vascular: No JVD.  Cardiovascular:     Rate and Rhythm: Normal rate and regular rhythm.     Heart sounds: Murmur heard.  High-pitched blowing holosystolic murmur is present with a grade of 3/6 at the apex.  Pulmonary:     Effort: Pulmonary effort is normal.     Breath sounds: Normal breath sounds. No wheezing or rales.  Musculoskeletal:     Right lower leg:  No edema.     Left lower leg: No edema.        Assessment & Recommendations:   87 year old African-American male with hypertension, CKD, h/o tobacco and alcohol abuse, HFrEF, h/o NSVT, diverticular bleed in 03/2019   HFrEF: EF 35-40%. Moderate to severe MR, moderate TR. PASP 41 mmHg. NYHA class II symptoms. Management difficult due to poor patient insight, reluctance to follow recommendations regarding lab work etc. Explained to them that it is not safe to continue his medical therapy without close monitoring of his renal function.  I told him that I will not be adding any additional medications given my concern for the same.  Stop losartan and metoprolol.   Okay to continue Entresto 24-26 mg twice daily, metoprolol succinate 12.5 mg daily, Lasix daily for now.  Encourage hydration. His heart failure overall health has declined over the last 2 years. I strongly encouraged him to discuss his daughters regarding wishes for advanced directive.    Hypertension Controlled  Hyperlipidemia: Not on statin due to age and cardiomyopathy.   Follow-up in 2 months   Esther Hardy, MD [facility] Pager: [phone] Office: [phone] If no answer Cell [phone]

## 2021-06-25 ENCOUNTER — Inpatient Hospital Stay (HOSPITAL_COMMUNITY)
Admission: EM | Admit: 2021-06-25 | Discharge: 2021-06-28 | DRG: 163 | Disposition: A | Discharge status: Home Health | Payer: Medicare Other | Attending: Internal Medicine | Admitting: Internal Medicine

## 2021-06-25 ENCOUNTER — Emergency Department (HOSPITAL_COMMUNITY): Payer: Medicare Other

## 2021-06-25 ENCOUNTER — Encounter (HOSPITAL_COMMUNITY): Payer: Self-pay | Admitting: Emergency Medicine

## 2021-06-25 ENCOUNTER — Other Ambulatory Visit: Payer: Self-pay

## 2021-06-25 DIAGNOSIS — I5022 Chronic systolic (congestive) heart failure: Secondary | ICD-10-CM | POA: Diagnosis not present

## 2021-06-25 DIAGNOSIS — C3402 Malignant neoplasm of left main bronchus: Principal | ICD-10-CM | POA: Diagnosis present

## 2021-06-25 DIAGNOSIS — Z79899 Other long term (current) drug therapy: Secondary | ICD-10-CM | POA: Diagnosis not present

## 2021-06-25 DIAGNOSIS — R0600 Dyspnea, unspecified: Secondary | ICD-10-CM | POA: Diagnosis not present

## 2021-06-25 DIAGNOSIS — N189 Chronic kidney disease, unspecified: Secondary | ICD-10-CM | POA: Diagnosis not present

## 2021-06-25 DIAGNOSIS — C7951 Secondary malignant neoplasm of bone: Secondary | ICD-10-CM | POA: Diagnosis not present

## 2021-06-25 DIAGNOSIS — Z7951 Long term (current) use of inhaled steroids: Secondary | ICD-10-CM | POA: Diagnosis not present

## 2021-06-25 DIAGNOSIS — R0609 Other forms of dyspnea: Secondary | ICD-10-CM

## 2021-06-25 DIAGNOSIS — C7952 Secondary malignant neoplasm of bone marrow: Secondary | ICD-10-CM | POA: Diagnosis not present

## 2021-06-25 DIAGNOSIS — C3492 Malignant neoplasm of unspecified part of left bronchus or lung: Secondary | ICD-10-CM

## 2021-06-25 DIAGNOSIS — I509 Heart failure, unspecified: Secondary | ICD-10-CM | POA: Diagnosis not present

## 2021-06-25 DIAGNOSIS — R64 Cachexia: Secondary | ICD-10-CM | POA: Diagnosis present

## 2021-06-25 DIAGNOSIS — E8809 Other disorders of plasma-protein metabolism, not elsewhere classified: Secondary | ICD-10-CM | POA: Diagnosis present

## 2021-06-25 DIAGNOSIS — I130 Hypertensive heart and chronic kidney disease with heart failure and stage 1 through stage 4 chronic kidney disease, or unspecified chronic kidney disease: Secondary | ICD-10-CM | POA: Diagnosis not present

## 2021-06-25 DIAGNOSIS — R042 Hemoptysis: Secondary | ICD-10-CM | POA: Diagnosis present

## 2021-06-25 DIAGNOSIS — R5381 Other malaise: Secondary | ICD-10-CM | POA: Diagnosis present

## 2021-06-25 DIAGNOSIS — Z8249 Family history of ischemic heart disease and other diseases of the circulatory system: Secondary | ICD-10-CM | POA: Diagnosis not present

## 2021-06-25 DIAGNOSIS — Z681 Body mass index (BMI) 19 or less, adult: Secondary | ICD-10-CM | POA: Diagnosis not present

## 2021-06-25 DIAGNOSIS — Z20822 Contact with and (suspected) exposure to covid-19: Secondary | ICD-10-CM | POA: Diagnosis not present

## 2021-06-25 DIAGNOSIS — I272 Pulmonary hypertension, unspecified: Secondary | ICD-10-CM | POA: Diagnosis not present

## 2021-06-25 DIAGNOSIS — C3412 Malignant neoplasm of upper lobe, left bronchus or lung: Secondary | ICD-10-CM | POA: Diagnosis not present

## 2021-06-25 DIAGNOSIS — I110 Hypertensive heart disease with heart failure: Secondary | ICD-10-CM | POA: Diagnosis not present

## 2021-06-25 DIAGNOSIS — C801 Malignant (primary) neoplasm, unspecified: Secondary | ICD-10-CM | POA: Diagnosis not present

## 2021-06-25 DIAGNOSIS — D630 Anemia in neoplastic disease: Secondary | ICD-10-CM | POA: Diagnosis present

## 2021-06-25 DIAGNOSIS — R0602 Shortness of breath: Secondary | ICD-10-CM | POA: Diagnosis not present

## 2021-06-25 DIAGNOSIS — G893 Neoplasm related pain (acute) (chronic): Secondary | ICD-10-CM

## 2021-06-25 DIAGNOSIS — Z01811 Encounter for preprocedural respiratory examination: Secondary | ICD-10-CM | POA: Diagnosis not present

## 2021-06-25 DIAGNOSIS — E43 Unspecified severe protein-calorie malnutrition: Secondary | ICD-10-CM | POA: Diagnosis not present

## 2021-06-25 DIAGNOSIS — Z87891 Personal history of nicotine dependence: Secondary | ICD-10-CM | POA: Diagnosis not present

## 2021-06-25 DIAGNOSIS — K219 Gastro-esophageal reflux disease without esophagitis: Secondary | ICD-10-CM | POA: Diagnosis present

## 2021-06-25 DIAGNOSIS — N182 Chronic kidney disease, stage 2 (mild): Secondary | ICD-10-CM | POA: Diagnosis present

## 2021-06-25 DIAGNOSIS — R918 Other nonspecific abnormal finding of lung field: Secondary | ICD-10-CM | POA: Diagnosis not present

## 2021-06-25 DIAGNOSIS — R627 Adult failure to thrive: Secondary | ICD-10-CM | POA: Diagnosis present

## 2021-06-25 DIAGNOSIS — I2699 Other pulmonary embolism without acute cor pulmonale: Secondary | ICD-10-CM | POA: Diagnosis not present

## 2021-06-25 DIAGNOSIS — I10 Essential (primary) hypertension: Secondary | ICD-10-CM | POA: Diagnosis present

## 2021-06-25 DIAGNOSIS — R059 Cough, unspecified: Secondary | ICD-10-CM | POA: Diagnosis not present

## 2021-06-25 DIAGNOSIS — J9809 Other diseases of bronchus, not elsewhere classified: Secondary | ICD-10-CM | POA: Diagnosis not present

## 2021-06-25 DIAGNOSIS — Z9079 Acquired absence of other genital organ(s): Secondary | ICD-10-CM | POA: Diagnosis not present

## 2021-06-25 DIAGNOSIS — I517 Cardiomegaly: Secondary | ICD-10-CM | POA: Diagnosis not present

## 2021-06-25 LAB — COMPREHENSIVE METABOLIC PANEL
ALT: 17 U/L (ref 0–44)
AST: 22 U/L (ref 15–41)
Albumin: 2.6 g/dL — ABNORMAL LOW (ref 3.5–5.0)
Alkaline Phosphatase: 106 U/L (ref 38–126)
Anion gap: 8 (ref 5–15)
BUN: 16 mg/dL (ref 8–23)
CO2: 26 mmol/L (ref 22–32)
Calcium: 12 mg/dL — ABNORMAL HIGH (ref 8.9–10.3)
Chloride: 108 mmol/L (ref 98–111)
Creatinine, Ser: 1.01 mg/dL (ref 0.61–1.24)
GFR, Estimated: >60 mL/min (ref >60)
Glucose, Bld: 105 mg/dL — ABNORMAL HIGH (ref 70–99)
Potassium: 3.7 mmol/L (ref 3.5–5.1)
Sodium: 142 mmol/L (ref 135–145)
Total Bilirubin: 0.6 mg/dL (ref 0.3–1.2)
Total Protein: 8.3 g/dL — ABNORMAL HIGH (ref 6.5–8.1)

## 2021-06-25 LAB — CBC WITH DIFFERENTIAL/PLATELET
Abs Immature Granulocytes: 0.01 10*3/uL (ref 0.00–0.07)
Basophils Absolute: 0 10*3/uL (ref 0.0–0.1)
Basophils Relative: 1 %
Eosinophils Absolute: 0 10*3/uL (ref 0.0–0.5)
Eosinophils Relative: 0 %
HCT: 31.4 % — ABNORMAL LOW (ref 39.0–52.0)
Hemoglobin: 9.4 g/dL — ABNORMAL LOW (ref 13.0–17.0)
Immature Granulocytes: 0 %
Lymphocytes Relative: 18 %
Lymphs Abs: 1.2 10*3/uL (ref 0.7–4.0)
MCH: 25.5 pg — ABNORMAL LOW (ref 26.0–34.0)
MCHC: 29.9 g/dL — ABNORMAL LOW (ref 30.0–36.0)
MCV: 85.3 fL (ref 80.0–100.0)
Monocytes Absolute: 0.5 10*3/uL (ref 0.1–1.0)
Monocytes Relative: 8 %
Neutro Abs: 4.7 10*3/uL (ref 1.7–7.7)
Neutrophils Relative %: 73 %
Platelets: 310 10*3/uL (ref 150–400)
RBC: 3.68 MIL/uL — ABNORMAL LOW (ref 4.22–5.81)
RDW: 13.7 % (ref 11.5–15.5)
WBC: 6.4 10*3/uL (ref 4.0–10.5)
nRBC: 0 % (ref 0.0–0.2)

## 2021-06-25 LAB — TROPONIN I (HIGH SENSITIVITY)
Troponin I (High Sensitivity): 30 ng/L — ABNORMAL HIGH (ref <18)
Troponin I (High Sensitivity): 31 ng/L — ABNORMAL HIGH (ref <18)

## 2021-06-25 LAB — BRAIN NATRIURETIC PEPTIDE: B Natriuretic Peptide: 501.9 pg/mL — ABNORMAL HIGH (ref 0.0–100.0)

## 2021-06-25 LAB — D-DIMER, QUANTITATIVE: D-Dimer, Quant: 1.58 ug/mL-FEU — ABNORMAL HIGH (ref 0.00–0.50)

## 2021-06-25 LAB — RESP PANEL BY RT-PCR (FLU A&B, COVID) ARPGX2
Influenza A by PCR: NEGATIVE
Influenza B by PCR: NEGATIVE
SARS Coronavirus 2 by RT PCR: NEGATIVE

## 2021-06-25 IMAGING — CT CT ABD-PELV W/ CM
2 of 5 series · 11 of 46 positions shown, 12 images · IV contrast (omnipaque)
Comparison: [DATE]
COMPARISON: [DATE]

Addendum:
CLINICAL DATA: Shortness of breath times months, cough

EXAM:
CT ANGIOGRAPHY CHEST
CT ABDOMEN AND PELVIS WITH CONTRAST
TECHNIQUE: Multidetector CT imaging of the chest was performed using the
standard protocol during bolus administration of intravenous
contrast. Multiplanar CT image reconstructions and MIPs were
obtained to evaluate the vascular anatomy. Multidetector CT imaging
of the abdomen and pelvis was performed using the standard protocol
during bolus administration of intravenous contrast.
CONTRAST:  80mL OMNIPAQUE IOHEXOL 350 MG/ML SOLN

[Series 4: axial st · axial · 0.77mm/px · z∈[-576,-126]mm · 8 of 104 slices shown, 9 images]
[im 7/104  soft-tissue]
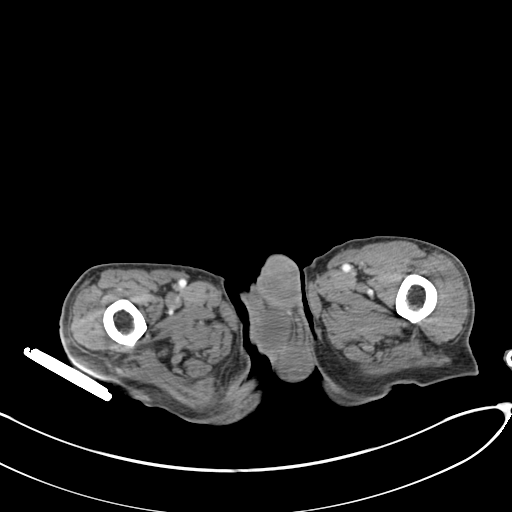
[im 7/104  bone]
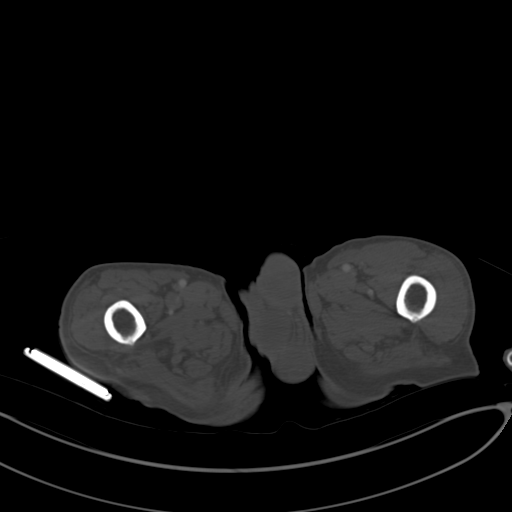
[im 21/104  soft-tissue]
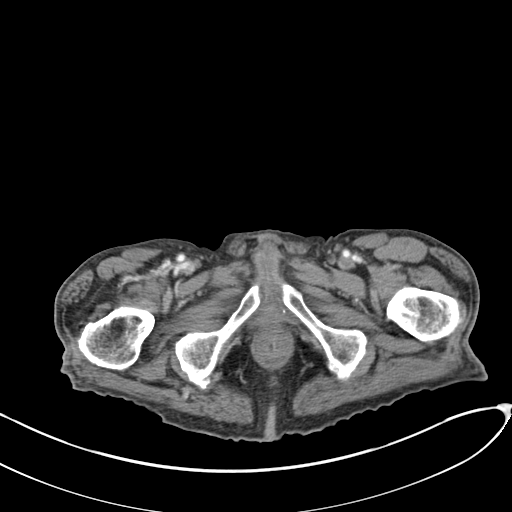
[im 35/104  soft-tissue]
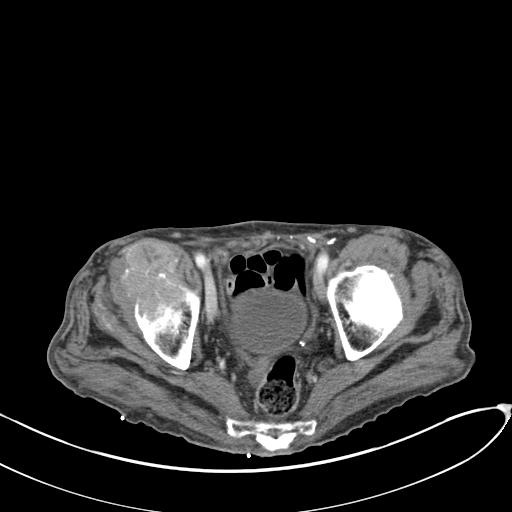
[im 49/104  soft-tissue]
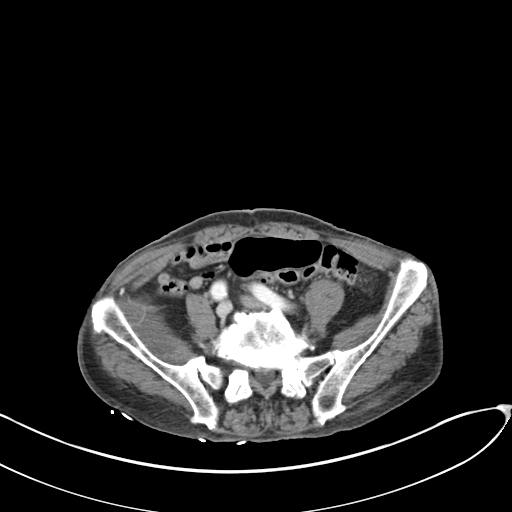
[im 55/104  soft-tissue]
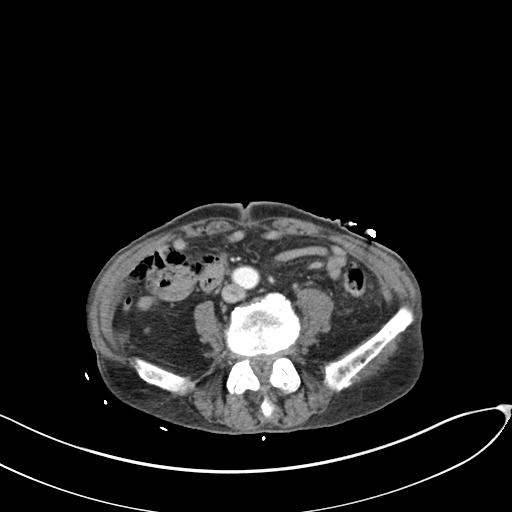
[im 69/104  soft-tissue]
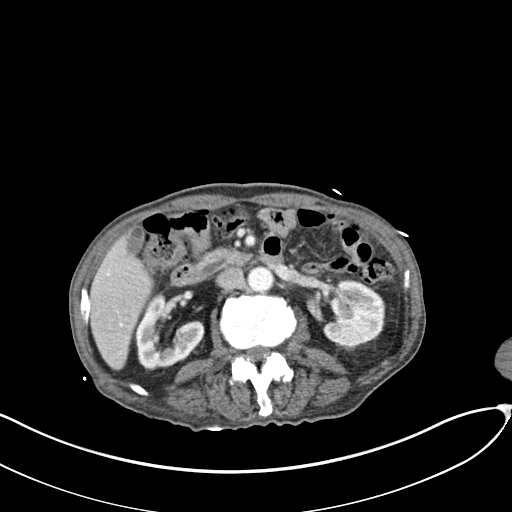
[im 83/104  soft-tissue]
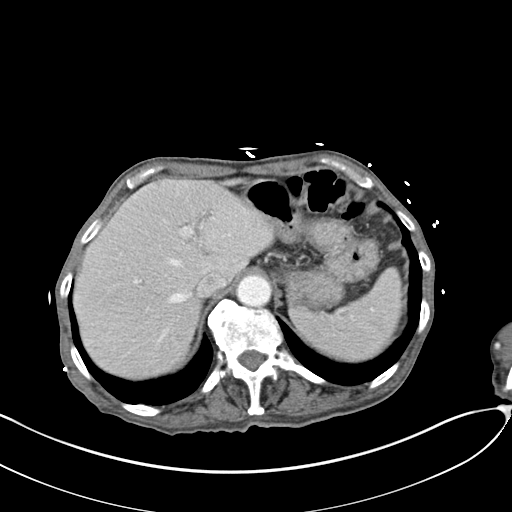
[im 97/104  soft-tissue]
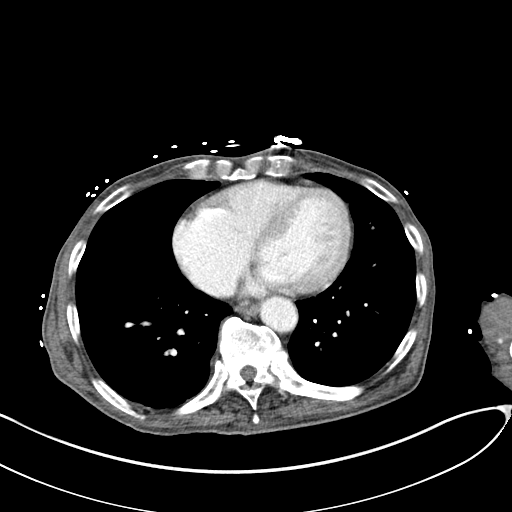

[Series 6: coronal st · coronal · 0.65mm/px · 3 of 114 slices shown]
[im 38/114  soft-tissue]
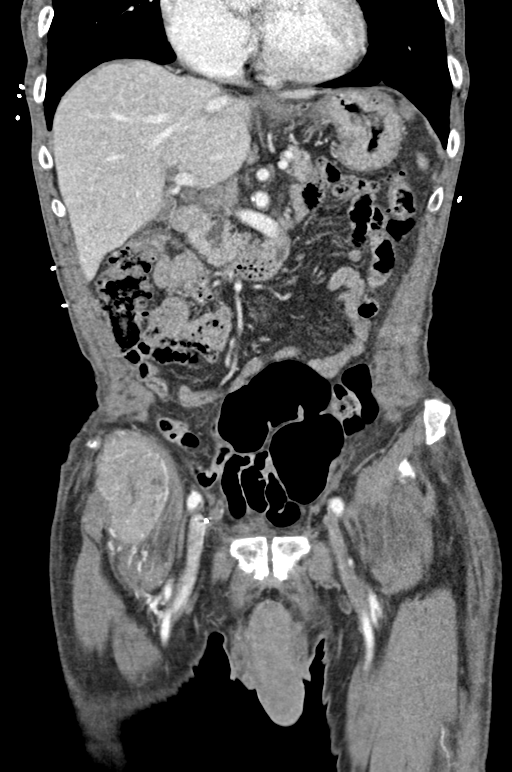
[im 51/114  soft-tissue]
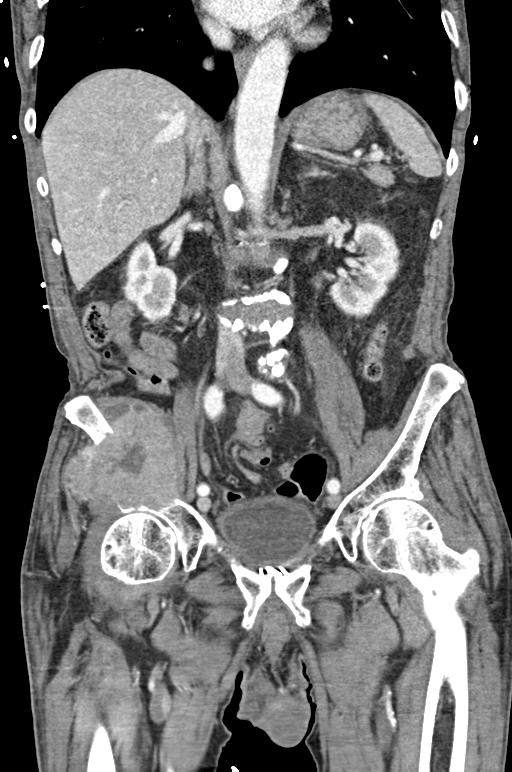
[im 63/114  soft-tissue]
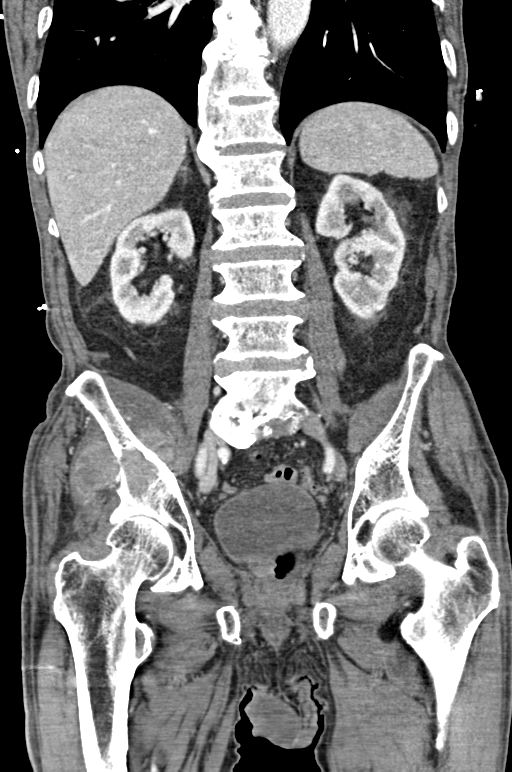

[11 of 46 positions shown; findings below may reference images not displayed]

FINDINGS: CTA CHEST FINDINGS

Cardiovascular: Heart size upper limits normal. No pericardial
effusion. Good contrast opacification of pulmonary arterial tree.
Partially occlusive embolus in the upper lobe branch of the left
pulmonary artery extending into segmental branches. Non
opacification of lingular segmental branches of the left pulmonary
artery may be due to embolus or tumor involvement.

Near-occlusive narrowing of the left superior and inferior pulmonary
veins by hilar mass. Mild scattered calcified atheromatous plaque in
the arch and descending thoracic aorta.

Mediastinum/Nodes: 6 cm confluent left hilar mass/adenopathy
involving left pulmonary veins, and lingular branches of the
pulmonary artery as above. Subcentimeter AP window and right
paratracheal lymph nodes.

Lungs/Pleura: Left hilar mass as above, with nodular changes
extending along lingular vessels. There is relative hypoperfusion
of the left lung. Emphysematous changes in the right upper lobe. No
pleural effusion. No pneumothorax.

Musculoskeletal: Bridging osteophytes across multiple levels in the
mid and lower thoracic spine. No acute fracture or worrisome bone
lesion. Metallic fragments in the right posterior subcutaneous
tissues, and adjacent to the T2 spinous process.

Review of the MIP images confirms the above findings.

CT ABDOMEN and PELVIS FINDINGS

Hepatobiliary: No focal liver abnormality is seen. No gallstones,
gallbladder wall thickening, or biliary dilatation.

Pancreas: Unremarkable. No pancreatic ductal dilatation or
surrounding inflammatory changes.

Spleen: Normal in size without focal abnormality.

Adrenals/Urinary Tract: No adrenal mass. Symmetric renal enhancement
without hydronephrosis or focal lesion. Urinary bladder is
incompletely distended.

Stomach/Bowel: Stomach decompressed. Small bowel decompressed.
Normal appendix. The colon is nondilated, unremarkable.

Vascular/Lymphatic: Atheromatous aorta. No abdominal or pelvic
adenopathy.

Reproductive: Suspect previous prostatectomy.

Other: No ascites.  No free air.

Musculoskeletal: 8.1 cm lytic hyperemic mass involving the right
iliac wing and supra-acetabular region. No acute fracture.

Review of the MIP images confirms the above findings.
IMPRESSION: 1. 6 cm confluent left hilar mass with vascular involvement as
above, presumably lung carcinoma.
2. 8.1 cm lytic metastasis involving right iliac wing, presumably
metastasis, of potential orthopedic significance given the
supra-acetabular involvement. This would be approachable for
confirmatory core biopsy if needed.
3. Probable embolus involving left upper lobe pulmonary artery
branches. Non opacification of lingula branch is more likely related
to tumor encasement/involvement.
4.  Emphysema ([MD]-[MD]).

ADDENDUM:
Upon further review, note made of distal occlusion of the left
mainstem bronchus extending through the left upper lobe bronchus,
contiguous with the described hilar mass.

*** End of Addendum ***
FINDINGS: CTA CHEST FINDINGS

Cardiovascular: Heart size upper limits normal. No pericardial
effusion. Good contrast opacification of pulmonary arterial tree.
Partially occlusive embolus in the upper lobe branch of the left
pulmonary artery extending into segmental branches. Non
opacification of lingular segmental branches of the left pulmonary
artery may be due to embolus or tumor involvement.

Near-occlusive narrowing of the left superior and inferior pulmonary
veins by hilar mass. Mild scattered calcified atheromatous plaque in
the arch and descending thoracic aorta.

Mediastinum/Nodes: 6 cm confluent left hilar mass/adenopathy
involving left pulmonary veins, and lingular branches of the
pulmonary artery as above. Subcentimeter AP window and right
paratracheal lymph nodes.

Lungs/Pleura: Left hilar mass as above, with nodular changes
extending along lingular vessels. There is relative hypoperfusion
of the left lung. Emphysematous changes in the right upper lobe. No
pleural effusion. No pneumothorax.

Musculoskeletal: Bridging osteophytes across multiple levels in the
mid and lower thoracic spine. No acute fracture or worrisome bone
lesion. Metallic fragments in the right posterior subcutaneous
tissues, and adjacent to the T2 spinous process.

Review of the MIP images confirms the above findings.

CT ABDOMEN and PELVIS FINDINGS

Hepatobiliary: No focal liver abnormality is seen. No gallstones,
gallbladder wall thickening, or biliary dilatation.

Pancreas: Unremarkable. No pancreatic ductal dilatation or
surrounding inflammatory changes.

Spleen: Normal in size without focal abnormality.

Adrenals/Urinary Tract: No adrenal mass. Symmetric renal enhancement
without hydronephrosis or focal lesion. Urinary bladder is
incompletely distended.

Stomach/Bowel: Stomach decompressed. Small bowel decompressed.
Normal appendix. The colon is nondilated, unremarkable.

Vascular/Lymphatic: Atheromatous aorta. No abdominal or pelvic
adenopathy.

Reproductive: Suspect previous prostatectomy.

Other: No ascites.  No free air.

Musculoskeletal: 8.1 cm lytic hyperemic mass involving the right
iliac wing and supra-acetabular region. No acute fracture.

Review of the MIP images confirms the above findings.
IMPRESSION: 1. 6 cm confluent left hilar mass with vascular involvement as
above, presumably lung carcinoma.
2. 8.1 cm lytic metastasis involving right iliac wing, presumably
metastasis, of potential orthopedic significance given the
supra-acetabular involvement. This would be approachable for
confirmatory core biopsy if needed.
3. Probable embolus involving left upper lobe pulmonary artery
branches. Non opacification of lingula branch is more likely related
to tumor encasement/involvement.
4.  Emphysema ([MD]-[MD]).

## 2021-06-25 IMAGING — CT CT ANGIO CHEST
2 of 6 series · 15 of 36 positions shown · IV contrast (omnipaque)
Comparison: [DATE]
COMPARISON: [DATE]

Addendum:
CLINICAL DATA: Shortness of breath times months, cough

EXAM:
CT ANGIOGRAPHY CHEST
CT ABDOMEN AND PELVIS WITH CONTRAST
TECHNIQUE: Multidetector CT imaging of the chest was performed using the
standard protocol during bolus administration of intravenous
contrast. Multiplanar CT image reconstructions and MIPs were
obtained to evaluate the vascular anatomy. Multidetector CT imaging
of the abdomen and pelvis was performed using the standard protocol
during bolus administration of intravenous contrast.
CONTRAST:  80mL OMNIPAQUE IOHEXOL 350 MG/ML SOLN

[Series 3: thins · axial · 0.73mm/px · z∈[-229,+62]mm · 14 of 329 slices shown]
[im 19/329  lung]
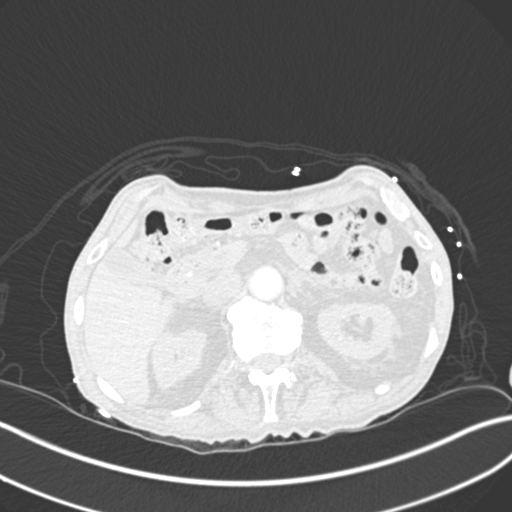
[im 37/329  mediastinal]
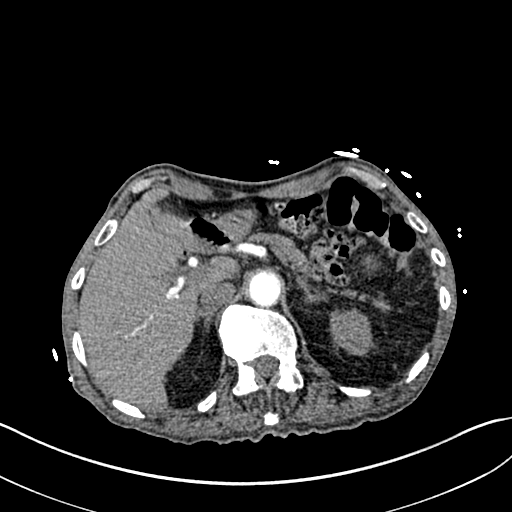
[im 73/329  lung]
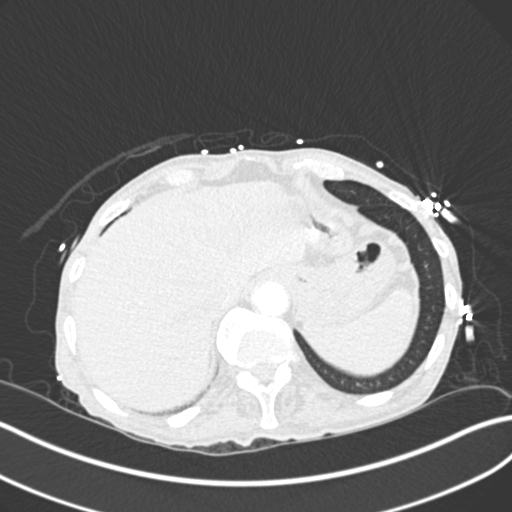
[im 92/329  mediastinal]
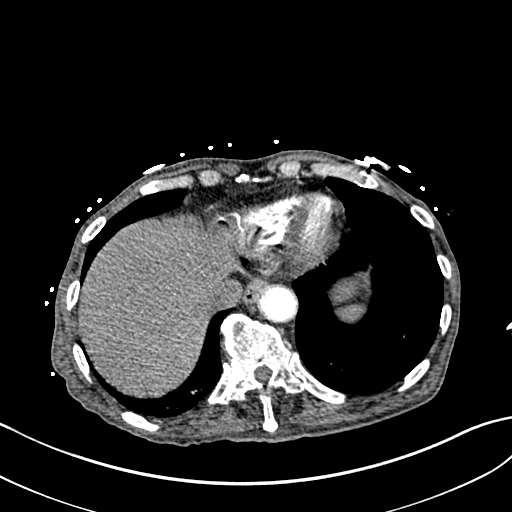
[im 110/329  lung]
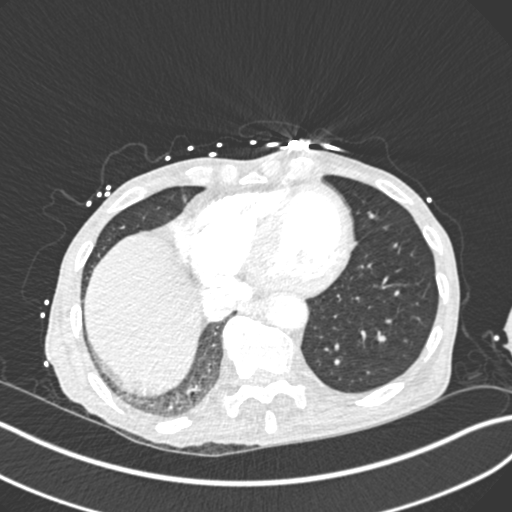
[im 128/329  mediastinal]
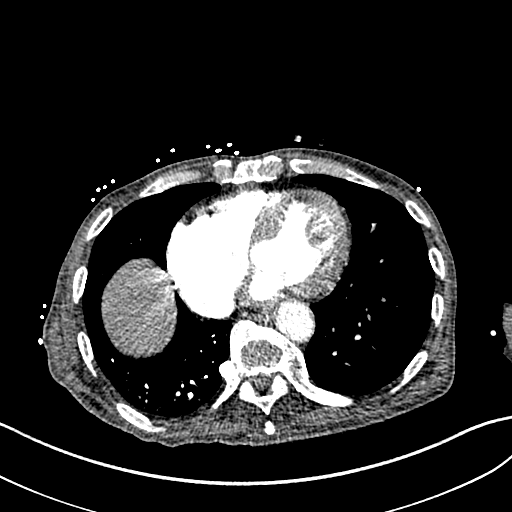
[im 146/329  lung]
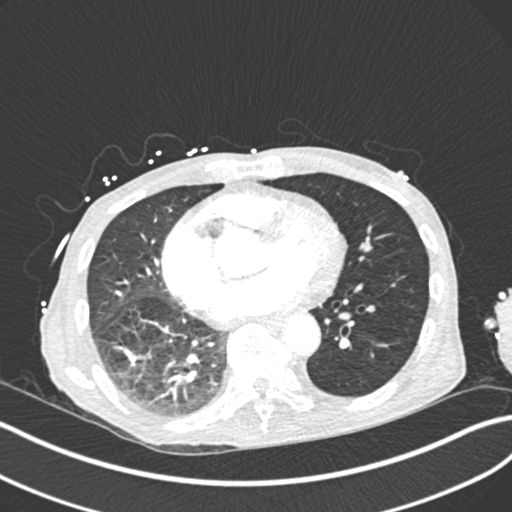
[im 183/329  mediastinal]
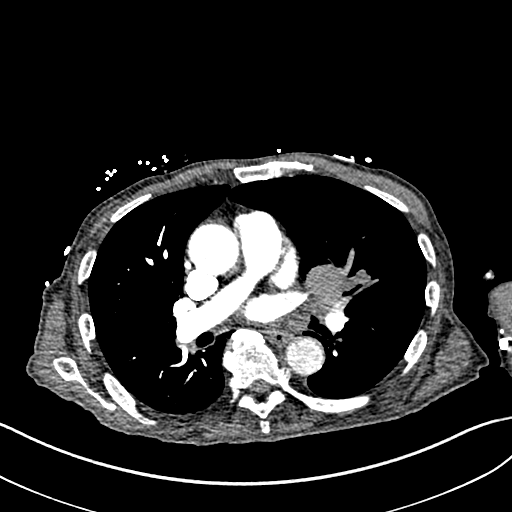
[im 201/329  lung]
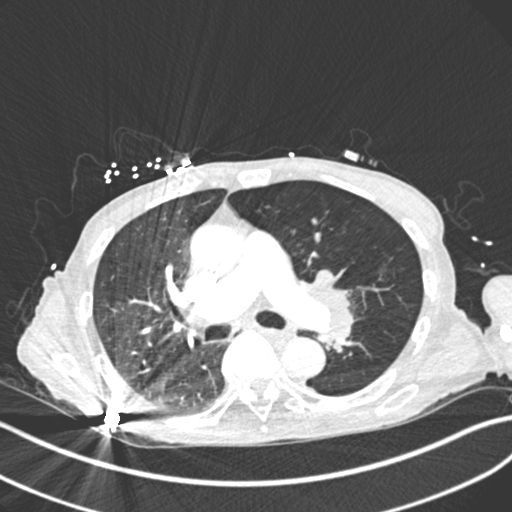
[im 219/329  mediastinal]
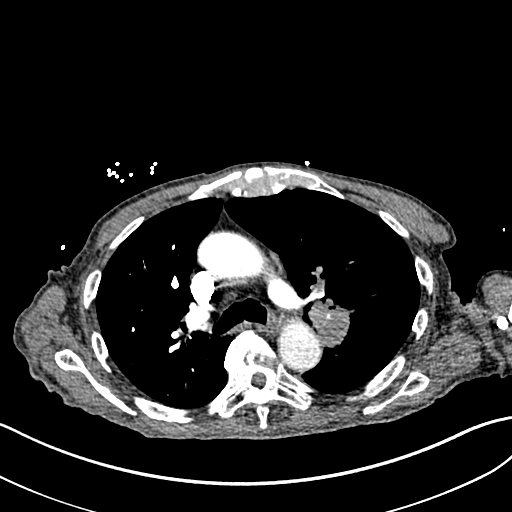
[im 237/329  lung]
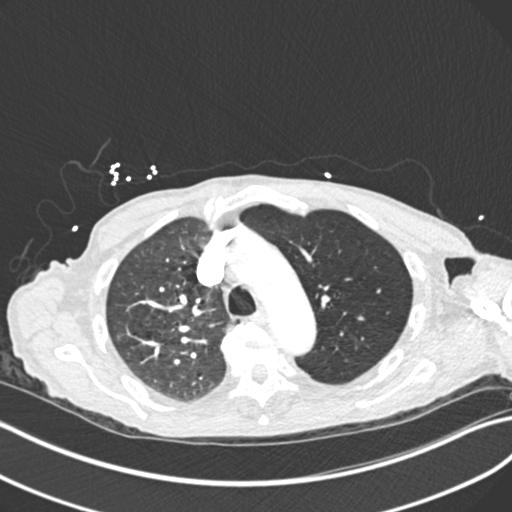
[im 256/329  mediastinal]
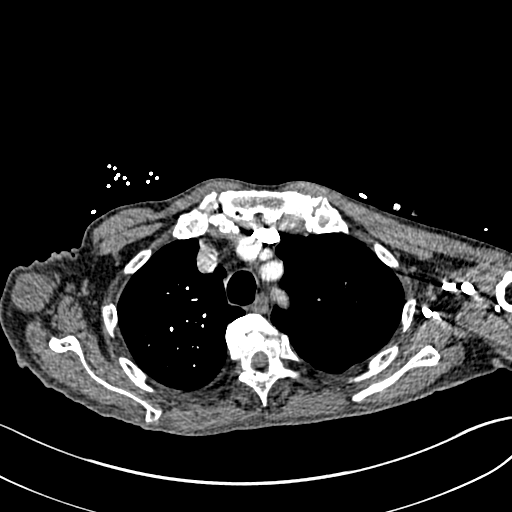
[im 292/329  lung]
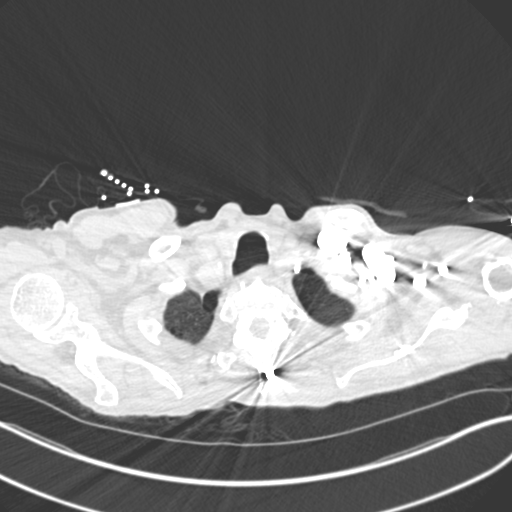
[im 310/329  mediastinal]
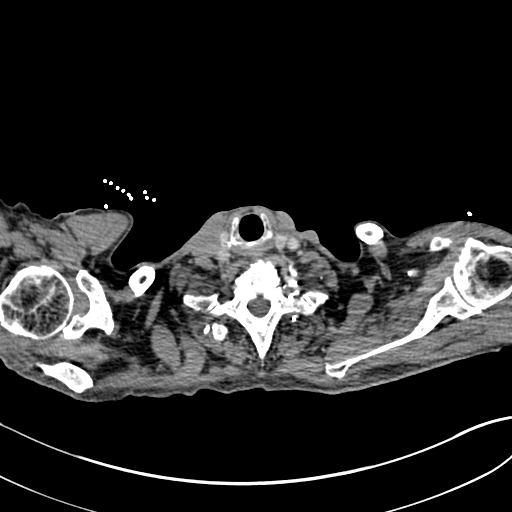

[Series 4: coronal mpr · coronal · 0.61mm/px · 1 of 115 slices shown]
[im 58/115  mediastinal]
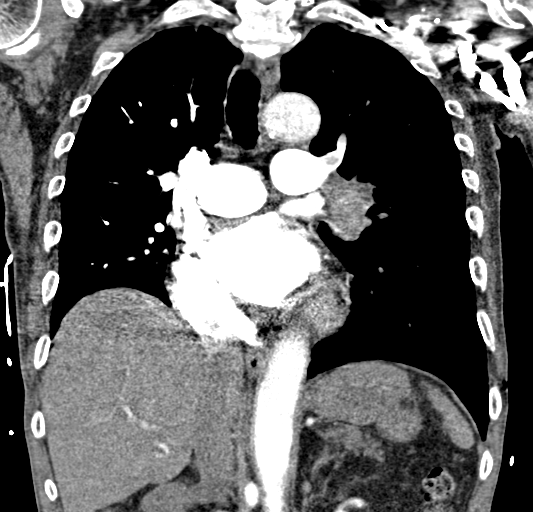

[15 of 36 positions shown; findings below may reference images not displayed]

FINDINGS: CTA CHEST FINDINGS

Cardiovascular: Heart size upper limits normal. No pericardial
effusion. Good contrast opacification of pulmonary arterial tree.
Partially occlusive embolus in the upper lobe branch of the left
pulmonary artery extending into segmental branches. Non
opacification of lingular segmental branches of the left pulmonary
artery may be due to embolus or tumor involvement.

Near-occlusive narrowing of the left superior and inferior pulmonary
veins by hilar mass. Mild scattered calcified atheromatous plaque in
the arch and descending thoracic aorta.

Mediastinum/Nodes: 6 cm confluent left hilar mass/adenopathy
involving left pulmonary veins, and lingular branches of the
pulmonary artery as above. Subcentimeter AP window and right
paratracheal lymph nodes.

Lungs/Pleura: Left hilar mass as above, with nodular changes
extending along lingular vessels. There is relative hypoperfusion
of the left lung. Emphysematous changes in the right upper lobe. No
pleural effusion. No pneumothorax.

Musculoskeletal: Bridging osteophytes across multiple levels in the
mid and lower thoracic spine. No acute fracture or worrisome bone
lesion. Metallic fragments in the right posterior subcutaneous
tissues, and adjacent to the T2 spinous process.

Review of the MIP images confirms the above findings.

CT ABDOMEN and PELVIS FINDINGS

Hepatobiliary: No focal liver abnormality is seen. No gallstones,
gallbladder wall thickening, or biliary dilatation.

Pancreas: Unremarkable. No pancreatic ductal dilatation or
surrounding inflammatory changes.

Spleen: Normal in size without focal abnormality.

Adrenals/Urinary Tract: No adrenal mass. Symmetric renal enhancement
without hydronephrosis or focal lesion. Urinary bladder is
incompletely distended.

Stomach/Bowel: Stomach decompressed. Small bowel decompressed.
Normal appendix. The colon is nondilated, unremarkable.

Vascular/Lymphatic: Atheromatous aorta. No abdominal or pelvic
adenopathy.

Reproductive: Suspect previous prostatectomy.

Other: No ascites.  No free air.

Musculoskeletal: 8.1 cm lytic hyperemic mass involving the right
iliac wing and supra-acetabular region. No acute fracture.

Review of the MIP images confirms the above findings.
IMPRESSION: 1. 6 cm confluent left hilar mass with vascular involvement as
above, presumably lung carcinoma.
2. 8.1 cm lytic metastasis involving right iliac wing, presumably
metastasis, of potential orthopedic significance given the
supra-acetabular involvement. This would be approachable for
confirmatory core biopsy if needed.
3. Probable embolus involving left upper lobe pulmonary artery
branches. Non opacification of lingula branch is more likely related
to tumor encasement/involvement.
4.  Emphysema ([MD]-[MD]).

ADDENDUM:
Upon further review, note made of distal occlusion of the left
mainstem bronchus extending through the left upper lobe bronchus,
contiguous with the described hilar mass.

*** End of Addendum ***
FINDINGS: CTA CHEST FINDINGS

Cardiovascular: Heart size upper limits normal. No pericardial
effusion. Good contrast opacification of pulmonary arterial tree.
Partially occlusive embolus in the upper lobe branch of the left
pulmonary artery extending into segmental branches. Non
opacification of lingular segmental branches of the left pulmonary
artery may be due to embolus or tumor involvement.

Near-occlusive narrowing of the left superior and inferior pulmonary
veins by hilar mass. Mild scattered calcified atheromatous plaque in
the arch and descending thoracic aorta.

Mediastinum/Nodes: 6 cm confluent left hilar mass/adenopathy
involving left pulmonary veins, and lingular branches of the
pulmonary artery as above. Subcentimeter AP window and right
paratracheal lymph nodes.

Lungs/Pleura: Left hilar mass as above, with nodular changes
extending along lingular vessels. There is relative hypoperfusion
of the left lung. Emphysematous changes in the right upper lobe. No
pleural effusion. No pneumothorax.

Musculoskeletal: Bridging osteophytes across multiple levels in the
mid and lower thoracic spine. No acute fracture or worrisome bone
lesion. Metallic fragments in the right posterior subcutaneous
tissues, and adjacent to the T2 spinous process.

Review of the MIP images confirms the above findings.

CT ABDOMEN and PELVIS FINDINGS

Hepatobiliary: No focal liver abnormality is seen. No gallstones,
gallbladder wall thickening, or biliary dilatation.

Pancreas: Unremarkable. No pancreatic ductal dilatation or
surrounding inflammatory changes.

Spleen: Normal in size without focal abnormality.

Adrenals/Urinary Tract: No adrenal mass. Symmetric renal enhancement
without hydronephrosis or focal lesion. Urinary bladder is
incompletely distended.

Stomach/Bowel: Stomach decompressed. Small bowel decompressed.
Normal appendix. The colon is nondilated, unremarkable.

Vascular/Lymphatic: Atheromatous aorta. No abdominal or pelvic
adenopathy.

Reproductive: Suspect previous prostatectomy.

Other: No ascites.  No free air.

Musculoskeletal: 8.1 cm lytic hyperemic mass involving the right
iliac wing and supra-acetabular region. No acute fracture.

Review of the MIP images confirms the above findings.
IMPRESSION: 1. 6 cm confluent left hilar mass with vascular involvement as
above, presumably lung carcinoma.
2. 8.1 cm lytic metastasis involving right iliac wing, presumably
metastasis, of potential orthopedic significance given the
supra-acetabular involvement. This would be approachable for
confirmatory core biopsy if needed.
3. Probable embolus involving left upper lobe pulmonary artery
branches. Non opacification of lingula branch is more likely related
to tumor encasement/involvement.
4.  Emphysema ([MD]-[MD]).
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
## 2021-06-25 MED ORDER — ACETAMINOPHEN 325 MG PO TABS
650.0000 mg | ORAL_TABLET | Freq: Four times a day (QID) | ORAL | Status: DC | PRN
  Filled 2021-06-25: qty 2

## 2021-06-25 MED ORDER — METOPROLOL SUCCINATE ER 25 MG PO TB24
12.5000 mg | ORAL_TABLET | Freq: Every day | ORAL | Status: DC
  Administered 2021-06-26 – 2021-06-28 (×4): 12.5 mg via ORAL
  Filled 2021-06-25: qty 1
  Filled 2021-06-25: qty 0.5
  Filled 2021-06-25: qty 1
  Filled 2021-06-25: qty 0.5

## 2021-06-25 MED ORDER — ACETAMINOPHEN 650 MG RE SUPP: 650.0000 mg | Freq: Four times a day (QID) | RECTAL | Status: DC | PRN

## 2021-06-25 MED ORDER — PANTOPRAZOLE SODIUM 40 MG PO TBEC
40.0000 mg | DELAYED_RELEASE_TABLET | Freq: Every day | ORAL | Status: DC
  Administered 2021-06-25 – 2021-06-28 (×4): 40 mg via ORAL
  Filled 2021-06-25 (×4): qty 1

## 2021-06-25 MED ORDER — FUROSEMIDE 20 MG PO TABS
20.0000 mg | ORAL_TABLET | Freq: Every day | ORAL | Status: DC
  Administered 2021-06-25 – 2021-06-28 (×4): 20 mg via ORAL
  Filled 2021-06-25 (×4): qty 1

## 2021-06-25 MED ORDER — OXYCODONE HCL 5 MG PO TABS
5.0000 mg | ORAL_TABLET | ORAL | Status: DC | PRN
  Administered 2021-06-26 – 2021-06-28 (×4): 5 mg via ORAL
  Filled 2021-06-25 (×4): qty 1

## 2021-06-25 MED ORDER — ONDANSETRON HCL 4 MG PO TABS: 4.0000 mg | ORAL_TABLET | Freq: Four times a day (QID) | ORAL | Status: DC | PRN

## 2021-06-25 MED ORDER — VITAMIN B-1 100 MG PO TABS
100.0000 mg | ORAL_TABLET | Freq: Every day | ORAL | Status: DC
  Administered 2021-06-25: 100 mg via ORAL
  Filled 2021-06-25 (×2): qty 1

## 2021-06-25 MED ORDER — HEPARIN BOLUS VIA INFUSION
2000.0000 [IU] | Freq: Once | INTRAVENOUS | Status: AC
  Administered 2021-06-25: 2000 [IU] via INTRAVENOUS
  Filled 2021-06-25: qty 2000

## 2021-06-25 MED ORDER — FLUTICASONE-UMECLIDIN-VILANT 100-62.5-25 MCG/INH IN AEPB: INHALATION_SPRAY | Freq: Every day | RESPIRATORY_TRACT | Status: DC

## 2021-06-25 MED ORDER — IOHEXOL 350 MG/ML SOLN
100.0000 mL | Freq: Once | INTRAVENOUS | Status: AC | PRN
  Administered 2021-06-25: 80 mL via INTRAVENOUS

## 2021-06-25 MED ORDER — THIAMINE HCL 100 MG PO TABS
100.0000 mg | ORAL_TABLET | Freq: Every day | ORAL | Status: DC
  Administered 2021-06-26 – 2021-06-28 (×3): 100 mg via ORAL
  Filled 2021-06-25 (×3): qty 1

## 2021-06-25 MED ORDER — DOCUSATE SODIUM 100 MG PO CAPS
100.0000 mg | ORAL_CAPSULE | Freq: Two times a day (BID) | ORAL | Status: DC
  Administered 2021-06-26 – 2021-06-28 (×5): 100 mg via ORAL
  Filled 2021-06-25 (×4): qty 1

## 2021-06-25 MED ORDER — FOLIC ACID 1 MG PO TABS
1.0000 mg | ORAL_TABLET | Freq: Every day | ORAL | Status: DC
  Administered 2021-06-25 – 2021-06-28 (×4): 1 mg via ORAL
  Filled 2021-06-25 (×4): qty 1

## 2021-06-25 MED ORDER — ALBUTEROL SULFATE (2.5 MG/3ML) 0.083% IN NEBU: 2.5000 mg | INHALATION_SOLUTION | RESPIRATORY_TRACT | Status: DC | PRN

## 2021-06-25 MED ORDER — HYDRALAZINE HCL 25 MG PO TABS: 25.0000 mg | ORAL_TABLET | Freq: Four times a day (QID) | ORAL | Status: DC | PRN

## 2021-06-25 MED ORDER — ONDANSETRON HCL 4 MG/2ML IJ SOLN: 4.0000 mg | Freq: Four times a day (QID) | INTRAMUSCULAR | Status: DC | PRN

## 2021-06-25 MED ORDER — SACUBITRIL-VALSARTAN 24-26 MG PO TABS
1.0000 | ORAL_TABLET | Freq: Two times a day (BID) | ORAL | Status: DC
  Administered 2021-06-26 – 2021-06-28 (×5): 1 via ORAL
  Filled 2021-06-25 (×6): qty 1

## 2021-06-25 MED ORDER — HEPARIN (PORCINE) 25000 UT/250ML-% IV SOLN
1200.0000 [IU]/h | INTRAVENOUS | Status: DC
  Administered 2021-06-25: 1000 [IU]/h via INTRAVENOUS
  Filled 2021-06-25: qty 250

## 2021-06-25 NOTE — Progress Notes (Signed)
[facility] for heparin Indication: suspected pulmonary embolus  No Known Allergies  Patient Measurements: Height: 5\' 9"  (175.3 cm) Weight: 53.5 kg (118 lb) IBW/kg (Calculated) : 70.7 Heparin Dosing Weight: 53 kg  Vital Signs: Temp: 98 F (36.7 C) (07/25 1155) Temp Source: Oral (07/25 1155) BP: 137/80 (07/25 1701) Pulse Rate: 93 (07/25 1701)  Labs: Recent Labs    06/25/21 1212 06/25/21 1449  HGB 9.4*  --   HCT 31.4*  --   PLT 310  --   CREATININE 1.01  --   TROPONINIHS 30* 31*    Estimated Creatinine Clearance: 41.2 mL/min (by C-G formula based on SCr of 1.01 mg/dL).   Assessment: Patient is an 87 y.o M presented to the ED on 7/25 with c/o SOB and hemoptysis. Chest CT on 7/25 showed lung mass and "probable embolus involving left upper lobe pulmonary artery Branches." Pharmacy has been consulted to dose heparin drip for PE.   Goal of Therapy:  Heparin level 0.3-0.7 units/ml Monitor platelets by anticoagulation protocol: Yes   Plan:  - heparin 2000 units IV x1 bolus, then drip at 1000 units/hr - check 8 hr heparin level - monitor for s/sx bleeding  Linna Thebeau P 06/25/2021,5:59 PM

## 2021-06-25 NOTE — ED Notes (Signed)
Patients daughter called and wanted to let her father know she will see him in the morning (Phillis)
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
## 2021-06-25 NOTE — ED Triage Notes (Signed)
Pt [facility] with c/o SOB. Pt reports this has been ongoing for the past several months. Pt reports also coughing up white sputum with a little bit of blood. Pt is currently undergoing testing for lung cancer in [place] and has an appointment in August for final dx. Hx of COPD  136/86 102 95% room air,
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
## 2021-06-26 ENCOUNTER — Encounter (HOSPITAL_COMMUNITY): Payer: Self-pay | Admitting: Internal Medicine

## 2021-06-26 ENCOUNTER — Other Ambulatory Visit: Payer: Self-pay

## 2021-06-26 ENCOUNTER — Observation Stay (HOSPITAL_COMMUNITY): Payer: Medicare Other | Admitting: Anesthesiology

## 2021-06-26 ENCOUNTER — Encounter (HOSPITAL_COMMUNITY)
Admission: EM | Disposition: A | Discharge status: Home Health | Payer: Self-pay | Source: Home / Self Care | Attending: Internal Medicine

## 2021-06-26 ENCOUNTER — Ambulatory Visit (HOSPITAL_COMMUNITY): Admit: 2021-06-26 | Payer: Medicare Other | Admitting: Pulmonary Disease

## 2021-06-26 DIAGNOSIS — R042 Hemoptysis: Secondary | ICD-10-CM | POA: Diagnosis present

## 2021-06-26 DIAGNOSIS — Z01811 Encounter for preprocedural respiratory examination: Secondary | ICD-10-CM | POA: Diagnosis not present

## 2021-06-26 DIAGNOSIS — K219 Gastro-esophageal reflux disease without esophagitis: Secondary | ICD-10-CM | POA: Diagnosis present

## 2021-06-26 DIAGNOSIS — C3492 Malignant neoplasm of unspecified part of left bronchus or lung: Secondary | ICD-10-CM | POA: Diagnosis not present

## 2021-06-26 DIAGNOSIS — E43 Unspecified severe protein-calorie malnutrition: Secondary | ICD-10-CM | POA: Diagnosis present

## 2021-06-26 DIAGNOSIS — J9809 Other diseases of bronchus, not elsewhere classified: Secondary | ICD-10-CM | POA: Diagnosis not present

## 2021-06-26 DIAGNOSIS — Z7951 Long term (current) use of inhaled steroids: Secondary | ICD-10-CM | POA: Diagnosis not present

## 2021-06-26 DIAGNOSIS — R918 Other nonspecific abnormal finding of lung field: Secondary | ICD-10-CM | POA: Diagnosis not present

## 2021-06-26 DIAGNOSIS — C7951 Secondary malignant neoplasm of bone: Secondary | ICD-10-CM | POA: Diagnosis present

## 2021-06-26 DIAGNOSIS — N182 Chronic kidney disease, stage 2 (mild): Secondary | ICD-10-CM | POA: Diagnosis present

## 2021-06-26 DIAGNOSIS — D630 Anemia in neoplastic disease: Secondary | ICD-10-CM | POA: Diagnosis present

## 2021-06-26 DIAGNOSIS — C7952 Secondary malignant neoplasm of bone marrow: Secondary | ICD-10-CM | POA: Diagnosis present

## 2021-06-26 DIAGNOSIS — Z9079 Acquired absence of other genital organ(s): Secondary | ICD-10-CM | POA: Diagnosis not present

## 2021-06-26 DIAGNOSIS — Z8249 Family history of ischemic heart disease and other diseases of the circulatory system: Secondary | ICD-10-CM | POA: Diagnosis not present

## 2021-06-26 DIAGNOSIS — Z681 Body mass index (BMI) 19 or less, adult: Secondary | ICD-10-CM | POA: Diagnosis not present

## 2021-06-26 DIAGNOSIS — C801 Malignant (primary) neoplasm, unspecified: Secondary | ICD-10-CM

## 2021-06-26 DIAGNOSIS — C3412 Malignant neoplasm of upper lobe, left bronchus or lung: Secondary | ICD-10-CM | POA: Diagnosis not present

## 2021-06-26 DIAGNOSIS — I130 Hypertensive heart and chronic kidney disease with heart failure and stage 1 through stage 4 chronic kidney disease, or unspecified chronic kidney disease: Secondary | ICD-10-CM | POA: Diagnosis present

## 2021-06-26 DIAGNOSIS — I5022 Chronic systolic (congestive) heart failure: Secondary | ICD-10-CM | POA: Diagnosis present

## 2021-06-26 DIAGNOSIS — R5381 Other malaise: Secondary | ICD-10-CM | POA: Diagnosis present

## 2021-06-26 DIAGNOSIS — Z79899 Other long term (current) drug therapy: Secondary | ICD-10-CM | POA: Diagnosis not present

## 2021-06-26 DIAGNOSIS — I272 Pulmonary hypertension, unspecified: Secondary | ICD-10-CM | POA: Diagnosis present

## 2021-06-26 DIAGNOSIS — I2699 Other pulmonary embolism without acute cor pulmonale: Secondary | ICD-10-CM | POA: Diagnosis present

## 2021-06-26 DIAGNOSIS — Z20822 Contact with and (suspected) exposure to covid-19: Secondary | ICD-10-CM | POA: Diagnosis present

## 2021-06-26 DIAGNOSIS — R64 Cachexia: Secondary | ICD-10-CM | POA: Diagnosis present

## 2021-06-26 DIAGNOSIS — C3402 Malignant neoplasm of left main bronchus: Secondary | ICD-10-CM | POA: Diagnosis present

## 2021-06-26 DIAGNOSIS — N189 Chronic kidney disease, unspecified: Secondary | ICD-10-CM | POA: Diagnosis not present

## 2021-06-26 DIAGNOSIS — Z87891 Personal history of nicotine dependence: Secondary | ICD-10-CM | POA: Diagnosis not present

## 2021-06-26 DIAGNOSIS — G893 Neoplasm related pain (acute) (chronic): Secondary | ICD-10-CM | POA: Diagnosis present

## 2021-06-26 DIAGNOSIS — E8809 Other disorders of plasma-protein metabolism, not elsewhere classified: Secondary | ICD-10-CM | POA: Diagnosis present

## 2021-06-26 DIAGNOSIS — R0600 Dyspnea, unspecified: Secondary | ICD-10-CM | POA: Diagnosis present

## 2021-06-26 HISTORY — PX: VIDEO BRONCHOSCOPY: SHX5072

## 2021-06-26 HISTORY — PX: CRYOTHERAPY: SHX6894

## 2021-06-26 HISTORY — PX: HEMOSTASIS CONTROL: SHX6838

## 2021-06-26 LAB — BLOOD GAS, ARTERIAL
Acid-base deficit: 0.8 mmol/L (ref 0.0–2.0)
Bicarbonate: 21.9 mmol/L (ref 20.0–28.0)
Drawn by: 560031
O2 Saturation: 95.2 %
Patient temperature: 98.6
pCO2 arterial: 30.6 mmHg — ABNORMAL LOW (ref 32.0–48.0)
pH, Arterial: 7.469 — ABNORMAL HIGH (ref 7.350–7.450)
pO2, Arterial: 77.9 mmHg — ABNORMAL LOW (ref 83.0–108.0)

## 2021-06-26 LAB — CBC
HCT: 29.2 % — ABNORMAL LOW (ref 39.0–52.0)
Hemoglobin: 8.8 g/dL — ABNORMAL LOW (ref 13.0–17.0)
MCH: 25.8 pg — ABNORMAL LOW (ref 26.0–34.0)
MCHC: 30.1 g/dL (ref 30.0–36.0)
MCV: 85.6 fL (ref 80.0–100.0)
Platelets: 334 10*3/uL (ref 150–400)
RBC: 3.41 MIL/uL — ABNORMAL LOW (ref 4.22–5.81)
RDW: 14 % (ref 11.5–15.5)
WBC: 7.7 10*3/uL (ref 4.0–10.5)
nRBC: 0 % (ref 0.0–0.2)

## 2021-06-26 LAB — COMPREHENSIVE METABOLIC PANEL
ALT: 15 U/L (ref 0–44)
AST: 18 U/L (ref 15–41)
Albumin: 2.6 g/dL — ABNORMAL LOW (ref 3.5–5.0)
Alkaline Phosphatase: 98 U/L (ref 38–126)
Anion gap: 10 (ref 5–15)
BUN: 17 mg/dL (ref 8–23)
CO2: 25 mmol/L (ref 22–32)
Calcium: 12.1 mg/dL — ABNORMAL HIGH (ref 8.9–10.3)
Chloride: 106 mmol/L (ref 98–111)
Creatinine, Ser: 1.24 mg/dL (ref 0.61–1.24)
GFR, Estimated: 57 mL/min — ABNORMAL LOW (ref >60)
Glucose, Bld: 89 mg/dL (ref 70–99)
Potassium: 3.5 mmol/L (ref 3.5–5.1)
Sodium: 141 mmol/L (ref 135–145)
Total Bilirubin: 0.5 mg/dL (ref 0.3–1.2)
Total Protein: 7.8 g/dL (ref 6.5–8.1)

## 2021-06-26 LAB — PROTIME-INR
INR: 1.1 (ref 0.8–1.2)
Prothrombin Time: 13.9 seconds (ref 11.4–15.2)

## 2021-06-26 LAB — HEPARIN LEVEL (UNFRACTIONATED): Heparin Unfractionated: 0.15 IU/mL — ABNORMAL LOW (ref 0.30–0.70)

## 2021-06-26 SURGERY — VIDEO BRONCHOSCOPY WITHOUT FLUORO
Anesthesia: General | Laterality: Left

## 2021-06-26 MED ORDER — HEPARIN (PORCINE) 25000 UT/250ML-% IV SOLN
1300.0000 [IU]/h | INTRAVENOUS | Status: AC
  Administered 2021-06-26: 1200 [IU]/h via INTRAVENOUS
  Filled 2021-06-26: qty 250

## 2021-06-26 MED ORDER — SODIUM CHLORIDE 0.9 % IV SOLN: INTRAVENOUS | Status: DC

## 2021-06-26 MED ORDER — EPINEPHRINE PF 1 MG/ML IJ SOLN
INTRAMUSCULAR | Status: DC | PRN
  Administered 2021-06-26: 2 mL
  Administered 2021-06-26: 6 mL

## 2021-06-26 MED ORDER — ONDANSETRON HCL 4 MG/2ML IJ SOLN: 4.0000 mg | Freq: Once | INTRAMUSCULAR | Status: DC | PRN

## 2021-06-26 MED ORDER — ROCURONIUM BROMIDE 10 MG/ML (PF) SYRINGE
PREFILLED_SYRINGE | INTRAVENOUS | Status: DC | PRN
  Administered 2021-06-26: 50 mg via INTRAVENOUS

## 2021-06-26 MED ORDER — HEPARIN BOLUS VIA INFUSION
1500.0000 [IU] | Freq: Once | INTRAVENOUS | Status: AC
  Administered 2021-06-26: 1500 [IU] via INTRAVENOUS
  Filled 2021-06-26: qty 1500

## 2021-06-26 MED ORDER — FENTANYL CITRATE (PF) 250 MCG/5ML IJ SOLN
INTRAMUSCULAR | Status: DC | PRN
  Administered 2021-06-26: 100 ug via INTRAVENOUS

## 2021-06-26 MED ORDER — FENTANYL CITRATE (PF) 100 MCG/2ML IJ SOLN: 25.0000 ug | INTRAMUSCULAR | Status: DC | PRN

## 2021-06-26 MED ORDER — MIDAZOLAM HCL 5 MG/5ML IJ SOLN: INTRAMUSCULAR | Status: DC | PRN

## 2021-06-26 MED ORDER — LACTATED RINGERS IV SOLN: INTRAVENOUS | Status: DC | PRN

## 2021-06-26 MED ORDER — DEXAMETHASONE SODIUM PHOSPHATE 10 MG/ML IJ SOLN
INTRAMUSCULAR | Status: DC | PRN
  Administered 2021-06-26: 10 mg via INTRAVENOUS

## 2021-06-26 MED ORDER — LIDOCAINE 2% (20 MG/ML) 5 ML SYRINGE
INTRAMUSCULAR | Status: DC | PRN
  Administered 2021-06-26: 40 mg via INTRAVENOUS

## 2021-06-26 MED ORDER — ETOMIDATE 2 MG/ML IV SOLN
INTRAVENOUS | Status: DC | PRN
  Administered 2021-06-26: 18 mg via INTRAVENOUS

## 2021-06-26 MED ORDER — ONDANSETRON HCL 4 MG/2ML IJ SOLN
INTRAMUSCULAR | Status: DC | PRN
  Administered 2021-06-26: 4 mg via INTRAVENOUS

## 2021-06-26 MED ORDER — PHENYLEPHRINE HCL-NACL 10-0.9 MG/250ML-% IV SOLN
INTRAVENOUS | Status: DC | PRN
  Administered 2021-06-26: 50 ug/min via INTRAVENOUS

## 2021-06-26 MED ORDER — FLUTICASONE FUROATE-VILANTEROL 100-25 MCG/INH IN AEPB
1.0000 | INHALATION_SPRAY | Freq: Every day | RESPIRATORY_TRACT | Status: DC
  Administered 2021-06-26 – 2021-06-28 (×2): 1 via RESPIRATORY_TRACT
  Filled 2021-06-26 (×2): qty 28

## 2021-06-26 MED ORDER — ALBUMIN HUMAN 5 % IV SOLN: INTRAVENOUS | Status: DC | PRN

## 2021-06-26 MED ORDER — EPINEPHRINE 1 MG/10ML IJ SOSY
PREFILLED_SYRINGE | INTRAMUSCULAR | Status: AC
  Filled 2021-06-26: qty 10

## 2021-06-26 MED ORDER — SUGAMMADEX SODIUM 200 MG/2ML IV SOLN
INTRAVENOUS | Status: DC | PRN
  Administered 2021-06-26: 120 mg via INTRAVENOUS

## 2021-06-26 MED ORDER — ALLOPURINOL 100 MG PO TABS
100.0000 mg | ORAL_TABLET | Freq: Every day | ORAL | Status: DC
  Administered 2021-06-26 – 2021-06-28 (×3): 100 mg via ORAL
  Filled 2021-06-26 (×3): qty 1

## 2021-06-26 MED ORDER — UMECLIDINIUM BROMIDE 62.5 MCG/INH IN AEPB
1.0000 | INHALATION_SPRAY | Freq: Every day | RESPIRATORY_TRACT | Status: DC
  Administered 2021-06-26 – 2021-06-28 (×2): 1 via RESPIRATORY_TRACT
  Filled 2021-06-26 (×2): qty 7

## 2021-06-26 SURGICAL SUPPLY — 29 items
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
## 2021-06-26 NOTE — ED Notes (Signed)
Medical oncology PA in to see the patient.. Heparin drip has been stopped per Dr Dwyane Dee orders

## 2021-06-26 NOTE — ED Notes (Signed)
Patient assisted with using the urinal.

## 2021-06-26 NOTE — Progress Notes (Signed)
[facility] for heparin Indication: suspected pulmonary embolus  No Known Allergies  Patient Measurements: Height: 5\' 9"  (175.3 cm) Weight: 53.5 kg (118 lb) IBW/kg (Calculated) : 70.7 Heparin Dosing Weight: 53 kg  Vital Signs: BP: 124/100 (07/26 0330) Pulse Rate: 101 (07/26 0330)  Labs: Recent Labs    06/25/21 1212 06/25/21 1449 06/26/21 0343  HGB 9.4*  --   --   HCT 31.4*  --   --   PLT 310  --   --   HEPARINUNFRC  --   --  0.15*  CREATININE 1.01  --   --   TROPONINIHS 30* 31*  --      Estimated Creatinine Clearance: 41.2 mL/min (by C-G formula based on SCr of 1.01 mg/dL).   Assessment: Patient is an 87 y.o M presented to the ED on 7/25 with c/o SOB and hemoptysis. Chest CT on 7/25 showed lung mass and "probable embolus involving left upper lobe pulmonary artery Branches." Pharmacy has been consulted to dose heparin drip for PE.  06/26/2021 HL 0.15 subtherapeutic on 1000 units/hr Per RN no bleeding or line interruptions   Goal of Therapy:  Heparin level 0.3-0.7 units/ml Monitor platelets by anticoagulation protocol: Yes   Plan:  - heparin 1500 units IV x1 bolus, then increase drip to 1200 units/hr - check 8 hr heparin level - monitor for s/sx bleeding  Dolly Rias RPh 06/26/2021, 4:23 AM
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
## 2021-06-26 NOTE — ED Notes (Signed)
Patient arouses easily when checked- patient does appear very weak when attempting to move about in his bed.  Continuous cardiac monitor in place.

## 2021-06-26 NOTE — Op Note (Addendum)
Video Bronchoscopy, endobronchial cryo biopsies, cryotherapy, tumor debulking, lesional excision Procedure Note  Date of Operation: 06/26/2021  Pre-op Diagnosis: Left lung mass  Post-op Diagnosis: Left lung mass, endobronchial obstruction  Surgeon: Garner Nash, DO  Assistants: none  Anesthesia: General  Operation: Flexible video fiberoptic bronchoscopy and biopsies.  Estimated Blood Loss: <0ZS  Complications: none noted  Indications and History: Robert Hamilton is 87 y.o. with history of left lung mass, CT scan with left mainstem obstruction.  Recommendation was to perform video fiberoptic bronchoscopy with biopsies. The risks, benefits, complications, treatment options and expected outcomes were discussed with the patient.  The possibilities of pneumothorax, pneumonia, reaction to medication, pulmonary aspiration, perforation of a viscus, bleeding, failure to diagnose a condition and creating a complication requiring transfusion or operation were discussed with the patient who freely signed the consent.    Description of Procedure: The patient was seen in the Preoperative Area, was examined and was deemed appropriate to proceed.  The patient was taken to [facility], identified as Robert Hamilton and the procedure verified as Flexible Video Fiberoptic Bronchoscopy.  A Time Out was held and the above information confirmed.   Standard therapeutic bronchoscope was inserted through the patient's endotracheal tube that was placed by anesthesia.  The R sided airways were inspected and showed normal RUL, BI, RML and RLL.  Right-sided lung anatomy appeared normal on examination.  The left mainstem was fully obstructed by tumor.  Please see image below.  Prior to taking biopsies and tumor debulking with cryotherapy the left mainstem tumor was coated with 1: 10,000 dilution of epinephrine.  Once the tissue blanched we started with a 2.4 mm cryotherapy probe for removal of tumor and  endobronchial biopsies.  We completed several biopsies and removal of tumor with freezing of the tissue and en bloc retraction.  We were successful at tumor debulking and removal of nearly 90+ percent of the tumor.  We fully open to the left mainstem revealing a normal-appearing left lower lobe opening as well as a normal-appearing left upper lobe apical division.  The lingula appears to be the location in which the tumor arises from.  We used cryotherapy to freeze the base of the tumor coming from the most proximal portion of the lingula.  The patient tolerated the procedure well.  At the end of the procedure the standard therapeutic bronchoscope was used for aspiration of any remaining blood clots and debris.  With removal of the left mainstem tumor we did find a significant amount of old bloody secretions clogging of the left upper lobe segments which were suctioned and aspirated free.  All distal subsegments were patent at the termination of the procedure and there was no evidence of active bleeding.  The bronchoscope was removed from the patient's airway.  Samples: 1.  Endobronchial biopsies  Plans:  We will review the cytology, pathology and microbiology results with the patient when they become available.  Outpatient followup will be with Dr. Valeta Harms.    Garner Nash, DO [place] 06/26/2021 5:19 PM     Left mainstem Tumor:   Left Mainstem After Tumor Removal:

## 2021-06-26 NOTE — Progress Notes (Signed)
PCCM:  Case discussed with Dr. Tammi Klippel from Radiation Oncology as well as Dr. Sloan Leiter. Pulmonary is able to offer bronchoscopy this afternoon at [facility] for obstructed left mainstem.   Dr. Chase Caller at [facility] will see in pre-op consultation. I will see here at [facility] once transferred.   Garner Nash, DO [place] [facility] 06/26/2021 10:11 AM
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
## 2021-06-26 NOTE — Discharge Instructions (Signed)
Flexible Bronchoscopy, Care After This sheet gives you information about how to care for yourself after your test. Your doctor may also give you more specific instructions. If you have problems or questions, contact your doctor. Follow these instructions at home: Eating and drinking Do not eat or drink anything (not even water) for 2 hours after your test, or until your numbing medicine (local anesthetic) wears off. When your numbness is gone and your cough and gag reflexes have come back, you may: Eat only soft foods. Slowly drink liquids. The day after the test, go back to your normal diet. Driving Do not drive for 24 hours if you were given a medicine to help you relax (sedative). Do not drive or use heavy machinery while taking prescription pain medicine. General instructions  Take over-the-counter and prescription medicines only as told by your doctor. Return to your normal activities as told. Ask what activities are safe for you. Do not use any products that have nicotine or tobacco in them. This includes cigarettes and e-cigarettes. If you need help quitting, ask your doctor. Keep all follow-up visits as told by your doctor. This is important. It is very important if you had a tissue sample (biopsy) taken. Get help right away if: You have shortness of breath that gets worse. You get light-headed. You feel like you are going to pass out (faint). You have chest pain. You cough up: More than a little blood. More blood than before. Summary Do not eat or drink anything (not even water) for 2 hours after your test, or until your numbing medicine wears off. Do not use cigarettes. Do not use e-cigarettes. Get help right away if you have chest pain.  This information is not intended to replace advice given to you by your health care provider. Make sure you discuss any questions you have with your health care provider. Document Released: 09/15/2009 Document Revised: 10/31/2017 Document  Reviewed: 12/06/2016 Elsevier Patient Education  2020 [employer].
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
## 2021-06-26 NOTE — Anesthesia Postprocedure Evaluation (Signed)
Anesthesia Post Note  Patient: Robert Hamilton  Procedure(s) Performed: VIDEO BRONCHOSCOPY WITHOUT FLUORO (Left) CRYOTHERAPY HEMOSTASIS CONTROL     Patient location during evaluation: PACU Anesthesia Type: General Level of consciousness: awake and alert Pain management: pain level controlled Vital Signs Assessment: post-procedure vital signs reviewed and stable Respiratory status: spontaneous breathing, nonlabored ventilation and respiratory function stable Cardiovascular status: blood pressure returned to baseline and stable Postop Assessment: no apparent nausea or vomiting Anesthetic complications: no   No notable events documented.  Last Vitals:  Vitals:   06/26/21 1200 06/26/21 1332  BP: 104/81 122/73  Pulse:    Resp: 20 (!) 21  Temp:  36.6 C  SpO2: 97% 99%    Last Pain:  Vitals:   06/26/21 1332  TempSrc: Oral  PainSc: 0-No pain                 Kishia Shackett A.
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
## 2021-06-26 NOTE — Transfer of Care (Signed)
Immediate Anesthesia Transfer of Care Note  Patient: Robert Hamilton  Procedure(s) Performed: VIDEO BRONCHOSCOPY WITHOUT FLUORO (Left) CRYOTHERAPY HEMOSTASIS CONTROL  Patient Location: PACU  Anesthesia Type:General  Level of Consciousness: alert  and oriented  Airway & Oxygen Therapy: Patient Spontanous Breathing and Patient connected to nasal cannula oxygen  Post-op Assessment: Report given to RN and Post -op Vital signs reviewed and stable  Post vital signs: Reviewed and stable  Last Vitals:  Vitals Value Taken Time  BP 158/99 06/26/21 1729  Temp    Pulse    Resp 19 06/26/21 1731  SpO2    Vitals shown include unvalidated device data.  Last Pain:  Vitals:   06/26/21 1332  TempSrc: Oral  PainSc: 0-No pain         Complications: No notable events documented.

## 2021-06-26 NOTE — ED Notes (Signed)
Eduard Clos, RN was sent a secure message in regards to the patient having an admission bed assignment 1620.  The patient is currently at [facility] for Bronchoscopy and lung biopsy

## 2021-06-26 NOTE — ED Notes (Signed)
Patient transferred to [facility] via [facility] for bronchoscopy and lung biospy.  The patient will be admitted to the hospital after procedure.  Patient belongings were sent with the patient, clothing, wallet, shoes

## 2021-06-26 NOTE — Progress Notes (Signed)
[place] for heparin Indication: suspected pulmonary embolus  No Known Allergies  Patient Measurements: Height: 5\' 9"  (175.3 cm) Weight: 53.5 kg (118 lb) IBW/kg (Calculated) : 70.7 Heparin Dosing Weight: 53 kg  Vital Signs: Temp: 97.3 F (36.3 C) (07/26 1730) Temp Source: Oral (07/26 1332) BP: 130/83 (07/26 1830) Pulse Rate: 82 (07/26 1815)  Labs: Recent Labs    06/25/21 1212 06/25/21 1449 06/26/21 0343 06/26/21 0525  HGB 9.4*  --   --  8.8*  HCT 31.4*  --   --  29.2*  PLT 310  --   --  334  LABPROT  --   --   --  13.9  INR  --   --   --  1.1  HEPARINUNFRC  --   --  0.15*  --   CREATININE 1.01  --   --  1.24  TROPONINIHS 30* 31*  --   --      Estimated Creatinine Clearance: 33.6 mL/min (by C-G formula based on SCr of 1.24 mg/dL).   Assessment: Patient is an 87 y.o M presented to the ED on 7/25 with c/o SOB and hemoptysis. Chest CT on 7/25 showed lung mass and "probable embolus involving left upper lobe pulmonary artery Branches." Pharmacy has been consulted to dose heparin drip for PE.  06/26/2021 after Flexible video fiberoptic bronchoscopy and biopsies performed at [facility] today then transferred back to [facility] Per Dr. Valeta Harms, safe to resume IV heparin with no bolus with goal of heparin level to gradually rise to goal   Goal of Therapy:  Heparin level 0.3-0.7 units/ml Monitor platelets by anticoagulation protocol: Yes   Plan:  Resume IV heparin at previous rate of 1200 units/hr Check 8 hr heparin level Monitor for s/sx bleeding   Adrian Saran, PharmD, BCPS Secure Chat if ?s 06/26/2021 7:20 PM

## 2021-06-26 NOTE — Anesthesia Preprocedure Evaluation (Addendum)
Anesthesia Evaluation  Patient identified by MRN, date of birth, ID band Patient awake    Reviewed: Allergy & Precautions, NPO status , Patient's Chart, lab work & pertinent test results, reviewed documented beta blocker date and time   Airway Mallampati: II  TM Distance: >3 FB Neck ROM: Full    Dental  (+) Edentulous Upper, Dental Advisory Given   Pulmonary COPD,  COPD inhaler, former smoker,  Lung mass: known to have a lung mass in the left hilum with lytic bone lesions in June 2022.  He might of had a PET scan in [place] in June 2022 and if so results are not available no biopsy has been performed.  Appointment with oncology in [place] was pending.  However in the interim he has developed 1 episode of hemoptysis and also progressive shortness of breath and chronic cough with mucus  Repeat CT scan today done in the emergency room shows 6 cm left hilar vascular lesion, left UL lobe pulmonary embolism with no right heart strain, large lytic lesion on the right iliac crest extending to the acetabulum.  IV heparin infusion had been started but held for bronch   Pulmonary exam normal breath sounds clear to auscultation       Cardiovascular hypertension, Pt. on medications and Pt. on home beta blockers pulmonary hypertension (mild pHTN)+CHF (LVEF 75-91%, grade 2 diastolic dysfunction)  Normal cardiovascular exam+ Valvular Problems/Murmurs (severe MR, severe TR) MR  Rhythm:Regular Rate:Normal  May 2022 echocardiogram ejection fraction 35-40% with moderate to severe mitral and tricuspid regurgitation, grade 2 diastolic dysfunction and mild pulmonary hypertension with RV systolic pressure 41   Neuro/Psych negative neurological ROS  negative psych ROS   GI/Hepatic Neg liver ROS, GERD  Medicated and Controlled,  Endo/Other  negative endocrine ROSHypercalcemia  Renal/GU Renal InsufficiencyRenal diseaseCr 1.24  negative genitourinary    Musculoskeletal negative musculoskeletal ROS (+)   Abdominal   Peds  Hematology  (+) Blood dyscrasia, anemia , 8.8/29.2   Anesthesia Other Findings   Reproductive/Obstetrics negative OB ROS                           Anesthesia Physical Anesthesia Plan  ASA: 4  Anesthesia Plan: General   Post-op Pain Management:    Induction: Intravenous  PONV Risk Score and Plan: Ondansetron and Treatment may vary due to age or medical condition  Airway Management Planned: Oral ETT  Additional Equipment: None  Intra-op Plan:   Post-operative Plan: Extubation in OR  Informed Consent: I have reviewed the patients History and Physical, chart, labs and discussed the procedure including the risks, benefits and alternatives for the proposed anesthesia with the patient or authorized representative who has indicated his/her understanding and acceptance.     Dental advisory given  Plan Discussed with: CRNA  Anesthesia Plan Comments: (Full code per pt)       Anesthesia Quick Evaluation

## 2021-06-26 NOTE — Interval H&P Note (Signed)
History and Physical Interval Note:  06/26/2021 5:16 PM  Robert Hamilton  has presented today for surgery, with the diagnosis of lung mass.  The various methods of treatment have been discussed with the patient and family. After consideration of risks, benefits and other options for treatment, the patient has consented to  Procedure(s) with comments: VIDEO BRONCHOSCOPY WITHOUT FLUORO (Left) - possible cryotherapy CRYOTHERAPY HEMOSTASIS CONTROL as a surgical intervention.  The patient's history has been reviewed, patient examined, no change in status, stable for surgery.  I have reviewed the patient's chart and labs.  Questions were answered to the patient's satisfaction.     [place]

## 2021-06-26 NOTE — Anesthesia Procedure Notes (Signed)
Procedure Name: Intubation Date/Time: 06/26/2021 4:00 PM Performed by: Jenne Campus, CRNA Pre-anesthesia Checklist: Suction available and Emergency Drugs available Patient Re-evaluated:Patient Re-evaluated prior to induction Oxygen Delivery Method: Circle system utilized Preoxygenation: Pre-oxygenation with 100% oxygen Induction Type: IV induction Ventilation: Mask ventilation without difficulty Laryngoscope Size: Miller and 2 Grade View: Grade I Tube type: Oral Tube size: 8.5 mm Number of attempts: 1 Airway Equipment and Method: Stylet

## 2021-06-27 ENCOUNTER — Ambulatory Visit
Admit: 2021-06-27 | Discharge: 2021-06-27 | Disposition: A | Discharge status: Home / Self Care | Payer: Medicare Other | Attending: Radiation Oncology | Admitting: Radiation Oncology

## 2021-06-27 ENCOUNTER — Ambulatory Visit: Payer: Medicare Other | Admitting: Radiation Oncology

## 2021-06-27 DIAGNOSIS — G893 Neoplasm related pain (acute) (chronic): Secondary | ICD-10-CM | POA: Diagnosis not present

## 2021-06-27 DIAGNOSIS — C3492 Malignant neoplasm of unspecified part of left bronchus or lung: Secondary | ICD-10-CM

## 2021-06-27 DIAGNOSIS — E43 Unspecified severe protein-calorie malnutrition: Secondary | ICD-10-CM | POA: Insufficient documentation

## 2021-06-27 DIAGNOSIS — C7951 Secondary malignant neoplasm of bone: Secondary | ICD-10-CM | POA: Diagnosis not present

## 2021-06-27 DIAGNOSIS — C801 Malignant (primary) neoplasm, unspecified: Secondary | ICD-10-CM | POA: Diagnosis not present

## 2021-06-27 LAB — BASIC METABOLIC PANEL
Anion gap: 15 (ref 5–15)
BUN: 19 mg/dL (ref 8–23)
CO2: 23 mmol/L (ref 22–32)
Calcium: 11.9 mg/dL — ABNORMAL HIGH (ref 8.9–10.3)
Chloride: 107 mmol/L (ref 98–111)
Creatinine, Ser: 1.07 mg/dL (ref 0.61–1.24)
GFR, Estimated: >60 mL/min (ref >60)
Glucose, Bld: 76 mg/dL (ref 70–99)
Potassium: 3.7 mmol/L (ref 3.5–5.1)
Sodium: 145 mmol/L (ref 135–145)

## 2021-06-27 LAB — HEPARIN LEVEL (UNFRACTIONATED): Heparin Unfractionated: 0.22 IU/mL — ABNORMAL LOW (ref 0.30–0.70)

## 2021-06-27 MED ORDER — RESOURCE INSTANT PROTEIN PO PWD PACKET
1.0000 | Freq: Three times a day (TID) | ORAL | Status: DC
  Administered 2021-06-27 – 2021-06-28 (×3): 6 g via ORAL
  Filled 2021-06-27 (×4): qty 6

## 2021-06-27 MED ORDER — ADULT MULTIVITAMIN W/MINERALS CH
1.0000 | ORAL_TABLET | Freq: Every day | ORAL | Status: DC
  Administered 2021-06-27 – 2021-06-28 (×2): 1 via ORAL
  Filled 2021-06-27 (×2): qty 1

## 2021-06-27 MED ORDER — ENOXAPARIN SODIUM 60 MG/0.6ML IJ SOSY
55.0000 mg | PREFILLED_SYRINGE | Freq: Two times a day (BID) | INTRAMUSCULAR | Status: DC
  Administered 2021-06-27 – 2021-06-28 (×2): 55 mg via SUBCUTANEOUS
  Filled 2021-06-27 (×3): qty 0.6

## 2021-06-27 MED ORDER — ZOLEDRONIC ACID 4 MG/5ML IV CONC
3.0000 mg | Freq: Once | INTRAVENOUS | Status: AC
  Administered 2021-06-27: 3 mg via INTRAVENOUS
  Filled 2021-06-27: qty 3.75

## 2021-06-27 MED ORDER — BOOST / RESOURCE BREEZE PO LIQD CUSTOM
1.0000 | Freq: Three times a day (TID) | ORAL | Status: DC
  Administered 2021-06-27 – 2021-06-28 (×4): 1 via ORAL

## 2021-06-27 MED ORDER — ZOLEDRONIC ACID 4 MG/5ML IV CONC: 4.0000 mg | Freq: Once | INTRAVENOUS | Status: DC
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
## 2021-06-27 NOTE — Progress Notes (Signed)
Initial Nutrition Assessment  DOCUMENTATION CODES:   Severe malnutrition in context of chronic illness, Underweight  INTERVENTION:   -Boost Breeze po TID, each supplement provides 250 kcal and 9 grams of protein  -Beneprotein powder TID with meals, each provides 25 kcals and 6g protein  -Daily snack  -Multivitamin with minerals daily  NUTRITION DIAGNOSIS:   Severe Malnutrition related to chronic illness, cancer and cancer related treatments as evidenced by percent weight loss, energy intake < or equal to 75% for > or equal to 1 month, severe fat depletion, severe muscle depletion.  GOAL:   Patient will meet greater than or equal to 90% of their needs  MONITOR:   PO intake, Supplement acceptance, Labs, Weight trends, I & O's  REASON FOR ASSESSMENT:   Consult Assessment of nutrition requirement/status  ASSESSMENT:   87 y.o. male with medical history significant of hypertension, chronic kidney disease stage II, history of smoking and alcohol use, chronic systolic congestive heart failure and history of diverticular bleed and 4/20 who was recently diagnosed with lung tumor and right iliac crest lytic lesion presents to the emergency room with ongoing shortness of breath, intermittent hemoptysis and right hip pain and unable to walk.  Patient reports poor appetite but unable to state how long. Pt ate some sausage and eggs this morning, feels hungry today. Pt denies issues with taste, chewing or swallowing. Will order daily snack and Boost Breeze for additional kcals and protein.  Per weight records, pt has lost 16 lbs  since 5/18 (11% wt loss x 2 months, significant for time frame).   Medications: Colace, Folic acid, Lasix, Thiamine  Labs reviewed.  NUTRITION - FOCUSED PHYSICAL EXAM:  Flowsheet Row Most Recent Value  Orbital Region Moderate depletion  Upper Arm Region Severe depletion  Thoracic and Lumbar Region Unable to assess  Buccal Region Moderate depletion   Temple Region Moderate depletion  Clavicle Bone Region Severe depletion  Clavicle and Acromion Bone Region Severe depletion  Scapular Bone Region Severe depletion  Dorsal Hand Moderate depletion  Patellar Region Unable to assess  Anterior Thigh Region Unable to assess  Posterior Calf Region Severe depletion  Edema (RD Assessment) None  Hair Reviewed  Eyes Reviewed  Mouth Reviewed  Skin Reviewed       Diet Order:   Diet Order             Diet regular Room service appropriate? Yes; Fluid consistency: Thin  Diet effective now                   EDUCATION NEEDS:   No education needs have been identified at this time  Skin:  Skin Assessment: Reviewed RN Assessment  Last BM:  PTA  Height:   Ht Readings from Last 1 Encounters:  06/25/21 5\' 9"  (1.753 m)    Weight:   Wt Readings from Last 1 Encounters:  06/27/21 55.2 kg    BMI:  Body mass index is 17.97 kg/m.  Estimated Nutritional Needs:   Kcal:  1850-2050  Protein:  105-115g  Fluid:  2L/day   Clayton Bibles, MS, RD, LDN Inpatient Clinical Dietitian Contact information available via Amion

## 2021-06-27 NOTE — Progress Notes (Signed)
ANTICOAGULATION CONSULT NOTE   Pharmacy Consult for heparin Indication: suspected pulmonary embolus  No Known Allergies  Patient Measurements: Height: 5\' 9"  (175.3 cm) Weight: 55.2 kg (121 lb 11.1 oz) IBW/kg (Calculated) : 70.7 Heparin Dosing Weight: 53 kg  Vital Signs: Temp: 98.2 F (36.8 C) (07/27 0326) Temp Source: Oral (07/27 0326) BP: 116/69 (07/27 0326) Pulse Rate: 76 (07/27 0328)  Labs: Recent Labs    06/25/21 1212 06/25/21 1449 06/26/21 0343 06/26/21 0525 06/27/21 0550  HGB 9.4*  --   --  8.8*  --   HCT 31.4*  --   --  29.2*  --   PLT 310  --   --  334  --   LABPROT  --   --   --  13.9  --   INR  --   --   --  1.1  --   HEPARINUNFRC  --   --  0.15*  --  0.22*  CREATININE 1.01  --   --  1.24  --   TROPONINIHS 30* 31*  --   --   --      Estimated Creatinine Clearance: 34.6 mL/min (by C-G formula based on SCr of 1.24 mg/dL).   Assessment: Patient is an 87 y.o M presented to the ED on 7/25 with c/o SOB and hemoptysis. Chest CT on 7/25 showed lung mass and "probable embolus involving left upper lobe pulmonary artery Branches." Pharmacy has been consulted to dose heparin drip for PE.  7/26: after Flexible video fiberoptic bronchoscopy and biopsies performed at [facility] today then transferred back to [facility] Per Dr. Valeta Harms, safe to resume IV heparin with no bolus with goal of heparin level to gradually rise to goal  06/27/2021 HL 0.22 subtherapeutic No bleeding reported  Goal of Therapy:  Heparin level 0.3-0.7 units/ml Monitor platelets by anticoagulation protocol: Yes   Plan:  Increase heparin to 1300 units/hr Check 8 hr heparin level Monitor for s/sx bleeding   Dolly Rias RPh 06/27/2021, 6:36 AM

## 2021-06-27 NOTE — Evaluation (Signed)
Physical Therapy Evaluation Patient Details Name: Robert Hamilton MRN: [id number] DOB: 1934/02/06 Today's Date: 06/27/2021   History of Present Illness  Robert Hamilton is a 87 y.o. male  presents to the emergency room with ongoing shortness of breath, intermittent hemoptysis and right hip pain and unable to walk. CT scan: 6 cm left hilar vascular lesion, left UL lobe pulmonary embolism with no right heart strain, large lytic lesion on the right iliac crest extending to the acetabulum. PMH: HTN, CKD stage II, history of smoking and alcohol use, chronic systolic CHF, history of diverticular bleed and was recently diagnosed with lung tumor and right iliac crest lytic lesion  Clinical Impression  Pt admitted with above diagnosis. Pt independent at baseline, using SPC in the community as needed, completes ADLs/IADLs and daughter lives close by. Pt currently requires mod A to get to EOB due to R hip pain and weakness. Pt ambulates in room 30 ft with RW, increased UE weight-bearing on RW when lifting L foot, good steadiness. Pt on 3L O2 with SpO2 >92% with ambulation. Educated pt on time OOB, as well as acute PT and HHPT and pt in agreement with plan. Pt currently with functional limitations due to the deficits listed below (see PT Problem List). Pt will benefit from skilled PT to increase their independence and safety with mobility to allow discharge to the venue listed below.       Follow Up Recommendations Home health PT    Equipment Recommendations  Rolling walker with 5" wheels    Recommendations for Other Services       Precautions / Restrictions Precautions Precautions: Fall Restrictions Weight Bearing Restrictions: No      Mobility  Bed Mobility Overal bed mobility: Needs Assistance Bed Mobility: Supine to Sit  Supine to sit: Mod assist  General bed mobility comments: mod A for mobilizing R LE to EOB and to upright trunk into sitting    Transfers Overall transfer level:  Needs assistance Equipment used: Rolling walker (2 wheeled) Transfers: Sit to/from Stand Sit to Stand: Min guard  General transfer comment: VCs for hand placement to power to stand and return to sitting, good steadiness upon rising  Ambulation/Gait Ambulation/Gait assistance: Min guard Gait Distance (Feet): 30 Feet Assistive device: Rolling walker (2 wheeled) Gait Pattern/deviations: Step-to pattern;Decreased stride length Gait velocity: decreased   General Gait Details: step to pattern with increased UE weight-bearing when lifting L foot to protect R hip, good steadiness, limited by fatigue, dyspnea 2/4  Stairs            Wheelchair Mobility    Modified Rankin (Stroke Patients Only)       Balance Overall balance assessment: Needs assistance Sitting-balance support: Feet supported Sitting balance-Leahy Scale: Good Sitting balance - Comments: seated EOB   Standing balance support: During functional activity Standing balance-Leahy Scale: Fair Standing balance comment: static without RW, using RW to decrease RW when ambulation       Pertinent Vitals/Pain Pain Assessment: Faces Faces Pain Scale: Hurts a little bit Pain Location: R hip with weight-bearing Pain Descriptors / Indicators: Sore Pain Intervention(s): Limited activity within patient's tolerance;Monitored during session;Premedicated before session;Repositioned    Home Living Family/patient expects to be discharged to:: Private residence Living Arrangements: Alone Available Help at Discharge: Family;Available PRN/intermittently Type of Home: Apartment Home Access: Level entry     Home Layout: One level Home Equipment: Cane - single point;Hand held shower head      Prior Function Level of Independence: Independent  Comments: Pt reports independent with household and community ambulation using SPC, independent with ADLs/IADLs, drives, has daughter ~1 mile away if needed.     Hand Dominance         Extremity/Trunk Assessment   Upper Extremity Assessment Upper Extremity Assessment: Overall WFL for tasks assessed    Lower Extremity Assessment Lower Extremity Assessment: RLE deficits/detail RLE Deficits / Details: AROM WNL, strength grossly 4-/5, hip abduction 2-/5 in supine    Cervical / Trunk Assessment Cervical / Trunk Assessment: Normal  Communication   Communication: No difficulties  Cognition Arousal/Alertness: Awake/alert Behavior During Therapy: WFL for tasks assessed/performed Overall Cognitive Status: Within Functional Limits for tasks assessed         General Comments General comments (skin integrity, edema, etc.): Pt on 3L O2 with SpO2 >92% during eval    Exercises     Assessment/Plan    PT Assessment Patient needs continued PT services  PT Problem List Decreased strength;Decreased activity tolerance;Decreased balance;Decreased mobility;Decreased knowledge of use of DME;Cardiopulmonary status limiting activity;Pain       PT Treatment Interventions DME instruction;Gait training;Functional mobility training;Therapeutic activities;Therapeutic exercise;Balance training;Patient/family education    PT Goals (Current goals can be found in the Care Plan section)  Acute Rehab PT Goals Patient Stated Goal: regain independence PT Goal Formulation: With patient/family Time For Goal Achievement: 07/11/21 Potential to Achieve Goals: Good    Frequency Min 3X/week   Barriers to discharge        Co-evaluation               AM-PAC PT "6 Clicks" Mobility  Outcome Measure Help needed turning from your back to your side while in a flat bed without using bedrails?: A Little Help needed moving from lying on your back to sitting on the side of a flat bed without using bedrails?: A Lot Help needed moving to and from a bed to a chair (including a wheelchair)?: A Little Help needed standing up from a chair using your arms (e.g., wheelchair or bedside chair)?: A  Little Help needed to walk in hospital room?: A Little Help needed climbing 3-5 steps with a railing? : A Little 6 Click Score: 17    End of Session Equipment Utilized During Treatment: Gait belt;Oxygen Activity Tolerance: Patient tolerated treatment well Patient left: in chair;with call bell/phone within reach;with chair alarm set Nurse Communication: Mobility status PT Visit Diagnosis: Other abnormalities of gait and mobility (R26.89);Pain Pain - Right/Left: Left Pain - part of body: Hip    Time: 1347-1405 PT Time Calculation (min) (ACUTE ONLY): 18 min   Charges:   PT Evaluation $PT Eval Low Complexity: 1 Low           Tori Katya Rolston PT, DPT 06/27/21, 2:18 PM

## 2021-06-27 NOTE — Progress Notes (Signed)
S/P bx by Dr Valeta Harms yesterday. PCCM will be available as needed. Primary team and onc to follow on bx results    SIGNATURE    Dr. Brand Males, M.D., F.C.C.P,  Pulmonary and Critical Care Medicine Staff Physician, [place] Director - Interstitial Lung Disease  Program  Pulmonary [address] [facility] [place], [place], [address]  NPI Number:  NPI #[id number]  Pager: [phone], If no answer  -> Check AMION or Try [place] Telephone (clinical office): [phone] Telephone (research): [phone]  8:06 AM 06/27/2021

## 2021-06-27 NOTE — Progress Notes (Signed)
PROGRESS NOTE    Robert Hamilton  MLY:[id number] DOB: 1933-12-05 DOA: 06/25/2021 PCP: Maryella Shivers, MD    Brief Narrative:  27 gentleman recently diagnosed left lung cancer and right iliac crest lesion came to the emergency room as he was not able to walk.  In addition to already known tumor and work-up in progress he was found to have left upper lobe PE.  Admitted because he was not able to bear weight on right hip due to pain.   Assessment & Plan:   Principal Problem:   Pain due to malignant neoplasm metastatic to bone [facility]) Active Problems:   GERD (gastroesophageal reflux disease)   Essential hypertension   Chronic kidney disease   Pulmonary embolism (HCC)   Hypercalcemia of malignancy   Endobronchial cancer, left (HCC)   Protein-calorie malnutrition, severe  Pain due to malignant neoplasm metastatic to bone: Suspected small cell lung cancer.  hypercalcemia of malignancy. Pain medication regimen with laxatives.  Mobilized today with PT OT to evaluate appropriateness for discharge home. On isotonic fluid.  Calcium elevated.  1 dose of Zometa today.  Recheck tomorrow.  Metastatic lung cancer: Clinically suspect small cell lung cancer.   Underwent bronchoscopy, tumor debulking and biopsy by Dr. Valeta Harms 7/26.  Pathology pending.  Bone biopsy not necessary.   Will need palliative radiation treatment to left lung and right ilium for pain relief, scheduled at [facility].  Pulmonary embolism: More likely due to metastatic cancer.  On heparin.  No more invasive procedures needed.  Will change to Lovenox and ultimately Eliquis on discharge.  Severe protein calorie malnutrition: Dietitian to see.  Augment nutrition as able.  Chronic kidney disease stage II: Fairly stable at baseline..  Essential hypertension: Fairly stable on Entresto and metoprolol.  Chronic congestive heart failure: Known chronic systolic congestive heart failure.  Euvolemic.  On Entresto and  beta-blockers.  Work with PT OT.  Find appropriate pain regimen today for pain relief.  Discussed with daughter who is able to take him to radiation treatment and cancer treatment to [facility] cancer center every day. Hopefully we can discharge him with home health PT OT and follow-up at cancer center.   DVT prophylaxis:   Lovenox subcu.   Code Status: Full code  Family Communication: Patient's daughter on the phone Disposition Plan: Status is: Inpatient.   Dispo: The patient is from: Home              Anticipated d/c is to: Home with home health.              Patient currently is not medically stable to d/c.   Difficult to place patient No         Consultants:  Medical oncology Radiation oncology Interventional radiology Pulmonary  Procedures:  Bronc and biopsy 7/26.  Antimicrobials:  None   Subjective: Patient seen and examined in the morning rounds.  No overnight events.  He had not eaten since yesterday morning, however does not feel hungry.  Appetite is poor.  He is not sure how much it will hurt to walk.  Objective: Vitals:   06/27/21 0326 06/27/21 0328 06/27/21 0500 06/27/21 0940  BP: 116/69   111/63  Pulse: 74 76  80  Resp: 15   18  Temp: 98.2 F (36.8 C)   97.9 F (36.6 C)  TempSrc: Oral   Oral  SpO2:  100%  100%  Weight:   55.2 kg   Height:        Intake/Output Summary (  Last 24 hours) at 06/27/2021 1517 Last data filed at 06/27/2021 1222 Gross per 24 hour  Intake 1798.69 ml  Output 450 ml  Net 1348.69 ml   Filed Weights   06/25/21 1156 06/27/21 0500  Weight: 53.5 kg 55.2 kg    Examination: General: Frail and debilitated.  Cachectic.  Temporal wasting.  2 L oxygen.  Looks comfortable at rest. Cardiovascular: S1-S2 normal. Respiratory: No added sounds.  Bilateral clear. Gastrointestinal: Soft and nontender. Ext: Obvious tenderness along the right iliac crest.  No deformity. Neuro: Equal in all extremities.     Data Reviewed: I have  personally reviewed following labs and imaging studies  CBC: Recent Labs  Lab 06/25/21 1212 06/26/21 0525  WBC 6.4 7.7  NEUTROABS 4.7  --   HGB 9.4* 8.8*  HCT 31.4* 29.2*  MCV 85.3 85.6  PLT 310 149   Basic Metabolic Panel: Recent Labs  Lab 06/25/21 1212 06/26/21 0525 06/27/21 0549  NA 142 141 145  K 3.7 3.5 3.7  CL 108 106 107  CO2 26 25 23   GLUCOSE 105* 89 76  BUN 16 17 19   CREATININE 1.01 1.24 1.07  CALCIUM 12.0* 12.1* 11.9*   GFR: Estimated Creatinine Clearance: 40.1 mL/min (by C-G formula based on SCr of 1.07 mg/dL). Liver Function Tests: Recent Labs  Lab 06/25/21 1212 06/26/21 0525  AST 22 18  ALT 17 15  ALKPHOS 106 98  BILITOT 0.6 0.5  PROT 8.3* 7.8  ALBUMIN 2.6* 2.6*   No results for input(s): LIPASE, AMYLASE in the last 168 hours. No results for input(s): AMMONIA in the last 168 hours. Coagulation Profile: Recent Labs  Lab 06/26/21 0525  INR 1.1   Cardiac Enzymes: No results for input(s): CKTOTAL, CKMB, CKMBINDEX, TROPONINI in the last 168 hours. BNP (last 3 results) No results for input(s): PROBNP in the last 8760 hours. HbA1C: No results for input(s): HGBA1C in the last 72 hours. CBG: No results for input(s): GLUCAP in the last 168 hours. Lipid Profile: No results for input(s): CHOL, HDL, LDLCALC, TRIG, CHOLHDL, LDLDIRECT in the last 72 hours. Thyroid Function Tests: No results for input(s): TSH, T4TOTAL, FREET4, T3FREE, THYROIDAB in the last 72 hours. Anemia Panel: No results for input(s): VITAMINB12, FOLATE, FERRITIN, TIBC, IRON, RETICCTPCT in the last 72 hours. Sepsis Labs: No results for input(s): PROCALCITON, LATICACIDVEN in the last 168 hours.  Recent Results (from the past 240 hour(s))  Resp Panel by RT-PCR (Flu A&B, Covid) Nasopharyngeal Swab     Status: None   Collection Time: 06/25/21 12:12 PM   Specimen: Nasopharyngeal Swab; Nasopharyngeal(NP) swabs in vial transport medium  Result Value Ref Range Status   SARS Coronavirus  2 by RT PCR NEGATIVE NEGATIVE Final    Comment: (NOTE) SARS-CoV-2 target nucleic acids are NOT DETECTED.  The SARS-CoV-2 RNA is generally detectable in upper respiratory specimens during the acute phase of infection. The lowest concentration of SARS-CoV-2 viral copies this assay can detect is 138 copies/mL. A negative result does not preclude SARS-Cov-2 infection and should not be used as the sole basis for treatment or other patient management decisions. A negative result may occur with  improper specimen collection/handling, submission of specimen other than nasopharyngeal swab, presence of viral mutation(s) within the areas targeted by this assay, and inadequate number of viral copies(<138 copies/mL). A negative result must be combined with clinical observations, patient history, and epidemiological information. The expected result is Negative.  Fact Sheet for Patients:  EntrepreneurPulse.com.au  Fact Sheet for Healthcare Providers:  IncredibleEmployment.be  This test is no t yet approved or cleared by the [place] and  has been authorized for detection and/or diagnosis of SARS-CoV-2 by FDA under an Emergency Use Authorization (EUA). This EUA will remain  in effect (meaning this test can be used) for the duration of the COVID-19 declaration under Section 564(b)(1) of the Act, 21 U.S.C.section 360bbb-3(b)(1), unless the authorization is terminated  or revoked sooner.       Influenza A by PCR NEGATIVE NEGATIVE Final   Influenza B by PCR NEGATIVE NEGATIVE Final    Comment: (NOTE) The Xpert Xpress SARS-CoV-2/FLU/RSV plus assay is intended as an aid in the diagnosis of influenza from Nasopharyngeal swab specimens and should not be used as a sole basis for treatment. Nasal washings and aspirates are unacceptable for Xpert Xpress SARS-CoV-2/FLU/RSV testing.  Fact Sheet for Patients: EntrepreneurPulse.com.au  Fact  Sheet for Healthcare Providers: IncredibleEmployment.be  This test is not yet approved or cleared by the [place] FDA and has been authorized for detection and/or diagnosis of SARS-CoV-2 by FDA under an Emergency Use Authorization (EUA). This EUA will remain in effect (meaning this test can be used) for the duration of the COVID-19 declaration under Section 564(b)(1) of the Act, 21 U.S.C. section 360bbb-3(b)(1), unless the authorization is terminated or revoked.  Performed at [facility], [place] [address], [place],  [address]          Radiology Studies: CT Angio Chest PE W and/or Wo Contrast  Addendum Date: 06/27/2021   ADDENDUM REPORT: 06/27/2021 07:49 ADDENDUM: Upon further review, note made of distal occlusion of the left mainstem bronchus extending through the left upper lobe bronchus, contiguous with the described hilar mass. Electronically Signed   By: Lucrezia Europe M.D.   On: 06/27/2021 07:49   Result Date: 06/27/2021 CLINICAL DATA:  Shortness of breath times months, cough EXAM: CT ANGIOGRAPHY CHEST CT ABDOMEN AND PELVIS WITH CONTRAST TECHNIQUE: Multidetector CT imaging of the chest was performed using the standard protocol during bolus administration of intravenous contrast. Multiplanar CT image reconstructions and MIPs were obtained to evaluate the vascular anatomy. Multidetector CT imaging of the abdomen and pelvis was performed using the standard protocol during bolus administration of intravenous contrast. CONTRAST:  1mL OMNIPAQUE IOHEXOL 350 MG/ML SOLN COMPARISON:  03/21/2019 FINDINGS: CTA CHEST FINDINGS Cardiovascular: Heart size upper limits normal. No pericardial effusion. Good contrast opacification of pulmonary arterial tree. Partially occlusive embolus in the upper lobe branch of the left pulmonary artery extending into segmental branches. Non opacification of lingular segmental branches of the left pulmonary artery may be due  to embolus or tumor involvement. Near-occlusive narrowing of the left superior and inferior pulmonary veins by hilar mass. Mild scattered calcified atheromatous plaque in the arch and descending thoracic aorta. Mediastinum/Nodes: 6 cm confluent left hilar mass/adenopathy involving left pulmonary veins, and lingular branches of the pulmonary artery as above. Subcentimeter AP window and right paratracheal lymph nodes. Lungs/Pleura: Left hilar mass as above, with nodular changes extending along lingular vessels. There is relative hypoperfusion of the left lung. Emphysematous changes in the right upper lobe. No pleural effusion. No pneumothorax. Musculoskeletal: Bridging osteophytes across multiple levels in the mid and lower thoracic spine. No acute fracture or worrisome bone lesion. Metallic fragments in the right posterior subcutaneous tissues, and adjacent to the T2 spinous process. Review of the MIP images confirms the above findings. CT ABDOMEN and PELVIS FINDINGS Hepatobiliary: No focal liver abnormality is seen. No gallstones, gallbladder wall thickening, or  biliary dilatation. Pancreas: Unremarkable. No pancreatic ductal dilatation or surrounding inflammatory changes. Spleen: Normal in size without focal abnormality. Adrenals/Urinary Tract: No adrenal mass. Symmetric renal enhancement without hydronephrosis or focal lesion. Urinary bladder is incompletely distended. Stomach/Bowel: Stomach decompressed. Small bowel decompressed. Normal appendix. The colon is nondilated, unremarkable. Vascular/Lymphatic: Atheromatous aorta. No abdominal or pelvic adenopathy. Reproductive: Suspect previous prostatectomy. Other: No ascites.  No free air. Musculoskeletal: 8.1 cm lytic hyperemic mass involving the right iliac wing and supra-acetabular region. No acute fracture. Review of the MIP images confirms the above findings. IMPRESSION: 1. 6 cm confluent left hilar mass with vascular involvement as above, presumably lung  carcinoma. 2. 8.1 cm lytic metastasis involving right iliac wing, presumably metastasis, of potential orthopedic significance given the supra-acetabular involvement. This would be approachable for confirmatory core biopsy if needed. 3. Probable embolus involving left upper lobe pulmonary artery branches. Non opacification of lingula branch is more likely related to tumor encasement/involvement. 4.  Emphysema (ICD10-J43.9). Electronically Signed: By: Lucrezia Europe M.D. On: 06/25/2021 16:58   CT ABDOMEN PELVIS W CONTRAST  Addendum Date: 06/27/2021   ADDENDUM REPORT: 06/27/2021 07:49 ADDENDUM: Upon further review, note made of distal occlusion of the left mainstem bronchus extending through the left upper lobe bronchus, contiguous with the described hilar mass. Electronically Signed   By: Lucrezia Europe M.D.   On: 06/27/2021 07:49   Result Date: 06/27/2021 CLINICAL DATA:  Shortness of breath times months, cough EXAM: CT ANGIOGRAPHY CHEST CT ABDOMEN AND PELVIS WITH CONTRAST TECHNIQUE: Multidetector CT imaging of the chest was performed using the standard protocol during bolus administration of intravenous contrast. Multiplanar CT image reconstructions and MIPs were obtained to evaluate the vascular anatomy. Multidetector CT imaging of the abdomen and pelvis was performed using the standard protocol during bolus administration of intravenous contrast. CONTRAST:  46mL OMNIPAQUE IOHEXOL 350 MG/ML SOLN COMPARISON:  03/21/2019 FINDINGS: CTA CHEST FINDINGS Cardiovascular: Heart size upper limits normal. No pericardial effusion. Good contrast opacification of pulmonary arterial tree. Partially occlusive embolus in the upper lobe branch of the left pulmonary artery extending into segmental branches. Non opacification of lingular segmental branches of the left pulmonary artery may be due to embolus or tumor involvement. Near-occlusive narrowing of the left superior and inferior pulmonary veins by hilar mass. Mild scattered  calcified atheromatous plaque in the arch and descending thoracic aorta. Mediastinum/Nodes: 6 cm confluent left hilar mass/adenopathy involving left pulmonary veins, and lingular branches of the pulmonary artery as above. Subcentimeter AP window and right paratracheal lymph nodes. Lungs/Pleura: Left hilar mass as above, with nodular changes extending along lingular vessels. There is relative hypoperfusion of the left lung. Emphysematous changes in the right upper lobe. No pleural effusion. No pneumothorax. Musculoskeletal: Bridging osteophytes across multiple levels in the mid and lower thoracic spine. No acute fracture or worrisome bone lesion. Metallic fragments in the right posterior subcutaneous tissues, and adjacent to the T2 spinous process. Review of the MIP images confirms the above findings. CT ABDOMEN and PELVIS FINDINGS Hepatobiliary: No focal liver abnormality is seen. No gallstones, gallbladder wall thickening, or biliary dilatation. Pancreas: Unremarkable. No pancreatic ductal dilatation or surrounding inflammatory changes. Spleen: Normal in size without focal abnormality. Adrenals/Urinary Tract: No adrenal mass. Symmetric renal enhancement without hydronephrosis or focal lesion. Urinary bladder is incompletely distended. Stomach/Bowel: Stomach decompressed. Small bowel decompressed. Normal appendix. The colon is nondilated, unremarkable. Vascular/Lymphatic: Atheromatous aorta. No abdominal or pelvic adenopathy. Reproductive: Suspect previous prostatectomy. Other: No ascites.  No free air. Musculoskeletal: 8.1 cm lytic  hyperemic mass involving the right iliac wing and supra-acetabular region. No acute fracture. Review of the MIP images confirms the above findings. IMPRESSION: 1. 6 cm confluent left hilar mass with vascular involvement as above, presumably lung carcinoma. 2. 8.1 cm lytic metastasis involving right iliac wing, presumably metastasis, of potential orthopedic significance given the  supra-acetabular involvement. This would be approachable for confirmatory core biopsy if needed. 3. Probable embolus involving left upper lobe pulmonary artery branches. Non opacification of lingula branch is more likely related to tumor encasement/involvement. 4.  Emphysema (ICD10-J43.9). Electronically Signed: By: Lucrezia Europe M.D. On: 06/25/2021 16:58        Scheduled Meds:  allopurinol  100 mg Oral Daily   docusate sodium  100 mg Oral BID   enoxaparin (LOVENOX) injection  55 mg Subcutaneous Q12H   feeding supplement  1 Container Oral TID BM   fluticasone furoate-vilanterol  1 puff Inhalation Daily   folic acid  1 mg Oral Daily   furosemide  20 mg Oral Daily   metoprolol succinate  12.5 mg Oral Daily   multivitamin with minerals  1 tablet Oral Daily   pantoprazole  40 mg Oral Daily   protein supplement  1 Scoop Oral TID WC   sacubitril-valsartan  1 tablet Oral BID   thiamine  100 mg Oral Daily   umeclidinium bromide  1 puff Inhalation Daily   Continuous Infusions:  sodium chloride 100 mL/hr at 06/27/21 9741   zoledronic acid (ZOMETA) IV       LOS: 1 day    Time spent: 32 minutes    Barb Merino, MD [facility] Pager [phone]

## 2021-06-27 NOTE — Progress Notes (Signed)
[facility] for heparin --> lovenox Indication: suspected pulmonary embolus  No Known Allergies  Patient Measurements: Height: 5\' 9"  (175.3 cm) Weight: 55.2 kg (121 lb 11.1 oz) IBW/kg (Calculated) : 70.7 Heparin Dosing Weight: 53 kg  Vital Signs: Temp: 98.2 F (36.8 C) (07/27 0326) Temp Source: Oral (07/27 0326) BP: 116/69 (07/27 0326) Pulse Rate: 76 (07/27 0328)  Labs: Recent Labs    06/25/21 1212 06/25/21 1449 06/26/21 0343 06/26/21 0525 06/27/21 0550  HGB 9.4*  --   --  8.8*  --   HCT 31.4*  --   --  29.2*  --   PLT 310  --   --  334  --   LABPROT  --   --   --  13.9  --   INR  --   --   --  1.1  --   HEPARINUNFRC  --   --  0.15*  --  0.22*  CREATININE 1.01  --   --  1.24  --   TROPONINIHS 30* 31*  --   --   --      Estimated Creatinine Clearance: 34.6 mL/min (by C-G formula based on SCr of 1.24 mg/dL).   Assessment: Patient is an 87 y.o M presented to the ED on 7/25 with c/o SOB and hemoptysis. Chest CT on 7/25 showed lung mass and "probable embolus involving left upper lobe pulmonary artery Branches." Heparin drip started on 7/25.  He underwent bronchoscopy and lung mass biopsy on 7/26.  Pharmacy has been consulted on 7/27 to transition patient to lovenox.   Goal of Therapy:  Anti-Xa level 0.6-1 units/ml 4hrs after LMWH dose given Monitor platelets by anticoagulation protocol: Yes   Plan:  - d/c heparin drip - start lovenox 55 units SQ q12h - cbc q72j - monitor for s/sx bleeding  Robert Hamilton P 06/27/2021,8:08 AM

## 2021-06-27 NOTE — Progress Notes (Signed)
OT Cancellation Note  Patient Details Name: Robert Hamilton MRN: [id number] DOB: 09/07/1934   Cancelled Treatment:    Reason Eval/Treat Not Completed: Patient not medically ready. Per therapy protocol Ot evaluation to be held until 24 hrs after Heparin initiation. Heparin started at [facility] on 7/26. Will hold evaluation now and f/u when heparin level therapeutic.  [name] L [name] 06/27/2021, 7:59 AM

## 2021-06-28 ENCOUNTER — Ambulatory Visit: Payer: Medicare Other | Admitting: Radiation Oncology

## 2021-06-28 ENCOUNTER — Encounter (HOSPITAL_COMMUNITY): Payer: Self-pay | Admitting: Pulmonary Disease

## 2021-06-28 DIAGNOSIS — C7951 Secondary malignant neoplasm of bone: Secondary | ICD-10-CM | POA: Diagnosis not present

## 2021-06-28 DIAGNOSIS — G893 Neoplasm related pain (acute) (chronic): Secondary | ICD-10-CM | POA: Diagnosis not present

## 2021-06-28 LAB — BASIC METABOLIC PANEL
Anion gap: 6 (ref 5–15)
BUN: 18 mg/dL (ref 8–23)
CO2: 25 mmol/L (ref 22–32)
Calcium: 10.2 mg/dL (ref 8.9–10.3)
Chloride: 109 mmol/L (ref 98–111)
Creatinine, Ser: 1.13 mg/dL (ref 0.61–1.24)
GFR, Estimated: >60 mL/min (ref >60)
Glucose, Bld: 95 mg/dL (ref 70–99)
Potassium: 3.4 mmol/L — ABNORMAL LOW (ref 3.5–5.1)
Sodium: 140 mmol/L (ref 135–145)

## 2021-06-28 LAB — KAPPA/LAMBDA LIGHT CHAINS
Kappa free light chain: 64.4 mg/L — ABNORMAL HIGH (ref 3.3–19.4)
Kappa, lambda light chain ratio: 2.07 — ABNORMAL HIGH (ref 0.26–1.65)
Lambda free light chains: 31.1 mg/L — ABNORMAL HIGH (ref 5.7–26.3)

## 2021-06-28 MED ORDER — OXYCODONE HCL 5 MG PO TABS: 5.0000 mg | ORAL_TABLET | ORAL | 0 refills | Status: DC | PRN

## 2021-06-28 MED ORDER — APIXABAN (ELIQUIS) VTE STARTER PACK (10MG AND 5MG): ORAL_TABLET | ORAL | 0 refills | Status: DC

## 2021-06-28 MED ORDER — ACETAMINOPHEN 325 MG PO TABS: 650.0000 mg | ORAL_TABLET | Freq: Four times a day (QID) | ORAL | Status: AC | PRN

## 2021-06-28 MED ORDER — POTASSIUM CHLORIDE CRYS ER 20 MEQ PO TBCR
40.0000 meq | EXTENDED_RELEASE_TABLET | Freq: Two times a day (BID) | ORAL | Status: DC
  Administered 2021-06-28: 40 meq via ORAL
  Filled 2021-06-28: qty 2
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
## 2021-06-28 NOTE — Evaluation (Signed)
Occupational Therapy Evaluation Patient Details Name: Robert Hamilton MRN: [id number] DOB: January 14, 1934 Today's Date: 06/28/2021    History of Present Illness Robert Hamilton is a 87 y.o. male  presents to the emergency room with ongoing shortness of breath, intermittent hemoptysis and right hip pain and unable to walk. CT scan: 6 cm left hilar vascular lesion, left UL lobe pulmonary embolism with no right heart strain, large lytic lesion on the right iliac crest extending to the acetabulum. PMH: HTN, CKD stage II, history of smoking and alcohol use, chronic systolic CHF, history of diverticular bleed and was recently diagnosed with lung tumor and right iliac crest lytic lesion   Clinical Impression   Patient is a 87 year old male who was admitted for above. Patient was previously at home alone with independence in ADLs. Currently patient is min A for getting in bed with pain in LE. Patient was educated on how to manage O2 cord and ECT. Patient verbalized understanding but required consistent education to apply during activity.patient was min guard for transfers with increased time and min guard to don/doff socks seated in chair.  Patient's plan to transition home with daughter support and [facility] services. Patient was noted to have decreased endurance, decreased functional activity tolerance, decreased standing balance, and training in O2 cord management. Patient would continue to benefit from skilled OT services at this time while admitted and after d/c to address noted deficits in order to improve overall safety and independence in ADLs.      Follow Up Recommendations  Home health OT;Supervision/Assistance - 24 hour    Equipment Recommendations  3 in 1 bedside commode;Tub/shower seat    Recommendations for Other Services       Precautions / Restrictions Precautions Precautions: Fall Precaution Comments: monitor O2 Restrictions Weight Bearing Restrictions: No      Mobility Bed  Mobility Overal bed mobility: Needs Assistance Bed Mobility: Supine to Sit     Supine to sit: Min guard          Transfers Overall transfer level: Needs assistance Equipment used: Rolling walker (2 wheeled) Transfers: Sit to/from [facility] Sit to Stand: Min guard Stand pivot transfers: Min guard       General transfer comment: VCs for hand placement to power to stand and return to sitting, good steadiness upon rising    Balance Overall balance assessment: Needs assistance Sitting-balance support: Feet supported Sitting balance-Leahy Scale: Good Sitting balance - Comments: seated EOB   Standing balance support: During functional activity Standing balance-Leahy Scale: Fair Standing balance comment: static without RW, using RW to decrease RW when ambulation                           ADL either performed or assessed with clinical judgement   ADL Overall ADL's : Needs assistance/impaired Eating/Feeding: Set up;Sitting   Grooming: Oral care;Wash/dry face;Sitting;Set up   Upper Body Bathing: Sitting;Set up   Lower Body Bathing: Minimal assistance;Sit to/from stand   Upper Body Dressing : Min guard;Sitting   Lower Body Dressing: Minimal assistance;Sit to/from stand   Toilet Transfer: Designer, television/film set Details (indicate cue type and reason): with education on O2 cord management. Toileting- Clothing Manipulation and Hygiene: Minimal assistance;Sit to/from stand       Functional mobility during ADLs: Min guard;Rolling walker       Vision   Vision Assessment?: No apparent visual deficits     Perception  Praxis      Pertinent Vitals/Pain Pain Assessment: Faces Faces Pain Scale: Hurts a little bit Pain Location: R hip with weight-bearing Pain Descriptors / Indicators: Sore Pain Intervention(s): Limited activity within patient's tolerance;Monitored during session     Hand Dominance Right   Extremity/Trunk  Assessment Upper Extremity Assessment Upper Extremity Assessment: Overall WFL for tasks assessed   Lower Extremity Assessment Lower Extremity Assessment: Defer to PT evaluation   Cervical / Trunk Assessment Cervical / Trunk Assessment: Normal   Communication Communication Communication: No difficulties   Cognition Arousal/Alertness: Awake/alert Behavior During Therapy: WFL for tasks assessed/performed Overall Cognitive Status: Within Functional Limits for tasks assessed                                     General Comments       Exercises     Shoulder Instructions      Home Living Family/patient expects to be discharged to:: Private residence Living Arrangements: Alone Available Help at Discharge: Family;Available PRN/intermittently Type of Home: Apartment Home Access: Level entry     Home Layout: One level     Bathroom Shower/Tub: Tub/shower unit         Home Equipment: Cane - single point;Hand held shower head          Prior Functioning/Environment Level of Independence: Independent        Comments: Pt reports independent with household and community ambulation using SPC, independent with ADLs/IADLs, drives, has daughter ~1 mile away if needed.        OT Problem List: Decreased strength;Impaired balance (sitting and/or standing);Decreased cognition;Pain;Decreased activity tolerance;Decreased coordination;Decreased knowledge of use of DME or AE;Decreased safety awareness      OT Treatment/Interventions: Self-care/ADL training;DME and/or AE instruction;Balance training;Therapeutic activities;Therapeutic exercise;Patient/family education;Energy conservation    OT Goals(Current goals can be found in the care plan section) Acute Rehab OT Goals Patient Stated Goal: regain independence OT Goal Formulation: With patient Time For Goal Achievement: 07/12/21 Potential to Achieve Goals: Good  OT Frequency: Min 2X/week   Barriers to D/C:     patient lives at home alone       Co-evaluation              AM-PAC OT "6 Clicks" Daily Activity     Outcome Measure Help from another person eating meals?: None Help from another person taking care of personal grooming?: A Little Help from another person toileting, which includes using toliet, bedpan, or urinal?: A Little Help from another person bathing (including washing, rinsing, drying)?: A Little Help from another person to put on and taking off regular upper body clothing?: A Little Help from another person to put on and taking off regular lower body clothing?: A Little 6 Click Score: 19   End of Session Equipment Utilized During Treatment: Gait belt;Rolling walker;Oxygen Nurse Communication: Other (comment) (patient was cleard to participate in therapy)  Activity Tolerance: Patient tolerated treatment well Patient left: in bed;with call bell/phone within reach;with bed alarm set  OT Visit Diagnosis: Muscle weakness (generalized) (M62.81);Pain Pain - part of body: Leg                Time: 1203-1226 OT Time Calculation (min): 23 min Charges:  OT General Charges $OT Visit: 1 Visit OT Evaluation $OT Eval Low Complexity: 1 Low OT Treatments $Self Care/Home Management : 8-22 mins  Jackelyn Poling OTR/L, MS Acute Rehabilitation Department Office# [phone] Pager#  [place] 06/28/2021, 12:36 PM

## 2021-06-28 NOTE — Progress Notes (Signed)
SATURATION QUALIFICATIONS: (This note is used to comply with regulatory documentation for home oxygen)  Patient Saturations on Room Air at Rest = 93 %  Patient Saturations on Room Air while Ambulating = 86 %  Patient Saturations on 3 Liters of oxygen while Ambulating = 97 %  Please briefly explain why patient needs home oxygen:  While ambulating patient saturation decreases below 89%

## 2021-06-28 NOTE — TOC Initial Note (Signed)
Transition of Care [facility]) - Initial/Assessment Note    Patient Details  Name: Robert Hamilton MRN: [id number] Date of Birth: [birth year]  Transition of Care [facility]) CM/SW Contact:    Jamyiah Labella, Marjie Skiff, RN Phone Number: 06/28/2021, 11:41 AM  Clinical Narrative:                 Pt from home alone and has daughter that lives nearby. Physical therapy recommendations gone over with pt. Pt politely declines home health services. RW and home 02 ordered for home use. Both ordered from [facility] and will be delivered to pt room.  Expected Discharge Plan: Home/Self Care Barriers to Discharge: No Barriers Identified   Patient Goals and CMS Choice Patient states their goals for this hospitalization and ongoing recovery are:: To go home      Expected Discharge Plan and Services Expected Discharge Plan: Home/Self Care   Discharge Planning Services: CM Consult   Living arrangements for the past 2 months: Apartment Expected Discharge Date: 06/28/21               DME Arranged: Gilford Rile rolling, Oxygen DME Agency: [employer] Date DME Agency Contacted: 06/28/21   Representative spoke with at DME Agency: [place]: Refused HH          Prior Living Arrangements/Services Living arrangements for the past 2 months: Apartment Lives with:: Self Patient language and need for interpreter reviewed:: Yes Do you feel safe going back to the place where you live?: Yes      Need for Family Participation in Patient Care: Yes (Comment)     Criminal Activity/Legal Involvement Pertinent to Current Situation/Hospitalization: No - Comment as needed  Activities of Daily Living Home Assistive Devices/Equipment: Cane (specify quad or straight) ADL Screening (condition at time of admission) Patient's cognitive ability adequate to safely complete daily activities?: Yes Is the patient deaf or have difficulty hearing?: Yes Does the patient have difficulty seeing, even when wearing glasses/contacts?:  No Does the patient have difficulty concentrating, remembering, or making decisions?: No Patient able to express need for assistance with ADLs?: Yes Does the patient have difficulty dressing or bathing?: No Independently performs ADLs?: Yes (appropriate for developmental age) Does the patient have difficulty walking or climbing stairs?: Yes Weakness of Legs: Both Weakness of Arms/Hands: Both  Permission Sought/Granted Permission sought to share information with : Facility Art therapist granted to share information with : Yes, Verbal Permission Granted     Permission granted to share info w AGENCY: [employer]        Emotional Assessment Appearance:: Appears stated age Attitude/Demeanor/Rapport: Gracious Affect (typically observed): Calm Orientation: : Oriented to Self, Oriented to Place, Oriented to  Time, Oriented to Situation Alcohol / Substance Use: Not Applicable Psych Involvement: No (comment)  Admission diagnosis:  Dyspnea on exertion [R06.00] Pain due to malignant neoplasm metastatic to bone (HCC) [G89.3, W25.85] Chronic systolic (congestive) heart failure (HCC) [I50.22] Patient Active Problem List   Diagnosis Date Noted   Metastasis to bone ([place]) 06/27/2021   Protein-calorie malnutrition, severe 06/27/2021   Endobronchial cancer, left ([place])    Pain due to malignant neoplasm metastatic to bone ([place]) 06/25/2021   Pulmonary embolism ([place]) 06/25/2021   Hypercalcemia of malignancy 27/78/2423   Chronic systolic (congestive) heart failure ([place]) 07/19/2019   Nonrheumatic mitral valve regurgitation 07/19/2019   Thrombocytopenia ([place]) 03/23/2019   Diverticulosis of colon with hemorrhage    Acute blood loss anemia    History of prostate cancer 03/22/2019   NSVT (nonsustained ventricular  tachycardia) ([place]) 03/22/2019   GIB (gastrointestinal bleeding) 03/21/2019   Lower GI bleeding 11/19/2018   Hepatic lesion 05/22/2017   History of diverticulitis  05/22/2017   History of GI bleed 05/22/2017   Prostate cancer ([place]) 05/22/2017   GERD (gastroesophageal reflux disease) 05/22/2017   Normocytic anemia 05/22/2017   Essential hypertension 05/22/2017   Chronic kidney disease 05/22/2017   Cigarette smoker 05/22/2017   PCP:  Maryella Shivers, MD Pharmacy:   [facility] [place], [place] - [address] [place] [address] Phone: [phone] Fax: [phone]     Social Determinants of Health (SDOH) Interventions    Readmission Risk Interventions No flowsheet data found.

## 2021-06-29 ENCOUNTER — Ambulatory Visit: Payer: Medicare Other

## 2021-06-29 LAB — SURGICAL PATHOLOGY

## 2021-07-02 ENCOUNTER — Ambulatory Visit: Payer: Medicare Other

## 2021-07-02 ENCOUNTER — Telehealth: Payer: Self-pay | Admitting: Pulmonary Disease

## 2021-07-02 ENCOUNTER — Telehealth: Payer: Self-pay | Admitting: Oncology

## 2021-07-02 DIAGNOSIS — C3482 Malignant neoplasm of overlapping sites of left bronchus and lung: Secondary | ICD-10-CM | POA: Diagnosis not present

## 2021-07-02 DIAGNOSIS — I129 Hypertensive chronic kidney disease with stage 1 through stage 4 chronic kidney disease, or unspecified chronic kidney disease: Secondary | ICD-10-CM | POA: Diagnosis not present

## 2021-07-02 DIAGNOSIS — I509 Heart failure, unspecified: Secondary | ICD-10-CM | POA: Diagnosis not present

## 2021-07-02 DIAGNOSIS — M109 Gout, unspecified: Secondary | ICD-10-CM | POA: Diagnosis not present

## 2021-07-02 DIAGNOSIS — C7951 Secondary malignant neoplasm of bone: Secondary | ICD-10-CM | POA: Diagnosis not present

## 2021-07-02 DIAGNOSIS — Z510 Encounter for antineoplastic radiation therapy: Secondary | ICD-10-CM | POA: Diagnosis not present

## 2021-07-02 DIAGNOSIS — C3412 Malignant neoplasm of upper lobe, left bronchus or lung: Secondary | ICD-10-CM | POA: Diagnosis not present

## 2021-07-02 LAB — MULTIPLE MYELOMA PANEL, SERUM
Albumin SerPl Elph-Mcnc: 2.8 g/dL — ABNORMAL LOW (ref 2.9–4.4)
Albumin/Glob SerPl: 0.8 (ref 0.7–1.7)
Alpha 1: 0.4 g/dL (ref 0.0–0.4)
Alpha2 Glob SerPl Elph-Mcnc: 1 g/dL (ref 0.4–1.0)
B-Globulin SerPl Elph-Mcnc: 1.4 g/dL — ABNORMAL HIGH (ref 0.7–1.3)
Gamma Glob SerPl Elph-Mcnc: 1.1 g/dL (ref 0.4–1.8)
Globulin, Total: 3.9 g/dL (ref 2.2–3.9)
IgA: 534 mg/dL — ABNORMAL HIGH (ref 61–437)
IgG (Immunoglobin G), Serum: 1096 mg/dL (ref 603–1613)
IgM (Immunoglobulin M), Srm: 78 mg/dL (ref 15–143)
M Protein SerPl Elph-Mcnc: 0.4 g/dL — ABNORMAL HIGH
Total Protein ELP: 6.7 g/dL (ref 6.0–8.5)

## 2021-07-02 NOTE — Telephone Encounter (Signed)
Atc patient unable to reach  Next available is 07/23/21 schedule patient.   Will send to Dr. Valeta Harms to see if he wants something else done for patient sooner.

## 2021-07-02 NOTE — Telephone Encounter (Signed)
Patient's daughter called to verify today's Appts

## 2021-07-03 ENCOUNTER — Ambulatory Visit: Payer: Medicare Other

## 2021-07-03 DIAGNOSIS — C3482 Malignant neoplasm of overlapping sites of left bronchus and lung: Secondary | ICD-10-CM | POA: Diagnosis not present

## 2021-07-03 DIAGNOSIS — C7951 Secondary malignant neoplasm of bone: Secondary | ICD-10-CM | POA: Diagnosis not present

## 2021-07-03 DIAGNOSIS — C3412 Malignant neoplasm of upper lobe, left bronchus or lung: Secondary | ICD-10-CM | POA: Diagnosis not present

## 2021-07-03 DIAGNOSIS — Z510 Encounter for antineoplastic radiation therapy: Secondary | ICD-10-CM | POA: Diagnosis not present

## 2021-07-03 NOTE — Telephone Encounter (Signed)
  Just needs a 28min follow up slot. No rush. Next available. I have already seen him and done his procedure. He needs to see oncology next.   Thanks   BLI    Per Dr. Valeta Harms. Please schedule patient if they call back

## 2021-07-04 ENCOUNTER — Ambulatory Visit: Payer: Medicare Other

## 2021-07-04 ENCOUNTER — Other Ambulatory Visit: Payer: Self-pay | Admitting: Hematology and Oncology

## 2021-07-04 ENCOUNTER — Telehealth: Payer: Self-pay | Admitting: Oncology

## 2021-07-04 DIAGNOSIS — C7951 Secondary malignant neoplasm of bone: Secondary | ICD-10-CM | POA: Diagnosis not present

## 2021-07-04 DIAGNOSIS — C3412 Malignant neoplasm of upper lobe, left bronchus or lung: Secondary | ICD-10-CM | POA: Diagnosis not present

## 2021-07-04 DIAGNOSIS — C3482 Malignant neoplasm of overlapping sites of left bronchus and lung: Secondary | ICD-10-CM | POA: Diagnosis not present

## 2021-07-04 DIAGNOSIS — Z510 Encounter for antineoplastic radiation therapy: Secondary | ICD-10-CM | POA: Diagnosis not present

## 2021-07-04 MED ORDER — OXYCODONE HCL 10 MG PO TABS: 10.0000 mg | ORAL_TABLET | ORAL | 0 refills | Status: DC | PRN

## 2021-07-04 NOTE — Telephone Encounter (Signed)
Patient was seen by Lenna Sciara in June for Lytic Bone Lesions/Lung Mass.  Appt made for 07/11/21 Consult w/Dr Bobby Rumpf at 11:00 am

## 2021-07-05 ENCOUNTER — Ambulatory Visit: Payer: Medicare Other

## 2021-07-05 DIAGNOSIS — Z510 Encounter for antineoplastic radiation therapy: Secondary | ICD-10-CM | POA: Diagnosis not present

## 2021-07-05 DIAGNOSIS — C3412 Malignant neoplasm of upper lobe, left bronchus or lung: Secondary | ICD-10-CM | POA: Diagnosis not present

## 2021-07-05 DIAGNOSIS — C3482 Malignant neoplasm of overlapping sites of left bronchus and lung: Secondary | ICD-10-CM | POA: Diagnosis not present

## 2021-07-05 DIAGNOSIS — C7951 Secondary malignant neoplasm of bone: Secondary | ICD-10-CM | POA: Diagnosis not present

## 2021-07-06 ENCOUNTER — Ambulatory Visit: Payer: Medicare Other

## 2021-07-06 DIAGNOSIS — C3482 Malignant neoplasm of overlapping sites of left bronchus and lung: Secondary | ICD-10-CM | POA: Diagnosis not present

## 2021-07-06 DIAGNOSIS — C3412 Malignant neoplasm of upper lobe, left bronchus or lung: Secondary | ICD-10-CM | POA: Diagnosis not present

## 2021-07-06 DIAGNOSIS — Z510 Encounter for antineoplastic radiation therapy: Secondary | ICD-10-CM | POA: Diagnosis not present

## 2021-07-06 DIAGNOSIS — C7951 Secondary malignant neoplasm of bone: Secondary | ICD-10-CM | POA: Diagnosis not present

## 2021-07-09 ENCOUNTER — Ambulatory Visit: Payer: Medicare Other

## 2021-07-09 ENCOUNTER — Telehealth: Payer: Self-pay | Admitting: Oncology

## 2021-07-09 DIAGNOSIS — C3412 Malignant neoplasm of upper lobe, left bronchus or lung: Secondary | ICD-10-CM | POA: Diagnosis not present

## 2021-07-09 DIAGNOSIS — C3482 Malignant neoplasm of overlapping sites of left bronchus and lung: Secondary | ICD-10-CM | POA: Diagnosis not present

## 2021-07-09 DIAGNOSIS — Z510 Encounter for antineoplastic radiation therapy: Secondary | ICD-10-CM | POA: Diagnosis not present

## 2021-07-09 DIAGNOSIS — C7951 Secondary malignant neoplasm of bone: Secondary | ICD-10-CM | POA: Diagnosis not present

## 2021-07-09 NOTE — Telephone Encounter (Signed)
Patient called to verify Dr Bobby Rumpf' 8/10 Appt

## 2021-07-10 ENCOUNTER — Ambulatory Visit: Payer: Medicare Other

## 2021-07-10 ENCOUNTER — Other Ambulatory Visit: Payer: Self-pay | Admitting: *Deleted

## 2021-07-10 DIAGNOSIS — C3412 Malignant neoplasm of upper lobe, left bronchus or lung: Secondary | ICD-10-CM | POA: Diagnosis not present

## 2021-07-10 DIAGNOSIS — Z510 Encounter for antineoplastic radiation therapy: Secondary | ICD-10-CM | POA: Diagnosis not present

## 2021-07-10 DIAGNOSIS — C3482 Malignant neoplasm of overlapping sites of left bronchus and lung: Secondary | ICD-10-CM | POA: Diagnosis not present

## 2021-07-10 DIAGNOSIS — C7951 Secondary malignant neoplasm of bone: Secondary | ICD-10-CM | POA: Diagnosis not present
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
## 2021-07-11 ENCOUNTER — Ambulatory Visit: Payer: Medicare Other

## 2021-07-11 ENCOUNTER — Inpatient Hospital Stay: Payer: Medicare Other | Admitting: Hematology and Oncology

## 2021-07-11 ENCOUNTER — Inpatient Hospital Stay: Payer: Medicare Other | Attending: Hematology and Oncology | Admitting: Oncology

## 2021-07-11 ENCOUNTER — Other Ambulatory Visit: Payer: Self-pay | Admitting: Oncology

## 2021-07-11 VITALS — BP 121/57 | HR 88 | Temp 98.7°F | Resp 16 | Ht 69.0 in | Wt 126.8 lb

## 2021-07-11 DIAGNOSIS — C3492 Malignant neoplasm of unspecified part of left bronchus or lung: Secondary | ICD-10-CM

## 2021-07-11 DIAGNOSIS — D649 Anemia, unspecified: Secondary | ICD-10-CM | POA: Diagnosis not present

## 2021-07-11 DIAGNOSIS — C61 Malignant neoplasm of prostate: Secondary | ICD-10-CM | POA: Diagnosis not present

## 2021-07-11 DIAGNOSIS — F17210 Nicotine dependence, cigarettes, uncomplicated: Secondary | ICD-10-CM | POA: Insufficient documentation

## 2021-07-11 DIAGNOSIS — C3482 Malignant neoplasm of overlapping sites of left bronchus and lung: Secondary | ICD-10-CM | POA: Diagnosis not present

## 2021-07-11 DIAGNOSIS — I130 Hypertensive heart and chronic kidney disease with heart failure and stage 1 through stage 4 chronic kidney disease, or unspecified chronic kidney disease: Secondary | ICD-10-CM | POA: Insufficient documentation

## 2021-07-11 DIAGNOSIS — C7989 Secondary malignant neoplasm of other specified sites: Secondary | ICD-10-CM | POA: Diagnosis not present

## 2021-07-11 DIAGNOSIS — C3490 Malignant neoplasm of unspecified part of unspecified bronchus or lung: Secondary | ICD-10-CM | POA: Diagnosis not present

## 2021-07-11 DIAGNOSIS — C7951 Secondary malignant neoplasm of bone: Secondary | ICD-10-CM | POA: Diagnosis not present

## 2021-07-11 DIAGNOSIS — Z510 Encounter for antineoplastic radiation therapy: Secondary | ICD-10-CM | POA: Diagnosis not present

## 2021-07-11 DIAGNOSIS — C3412 Malignant neoplasm of upper lobe, left bronchus or lung: Secondary | ICD-10-CM | POA: Diagnosis not present

## 2021-07-11 LAB — HEPATIC FUNCTION PANEL
ALT: 10 (ref 10–40)
AST: 20 (ref 14–40)
Alkaline Phosphatase: 117 (ref 25–125)
Bilirubin, Total: 0.3

## 2021-07-11 LAB — BASIC METABOLIC PANEL
BUN: 11 (ref 4–21)
CO2: 25 — AB (ref 13–22)
Chloride: 106 (ref 99–108)
Creatinine: 1 (ref 0.6–1.3)
Glucose: 110
Potassium: 4 (ref 3.4–5.3)
Sodium: 140 (ref 137–147)

## 2021-07-11 LAB — COMPREHENSIVE METABOLIC PANEL
Albumin: 3.1 — AB (ref 3.5–5.0)
Calcium: 8.1 — AB (ref 8.7–10.7)

## 2021-07-11 LAB — CBC AND DIFFERENTIAL
HCT: 26 — AB (ref 41–53)
Hemoglobin: 8.3 — AB (ref 13.5–17.5)
Neutrophils Absolute: 5.06
Platelets: 375 (ref 150–399)
WBC: 6.4

## 2021-07-11 LAB — CBC: RBC: 3.22 — AB (ref 3.87–5.11)

## 2021-07-11 LAB — TSH: TSH: 6.815 u[IU]/mL — ABNORMAL HIGH (ref 0.350–4.500)

## 2021-07-11 NOTE — Progress Notes (Signed)
START OFF PATHWAY REGIMEN - Non-Small Cell Lung   [id number]:Carboplatin AUC=6 IV D1 + Paclitaxel 200 mg/m2 IV D1 + Pembrolizumab 200 mg IV D1 q21 Days x 4 Cycles:   A cycle is every 21 days:     Pembrolizumab      Paclitaxel      Carboplatin   **Always confirm dose/schedule in your pharmacy ordering system**  Patient Characteristics: Stage IV Metastatic, Squamous, Awaiting Molecular Test Results and Need to Start Chemotherapy, PS = 0, 1 Therapeutic Status: Stage IV Metastatic Histology: Squamous Cell ECOG Performance Status: 1 Intent of Therapy: Non-Curative / Palliative Intent, Discussed with Patient

## 2021-07-12 DIAGNOSIS — Z510 Encounter for antineoplastic radiation therapy: Secondary | ICD-10-CM | POA: Diagnosis not present

## 2021-07-12 DIAGNOSIS — C7951 Secondary malignant neoplasm of bone: Secondary | ICD-10-CM | POA: Diagnosis not present

## 2021-07-12 DIAGNOSIS — C3412 Malignant neoplasm of upper lobe, left bronchus or lung: Secondary | ICD-10-CM | POA: Diagnosis not present

## 2021-07-12 DIAGNOSIS — C3482 Malignant neoplasm of overlapping sites of left bronchus and lung: Secondary | ICD-10-CM | POA: Diagnosis not present

## 2021-07-13 DIAGNOSIS — Z510 Encounter for antineoplastic radiation therapy: Secondary | ICD-10-CM | POA: Diagnosis not present

## 2021-07-13 DIAGNOSIS — C3482 Malignant neoplasm of overlapping sites of left bronchus and lung: Secondary | ICD-10-CM | POA: Diagnosis not present

## 2021-07-13 DIAGNOSIS — C3412 Malignant neoplasm of upper lobe, left bronchus or lung: Secondary | ICD-10-CM | POA: Diagnosis not present

## 2021-07-13 DIAGNOSIS — C7951 Secondary malignant neoplasm of bone: Secondary | ICD-10-CM | POA: Diagnosis not present

## 2021-07-13 LAB — T4: T4, Total: 7 ug/dL (ref 4.5–12.0)

## 2021-07-15 ENCOUNTER — Encounter: Payer: Self-pay | Admitting: Oncology

## 2021-07-16 ENCOUNTER — Encounter: Payer: Self-pay | Admitting: Oncology

## 2021-07-16 DIAGNOSIS — C7951 Secondary malignant neoplasm of bone: Secondary | ICD-10-CM | POA: Diagnosis not present

## 2021-07-16 DIAGNOSIS — C3412 Malignant neoplasm of upper lobe, left bronchus or lung: Secondary | ICD-10-CM | POA: Diagnosis not present

## 2021-07-16 DIAGNOSIS — Z510 Encounter for antineoplastic radiation therapy: Secondary | ICD-10-CM | POA: Diagnosis not present

## 2021-07-16 DIAGNOSIS — C3482 Malignant neoplasm of overlapping sites of left bronchus and lung: Secondary | ICD-10-CM | POA: Diagnosis not present

## 2021-07-17 DIAGNOSIS — C3482 Malignant neoplasm of overlapping sites of left bronchus and lung: Secondary | ICD-10-CM | POA: Diagnosis not present

## 2021-07-17 DIAGNOSIS — Z510 Encounter for antineoplastic radiation therapy: Secondary | ICD-10-CM | POA: Diagnosis not present

## 2021-07-17 DIAGNOSIS — C3412 Malignant neoplasm of upper lobe, left bronchus or lung: Secondary | ICD-10-CM | POA: Diagnosis not present

## 2021-07-17 DIAGNOSIS — C7951 Secondary malignant neoplasm of bone: Secondary | ICD-10-CM | POA: Diagnosis not present

## 2021-07-18 ENCOUNTER — Telehealth: Payer: Self-pay | Admitting: Oncology

## 2021-07-18 DIAGNOSIS — Z510 Encounter for antineoplastic radiation therapy: Secondary | ICD-10-CM | POA: Diagnosis not present

## 2021-07-18 DIAGNOSIS — C3482 Malignant neoplasm of overlapping sites of left bronchus and lung: Secondary | ICD-10-CM | POA: Diagnosis not present

## 2021-07-18 DIAGNOSIS — C7951 Secondary malignant neoplasm of bone: Secondary | ICD-10-CM | POA: Diagnosis not present

## 2021-07-18 DIAGNOSIS — C3412 Malignant neoplasm of upper lobe, left bronchus or lung: Secondary | ICD-10-CM | POA: Diagnosis not present

## 2021-07-18 NOTE — Telephone Encounter (Signed)
Per 8/10 LOS, patient scheduled for 8/31 Labs, Follow Up.  Robert Hamilton

## 2021-07-23 ENCOUNTER — Ambulatory Visit: Payer: Medicare Other | Admitting: Pulmonary Disease

## 2021-07-24 NOTE — Progress Notes (Signed)
[facility]  [address] [place],  [place]  [address] [phone]  Clinic Day:  08/01/2021  Referring physician: Maryella Shivers, MD  This document serves as a record of services personally performed by [name], MD. It was created on their behalf by [facility], a trained medical scribe. The creation of this record is based on the scribe's personal observations and the provider's statements to them.  HISTORY OF PRESENT ILLNESS:  The patient is a 87 y.o. male  stage IVA (T3 N0 M1b) squamous cell lung cancer.  He recently completed palliative radiation to his right acetabular region.  He comes in today to discuss potential treatment options.  Unfortunately, his Foundation One testing has not come back yet to determine if he may be eligible for targeted therapy.  He claims his right hip pain can be sever when he placed pressure on it.  He claims his oxycodone is working well in controlling this pain.  He denies having other symptoms/findings which concern him for overt signs of disease progression.    PHYSICAL EXAM:  Blood pressure (!) 122/51, pulse 86, temperature 98.4 F (36.9 C), resp. rate 16, height 5\' 9"  (1.753 m), weight 125 lb 11.2 oz (57 kg), SpO2 96 %. Wt Readings from Last 3 Encounters:  08/01/21 125 lb 11.2 oz (57 kg)  07/11/21 126 lb 12.8 oz (57.5 kg)  06/28/21 127 lb 13.9 oz (58 kg)   Body mass index is 18.56 kg/m. Performance status (ECOG): 2 - Symptomatic, <50% confined to bed Physical Exam Constitutional:      General: He is not in acute distress.    Appearance: Normal appearance. He is normal weight.     Comments: Thin gentleman in a wheelchair  HENT:     Head: Normocephalic and atraumatic.  Eyes:     General: No scleral icterus.    Extraocular Movements: Extraocular movements intact.     Conjunctiva/sclera: Conjunctivae normal.     Pupils: Pupils are equal, round, and reactive to light.  Cardiovascular:     Rate  and Rhythm: Normal rate and regular rhythm.     Pulses: Normal pulses.     Heart sounds: Normal heart sounds. No murmur heard.   No friction rub. No gallop.  Pulmonary:     Effort: Pulmonary effort is normal. No respiratory distress.     Breath sounds: Normal breath sounds.  Abdominal:     General: Bowel sounds are normal. There is no distension.     Palpations: Abdomen is soft. There is no hepatomegaly, splenomegaly or mass.     Tenderness: There is no abdominal tenderness.  Musculoskeletal:        General: Normal range of motion.     Cervical back: Normal range of motion and neck supple.     Right lower leg: No edema.     Left lower leg: No edema.  Lymphadenopathy:     Cervical: No cervical adenopathy.  Skin:    General: Skin is warm and dry.  Neurological:     General: No focal deficit present.     Mental Status: He is alert and oriented to person, place, and time. Mental status is at baseline.  Psychiatric:        Mood and Affect: Mood normal.        Behavior: Behavior normal.        Thought Content: Thought content normal.        Judgment: Judgment normal.   LABS:  CBC Latest Ref Rng & Units 08/01/2021 07/11/2021 06/26/2021  WBC - 5.8 6.4 7.7  Hemoglobin 13.5 - 17.5 8.7(A) 8.3(A) 8.8(L)  Hematocrit 41 - 53 29(A) 26(A) 29.2(L)  Platelets 150 - 399 322 375 334   CMP Latest Ref Rng & Units 08/01/2021 07/11/2021 06/28/2021  Glucose 70 - 99 mg/dL - - 95  BUN 4 - 21 10 11 18   Creatinine 0.6 - 1.3 1.0 1.0 1.13  Sodium 137 - 147 139 140 140  Potassium 3.4 - 5.3 3.2(A) 4.0 3.4(L)  Chloride 99 - 108 107 106 109  CO2 13 - 22 19 25(A) 25  Calcium 8.7 - 10.7 8.5(A) 8.1(A) 10.2  Total Protein 6.5 - 8.1 g/dL - - -  Total Bilirubin 0.3 - 1.2 mg/dL - - -  Alkaline Phos 25 - 125 113 117 -  AST 14 - 40 20 20 -  ALT 10 - 40 8(A) 10 -    ASSESSMENT & PLAN:  An 87 y.o. male with stage IVA (T3 N0 M1b) squamous cell lung cancer, which includes spread of disease to his right hip.  As  mentioned previously, his Foundation One test results are still pending.  The visit today was primarily focused on quality-of-life issues, including his severe constipation.  He now understands to take a laxative every time he takes oxycodone to prevent constipation from being a significant issue.  Overall, he appears to be doing okay.  I will see him back in 2 weeks to go over his Foundation One results, which will be used to determine his frontline therapy.  The patient and his daughter understand all the plans discussed today and are in agreement with them.  I, Rita Ohara, am acting as scribe for [name], MD    I have reviewed this report as typed by the medical scribe, and it is complete and accurate.  Dequincy Macarthur Critchley, MD

## 2021-07-26 ENCOUNTER — Other Ambulatory Visit: Payer: Self-pay | Admitting: Oncology

## 2021-08-01 ENCOUNTER — Inpatient Hospital Stay: Payer: Medicare Other

## 2021-08-01 ENCOUNTER — Other Ambulatory Visit: Payer: Self-pay | Admitting: Oncology

## 2021-08-01 ENCOUNTER — Other Ambulatory Visit: Payer: Self-pay | Admitting: Hematology and Oncology

## 2021-08-01 ENCOUNTER — Inpatient Hospital Stay (INDEPENDENT_AMBULATORY_CARE_PROVIDER_SITE_OTHER): Payer: Medicare Other | Admitting: Oncology

## 2021-08-01 ENCOUNTER — Telehealth: Payer: Self-pay | Admitting: Oncology

## 2021-08-01 VITALS — BP 122/51 | HR 86 | Temp 98.4°F | Resp 16 | Ht 69.0 in | Wt 125.7 lb

## 2021-08-01 DIAGNOSIS — F17210 Nicotine dependence, cigarettes, uncomplicated: Secondary | ICD-10-CM | POA: Diagnosis not present

## 2021-08-01 DIAGNOSIS — C3492 Malignant neoplasm of unspecified part of left bronchus or lung: Secondary | ICD-10-CM

## 2021-08-01 DIAGNOSIS — C3490 Malignant neoplasm of unspecified part of unspecified bronchus or lung: Secondary | ICD-10-CM | POA: Diagnosis not present

## 2021-08-01 DIAGNOSIS — C7989 Secondary malignant neoplasm of other specified sites: Secondary | ICD-10-CM | POA: Diagnosis not present

## 2021-08-01 DIAGNOSIS — I130 Hypertensive heart and chronic kidney disease with heart failure and stage 1 through stage 4 chronic kidney disease, or unspecified chronic kidney disease: Secondary | ICD-10-CM | POA: Diagnosis not present

## 2021-08-01 LAB — TSH: TSH: 4.031 u[IU]/mL (ref 0.350–4.500)

## 2021-08-01 LAB — BASIC METABOLIC PANEL
BUN: 10 (ref 4–21)
CO2: 19 (ref 13–22)
Chloride: 107 (ref 99–108)
Creatinine: 1 (ref 0.6–1.3)
Glucose: 92
Potassium: 3.2 — AB (ref 3.4–5.3)
Sodium: 139 (ref 137–147)

## 2021-08-01 LAB — CBC: RBC: 3.54 — AB (ref 3.87–5.11)

## 2021-08-01 LAB — CBC AND DIFFERENTIAL
HCT: 29 — AB (ref 41–53)
Hemoglobin: 8.7 — AB (ref 13.5–17.5)
Neutrophils Absolute: 4.58
Platelets: 322 (ref 150–399)
WBC: 5.8

## 2021-08-01 LAB — COMPREHENSIVE METABOLIC PANEL
Albumin: 3.1 — AB (ref 3.5–5.0)
Calcium: 8.5 — AB (ref 8.7–10.7)

## 2021-08-01 LAB — HEPATIC FUNCTION PANEL
ALT: 8 — AB (ref 10–40)
AST: 20 (ref 14–40)
Alkaline Phosphatase: 113 (ref 25–125)
Bilirubin, Total: 0.5

## 2021-08-01 NOTE — Telephone Encounter (Signed)
Per 8/31 LOS, patient scheduled for Sept Appt's.  Gave patient Appt Summary

## 2021-08-02 DIAGNOSIS — I129 Hypertensive chronic kidney disease with stage 1 through stage 4 chronic kidney disease, or unspecified chronic kidney disease: Secondary | ICD-10-CM | POA: Diagnosis not present

## 2021-08-02 DIAGNOSIS — M109 Gout, unspecified: Secondary | ICD-10-CM | POA: Diagnosis not present

## 2021-08-02 DIAGNOSIS — I509 Heart failure, unspecified: Secondary | ICD-10-CM | POA: Diagnosis not present

## 2021-08-02 LAB — T4: T4, Total: 7.2 ug/dL (ref 4.5–12.0)

## 2021-08-03 NOTE — Telephone Encounter (Signed)
Per Dr Valeta Harms- pt does not need f/u

## 2021-08-03 NOTE — Telephone Encounter (Signed)
Unfortunately appt did not get scheduled. I am awaiting response from Dr Valeta Harms as to whether he needs to see [place] at this point.

## 2021-08-07 ENCOUNTER — Encounter: Payer: Self-pay | Admitting: Oncology

## 2021-08-09 NOTE — Progress Notes (Signed)
[facility]  [address] [place],  [place]  [address] [phone]  Clinic Day:  08/16/2021  Referring physician: Maryella Shivers, MD  This document serves as a record of services personally performed by [name], MD. It was created on their behalf by [facility] E, a trained medical scribe. The creation of this record is based on the scribe's personal observations and the provider's statements to them.  HISTORY OF PRESENT ILLNESS:  The patient is a 87 y.o. male  stage IVA (T3 N0 M1b) squamous cell lung cancer.  He recently completed palliative radiation to his right acetabular region.  He comes in today to discuss potential treatment options. Unfortunately, despite multiple weeks, his Foundation One test results have not come back yet to determine if he may be eligible for targeted therapy.  He claims his right hip pain can be severe when he placed pressure on it.  However, he claims this pain in very infrequent in nature.  His oxycodone works fine in controlling this pain.  He denies having other symptoms/findings which concern him for overt signs of disease progression.     PHYSICAL EXAM: Blood pressure 135/69, pulse 82, temperature 98.1 F (36.7 C), resp. rate 16, height 5\' 9"  (1.753 m), weight 131 lb 14.4 oz (59.8 kg), SpO2 95 %. Wt Readings from Last 3 Encounters:  08/16/21 131 lb 14.4 oz (59.8 kg)  08/01/21 125 lb 11.2 oz (57 kg)  07/11/21 126 lb 12.8 oz (57.5 kg)   Body mass index is 19.48 kg/m. Performance status (ECOG): 2 - Symptomatic, <50% confined to bed Physical Exam Constitutional:      General: He is not in acute distress.    Appearance: Normal appearance. He is normal weight.     Comments: Thin gentleman in a wheelchair  HENT:     Head: Normocephalic and atraumatic.  Eyes:     General: No scleral icterus.    Extraocular Movements: Extraocular movements intact.     Conjunctiva/sclera: Conjunctivae normal.     Pupils:  Pupils are equal, round, and reactive to light.  Cardiovascular:     Rate and Rhythm: Normal rate and regular rhythm.     Pulses: Normal pulses.     Heart sounds: Normal heart sounds. No murmur heard.   No friction rub. No gallop.  Pulmonary:     Effort: Pulmonary effort is normal. No respiratory distress.     Breath sounds: Normal breath sounds.  Abdominal:     General: Bowel sounds are normal. There is no distension.     Palpations: Abdomen is soft. There is no hepatomegaly, splenomegaly or mass.     Tenderness: There is no abdominal tenderness.  Musculoskeletal:        General: Normal range of motion.     Cervical back: Normal range of motion and neck supple.     Right lower leg: No edema.     Left lower leg: No edema.  Lymphadenopathy:     Cervical: No cervical adenopathy.  Skin:    General: Skin is warm and dry.  Neurological:     General: No focal deficit present.     Mental Status: He is alert and oriented to person, place, and time. Mental status is at baseline.  Psychiatric:        Mood and Affect: Mood normal.        Behavior: Behavior normal.        Thought Content: Thought content normal.  Judgment: Judgment normal.   LABS:   CBC Latest Ref Rng & Units 08/16/2021 08/01/2021 07/11/2021  WBC - 4.1 5.8 6.4  Hemoglobin 13.5 - 17.5 8.4(A) 8.7(A) 8.3(A)  Hematocrit 41 - 53 27(A) 29(A) 26(A)  Platelets 150 - 399 228 322 375   CMP Latest Ref Rng & Units 08/16/2021 08/01/2021 07/11/2021  Glucose 70 - 99 mg/dL - - -  BUN 4 - 21 10 10 11   Creatinine 0.6 - 1.3 1.0 1.0 1.0  Sodium 137 - 147 139 139 140  Potassium 3.4 - 5.3 3.5 3.2(A) 4.0  Chloride 99 - 108 106 107 106  CO2 13 - 22 26(A) 19 25(A)  Calcium 8.7 - 10.7 8.1(A) 8.5(A) 8.1(A)  Total Protein 6.5 - 8.1 g/dL - - -  Total Bilirubin 0.3 - 1.2 mg/dL - - -  Alkaline Phos 25 - 125 93 113 117  AST 14 - 40 20 20 20   ALT 10 - 40 8(A) 8(A) 10    ASSESSMENT & PLAN:  An 87 y.o. male with stage IVA (T3 N0 M1b)  squamous cell lung cancer, which includes spread of disease to his right hip.  As mentioned previously, his Foundation One test results are still pending.   Despite how disappointing the long wait has been, the patient still wants to wait for these results to come back before committing to anything.  He is aware that carboplatin/paclitaxel/pembrolizumab would what I would consider if testing shows no evidence of a mutation.  Per Foundation One, his testing should be back within a week.  I will see him back in 1 week to go over his Foundation One results, which will be used to determine his frontline therapy.  The patient and his daughter understand all the plans discussed today and are in agreement with them.  I, Rita Ohara, am acting as scribe for [name], MD    I have reviewed this report as typed by the medical scribe, and it is complete and accurate.  Dequincy Macarthur Critchley, MD

## 2021-08-16 ENCOUNTER — Telehealth: Payer: Self-pay | Admitting: Oncology

## 2021-08-16 ENCOUNTER — Inpatient Hospital Stay (INDEPENDENT_AMBULATORY_CARE_PROVIDER_SITE_OTHER): Payer: Medicare Other | Admitting: Oncology

## 2021-08-16 ENCOUNTER — Other Ambulatory Visit: Payer: Self-pay | Admitting: Hematology and Oncology

## 2021-08-16 ENCOUNTER — Inpatient Hospital Stay: Payer: Medicare Other | Attending: Hematology and Oncology

## 2021-08-16 VITALS — BP 135/69 | HR 82 | Temp 98.1°F | Resp 16 | Ht 69.0 in | Wt 131.9 lb

## 2021-08-16 DIAGNOSIS — C3492 Malignant neoplasm of unspecified part of left bronchus or lung: Secondary | ICD-10-CM | POA: Diagnosis not present

## 2021-08-16 DIAGNOSIS — C7951 Secondary malignant neoplasm of bone: Secondary | ICD-10-CM | POA: Diagnosis not present

## 2021-08-16 DIAGNOSIS — C3490 Malignant neoplasm of unspecified part of unspecified bronchus or lung: Secondary | ICD-10-CM | POA: Diagnosis not present

## 2021-08-16 LAB — HEPATIC FUNCTION PANEL
ALT: 8 — AB (ref 10–40)
AST: 20 (ref 14–40)
Alkaline Phosphatase: 93 (ref 25–125)
Bilirubin, Total: 0.3

## 2021-08-16 LAB — BASIC METABOLIC PANEL
BUN: 10 (ref 4–21)
CO2: 26 — AB (ref 13–22)
Chloride: 106 (ref 99–108)
Creatinine: 1 (ref 0.6–1.3)
Glucose: 131
Potassium: 3.5 (ref 3.4–5.3)
Sodium: 139 (ref 137–147)

## 2021-08-16 LAB — CBC
MCV: 80 (ref 80–94)
RBC: 3.42 — AB (ref 3.87–5.11)

## 2021-08-16 LAB — COMPREHENSIVE METABOLIC PANEL
Albumin: 2.8 — AB (ref 3.5–5.0)
Calcium: 8.1 — AB (ref 8.7–10.7)

## 2021-08-16 LAB — CBC AND DIFFERENTIAL
HCT: 27 — AB (ref 41–53)
Hemoglobin: 8.4 — AB (ref 13.5–17.5)
Neutrophils Absolute: 2.71
Platelets: 228 (ref 150–399)
WBC: 4.1

## 2021-08-16 LAB — TSH: TSH: 4.412 u[IU]/mL (ref 0.350–4.500)

## 2021-08-16 LAB — CORRECTED CALCIUM (CC13): Calcium, Corrected: 9.3

## 2021-08-16 NOTE — Telephone Encounter (Signed)
Per 9/15 LOS next appt scheduled and confirmed by patient

## 2021-08-18 LAB — T4: T4, Total: 7.4 ug/dL (ref 4.5–12.0)

## 2021-08-22 ENCOUNTER — Ambulatory Visit: Payer: Medicare Other | Admitting: Cardiology

## 2021-08-24 ENCOUNTER — Telehealth: Payer: Self-pay | Admitting: Oncology

## 2021-08-24 ENCOUNTER — Ambulatory Visit: Payer: Medicare Other | Admitting: Oncology

## 2021-08-24 NOTE — Telephone Encounter (Signed)
Per 9/23 Staff Message, please cancel patient's Appts for today.  Patient is aware

## 2021-08-28 DIAGNOSIS — C3492 Malignant neoplasm of unspecified part of left bronchus or lung: Secondary | ICD-10-CM | POA: Diagnosis not present

## 2021-08-29 ENCOUNTER — Encounter (HOSPITAL_COMMUNITY): Payer: Self-pay

## 2021-09-01 DIAGNOSIS — I129 Hypertensive chronic kidney disease with stage 1 through stage 4 chronic kidney disease, or unspecified chronic kidney disease: Secondary | ICD-10-CM | POA: Diagnosis not present

## 2021-09-01 DIAGNOSIS — R7303 Prediabetes: Secondary | ICD-10-CM | POA: Diagnosis not present

## 2021-09-01 DIAGNOSIS — M109 Gout, unspecified: Secondary | ICD-10-CM | POA: Diagnosis not present

## 2021-09-03 ENCOUNTER — Encounter: Payer: Self-pay | Admitting: Oncology

## 2021-09-05 NOTE — Progress Notes (Signed)
[facility]  [address] [place],  [place]  [address] [phone]  Clinic Day:  09/06/2021  Referring physician: Maryella Shivers, MD  This document serves as a record of services personally performed by Robert Potter, MD. It was created on their behalf by [facility] E, a trained medical scribe. The creation of this record is based on the scribe's personal observations and the provider's statements to them.  HISTORY OF PRESENT ILLNESS:  The patient is a 87 y.o. male  stage IVA (T3 N0 M1b) squamous cell lung cancer.  He comes in today to go over his Foundation One test results to determine if his cancer has any type of actionable mutation for which targeted therapy could be given.  Overall, the patient is doing fairly well.  He has already undergone palliative radiation to his right acetabular region.  He denies having other symptoms/findings which concern him for overt signs of disease progression.    PHYSICAL EXAM: Blood pressure (!) 155/73, pulse 88, temperature 98.7 F (37.1 C), resp. rate 16, height 5\' 9"  (1.753 m), weight 130 lb 14.4 oz (59.4 kg), SpO2 94 %. Wt Readings from Last 3 Encounters:  09/06/21 130 lb 14.4 oz (59.4 kg)  08/16/21 131 lb 14.4 oz (59.8 kg)  08/01/21 125 lb 11.2 oz (57 kg)   Body mass index is 19.33 kg/m. Performance status (ECOG): 2 - Symptomatic, <50% confined to bed Physical Exam Constitutional:      General: He is not in acute distress.    Appearance: Normal appearance. He is normal weight.     Comments: Thin gentleman in a wheelchair  HENT:     Head: Normocephalic and atraumatic.  Eyes:     General: No scleral icterus.    Extraocular Movements: Extraocular movements intact.     Conjunctiva/sclera: Conjunctivae normal.     Pupils: Pupils are equal, round, and reactive to light.  Cardiovascular:     Rate and Rhythm: Normal rate and regular rhythm.     Pulses: Normal pulses.     Heart sounds: Normal heart  sounds. No murmur heard.   No friction rub. No gallop.  Pulmonary:     Effort: Pulmonary effort is normal. No respiratory distress.     Breath sounds: Normal breath sounds.  Abdominal:     General: Bowel sounds are normal. There is no distension.     Palpations: Abdomen is soft. There is no hepatomegaly, splenomegaly or mass.     Tenderness: There is no abdominal tenderness.  Musculoskeletal:        General: Normal range of motion.     Cervical back: Normal range of motion and neck supple.     Right lower leg: No edema.     Left lower leg: No edema.  Lymphadenopathy:     Cervical: No cervical adenopathy.  Skin:    General: Skin is warm and dry.  Neurological:     General: No focal deficit present.     Mental Status: He is alert and oriented to person, place, and time. Mental status is at baseline.  Psychiatric:        Mood and Affect: Mood normal.        Behavior: Behavior normal.        Thought Content: Thought content normal.        Judgment: Judgment normal.   PATHOLOGY:  Foundation One showed no standard actionable mutations with his lung cancer for which targeted therapy can be applied.  LABS:   CBC Latest Ref Rng & Units 08/16/2021 08/01/2021 07/11/2021  WBC - 4.1 5.8 6.4  Hemoglobin 13.5 - 17.5 8.4(A) 8.7(A) 8.3(A)  Hematocrit 41 - 53 27(A) 29(A) 26(A)  Platelets 150 - 399 228 322 375   CMP Latest Ref Rng & Units 08/16/2021 08/01/2021 07/11/2021  Glucose 70 - 99 mg/dL - - -  BUN 4 - 21 10 10 11   Creatinine 0.6 - 1.3 1.0 1.0 1.0  Sodium 137 - 147 139 139 140  Potassium 3.4 - 5.3 3.5 3.2(A) 4.0  Chloride 99 - 108 106 107 106  CO2 13 - 22 26(A) 19 25(A)  Calcium 8.7 - 10.7 8.1(A) 8.5(A) 8.1(A)  Total Protein 6.5 - 8.1 g/dL - - -  Total Bilirubin 0.3 - 1.2 mg/dL - - -  Alkaline Phos 25 - 125 93 113 117  AST 14 - 40 20 20 20   ALT 10 - 40 8(A) 8(A) 10    ASSESSMENT & PLAN:  An 87 y.o. male with stage IVA (T3 N0 M1b) squamous cell lung cancer, which includes spread  of disease to his right hip.  As mentioned previously, his Foundation One test results show no standard actionable mutations with respect to his lung cancer for which targeted therapy could be used.  Based upon this, the plan will be to place him on  carboplatin/paclitaxel/pembrolizumab.  He was made aware of the side effects which can go along with this regimen, including nausea, alopecia, diarrhea, and shortness of breath.  I will arrange for him to undergo port placement, through which all of his IV therapy will be given.  His 1st cycle of treatment will commence within the next 2 weeks.  I will see him back 3 weeks later before he heads into his 2nd cycle of treatment.  The patient understands all the plans discussed today and is in agreement with them.   I, Rita Ohara, am acting as scribe for Robert Potter, MD    I have reviewed this report as typed by the medical scribe, and it is complete and accurate.  Dequincy Macarthur Critchley, MD

## 2021-09-06 ENCOUNTER — Inpatient Hospital Stay: Payer: Medicare Other | Attending: Hematology and Oncology | Admitting: Oncology

## 2021-09-06 ENCOUNTER — Other Ambulatory Visit: Payer: Self-pay

## 2021-09-06 VITALS — BP 155/73 | HR 88 | Temp 98.7°F | Resp 16 | Ht 69.0 in | Wt 130.9 lb

## 2021-09-06 DIAGNOSIS — Z5111 Encounter for antineoplastic chemotherapy: Secondary | ICD-10-CM | POA: Insufficient documentation

## 2021-09-06 DIAGNOSIS — Z5112 Encounter for antineoplastic immunotherapy: Secondary | ICD-10-CM | POA: Insufficient documentation

## 2021-09-06 DIAGNOSIS — Z79899 Other long term (current) drug therapy: Secondary | ICD-10-CM | POA: Insufficient documentation

## 2021-09-06 DIAGNOSIS — C3492 Malignant neoplasm of unspecified part of left bronchus or lung: Secondary | ICD-10-CM | POA: Diagnosis not present

## 2021-09-06 DIAGNOSIS — C7951 Secondary malignant neoplasm of bone: Secondary | ICD-10-CM | POA: Insufficient documentation

## 2021-09-08 ENCOUNTER — Encounter: Payer: Self-pay | Admitting: Oncology

## 2021-09-10 DIAGNOSIS — E785 Hyperlipidemia, unspecified: Secondary | ICD-10-CM | POA: Diagnosis not present

## 2021-09-10 DIAGNOSIS — I1310 Hypertensive heart and chronic kidney disease without heart failure, with stage 1 through stage 4 chronic kidney disease, or unspecified chronic kidney disease: Secondary | ICD-10-CM | POA: Diagnosis not present

## 2021-09-10 DIAGNOSIS — R7303 Prediabetes: Secondary | ICD-10-CM | POA: Diagnosis not present

## 2021-09-11 DIAGNOSIS — C3482 Malignant neoplasm of overlapping sites of left bronchus and lung: Secondary | ICD-10-CM | POA: Insufficient documentation

## 2021-09-11 DIAGNOSIS — C7951 Secondary malignant neoplasm of bone: Secondary | ICD-10-CM | POA: Diagnosis not present

## 2021-09-13 ENCOUNTER — Other Ambulatory Visit: Payer: Self-pay

## 2021-09-13 ENCOUNTER — Encounter: Payer: Self-pay | Admitting: Oncology

## 2021-09-13 NOTE — Telephone Encounter (Signed)
error 

## 2021-09-14 ENCOUNTER — Encounter: Payer: Self-pay | Admitting: Oncology

## 2021-09-14 ENCOUNTER — Other Ambulatory Visit: Payer: Self-pay | Admitting: Cardiology

## 2021-09-14 DIAGNOSIS — I10 Essential (primary) hypertension: Secondary | ICD-10-CM | POA: Diagnosis not present

## 2021-09-14 DIAGNOSIS — Z87891 Personal history of nicotine dependence: Secondary | ICD-10-CM | POA: Diagnosis not present

## 2021-09-14 DIAGNOSIS — C3482 Malignant neoplasm of overlapping sites of left bronchus and lung: Secondary | ICD-10-CM | POA: Diagnosis not present

## 2021-09-14 DIAGNOSIS — J449 Chronic obstructive pulmonary disease, unspecified: Secondary | ICD-10-CM | POA: Diagnosis not present

## 2021-09-14 DIAGNOSIS — J90 Pleural effusion, not elsewhere classified: Secondary | ICD-10-CM | POA: Diagnosis not present

## 2021-09-14 DIAGNOSIS — K219 Gastro-esophageal reflux disease without esophagitis: Secondary | ICD-10-CM | POA: Diagnosis not present

## 2021-09-14 DIAGNOSIS — C7951 Secondary malignant neoplasm of bone: Secondary | ICD-10-CM | POA: Diagnosis not present

## 2021-09-14 DIAGNOSIS — I5022 Chronic systolic (congestive) heart failure: Secondary | ICD-10-CM

## 2021-09-17 ENCOUNTER — Inpatient Hospital Stay (INDEPENDENT_AMBULATORY_CARE_PROVIDER_SITE_OTHER): Payer: Medicare Other | Admitting: Hematology and Oncology

## 2021-09-17 ENCOUNTER — Other Ambulatory Visit: Payer: Self-pay | Admitting: Hematology and Oncology

## 2021-09-17 ENCOUNTER — Other Ambulatory Visit: Payer: Self-pay

## 2021-09-17 ENCOUNTER — Encounter: Payer: Self-pay | Admitting: Oncology

## 2021-09-17 ENCOUNTER — Encounter: Payer: Self-pay | Admitting: Hematology and Oncology

## 2021-09-17 ENCOUNTER — Telehealth: Payer: Self-pay | Admitting: Hematology and Oncology

## 2021-09-17 ENCOUNTER — Inpatient Hospital Stay: Payer: Medicare Other

## 2021-09-17 VITALS — BP 160/79 | HR 89 | Temp 97.5°F | Resp 18 | Ht 69.0 in | Wt 136.2 lb

## 2021-09-17 DIAGNOSIS — C7951 Secondary malignant neoplasm of bone: Secondary | ICD-10-CM | POA: Diagnosis not present

## 2021-09-17 DIAGNOSIS — Z5111 Encounter for antineoplastic chemotherapy: Secondary | ICD-10-CM | POA: Diagnosis not present

## 2021-09-17 DIAGNOSIS — Z136 Encounter for screening for cardiovascular disorders: Secondary | ICD-10-CM | POA: Diagnosis not present

## 2021-09-17 DIAGNOSIS — D649 Anemia, unspecified: Secondary | ICD-10-CM | POA: Diagnosis not present

## 2021-09-17 DIAGNOSIS — E785 Hyperlipidemia, unspecified: Secondary | ICD-10-CM | POA: Diagnosis not present

## 2021-09-17 DIAGNOSIS — Z139 Encounter for screening, unspecified: Secondary | ICD-10-CM | POA: Diagnosis not present

## 2021-09-17 DIAGNOSIS — C3492 Malignant neoplasm of unspecified part of left bronchus or lung: Secondary | ICD-10-CM

## 2021-09-17 DIAGNOSIS — Z1339 Encounter for screening examination for other mental health and behavioral disorders: Secondary | ICD-10-CM | POA: Diagnosis not present

## 2021-09-17 DIAGNOSIS — I1310 Hypertensive heart and chronic kidney disease without heart failure, with stage 1 through stage 4 chronic kidney disease, or unspecified chronic kidney disease: Secondary | ICD-10-CM | POA: Diagnosis not present

## 2021-09-17 DIAGNOSIS — I4891 Unspecified atrial fibrillation: Secondary | ICD-10-CM | POA: Diagnosis not present

## 2021-09-17 DIAGNOSIS — Z5112 Encounter for antineoplastic immunotherapy: Secondary | ICD-10-CM | POA: Diagnosis not present

## 2021-09-17 DIAGNOSIS — Z0000 Encounter for general adult medical examination without abnormal findings: Secondary | ICD-10-CM | POA: Diagnosis not present

## 2021-09-17 DIAGNOSIS — Z1331 Encounter for screening for depression: Secondary | ICD-10-CM | POA: Diagnosis not present

## 2021-09-17 DIAGNOSIS — R7303 Prediabetes: Secondary | ICD-10-CM | POA: Diagnosis not present

## 2021-09-17 DIAGNOSIS — Z79899 Other long term (current) drug therapy: Secondary | ICD-10-CM | POA: Diagnosis not present

## 2021-09-17 LAB — BASIC METABOLIC PANEL
BUN: 9 (ref 4–21)
CO2: 26 — AB (ref 13–22)
Chloride: 106 (ref 99–108)
Creatinine: 0.9 (ref 0.6–1.3)
Glucose: 101
Potassium: 3.1 — AB (ref 3.4–5.3)
Sodium: 142 (ref 137–147)

## 2021-09-17 LAB — TSH: TSH: 6.871 u[IU]/mL — ABNORMAL HIGH (ref 0.350–4.500)

## 2021-09-17 LAB — CBC AND DIFFERENTIAL
HCT: 29 — AB (ref 41–53)
Hemoglobin: 8.7 — AB (ref 13.5–17.5)
Neutrophils Absolute: 4.14
Platelets: 310 (ref 150–399)
WBC: 5.6

## 2021-09-17 LAB — HEPATIC FUNCTION PANEL
ALT: 6 — AB (ref 10–40)
AST: 17 (ref 14–40)
Alkaline Phosphatase: 120 (ref 25–125)
Bilirubin, Total: 0.3

## 2021-09-17 LAB — COMPREHENSIVE METABOLIC PANEL
Albumin: 3.4 — AB (ref 3.5–5.0)
Calcium: 8.6 — AB (ref 8.7–10.7)

## 2021-09-17 LAB — CBC: RBC: 3.62 — AB (ref 3.87–5.11)

## 2021-09-17 MED ORDER — PROCHLORPERAZINE MALEATE 10 MG PO TABS: 10.0000 mg | ORAL_TABLET | Freq: Four times a day (QID) | ORAL | 3 refills | Status: DC | PRN

## 2021-09-17 MED ORDER — DEXAMETHASONE 4 MG PO TABS: ORAL_TABLET | ORAL | 0 refills | Status: DC

## 2021-09-17 MED ORDER — ONDANSETRON HCL 4 MG PO TABS: 4.0000 mg | ORAL_TABLET | ORAL | 3 refills | Status: DC | PRN

## 2021-09-17 MED ORDER — OXYCODONE HCL 10 MG PO TABS: 10.0000 mg | ORAL_TABLET | ORAL | 0 refills | Status: DC | PRN

## 2021-09-17 MED ORDER — POTASSIUM CHLORIDE CRYS ER 20 MEQ PO TBCR: 20.0000 meq | EXTENDED_RELEASE_TABLET | Freq: Once | ORAL | 0 refills | Status: DC

## 2021-09-17 MED FILL — Pembrolizumab IV Soln 100 MG/4ML (25 MG/ML): INTRAVENOUS | Qty: 8 | Status: AC

## 2021-09-17 MED FILL — Dexamethasone Sodium Phosphate Inj 100 MG/10ML: INTRAMUSCULAR | Qty: 1 | Status: AC

## 2021-09-17 MED FILL — Fosaprepitant Dimeglumine For IV Infusion 150 MG (Base Eq): INTRAVENOUS | Qty: 5 | Status: AC

## 2021-09-17 MED FILL — Carboplatin IV Soln 450 MG/45ML: INTRAVENOUS | Qty: 28 | Status: AC

## 2021-09-17 MED FILL — Paclitaxel IV Conc 300 MG/50ML (6 MG/ML): INTRAVENOUS | Qty: 45 | Status: AC

## 2021-09-17 NOTE — Telephone Encounter (Signed)
[facility]

## 2021-09-17 NOTE — Progress Notes (Signed)
[facility]  Telephone:([phone] [phone]  Patient Care Team: Maryella Shivers, MD as PCP - General (Family Medicine) [name], MD as Consulting Physician (Cardiology) Melodye Ped, NP as Nurse Practitioner (Hematology and Oncology) [name], MD as Consulting Physician (Oncology)   Name of the patient: Trimaine Maser  [id number]  26-Nov-1934   Date of visit: 09/17/21  Diagnosis- Squamous cell lung cancer  Chief complaint/Reason for visit- Initial Meeting for [facility], preparing for starting chemotherapy   Heme/Onc history:  Oncology History  Squamous cell lung cancer, left ([place])  06/26/2021 Initial Diagnosis   Squamous cell lung cancer, left ([place])   07/11/2021 Cancer Staging   Staging form: Lung, AJCC 8th Edition - Clinical stage from 07/11/2021: Stage IVA (cT3, cN0, cM1b) - Signed by [name], MD on 07/11/2021 Histopathologic type: Squamous cell carcinoma, NOS Stage prefix: Initial diagnosis   09/18/2021 -  Chemotherapy   Patient is on Treatment Plan : LUNG NSCLC Carboplatin + Paclitaxel + Pembrolizumab q21d x 4 cycles / Pembrolizumab Maintenance Q21D       Interval history-  Patient presents to chemo care clinic today for initial meeting in preparation for starting chemotherapy. I introduced the chemo care clinic and we discussed that the role of the clinic is to assist those who are at an increased risk of emergency room visits and/or complications during the course of chemotherapy treatment. We discussed that the increased risk takes into account factors such as age, performance status, and co-morbidities. We also discussed that for some, this might include barriers to care such as not having a primary care provider, lack of insurance/transportation, or not being able to afford medications. We discussed that the goal of the program is to help prevent unplanned ER  visits and help reduce complications during chemotherapy. We do this by discussing specific risk factors to each individual and identifying ways that we can help improve these risk factors and reduce barriers to care.   No Known Allergies  Past Medical History:  Diagnosis Date   Anemia    Cancer ([place])    CHF (congestive heart failure) ([facility])    Chronic kidney disease    COPD (chronic obstructive pulmonary disease) ([place])    Diverticulosis    GERD (gastroesophageal reflux disease)    Hypertension     Past Surgical History:  Procedure Laterality Date   CRYOTHERAPY  06/26/2021   Procedure: CRYOTHERAPY;  Surgeon: Garner Nash, DO;  Location: [facility];  Service: Pulmonary;;   HEMOSTASIS CONTROL  06/26/2021   Procedure: HEMOSTASIS CONTROL;  Surgeon: Garner Nash, DO;  Location: [facility];  Service: Pulmonary;;   HERNIA REPAIR     PROSTATE SURGERY     PROSTATECTOMY     VIDEO BRONCHOSCOPY Left 06/26/2021   Procedure: VIDEO BRONCHOSCOPY WITHOUT FLUORO;  Surgeon: Garner Nash, DO;  Location: [facility];  Service: Pulmonary;  Laterality: Left;  possible cryotherapy    Social History   Socioeconomic History   Marital status: Divorced    Spouse name: Not on file   Number of children: 2   Years of education: Not on file   Highest education level: Not on file  Occupational History   Not on file  Tobacco Use   Smoking status: Former    Packs/day: 0.25    Types: Cigarettes    Quit date: 12/2020    Years since quitting: 0.7   Smokeless tobacco: Never  Tobacco comments:    1-2 per day  Vaping Use   Vaping Use: Never used  Substance and Sexual Activity   Alcohol use: Not Currently    Comment: stopped 12/2018   Drug use: No   Sexual activity: Yes  Other Topics Concern   Not on file  Social History Narrative   Not on file   Social Determinants of Health   Financial Resource Strain: Not on file  Food Insecurity: Not on file  Transportation Needs: Not on file   Physical Activity: Not on file  Stress: Not on file  Social Connections: Not on file  Intimate Partner Violence: Not on file    Family History  Problem Relation Age of Onset   Heart attack Mother    Hypertension Mother    Hypertension Father    Hypertension Sister    Hypertension Sister    Hypertension Sister    Hypertension Sister      Current Outpatient Medications:    acetaminophen (TYLENOL) 325 MG tablet, Take 2 tablets (650 mg total) by mouth every 6 (six) hours as needed for mild pain or fever (or Fever >/= 101)., Disp: , Rfl:    allopurinol (ZYLOPRIM) 100 MG tablet, Take 100 mg by mouth daily., Disp: , Rfl:    APIXABAN (ELIQUIS) VTE STARTER PACK (10MG  AND 5MG ), Take as directed on package: start with two-5mg  tablets twice daily for 7 days. On day 8, switch to one-5mg  tablet twice daily., Disp: 1 each, Rfl: 0   ENTRESTO 24-26 MG, TAKE 1 TABLET BY MOUTH TWICE DAILY, Disp: 60 tablet, Rfl: 1   Fluticasone-Umeclidin-Vilant (TRELEGY ELLIPTA) 100-62.5-25 MCG/INH AEPB, Inhale into the lungs daily., Disp: , Rfl:    folic acid (FOLVITE) 1 MG tablet, Take 1 mg by mouth daily., Disp: , Rfl:    metoprolol succinate (TOPROL XL) 25 MG 24 hr tablet, Take 0.5 tablets (12.5 mg total) by mouth daily., Disp: 30 tablet, Rfl: 1   Oxycodone HCl 10 MG TABS, Take 1-1.5 tablets (10-15 mg total) by mouth every 4 (four) hours as needed., Disp: 180 tablet, Rfl: 0   pantoprazole (PROTONIX) 40 MG tablet, Take 40 mg by mouth daily., Disp: , Rfl:    thiamine (VITAMIN B-1) 100 MG tablet, Take 100 mg by mouth daily., Disp: , Rfl:   CMP Latest Ref Rng & Units 08/16/2021  Glucose 70 - 99 mg/dL -  BUN 4 - 21 10  Creatinine 0.6 - 1.3 1.0  Sodium 137 - 147 139  Potassium 3.4 - 5.3 3.5  Chloride 99 - 108 106  CO2 13 - 22 26(A)  Calcium 8.7 - 10.7 8.1(A)  Total Protein 6.5 - 8.1 g/dL -  Total Bilirubin 0.3 - 1.2 mg/dL -  Alkaline Phos 25 - 125 93  AST 14 - 40 20  ALT 10 - 40 8(A)   CBC Latest Ref Rng &  Units 08/16/2021  WBC - 4.1  Hemoglobin 13.5 - 17.5 8.4(A)  Hematocrit 41 - 53 27(A)  Platelets 150 - 399 228    No images are attached to the encounter.  No results found.   Assessment and plan- Patient is a 87 y.o. male who presents to [facility] for initial meeting in preparation for starting chemotherapy for the treatment of lung cancer.   Chemo Care Clinic/High Risk for ER/Hospitalization during chemotherapy- We discussed the role of the chemo care clinic and identified patient specific risk factors. I discussed that patient was identified as high risk primarily based  on:  Patient has past medical history positive for: Past Medical History:  Diagnosis Date   Anemia    Cancer ([place])    CHF (congestive heart failure) ([place])    Chronic kidney disease    COPD (chronic obstructive pulmonary disease) ([place])    Diverticulosis    GERD (gastroesophageal reflux disease)    Hypertension     Patient has past surgical history positive for: Past Surgical History:  Procedure Laterality Date   CRYOTHERAPY  06/26/2021   Procedure: CRYOTHERAPY;  Surgeon: Garner Nash, DO;  Location: [facility];  Service: Pulmonary;;   HEMOSTASIS CONTROL  06/26/2021   Procedure: HEMOSTASIS CONTROL;  Surgeon: Garner Nash, DO;  Location: [facility];  Service: Pulmonary;;   HERNIA REPAIR     PROSTATE SURGERY     PROSTATECTOMY     VIDEO BRONCHOSCOPY Left 06/26/2021   Procedure: VIDEO BRONCHOSCOPY WITHOUT FLUORO;  Surgeon: Garner Nash, DO;  Location: [facility];  Service: Pulmonary;  Laterality: Left;  possible cryotherapy   The patient is a with newly diagnosed squamous cell lung cancer  Patient presents to clinic today with his daughter for chemotherapy education and palliative care consult.  We will start paclitaxel, carboplatin, and pembrolizumab.  We will send in prescriptions for prochlorperazine and ondansetron.  The patient verbalizes understanding of and agreement to the plan as  discussed today.  Provided general information including the following: 1.  Date of education: 09/17/2021 2.  Physician name: Dr. Bobby Rumpf 3.  Diagnosis: Squamous Cell lung cancer 4.  Stage: Stage IVA 5.  Palliatieve 6.  Chemotherapy plan including drugs and how often: Paclitaxel, carboplatin, pembrolizumab 7.  Start date: 09/18/2021 8.  Other referrals: None at this time 9.  The patient is to call our office with any questions or concerns.  Our office number [phone], if after hours or on the weekend, call the same number and wait for the answering service.  There is always an oncologist on call 10.  Medications prescribed: ondansetron, prochlorperazine, pembrolizumab 11.  The patient has verbalized understanding of the treatment plan and has no barriers to adherence or understanding.  Obtained signed consent from patient.  Discussed symptoms including 1.  Low blood counts including red blood cells, white blood cells and platelets. 2. Infection including to avoid large crowds, wash hands frequently, and stay away from people who were sick.  If fever develops of 100.4 or higher, call our office. 3.  Mucositis-given instructions on mouth rinse (baking soda and salt mixture).  Keep mouth clean.  Use soft bristle toothbrush.  If mouth sores develop, call our clinic. 4.  Nausea/vomiting-gave prescriptions for ondansetron 4 mg every 4 hours as needed for nausea, may take around the clock if persistent.  Compazine 10 mg every 6 hours, may take around the clock if persistent. 5.  Diarrhea-use over-the-counter Imodium.  Call clinic if not controlled. 6.  Constipation-use senna, 1 to 2 tablets twice a day.  If no BM in 2 to 3 days call the clinic. 7.  Loss of appetite-try to eat small meals every 2-3 hours.  Call clinic if not eating. 8.  Taste changes-zinc 500 mg daily.  If becomes severe call clinic. 9.  Alcoholic beverages. 10.  Drink 2 to 3 quarts of water per day. 11.  Peripheral  neuropathy-patient to call if numbness or tingling in hands or feet is persistent  Neulasta-will be given 24 to 48 hours after chemotherapy.  Gave information sheet on bone and joint pain.  Use Claritin or Pepcid.  May use ibuprofen or Aleve.  Call if symptoms persist or are unbearable.  Answered questions to patient satisfaction.  Patient is to call with any further questions or concerns.    The medication prescribed to the patient will be printed out from [email or web address] This will give the following information: Name of your medication Approved uses Dose and schedule Storage and handling Handling body fluids and waste Drug and food interactions Possible side effects and management Pregnancy, sexual activity, and contraception Obtaining medication   We discussed that social determinants of health may have significant impacts on health and outcomes for cancer patients.  Today we discussed specific social determinants of performance status, alcohol use, depression, financial needs, food insecurity, housing, interpersonal violence, social connections, stress, tobacco use, and transportation.    After lengthy discussion the following were identified as areas of need:   Outpatient services: We discussed options including home based and outpatient services, DME and care program. We discusssed that patients who participate in regular physical activity report fewer negative impacts of cancer and treatments and report less fatigue.   Financial Concerns: We discussed that living with cancer can create tremendous financial burden.  We discussed options for assistance. I asked that if assistance is needed in affording medications or paying bills to please let us know so that we can provide assistance. We discussed options for food including social services, [employer] ($50 every 2 weeks) and onsite food pantry.  We will also notify [facility] to see if cancer center can provide  additional support.  Referral to Social work: Introduced Education officer, museum Elease Etienne and the services he can provide such as support with utility bill, cell phone and gas vouchers.   Support groups: We discussed options for support groups at the cancer center. If interested, please notify nurse navigator to enroll. We discussed options for managing stress including healthy eating, exercise as well as participating in no charge counseling services at the cancer center and support groups.  If these are of interest, patient can notify either myself or primary nursing team.We discussed options for management including medications and referral to quit Smart program  Transportation: We discussed options for transportation including [employer], paratransit, bus routes, link transit, taxi/uber/lyft, and cancer center van.  I have notified primary oncology team who will help assist with arranging Lucianne Lei transportation for appointments when/if needed. We also discussed options for transportation on short notice/acute visits.  Palliative care services: We have palliative care services available in the cancer center to discuss goals of care and advanced care planning.  Please let us know if you have any questions or would like to speak to our palliative nurse practitioner.  Symptom Management Clinic: We discussed our symptom management clinic which is available for acute concerns while receiving treatment such as nausea, vomiting or diarrhea.  We can be reached via telephone at [phone] or through my chart.  We are available for virtual or in person visits on the same day from 830 to 4 PM Monday through Friday. He denies needing specific assistance at this time and He will be followed by Dr. Jaclyn Shaggy  clinical team.  Plan: Discussed symptom management clinic. Discussed palliative care services. Discussed resources that are available here at the cancer center. Discussed medications and new prescriptions to begin treatment  such as anti-nausea or steroids.   Disposition: RTC on   Visit Diagnosis No diagnosis found.  Patient expressed understanding and was in agreement with this plan. He  also understands that He can call clinic at any time with any questions, concerns, or complaints.   I provided 30 minutes of  face to face  during this encounter, and > 50% was spent counseling as documented under my assessment & plan.   Dayton Scrape, FNP- [facility]

## 2021-09-18 ENCOUNTER — Encounter: Payer: Self-pay | Admitting: Oncology

## 2021-09-18 ENCOUNTER — Inpatient Hospital Stay: Payer: Medicare Other

## 2021-09-18 VITALS — BP 124/98 | HR 86 | Temp 97.7°F | Resp 16 | Ht 69.0 in | Wt 136.1 lb

## 2021-09-18 DIAGNOSIS — C3492 Malignant neoplasm of unspecified part of left bronchus or lung: Secondary | ICD-10-CM | POA: Diagnosis not present

## 2021-09-18 DIAGNOSIS — Z5111 Encounter for antineoplastic chemotherapy: Secondary | ICD-10-CM | POA: Diagnosis not present

## 2021-09-18 DIAGNOSIS — C7951 Secondary malignant neoplasm of bone: Secondary | ICD-10-CM | POA: Diagnosis not present

## 2021-09-18 DIAGNOSIS — Z5112 Encounter for antineoplastic immunotherapy: Secondary | ICD-10-CM | POA: Diagnosis not present

## 2021-09-18 DIAGNOSIS — Z79899 Other long term (current) drug therapy: Secondary | ICD-10-CM | POA: Diagnosis not present

## 2021-09-18 LAB — T4: T4, Total: 8.1 ug/dL (ref 4.5–12.0)

## 2021-09-18 MED ORDER — DIPHENHYDRAMINE HCL 50 MG/ML IJ SOLN
50.0000 mg | Freq: Once | INTRAMUSCULAR | Status: AC
  Administered 2021-09-18: 50 mg via INTRAVENOUS
  Filled 2021-09-18: qty 1

## 2021-09-18 MED ORDER — SODIUM CHLORIDE 0.9 % IV SOLN
160.0000 mg/m2 | Freq: Once | INTRAVENOUS | Status: AC
  Administered 2021-09-18: 270 mg via INTRAVENOUS
  Filled 2021-09-18: qty 45

## 2021-09-18 MED ORDER — FAMOTIDINE IN NACL 20-0.9 MG/50ML-% IV SOLN
20.0000 mg | Freq: Once | INTRAVENOUS | Status: AC | PRN
  Administered 2021-09-18: 20 mg via INTRAVENOUS

## 2021-09-18 MED ORDER — FAMOTIDINE IN NACL 20-0.9 MG/50ML-% IV SOLN: 20.0000 mg | Freq: Once | INTRAVENOUS | Status: DC

## 2021-09-18 MED ORDER — METHYLPREDNISOLONE SODIUM SUCC 125 MG IJ SOLR
125.0000 mg | Freq: Once | INTRAMUSCULAR | Status: AC | PRN
  Administered 2021-09-18: 125 mg via INTRAVENOUS

## 2021-09-18 MED ORDER — PALONOSETRON HCL INJECTION 0.25 MG/5ML
0.2500 mg | Freq: Once | INTRAVENOUS | Status: AC
  Administered 2021-09-18: 0.25 mg via INTRAVENOUS
  Filled 2021-09-18: qty 5

## 2021-09-18 MED ORDER — SODIUM CHLORIDE 0.9% FLUSH
10.0000 mL | INTRAVENOUS | Status: DC | PRN
Start: 2021-09-18 — End: 2021-09-18
  Administered 2021-09-18: 10 mL

## 2021-09-18 MED ORDER — SODIUM CHLORIDE 0.9 % IV SOLN
10.0000 mg | Freq: Once | INTRAVENOUS | Status: AC
  Administered 2021-09-18: 10 mg via INTRAVENOUS
  Filled 2021-09-18: qty 10

## 2021-09-18 MED ORDER — HEPARIN SOD (PORK) LOCK FLUSH 100 UNIT/ML IV SOLN
500.0000 [IU] | Freq: Once | INTRAVENOUS | Status: AC | PRN
  Administered 2021-09-18: 500 [IU]

## 2021-09-18 MED ORDER — SODIUM CHLORIDE 0.9 % IV SOLN
150.0000 mg | Freq: Once | INTRAVENOUS | Status: AC
  Administered 2021-09-18: 150 mg via INTRAVENOUS
  Filled 2021-09-18: qty 150

## 2021-09-18 MED ORDER — FAMOTIDINE 20 MG IN NS 100 ML IVPB: 20.0000 mg | Freq: Once | INTRAVENOUS | Status: DC

## 2021-09-18 MED ORDER — FAMOTIDINE IN NACL 20-0.9 MG/50ML-% IV SOLN
20.0000 mg | Freq: Once | INTRAVENOUS | Status: AC
  Administered 2021-09-18: 20 mg via INTRAVENOUS

## 2021-09-18 MED ORDER — SODIUM CHLORIDE 0.9 % IV SOLN
275.6000 mg | Freq: Once | INTRAVENOUS | Status: AC
  Administered 2021-09-18: 280 mg via INTRAVENOUS
  Filled 2021-09-18: qty 28

## 2021-09-18 MED ORDER — SODIUM CHLORIDE 0.9 % IV SOLN: Freq: Once | INTRAVENOUS | Status: AC

## 2021-09-18 MED ORDER — SODIUM CHLORIDE 0.9 % IV SOLN
200.0000 mg | Freq: Once | INTRAVENOUS | Status: AC
  Administered 2021-09-18: 200 mg via INTRAVENOUS
  Filled 2021-09-18: qty 8

## 2021-09-18 NOTE — Progress Notes (Signed)
AVS reviewed with patient and daughter. Discharged home, stable.

## 2021-09-18 NOTE — Progress Notes (Signed)
Hypersensitivity Reaction note  Date of event: 09/18/21 Time of event: 1205 Generic name of drug involved: Taxol Name of provider notified of the hypersensitivity reaction: Dickey Gave Was agent that likely caused hypersensitivity reaction added to Allergies List within EMR? Taxol Chain of events including reaction signs/symptoms, treatment administered, and outcome (e.g., drug resumed; drug discontinued; sent to Emergency Department; etc.)  Taxol started at 1201- at 1205 patient reported chest tightness and "lips feeling odd" . Infusion stopped, placed on 2lpm/[place]. Dickey Gave at bedside- orders received for Solumedrol 25mg  and Pepcid 20mg , and carried out. Patient's symptoms resolved quickly.   Maxwell Marion, RN 09/18/2021 12:19 PM

## 2021-09-18 NOTE — Patient Instructions (Signed)
[facility]  Discharge Instructions: Thank you for choosing [facility] to provide your oncology and hematology care.  If you have a lab appointment with the [facility], please go directly to the [facility] and check in at the registration area.   Wear comfortable clothing and clothing appropriate for easy access to any Portacath or PICC line.   We strive to give you quality time with your provider. You may need to reschedule your appointment if you arrive late (15 or more minutes).  Arriving late affects you and other patients whose appointments are after yours.  Also, if you miss three or more appointments without notifying the office, you may be dismissed from the clinic at the provider's discretion.      For prescription refill requests, have your pharmacy contact our office and allow 72 hours for refills to be completed.    Today you received the following chemotherapy and/or immunotherapy agents:Keytruda, carboplatin, taxolPembrolizumab injection What is this medication? PEMBROLIZUMAB (pem broe liz ue mab) is a monoclonal antibody. It is used to treat certain types of cancer. This medicine may be used for other purposes; ask your health care provider or pharmacist if you have questions. COMMON BRAND NAME(S): Keytruda What should I tell my care team before I take this medication? They need to know if you have any of these conditions: autoimmune diseases like Crohn's disease, ulcerative colitis, or lupus have had or planning to have an allogeneic stem cell transplant (uses someone else's stem cells) history of organ transplant history of chest radiation nervous system problems like myasthenia gravis or Guillain-Barre syndrome an unusual or allergic reaction to pembrolizumab, other medicines, foods, dyes, or preservatives pregnant or trying to get pregnant breast-feeding How should I use this medication? This medicine is for infusion into a  vein. It is given by a health care professional in a hospital or clinic setting. A special MedGuide will be given to you before each treatment. Be sure to read this information carefully each time. Talk to your pediatrician regarding the use of this medicine in children. While this drug may be prescribed for children as young as 6 months for selected conditions, precautions do apply. Overdosage: If you think you have taken too much of this medicine contact a poison control center or emergency room at once. NOTE: This medicine is only for you. Do not share this medicine with others. What if I miss a dose? It is important not to miss your dose. Call your doctor or health care professional if you are unable to keep an appointment. What may interact with this medication? Interactions have not been studied. This list may not describe all possible interactions. Give your health care provider a list of all the medicines, herbs, non-prescription drugs, or dietary supplements you use. Also tell them if you smoke, drink alcohol, or use illegal drugs. Some items may interact with your medicine. What should I watch for while using this medication? Your condition will be monitored carefully while you are receiving this medicine. You may need blood work done while you are taking this medicine. Do not become pregnant while taking this medicine or for 4 months after stopping it. Women should inform their doctor if they wish to become pregnant or think they might be pregnant. There is a potential for serious side effects to an unborn child. Talk to your health care professional or pharmacist for more information. Do not breast-feed an infant while taking this medicine or  for 4 months after the last dose. What side effects may I notice from receiving this medication? Side effects that you should report to your doctor or health care professional as soon as possible: allergic reactions like skin rash, itching or hives,  swelling of the face, lips, or tongue bloody or black, tarry breathing problems changes in vision chest pain chills confusion constipation cough diarrhea dizziness or feeling faint or lightheaded fast or irregular heartbeat fever flushing joint pain low blood counts - this medicine may decrease the number of white blood cells, red blood cells and platelets. You may be at increased risk for infections and bleeding. muscle pain muscle weakness pain, tingling, numbness in the hands or feet persistent headache redness, blistering, peeling or loosening of the skin, including inside the mouth signs and symptoms of high blood sugar such as dizziness; dry mouth; dry skin; fruity breath; nausea; stomach pain; increased hunger or thirst; increased urination signs and symptoms of kidney injury like trouble passing urine or change in the amount of urine signs and symptoms of liver injury like dark urine, light-colored stools, loss of appetite, nausea, right upper belly pain, yellowing of the eyes or skin sweating swollen lymph nodes weight loss Side effects that usually do not require medical attention (report to your doctor or health care professional if they continue or are bothersome): decreased appetite hair loss tiredness This list may not describe all possible side effects. Call your doctor for medical advice about side effects. You may report side effects to FDA at 1-800-FDA-1088. Where should I keep my medication? This drug is given in a hospital or clinic and will not be stored at home. NOTE: This sheet is a summary. It may not cover all possible information. If you have questions about this medicine, talk to your doctor, pharmacist, or health care provider.  2022 Elsevier/Gold Standard (2019-10-20 21:44:53) Carboplatin injection What is this medication? CARBOPLATIN (KAR boe pla tin) is a chemotherapy drug. It targets fast dividing cells, like cancer cells, and causes these cells  to die. This medicine is used to treat ovarian cancer and many other cancers. This medicine may be used for other purposes; ask your health care provider or pharmacist if you have questions. COMMON BRAND NAME(S): Paraplatin What should I tell my care team before I take this medication? They need to know if you have any of these conditions: blood disorders hearing problems kidney disease recent or ongoing radiation therapy an unusual or allergic reaction to carboplatin, cisplatin, other chemotherapy, other medicines, foods, dyes, or preservatives pregnant or trying to get pregnant breast-feeding How should I use this medication? This drug is usually given as an infusion into a vein. It is administered in a hospital or clinic by a specially trained health care professional. Talk to your pediatrician regarding the use of this medicine in children. Special care may be needed. Overdosage: If you think you have taken too much of this medicine contact a poison control center or emergency room at once. NOTE: This medicine is only for you. Do not share this medicine with others. What if I miss a dose? It is important not to miss a dose. Call your doctor or health care professional if you are unable to keep an appointment. What may interact with this medication? medicines for seizures medicines to increase blood counts like filgrastim, pegfilgrastim, sargramostim some antibiotics like amikacin, gentamicin, neomycin, streptomycin, tobramycin vaccines Talk to your doctor or health care professional before taking any of these medicines: acetaminophen aspirin ibuprofen  ketoprofen naproxen This list may not describe all possible interactions. Give your health care provider a list of all the medicines, herbs, non-prescription drugs, or dietary supplements you use. Also tell them if you smoke, drink alcohol, or use illegal drugs. Some items may interact with your medicine. What should I watch for while  using this medication? Your condition will be monitored carefully while you are receiving this medicine. You will need important blood work done while you are taking this medicine. This drug may make you feel generally unwell. This is not uncommon, as chemotherapy can affect healthy cells as well as cancer cells. Report any side effects. Continue your course of treatment even though you feel ill unless your doctor tells you to stop. In some cases, you may be given additional medicines to help with side effects. Follow all directions for their use. Call your doctor or health care professional for advice if you get a fever, chills or sore throat, or other symptoms of a cold or flu. Do not treat yourself. This drug decreases your body's ability to fight infections. Try to avoid being around people who are sick. This medicine may increase your risk to bruise or bleed. Call your doctor or health care professional if you notice any unusual bleeding. Be careful brushing and flossing your teeth or using a toothpick because you may get an infection or bleed more easily. If you have any dental work done, tell your dentist you are receiving this medicine. Avoid taking products that contain aspirin, acetaminophen, ibuprofen, naproxen, or ketoprofen unless instructed by your doctor. These medicines may hide a fever. Do not become pregnant while taking this medicine. Women should inform their doctor if they wish to become pregnant or think they might be pregnant. There is a potential for serious side effects to an unborn child. Talk to your health care professional or pharmacist for more information. Do not breast-feed an infant while taking this medicine. What side effects may I notice from receiving this medication? Side effects that you should report to your doctor or health care professional as soon as possible: allergic reactions like skin rash, itching or hives, swelling of the face, lips, or tongue signs of  infection - fever or chills, cough, sore throat, pain or difficulty passing urine signs of decreased platelets or bleeding - bruising, pinpoint red spots on the skin, black, tarry stools, nosebleeds signs of decreased red blood cells - unusually weak or tired, fainting spells, lightheadedness breathing problems changes in hearing changes in vision chest pain high blood pressure low blood counts - This drug may decrease the number of white blood cells, red blood cells and platelets. You may be at increased risk for infections and bleeding. nausea and vomiting pain, swelling, redness or irritation at the injection site pain, tingling, numbness in the hands or feet problems with balance, talking, walking trouble passing urine or change in the amount of urine Side effects that usually do not require medical attention (report to your doctor or health care professional if they continue or are bothersome): hair loss loss of appetite metallic taste in the mouth or changes in taste This list may not describe all possible side effects. Call your doctor for medical advice about side effects. You may report side effects to FDA at 1-800-FDA-1088. Where should I keep my medication? This drug is given in a hospital or clinic and will not be stored at home. NOTE: This sheet is a summary. It may not cover all possible information. If  you have questions about this medicine, talk to your doctor, pharmacist, or health care provider.  2022 Elsevier/Gold Standard (2008-02-23 14:38:05) Paclitaxel injection What is this medication? PACLITAXEL (PAK li TAX el) is a chemotherapy drug. It targets fast dividing cells, like cancer cells, and causes these cells to die. This medicine is used to treat ovarian cancer, breast cancer, lung cancer, Kaposi's sarcoma, and other cancers. This medicine may be used for other purposes; ask your health care provider or pharmacist if you have questions. COMMON BRAND NAME(S): Onxol,  Taxol What should I tell my care team before I take this medication? They need to know if you have any of these conditions: history of irregular heartbeat liver disease low blood counts, like low white cell, platelet, or red cell counts lung or breathing disease, like asthma tingling of the fingers or toes, or other nerve disorder an unusual or allergic reaction to paclitaxel, alcohol, polyoxyethylated castor oil, other chemotherapy, other medicines, foods, dyes, or preservatives pregnant or trying to get pregnant breast-feeding How should I use this medication? This drug is given as an infusion into a vein. It is administered in a hospital or clinic by a specially trained health care professional. Talk to your pediatrician regarding the use of this medicine in children. Special care may be needed. Overdosage: If you think you have taken too much of this medicine contact a poison control center or emergency room at once. NOTE: This medicine is only for you. Do not share this medicine with others. What if I miss a dose? It is important not to miss your dose. Call your doctor or health care professional if you are unable to keep an appointment. What may interact with this medication? Do not take this medicine with any of the following medications: live virus vaccines This medicine may also interact with the following medications: antiviral medicines for hepatitis, HIV or AIDS certain antibiotics like erythromycin and clarithromycin certain medicines for fungal infections like ketoconazole and itraconazole certain medicines for seizures like carbamazepine, phenobarbital, phenytoin gemfibrozil nefazodone rifampin St. John's wort This list may not describe all possible interactions. Give your health care provider a list of all the medicines, herbs, non-prescription drugs, or dietary supplements you use. Also tell them if you smoke, drink alcohol, or use illegal drugs. Some items may interact  with your medicine. What should I watch for while using this medication? Your condition will be monitored carefully while you are receiving this medicine. You will need important blood work done while you are taking this medicine. This medicine can cause serious allergic reactions. To reduce your risk you will need to take other medicine(s) before treatment with this medicine. If you experience allergic reactions like skin rash, itching or hives, swelling of the face, lips, or tongue, tell your doctor or health care professional right away. In some cases, you may be given additional medicines to help with side effects. Follow all directions for their use. This drug may make you feel generally unwell. This is not uncommon, as chemotherapy can affect healthy cells as well as cancer cells. Report any side effects. Continue your course of treatment even though you feel ill unless your doctor tells you to stop. Call your doctor or health care professional for advice if you get a fever, chills or sore throat, or other symptoms of a cold or flu. Do not treat yourself. This drug decreases your body's ability to fight infections. Try to avoid being around people who are sick. This medicine may increase  your risk to bruise or bleed. Call your doctor or health care professional if you notice any unusual bleeding. Be careful brushing and flossing your teeth or using a toothpick because you may get an infection or bleed more easily. If you have any dental work done, tell your dentist you are receiving this medicine. Avoid taking products that contain aspirin, acetaminophen, ibuprofen, naproxen, or ketoprofen unless instructed by your doctor. These medicines may hide a fever. Do not become pregnant while taking this medicine. Women should inform their doctor if they wish to become pregnant or think they might be pregnant. There is a potential for serious side effects to an unborn child. Talk to your health care  professional or pharmacist for more information. Do not breast-feed an infant while taking this medicine. Men are advised not to father a child while receiving this medicine. This product may contain alcohol. Ask your pharmacist or healthcare provider if this medicine contains alcohol. Be sure to tell all healthcare providers you are taking this medicine. Certain medicines, like metronidazole and disulfiram, can cause an unpleasant reaction when taken with alcohol. The reaction includes flushing, headache, nausea, vomiting, sweating, and increased thirst. The reaction can last from 30 minutes to several hours. What side effects may I notice from receiving this medication? Side effects that you should report to your doctor or health care professional as soon as possible: allergic reactions like skin rash, itching or hives, swelling of the face, lips, or tongue breathing problems changes in vision fast, irregular heartbeat high or low blood pressure mouth sores pain, tingling, numbness in the hands or feet signs of decreased platelets or bleeding - bruising, pinpoint red spots on the skin, black, tarry stools, blood in the urine signs of decreased red blood cells - unusually weak or tired, feeling faint or lightheaded, falls signs of infection - fever or chills, cough, sore throat, pain or difficulty passing urine signs and symptoms of liver injury like dark yellow or brown urine; general ill feeling or flu-like symptoms; light-colored stools; loss of appetite; nausea; right upper belly pain; unusually weak or tired; yellowing of the eyes or skin swelling of the ankles, feet, hands unusually slow heartbeat Side effects that usually do not require medical attention (report to your doctor or health care professional if they continue or are bothersome): diarrhea hair loss loss of appetite muscle or joint pain nausea, vomiting pain, redness, or irritation at site where injected tiredness This  list may not describe all possible side effects. Call your doctor for medical advice about side effects. You may report side effects to FDA at 1-800-FDA-1088. Where should I keep my medication? This drug is given in a hospital or clinic and will not be stored at home. NOTE: This sheet is a summary. It may not cover all possible information. If you have questions about this medicine, talk to your doctor, pharmacist, or health care provider.  2022 Elsevier/Gold Standard (2019-10-20 13:37:23)    To help prevent nausea and vomiting after your treatment, we encourage you to take your nausea medication as directed.  BELOW ARE SYMPTOMS THAT SHOULD BE REPORTED IMMEDIATELY: *FEVER GREATER THAN 100.4 F (38 C) OR HIGHER *CHILLS OR SWEATING *NAUSEA AND VOMITING THAT IS NOT CONTROLLED WITH YOUR NAUSEA MEDICATION *UNUSUAL SHORTNESS OF BREATH *UNUSUAL BRUISING OR BLEEDING *URINARY PROBLEMS (pain or burning when urinating, or frequent urination) *BOWEL PROBLEMS (unusual diarrhea, constipation, pain near the anus) TENDERNESS IN MOUTH AND THROAT WITH OR WITHOUT PRESENCE OF ULCERS (sore throat, sores in  mouth, or a toothache) UNUSUAL RASH, SWELLING OR PAIN  UNUSUAL VAGINAL DISCHARGE OR ITCHING   Items with * indicate a potential emergency and should be followed up as soon as possible or go to the Emergency Department if any problems should occur.  Please show the CHEMOTHERAPY ALERT CARD or IMMUNOTHERAPY ALERT CARD at check-in to the Emergency Department and triage nurse.  Should you have questions after your visit or need to cancel or reschedule your appointment, please contact [facility]  Dept: [phone]  and follow the prompts.  Office hours are 8:00 a.m. to 4:30 p.m. Monday - Friday. Please note that voicemails left after 4:00 p.m. may not be returned until the following business day.  We are closed weekends and major holidays. You have access to a nurse at all times for  urgent questions. Please call the main number to the clinic Dept: [phone] and follow the prompts.  For any non-urgent questions, you may also contact your provider using MyChart. We now offer e-Visits for anyone 37 and older to request care online for non-urgent symptoms. For details visit [email or web address].   Also download the MyChart app! Go to the app store, search "MyChart", open the app, select [facility], and log in with your MyChart username and password.  Due to Covid, a mask is required upon entering the hospital/clinic. If you do not have a mask, one will be given to you upon arrival. For doctor visits, patients may have 1 support person aged 76 or older with them. For treatment visits, patients cannot have anyone with them due to current Covid guidelines and our immunocompromised population.

## 2021-09-18 NOTE — Progress Notes (Signed)
Here for 1st cycle paclitaxel/carboplatin/pembrolizumab. He did well with pemborlizumab. He started having chest tightness and difficulty breathing immediately after starting Taxol infusion. He also reported his lips feeling funny, not numb or tingling, but chapped. Taxol was stopped. He was given solumedrol 125 mg IV with resolution of the chest tightness and difficulty breathing. He still had the funny sensation of his lips, which I felt was part of the reaction, so I gave him an additional 20 mg Pepcid IV. After discussion with Dr. Bobby Rumpf, we will proceed with carboplatin today and discuss the possibility of using nab-paclitaxel as a taxane alternative.

## 2021-09-19 ENCOUNTER — Telehealth: Payer: Self-pay

## 2021-09-19 NOTE — Telephone Encounter (Signed)
09/19/21 @ 1115 - I spoke with pt's daughter and reminded her as well of the importance of calling us if temp 100.4 or higher, DAY OR NIGHT. She told me she was writing the temp 100.4 down, so she wouldn't forget it. I also told her if her dad was to develop any symptoms of infection, to call us. She verbalized understanding.    09/19/21 @ 1111- I spoke with pt. He denies N/V, diarrhea, & fever since infusion. Pt states, "I'm just getting up". I reminded pt that he needs to call us if temp gets to 100.4 or higher. I will call pt's daughter as well.

## 2021-09-20 ENCOUNTER — Ambulatory Visit: Payer: Medicare Other

## 2021-09-27 ENCOUNTER — Inpatient Hospital Stay: Payer: Medicare Other

## 2021-09-27 ENCOUNTER — Other Ambulatory Visit: Payer: Self-pay | Admitting: Pharmacist

## 2021-09-27 ENCOUNTER — Encounter: Payer: Self-pay | Admitting: Hematology and Oncology

## 2021-09-27 ENCOUNTER — Inpatient Hospital Stay (INDEPENDENT_AMBULATORY_CARE_PROVIDER_SITE_OTHER): Payer: Medicare Other | Admitting: Hematology and Oncology

## 2021-09-27 ENCOUNTER — Other Ambulatory Visit: Payer: Self-pay

## 2021-09-27 DIAGNOSIS — C3492 Malignant neoplasm of unspecified part of left bronchus or lung: Secondary | ICD-10-CM

## 2021-09-27 DIAGNOSIS — D649 Anemia, unspecified: Secondary | ICD-10-CM

## 2021-09-27 DIAGNOSIS — Z79899 Other long term (current) drug therapy: Secondary | ICD-10-CM | POA: Diagnosis not present

## 2021-09-27 DIAGNOSIS — Z5112 Encounter for antineoplastic immunotherapy: Secondary | ICD-10-CM | POA: Diagnosis not present

## 2021-09-27 DIAGNOSIS — Z5111 Encounter for antineoplastic chemotherapy: Secondary | ICD-10-CM | POA: Diagnosis not present

## 2021-09-27 DIAGNOSIS — C7951 Secondary malignant neoplasm of bone: Secondary | ICD-10-CM | POA: Diagnosis not present

## 2021-09-27 HISTORY — DX: Anemia, unspecified: D64.9

## 2021-09-27 LAB — FERRITIN: Ferritin: 574 ng/mL — ABNORMAL HIGH (ref 24–336)

## 2021-09-27 LAB — CBC
MCV: 80 (ref 80–94)
RBC: 3.4 — AB (ref 3.87–5.11)

## 2021-09-27 LAB — IRON AND TIBC
Iron: 23 ug/dL — ABNORMAL LOW (ref 45–182)
Saturation Ratios: 13 % — ABNORMAL LOW (ref 17.9–39.5)
TIBC: 179 ug/dL — ABNORMAL LOW (ref 250–450)
UIBC: 156 ug/dL

## 2021-09-27 LAB — CBC AND DIFFERENTIAL
HCT: 27 — AB (ref 41–53)
Hemoglobin: 8.1 — AB (ref 13.5–17.5)
Neutrophils Absolute: 3.66
Platelets: 255 (ref 150–399)
WBC: 5.3

## 2021-09-27 LAB — BASIC METABOLIC PANEL
BUN: 12 (ref 4–21)
CO2: 25 — AB (ref 13–22)
Chloride: 106 (ref 99–108)
Creatinine: 0.9 (ref 0.6–1.3)
Glucose: 115
Potassium: 3.8 (ref 3.4–5.3)
Sodium: 139 (ref 137–147)

## 2021-09-27 LAB — HEPATIC FUNCTION PANEL
ALT: 8 — AB (ref 10–40)
AST: 17 (ref 14–40)
Alkaline Phosphatase: 111 (ref 25–125)
Bilirubin, Total: 0.4

## 2021-09-27 LAB — TSH: TSH: 5.26 u[IU]/mL — ABNORMAL HIGH (ref 0.350–4.500)

## 2021-09-27 LAB — CORRECTED CALCIUM (CC13): Calcium, Corrected: 9.2

## 2021-09-27 LAB — COMPREHENSIVE METABOLIC PANEL
Albumin: 3 — AB (ref 3.5–5.0)
Calcium: 8.2 — AB (ref 8.7–10.7)

## 2021-09-27 NOTE — Progress Notes (Signed)
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 He had a rapid onset allergic reaction to paclitaxel with dyspnea and chest tightness which resolved with solumedrol and additional famotidine. He tolerated carboplatin/pembrolizumab well. We will plan to replace paclitaxel with nab-paclitaxel every 3 weeks at a 20% dose reduction. We will also add zoledronic acid every 3 weeks for the bone metastasis. He has a mild headache and nausea today without other neurologic symptoms. He has stable right leg weakness felt to be due to right hip and femur metastases. He can use Tylenol and antiemetics as needed. He and his daughter know to contact us if his symptoms worsen or he develops new symptoms.  We will plan to see him back next week prior to a 2nd cycle of palliative therapy, which will now consist of carboplatin/nab-paclitaxel/pembrolizumab.  Anemia Chronic anemia since August, slightly worsened with chemotherapy. This is most likely due to his disease but as the MCV is low normal, I will add iron studies to further evaluate. He is not symptomatic, so I will not transfuse at this time. We will continue to monitor this.   The patient and his daughter understand the plans discussed today and are in agreement with them.  They know to contact our office if he develops concerns prior to his next appointment.      Marvia Pickles, PA-C  [facility] [address] [place] [address] Dept: [phone] Dept Fax: [phone]   Orders Placed This Encounter  Procedures   CBC and differential    This  external order was created through the Results Console.   CBC    This external order was created through the Results Console.   Basic metabolic panel    This external order was created through the Results Console.   Comprehensive metabolic panel    This external order was created through the Results Console.   Hepatic function panel    This external order was created through the Results Console.   CBC    This order was created through External Result Entry   Ferritin    Standing Status:   Future    Number of Occurrences:   1    Standing Expiration Date:   09/27/2022   Iron and TIBC    Standing Status:   Future    Number of Occurrences:   1    Standing Expiration Date:   09/27/2022   CBC    This order was created through External Result Entry   Corrected Calcium    This order was created through External Result Entry       CHIEF COMPLAINT:  CC: Stage IVA squamous cell lung cancer  Current Treatment:  Carboplatin/paclitaxel/pembrolizumab   HISTORY OF PRESENT ILLNESS:   Oncology History  Squamous cell lung cancer, left ([place])  06/26/2021 Initial Diagnosis   Squamous cell lung cancer, left ([place])   07/11/2021 Cancer Staging   Staging form: Lung, AJCC 8th Edition - Clinical stage from 07/11/2021: Stage IVA (cT3, cN0, cM1b) - Signed by [name], MD on 07/11/2021 Histopathologic type: Squamous cell carcinoma, NOS Stage prefix: Initial diagnosis    09/18/2021 - 09/18/2021  Chemotherapy   Patient is on Treatment Plan : LUNG NSCLC Carboplatin + Paclitaxel + Pembrolizumab q21d x 4 cycles / Pembrolizumab Maintenance Q21D     10/08/2021 -  Chemotherapy   Patient is on Treatment Plan : LUNG NSCLC CARBOplatin + Abraxane + Pembrolizumab q21d X 4 Cycles / Pembrolizumab Maintenance q21d        INTERVAL HISTORY:  Tyrion is here today for repeat clinical assessment after receiving his first cycle of chemotherapy. He had a rapid onset allergic reaction to paclitaxel with dyspnea  and chest tightness which resolved with solumedrol and famotidine.  He went on to complete his carboplatin.  He states he did not have any difficulty with the carboplatin/pembrolizumab.  He notes a mild headache and nausea without vomiting today.  He reports stable right leg weakness, which is attributed to his bony metastasis. He is in a wheelchair due to this.  He denies other neurologic symptoms, such as vision changes or paresthesias.  He denies progressive fatigue concerning for worsening anemia. He denies fevers or chills. He denies pain. His appetite is good. His weight has decreased 3 pounds over last 10 days .  REVIEW OF SYSTEMS:  Review of Systems  Constitutional:  Positive for fatigue (stable). Negative for appetite change, chills, fever and unexpected weight change.  HENT:   Negative for lump/mass, mouth sores and sore throat.   Respiratory:  Negative for cough and shortness of breath.   Cardiovascular:  Negative for chest pain and leg swelling.  Gastrointestinal:  Positive for nausea (mild, today only). Negative for abdominal pain, constipation, diarrhea and vomiting.  Genitourinary:  Negative for difficulty urinating, dysuria, frequency and hematuria.   Musculoskeletal:  Negative for arthralgias, back pain and myalgias.  Skin:  Negative for itching, rash and wound.  Neurological:  Positive for extremity weakness (stable, right lower extremity) and headaches (mild, today only). Negative for dizziness, light-headedness and numbness.  Hematological:  Negative for adenopathy.  Psychiatric/Behavioral:  Negative for depression and sleep disturbance. The patient is not nervous/anxious.     VITALS:  Blood pressure (!) 144/75, pulse 86, temperature 98.4 F (36.9 C), temperature source Oral, resp. rate 20, height 5\' 9"  (1.753 m), weight 133 lb 6.4 oz (60.5 kg), SpO2 93 %.  Wt Readings from Last 3 Encounters:  09/27/21 133 lb 6.4 oz (60.5 kg)  09/18/21 136 lb 1.9 oz (61.7 kg)  09/17/21 136  lb 4 oz (61.8 kg)    Body mass index is 19.7 kg/m.  Performance status (ECOG): 2 - Symptomatic, <50% confined to bed  PHYSICAL EXAM:  Physical Exam Vitals and nursing note reviewed.  Constitutional:      General: He is not in acute distress.    Appearance: Normal appearance. He is normal weight.  HENT:     Head: Normocephalic and atraumatic.     Mouth/Throat:     Mouth: Mucous membranes are moist.     Pharynx: Oropharynx is clear. No oropharyngeal exudate or posterior oropharyngeal erythema.  Eyes:     General: No scleral icterus.    Extraocular Movements: Extraocular movements intact.     Conjunctiva/sclera: Conjunctivae normal.     Pupils: Pupils are equal, round, and reactive to light.  Cardiovascular:     Rate and Rhythm: Normal rate and regular rhythm.     Heart sounds: Normal heart sounds. No murmur heard.   No friction rub. No gallop.  Pulmonary:     Effort: Pulmonary effort is normal.     Breath sounds: Normal  breath sounds. No wheezing, rhonchi or rales.  Abdominal:     General: Bowel sounds are normal. There is no distension.     Palpations: Abdomen is soft. There is no hepatomegaly, splenomegaly or mass.     Tenderness: There is no abdominal tenderness.  Musculoskeletal:        General: Normal range of motion.     Cervical back: Normal range of motion and neck supple. No tenderness.     Right lower leg: No edema.     Left lower leg: No edema.  Lymphadenopathy:     Cervical: No cervical adenopathy.     Upper Body:     Right upper body: No supraclavicular or axillary adenopathy.     Left upper body: No supraclavicular or axillary adenopathy.     Lower Body: No right inguinal adenopathy. No left inguinal adenopathy.  Skin:    General: Skin is warm and dry.     Coloration: Skin is not jaundiced.     Findings: No rash.  Neurological:     Mental Status: He is alert and oriented to person, place, and time.     Cranial Nerves: Cranial nerves 2-12 are intact. No  cranial nerve deficit, dysarthria or facial asymmetry.     Sensory: Sensation is intact.     Motor: Weakness (4+/5 in the right lower extremity) present.  Psychiatric:        Mood and Affect: Mood normal.        Behavior: Behavior normal.        Thought Content: Thought content normal.    LABS:   CBC Latest Ref Rng & Units 09/27/2021 09/17/2021 08/16/2021  WBC - 5.3 5.6 4.1  Hemoglobin 13.5 - 17.5 8.1(A) 8.7(A) 8.4(A)  Hematocrit 41 - 53 27(A) 29(A) 27(A)  Platelets 150 - 399 255 310 228   CMP Latest Ref Rng & Units 09/27/2021 09/17/2021 08/16/2021  Glucose 70 - 99 mg/dL - - -  BUN 4 - 21 12 9 10   Creatinine 0.6 - 1.3 0.9 0.9 1.0  Sodium 137 - 147 139 142 139  Potassium 3.4 - 5.3 3.8 3.1(A) 3.5  Chloride 99 - 108 106 106 106  CO2 13 - 22 25(A) 26(A) 26(A)  Calcium 8.7 - 10.7 8.2(A) 8.6(A) 8.1(A)  Total Protein 6.5 - 8.1 g/dL - - -  Total Bilirubin 0.3 - 1.2 mg/dL - - -  Alkaline Phos 25 - 125 111 120 93  AST 14 - 40 17 17 20   ALT 10 - 40 8(A) 6(A) 8(A)     No results found for: CEA1 / No results found for: CEA1 No results found for: PSA1 No results found for: YYT035 No results found for: WSF681  Lab Results  Component Value Date   TOTALPROTELP 6.7 06/26/2021   No results found for: TIBC, FERRITIN, IRONPCTSAT No results found for: LDH  STUDIES:  No results found.    HISTORY:   Past Medical History:  Diagnosis Date   Anemia    Anemia 09/27/2021   Cancer (HCC)    CHF (congestive heart failure) (HCC)    Chronic kidney disease    COPD (chronic obstructive pulmonary disease) (HCC)    Diverticulosis    GERD (gastroesophageal reflux disease)    Hypertension     Past Surgical History:  Procedure Laterality Date   CRYOTHERAPY  06/26/2021   Procedure: CRYOTHERAPY;  Surgeon: Garner Nash, DO;  Location: [facility];  Service: Pulmonary;;   HEMOSTASIS CONTROL  06/26/2021  Procedure: HEMOSTASIS CONTROL;  Surgeon: Garner Nash, DO;  Location: [facility];   Service: Pulmonary;;   HERNIA REPAIR     PROSTATE SURGERY     PROSTATECTOMY     VIDEO BRONCHOSCOPY Left 06/26/2021   Procedure: VIDEO BRONCHOSCOPY WITHOUT FLUORO;  Surgeon: Garner Nash, DO;  Location: [facility];  Service: Pulmonary;  Laterality: Left;  possible cryotherapy    Family History  Problem Relation Age of Onset   Heart attack Mother    Hypertension Mother    Hypertension Father    Hypertension Sister    Hypertension Sister    Hypertension Sister    Hypertension Sister     Social History:  reports that he quit smoking about 9 months ago. His smoking use included cigarettes. He smoked an average of .25 packs per day. He has never used smokeless tobacco. He reports that he does not currently use alcohol. He reports that he does not use drugs.The patient is accompanied by his daughter today.  Allergies:  Allergies  Allergen Reactions   Paclitaxel Shortness Of Breath    Current Medications: Current Outpatient Medications  Medication Sig Dispense Refill   acetaminophen (TYLENOL) 325 MG tablet Take 2 tablets (650 mg total) by mouth every 6 (six) hours as needed for mild pain or fever (or Fever >/= 101).     albuterol (VENTOLIN HFA) 108 (90 Base) MCG/ACT inhaler INHALE 2 PUFFS BY MOUTH EVERY 4 HOURS     allopurinol (ZYLOPRIM) 100 MG tablet Take 100 mg by mouth daily.     APIXABAN (ELIQUIS) VTE STARTER PACK (10MG  AND 5MG ) Take as directed on package: start with two-5mg  tablets twice daily for 7 days. On day 8, switch to one-5mg  tablet twice daily. 1 each 0   dexamethasone (DECADRON) 4 MG tablet Take 5 tabs the day before and 5 tab the morning of chemotherapy, every 3 weeks, by mouth 60 tablet 0   diclofenac (VOLTAREN) 50 MG EC tablet Take 50 mg by mouth 3 (three) times daily.     ENTRESTO 24-26 MG TAKE 1 TABLET BY MOUTH TWICE DAILY 60 tablet 1   Fluticasone-Umeclidin-Vilant (TRELEGY ELLIPTA) 100-62.5-25 MCG/INH AEPB Inhale into the lungs daily. (Patient not taking:  Reported on 54/65/6812)     folic acid (FOLVITE) 1 MG tablet Take 1 mg by mouth daily.     losartan (COZAAR) 50 MG tablet Take by mouth.     metoprolol succinate (TOPROL XL) 25 MG 24 hr tablet Take 0.5 tablets (12.5 mg total) by mouth daily. 30 tablet 1   ondansetron (ZOFRAN) 4 MG tablet Take 1 tablet (4 mg total) by mouth every 4 (four) hours as needed for nausea. 90 tablet 3   Oxycodone HCl 10 MG TABS Take 1 tablet (10 mg total) by mouth every 4 (four) hours as needed. 120 tablet 0   pantoprazole (PROTONIX) 40 MG tablet Take 40 mg by mouth daily.     potassium chloride SA (KLOR-CON) 20 MEQ tablet Take 1 tablet (20 mEq total) by mouth once for 1 dose. 30 tablet 0   predniSONE (STERAPRED UNI-PAK 21 TAB) 5 MG (21) TBPK tablet See admin instructions. (Patient not taking: Reported on 09/27/2021)     prochlorperazine (COMPAZINE) 10 MG tablet Take 1 tablet (10 mg total) by mouth every 6 (six) hours as needed for nausea or vomiting. 90 tablet 3   thiamine (VITAMIN B-1) 100 MG tablet Take 100 mg by mouth daily.     No current facility-administered medications for this  visit.

## 2021-09-27 NOTE — Assessment & Plan Note (Signed)
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 He had a rapid onset allergic reaction to paclitaxel with dyspnea and chest tightness which resolved with solumedrol and additional famotidine. He tolerated carboplatin/pembrolizumab well. We will plan to replace paclitaxel with nab-paclitaxel every 3 weeks at a 20% dose reduction. We will also add zoledronic acid every 3 weeks for the bone metastasis. He has a mild headache and nausea today without other neurologic symptoms. He has stable right leg weakness felt to be due to right hip and femur metastases. He can use Tylenol and antiemetics as needed. He and his daughter know to contact us if his symptoms worsen or he develops new symptoms.  We will plan to see him back next week prior to a 2nd cycle of palliative therapy, which will now consist of carboplatin/nab-paclitaxel/pembrolizumab.

## 2021-09-27 NOTE — Assessment & Plan Note (Addendum)
Chronic anemia since August, slightly worsened with chemotherapy. This is most likely due to his disease but as the MCV is low normal, I will add iron studies to further evaluate. He is not symptomatic, so I will not transfuse at this time. We will continue to monitor this.

## 2021-09-28 ENCOUNTER — Other Ambulatory Visit: Payer: Medicare Other

## 2021-09-28 ENCOUNTER — Ambulatory Visit: Payer: Medicare Other | Admitting: Hematology and Oncology

## 2021-09-28 LAB — T4: T4, Total: 7.4 ug/dL (ref 4.5–12.0)

## 2021-10-01 NOTE — Progress Notes (Signed)
[facility]  [address] [place],  [place]  [address] [phone]  Clinic Day:  10/05/2021  Referring physician: Maryella Shivers, MD  This document serves as a record of services personally performed by [name], MD. It was created on their behalf by [facility], a trained medical scribe. The creation of this record is based on the scribe's personal observations and the provider's statements to them.  HISTORY OF PRESENT ILLNESS:  The patient is a 87 y.o. male  stage IVA (T3 N0 M1b) squamous cell lung cancer.   He comes in today to be evaluated before heading into his 2nd cycle of chemotherapy.  With his 1st cycle, he had a rapid-onset allergic reaction to paclitaxel, which resolved with solumedrol and famotidine. He tolerated his carboplatin/pembrolizumab well. He comes in today prior to his 2nd cycle of chemotherapy, which will now consist of carboplatin/nab-paclitaxel/pembrolizumab.  Overall, the patient is doing well.  He has already undergone palliative radiation to his right acetabular region.  He still has pain in his right hip region, but it is manageable.  He denies having other symptoms/findings which concern him for overt signs of disease progression.    PHYSICAL EXAM: Blood pressure (!) 148/72, pulse 83, temperature 98.4 F (36.9 C), resp. rate 16, height 5\' 9"  (1.753 m), weight 131 lb 1.6 oz (59.5 kg), SpO2 94 %. Wt Readings from Last 3 Encounters:  10/05/21 131 lb 1.6 oz (59.5 kg)  09/27/21 133 lb 6.4 oz (60.5 kg)  09/18/21 136 lb 1.9 oz (61.7 kg)   Body mass index is 19.36 kg/m. Performance status (ECOG): 2 - Symptomatic, <50% confined to bed Physical Exam Constitutional:      General: He is not in acute distress.    Appearance: Normal appearance. He is normal weight.     Comments: Thin gentleman in a wheelchair  HENT:     Head: Normocephalic and atraumatic.  Eyes:     General: No scleral icterus.    Extraocular  Movements: Extraocular movements intact.     Conjunctiva/sclera: Conjunctivae normal.     Pupils: Pupils are equal, round, and reactive to light.  Cardiovascular:     Rate and Rhythm: Normal rate and regular rhythm.     Pulses: Normal pulses.     Heart sounds: Normal heart sounds. No murmur heard.   No friction rub. No gallop.  Pulmonary:     Effort: Pulmonary effort is normal. No respiratory distress.     Breath sounds: Normal breath sounds.  Abdominal:     General: Bowel sounds are normal. There is no distension.     Palpations: Abdomen is soft. There is no hepatomegaly, splenomegaly or mass.     Tenderness: There is no abdominal tenderness.  Musculoskeletal:        General: Normal range of motion.     Cervical back: Normal range of motion and neck supple.     Right lower leg: No edema.     Left lower leg: No edema.  Lymphadenopathy:     Cervical: No cervical adenopathy.  Skin:    General: Skin is warm and dry.  Neurological:     General: No focal deficit present.     Mental Status: He is alert and oriented to person, place, and time. Mental status is at baseline.  Psychiatric:        Mood and Affect: Mood normal.        Behavior: Behavior normal.  Thought Content: Thought content normal.        Judgment: Judgment normal.    LABS:    ASSESSMENT & PLAN:  An 87 y.o. male with stage IVA (T3 N0 M1b) squamous cell lung cancer, which includes spread of disease to his right hip.  He will proceed with his 2nd cycle of chemotherapy next week, which will now be carboplatin/nab-paclitaxel/pembrolizumab.  His hemoglobin remains low.  His iron studies done recently did not suggest iron deficiency anemia being present.  I will give him Retacrit 40,000 units with each cycle of treatment for his chemotherapy-induced anemia.  He is also taking Zometa for his metastatic bone disease.   will see him back in 3 weeks before he heads into his 3rd cycle of treatment.  The patient understands  all the plans discussed today and is in agreement with them.   I, Rita Ohara, am acting as scribe for [name], MD    I have reviewed this report as typed by the medical scribe, and it is complete and accurate.  Drago Hammonds Macarthur Critchley, MD

## 2021-10-02 ENCOUNTER — Other Ambulatory Visit: Payer: Self-pay | Admitting: Oncology

## 2021-10-02 DIAGNOSIS — I129 Hypertensive chronic kidney disease with stage 1 through stage 4 chronic kidney disease, or unspecified chronic kidney disease: Secondary | ICD-10-CM | POA: Diagnosis not present

## 2021-10-02 DIAGNOSIS — M109 Gout, unspecified: Secondary | ICD-10-CM | POA: Diagnosis not present

## 2021-10-02 DIAGNOSIS — R7303 Prediabetes: Secondary | ICD-10-CM | POA: Diagnosis not present

## 2021-10-05 ENCOUNTER — Encounter: Payer: Self-pay | Admitting: Oncology

## 2021-10-05 ENCOUNTER — Inpatient Hospital Stay: Payer: Medicare Other | Attending: Hematology and Oncology | Admitting: Oncology

## 2021-10-05 ENCOUNTER — Inpatient Hospital Stay: Payer: Medicare Other

## 2021-10-05 ENCOUNTER — Telehealth: Payer: Self-pay | Admitting: Oncology

## 2021-10-05 VITALS — BP 148/72 | HR 83 | Temp 98.4°F | Resp 16 | Ht 69.0 in | Wt 131.1 lb

## 2021-10-05 DIAGNOSIS — Z5112 Encounter for antineoplastic immunotherapy: Secondary | ICD-10-CM | POA: Diagnosis not present

## 2021-10-05 DIAGNOSIS — D6481 Anemia due to antineoplastic chemotherapy: Secondary | ICD-10-CM | POA: Insufficient documentation

## 2021-10-05 DIAGNOSIS — Z5111 Encounter for antineoplastic chemotherapy: Secondary | ICD-10-CM | POA: Diagnosis not present

## 2021-10-05 DIAGNOSIS — C7951 Secondary malignant neoplasm of bone: Secondary | ICD-10-CM | POA: Diagnosis not present

## 2021-10-05 DIAGNOSIS — C3492 Malignant neoplasm of unspecified part of left bronchus or lung: Secondary | ICD-10-CM

## 2021-10-05 DIAGNOSIS — Z79899 Other long term (current) drug therapy: Secondary | ICD-10-CM | POA: Diagnosis not present

## 2021-10-05 DIAGNOSIS — Z5189 Encounter for other specified aftercare: Secondary | ICD-10-CM | POA: Diagnosis not present

## 2021-10-05 LAB — BASIC METABOLIC PANEL
BUN: 12 (ref 4–21)
CO2: 22 (ref 13–22)
Chloride: 108 (ref 99–108)
Creatinine: 0.9 (ref 0.6–1.3)
Glucose: 98
Potassium: 4.1 (ref 3.4–5.3)
Sodium: 140 (ref 137–147)

## 2021-10-05 LAB — CBC AND DIFFERENTIAL
HCT: 27 — AB (ref 41–53)
Hemoglobin: 8 — AB (ref 13.5–17.5)
Neutrophils Absolute: 3.85
Platelets: 233 (ref 150–399)
WBC: 5.2

## 2021-10-05 LAB — CBC: RBC: 3.32 — AB (ref 3.87–5.11)

## 2021-10-05 LAB — TSH: TSH: 3.767 u[IU]/mL (ref 0.350–4.500)

## 2021-10-05 LAB — HEPATIC FUNCTION PANEL
ALT: 8 — AB (ref 10–40)
AST: 17 (ref 14–40)
Alkaline Phosphatase: 101 (ref 25–125)
Bilirubin, Total: 0.3

## 2021-10-05 LAB — COMPREHENSIVE METABOLIC PANEL
Albumin: 3.2 — AB (ref 3.5–5.0)
Calcium: 9.4 (ref 8.7–10.7)

## 2021-10-05 NOTE — Telephone Encounter (Signed)
Per 11/4 los next appt scheduled and given to patient 

## 2021-10-06 LAB — T4: T4, Total: 8.1 ug/dL (ref 4.5–12.0)

## 2021-10-08 ENCOUNTER — Other Ambulatory Visit: Payer: Self-pay | Admitting: Hematology and Oncology

## 2021-10-08 ENCOUNTER — Encounter: Payer: Self-pay | Admitting: Oncology

## 2021-10-08 ENCOUNTER — Other Ambulatory Visit: Payer: Self-pay | Admitting: Pharmacist

## 2021-10-08 MED ORDER — GABAPENTIN 300 MG PO CAPS: 300.0000 mg | ORAL_CAPSULE | Freq: Three times a day (TID) | ORAL | 0 refills | Status: DC

## 2021-10-08 MED FILL — Fosaprepitant Dimeglumine For IV Infusion 150 MG (Base Eq): INTRAVENOUS | Qty: 5 | Status: AC

## 2021-10-08 MED FILL — Paclitaxel Protein-Bound Particles For IV Susp 100 MG: INTRAVENOUS | Qty: 70 | Status: AC

## 2021-10-08 MED FILL — Pembrolizumab IV Soln 100 MG/4ML (25 MG/ML): INTRAVENOUS | Qty: 8 | Status: AC

## 2021-10-08 MED FILL — Zoledronic Acid Inj Conc For IV Infusion 4 MG/5ML: INTRAVENOUS | Qty: 3.75 | Status: AC

## 2021-10-08 MED FILL — Dexamethasone Sodium Phosphate Inj 100 MG/10ML: INTRAMUSCULAR | Qty: 1 | Status: AC

## 2021-10-09 ENCOUNTER — Inpatient Hospital Stay: Payer: Medicare Other

## 2021-10-09 ENCOUNTER — Encounter: Payer: Self-pay | Admitting: Oncology

## 2021-10-09 ENCOUNTER — Other Ambulatory Visit: Payer: Self-pay

## 2021-10-09 VITALS — BP 141/74 | HR 88 | Temp 98.1°F | Resp 20 | Ht 69.0 in | Wt 131.0 lb

## 2021-10-09 DIAGNOSIS — Z5189 Encounter for other specified aftercare: Secondary | ICD-10-CM | POA: Diagnosis not present

## 2021-10-09 DIAGNOSIS — C3492 Malignant neoplasm of unspecified part of left bronchus or lung: Secondary | ICD-10-CM | POA: Diagnosis not present

## 2021-10-09 DIAGNOSIS — D6481 Anemia due to antineoplastic chemotherapy: Secondary | ICD-10-CM | POA: Diagnosis not present

## 2021-10-09 DIAGNOSIS — C7951 Secondary malignant neoplasm of bone: Secondary | ICD-10-CM | POA: Diagnosis not present

## 2021-10-09 DIAGNOSIS — Z5111 Encounter for antineoplastic chemotherapy: Secondary | ICD-10-CM | POA: Diagnosis not present

## 2021-10-09 DIAGNOSIS — Z5112 Encounter for antineoplastic immunotherapy: Secondary | ICD-10-CM | POA: Diagnosis not present

## 2021-10-09 MED ORDER — PALONOSETRON HCL INJECTION 0.25 MG/5ML
0.2500 mg | Freq: Once | INTRAVENOUS | Status: AC
  Administered 2021-10-09: 0.25 mg via INTRAVENOUS
  Filled 2021-10-09: qty 5

## 2021-10-09 MED ORDER — SODIUM CHLORIDE 0.9 % IV SOLN
10.0000 mg | Freq: Once | INTRAVENOUS | Status: AC
  Administered 2021-10-09: 10 mg via INTRAVENOUS
  Filled 2021-10-09: qty 10

## 2021-10-09 MED ORDER — SODIUM CHLORIDE 0.9 % IV SOLN: Freq: Once | INTRAVENOUS | Status: AC

## 2021-10-09 MED ORDER — SODIUM CHLORIDE 0.9% FLUSH
10.0000 mL | INTRAVENOUS | Status: DC | PRN
  Administered 2021-10-09: 10 mL

## 2021-10-09 MED ORDER — SODIUM CHLORIDE 0.9 % IV SOLN
200.0000 mg | Freq: Once | INTRAVENOUS | Status: AC
  Administered 2021-10-09: 200 mg via INTRAVENOUS
  Filled 2021-10-09: qty 8

## 2021-10-09 MED ORDER — SODIUM CHLORIDE 0.9 % IV SOLN
150.0000 mg | Freq: Once | INTRAVENOUS | Status: AC
  Administered 2021-10-09: 150 mg via INTRAVENOUS
  Filled 2021-10-09: qty 150

## 2021-10-09 MED ORDER — ZOLEDRONIC ACID 4 MG/5ML IV CONC
3.0000 mg | Freq: Once | INTRAVENOUS | Status: AC
  Administered 2021-10-09: 3 mg via INTRAVENOUS
  Filled 2021-10-09: qty 3.75

## 2021-10-09 MED ORDER — HEPARIN SOD (PORK) LOCK FLUSH 100 UNIT/ML IV SOLN
500.0000 [IU] | Freq: Once | INTRAVENOUS | Status: AC | PRN
  Administered 2021-10-09: 500 [IU]

## 2021-10-09 MED ORDER — EPOETIN ALFA-EPBX 40000 UNIT/ML IJ SOLN
40000.0000 [IU] | Freq: Once | INTRAMUSCULAR | Status: AC
  Administered 2021-10-09: 40000 [IU] via SUBCUTANEOUS
  Filled 2021-10-09: qty 1

## 2021-10-09 MED ORDER — SODIUM CHLORIDE 0.9 % IV SOLN
284.8000 mg | Freq: Once | INTRAVENOUS | Status: AC
  Administered 2021-10-09: 280 mg via INTRAVENOUS
  Filled 2021-10-09: qty 28

## 2021-10-09 MED ORDER — PACLITAXEL PROTEIN-BOUND CHEMO INJECTION 100 MG
208.0000 mg/m2 | Freq: Once | INTRAVENOUS | Status: AC
  Administered 2021-10-09: 350 mg via INTRAVENOUS
  Filled 2021-10-09: qty 70

## 2021-10-09 NOTE — Patient Instructions (Signed)
Nanoparticle Albumin-Bound Paclitaxel injection What is this medication? NANOPARTICLE ALBUMIN-BOUND PACLITAXEL (Na no PAHR ti kuhl  al BYOO muhn-bound  PAK li TAX el) is a chemotherapy drug. It targets fast dividing cells, like cancer cells, and causes these cells to die. This medicine is used to treat advanced breast cancer, lung cancer, and pancreatic cancer. This medicine may be used for other purposes; ask your health care provider or pharmacist if you have questions. COMMON BRAND NAME(S): Abraxane What should I tell my care team before I take this medication? They need to know if you have any of these conditions: kidney disease liver disease low blood counts, like low white cell, platelet, or red cell counts lung or breathing disease, like asthma tingling of the fingers or toes, or other nerve disorder an unusual or allergic reaction to paclitaxel, albumin, other chemotherapy, other medicines, foods, dyes, or preservatives pregnant or trying to get pregnant breast-feeding How should I use this medication? This drug is given as an infusion into a vein. It is administered in a hospital or clinic by a specially trained health care professional. Talk to your pediatrician regarding the use of this medicine in children. Special care may be needed. Overdosage: If you think you have taken too much of this medicine contact a poison control center or emergency room at once. NOTE: This medicine is only for you. Do not share this medicine with others. What if I miss a dose? It is important not to miss your dose. Call your doctor or health care professional if you are unable to keep an appointment. What may interact with this medication? This medicine may interact with the following medications: antiviral medicines for hepatitis, HIV or AIDS certain antibiotics like erythromycin and clarithromycin certain medicines for fungal infections like ketoconazole and itraconazole certain medicines for  seizures like carbamazepine, phenobarbital, phenytoin gemfibrozil nefazodone rifampin St. John's wort This list may not describe all possible interactions. Give your health care provider a list of all the medicines, herbs, non-prescription drugs, or dietary supplements you use. Also tell them if you smoke, drink alcohol, or use illegal drugs. Some items may interact with your medicine. What should I watch for while using this medication? Your condition will be monitored carefully while you are receiving this medicine. You will need important blood work done while you are taking this medicine. This medicine can cause serious allergic reactions. If you experience allergic reactions like skin rash, itching or hives, swelling of the face, lips, or tongue, tell your doctor or health care professional right away. In some cases, you may be given additional medicines to help with side effects. Follow all directions for their use. This drug may make you feel generally unwell. This is not uncommon, as chemotherapy can affect healthy cells as well as cancer cells. Report any side effects. Continue your course of treatment even though you feel ill unless your doctor tells you to stop. Call your doctor or health care professional for advice if you get a fever, chills or sore throat, or other symptoms of a cold or flu. Do not treat yourself. This drug decreases your body's ability to fight infections. Try to avoid being around people who are sick. This medicine may increase your risk to bruise or bleed. Call your doctor or health care professional if you notice any unusual bleeding. Be careful brushing and flossing your teeth or using a toothpick because you may get an infection or bleed more easily. If you have any dental work done, tell  your dentist you are receiving this medicine. Avoid taking products that contain aspirin, acetaminophen, ibuprofen, naproxen, or ketoprofen unless instructed by your doctor. These  medicines may hide a fever. Do not become pregnant while taking this medicine or for 6 months after stopping it. Women should inform their doctor if they wish to become pregnant or think they might be pregnant. Men should not father a child while taking this medicine or for 3 months after stopping it. There is a potential for serious side effects to an unborn child. Talk to your health care professional or pharmacist for more information. Do not breast-feed an infant while taking this medicine or for 2 weeks after stopping it. This medicine may interfere with the ability to get pregnant or to father a child. You should talk to your doctor or health care professional if you are concerned about your fertility. What side effects may I notice from receiving this medication? Side effects that you should report to your doctor or health care professional as soon as possible: allergic reactions like skin rash, itching or hives, swelling of the face, lips, or tongue breathing problems changes in vision fast, irregular heartbeat low blood pressure mouth sores pain, tingling, numbness in the hands or feet signs of decreased platelets or bleeding - bruising, pinpoint red spots on the skin, black, tarry stools, blood in the urine signs of decreased red blood cells - unusually weak or tired, feeling faint or lightheaded, falls signs of infection - fever or chills, cough, sore throat, pain or difficulty passing urine signs and symptoms of liver injury like dark yellow or brown urine; general ill feeling or flu-like symptoms; light-colored stools; loss of appetite; nausea; right upper belly pain; unusually weak or tired; yellowing of the eyes or skin swelling of the ankles, feet, hands unusually slow heartbeat Side effects that usually do not require medical attention (report to your doctor or health care professional if they continue or are bothersome): diarrhea hair loss loss of appetite nausea,  vomiting tiredness This list may not describe all possible side effects. Call your doctor for medical advice about side effects. You may report side effects to FDA at 1-800-FDA-1088. Where should I keep my medication? This drug is given in a hospital or clinic and will not be stored at home. NOTE: This sheet is a summary. It may not cover all possible information. If you have questions about this medicine, talk to your doctor, pharmacist, or health care provider.  2022 [employer] Standard (2017-07-22 00:00:00) Carboplatin injection What is this medication? CARBOPLATIN (KAR boe pla tin) is a chemotherapy drug. It targets fast dividing cells, like cancer cells, and causes these cells to die. This medicine is used to treat ovarian cancer and many other cancers. This medicine may be used for other purposes; ask your health care provider or pharmacist if you have questions. COMMON BRAND NAME(S): Paraplatin What should I tell my care team before I take this medication? They need to know if you have any of these conditions: blood disorders hearing problems kidney disease recent or ongoing radiation therapy an unusual or allergic reaction to carboplatin, cisplatin, other chemotherapy, other medicines, foods, dyes, or preservatives pregnant or trying to get pregnant breast-feeding How should I use this medication? This drug is usually given as an infusion into a vein. It is administered in a hospital or clinic by a specially trained health care professional. Talk to your pediatrician regarding the use of this medicine in children. Special care may be needed. Overdosage:  If you think you have taken too much of this medicine contact a poison control center or emergency room at once. NOTE: This medicine is only for you. Do not share this medicine with others. What if I miss a dose? It is important not to miss a dose. Call your doctor or health care professional if you are unable to keep an  appointment. What may interact with this medication? medicines for seizures medicines to increase blood counts like filgrastim, pegfilgrastim, sargramostim some antibiotics like amikacin, gentamicin, neomycin, streptomycin, tobramycin vaccines Talk to your doctor or health care professional before taking any of these medicines: acetaminophen aspirin ibuprofen ketoprofen naproxen This list may not describe all possible interactions. Give your health care provider a list of all the medicines, herbs, non-prescription drugs, or dietary supplements you use. Also tell them if you smoke, drink alcohol, or use illegal drugs. Some items may interact with your medicine. What should I watch for while using this medication? Your condition will be monitored carefully while you are receiving this medicine. You will need important blood work done while you are taking this medicine. This drug may make you feel generally unwell. This is not uncommon, as chemotherapy can affect healthy cells as well as cancer cells. Report any side effects. Continue your course of treatment even though you feel ill unless your doctor tells you to stop. In some cases, you may be given additional medicines to help with side effects. Follow all directions for their use. Call your doctor or health care professional for advice if you get a fever, chills or sore throat, or other symptoms of a cold or flu. Do not treat yourself. This drug decreases your body's ability to fight infections. Try to avoid being around people who are sick. This medicine may increase your risk to bruise or bleed. Call your doctor or health care professional if you notice any unusual bleeding. Be careful brushing and flossing your teeth or using a toothpick because you may get an infection or bleed more easily. If you have any dental work done, tell your dentist you are receiving this medicine. Avoid taking products that contain aspirin, acetaminophen,  ibuprofen, naproxen, or ketoprofen unless instructed by your doctor. These medicines may hide a fever. Do not become pregnant while taking this medicine. Women should inform their doctor if they wish to become pregnant or think they might be pregnant. There is a potential for serious side effects to an unborn child. Talk to your health care professional or pharmacist for more information. Do not breast-feed an infant while taking this medicine. What side effects may I notice from receiving this medication? Side effects that you should report to your doctor or health care professional as soon as possible: allergic reactions like skin rash, itching or hives, swelling of the face, lips, or tongue signs of infection - fever or chills, cough, sore throat, pain or difficulty passing urine signs of decreased platelets or bleeding - bruising, pinpoint red spots on the skin, black, tarry stools, nosebleeds signs of decreased red blood cells - unusually weak or tired, fainting spells, lightheadedness breathing problems changes in hearing changes in vision chest pain high blood pressure low blood counts - This drug may decrease the number of white blood cells, red blood cells and platelets. You may be at increased risk for infections and bleeding. nausea and vomiting pain, swelling, redness or irritation at the injection site pain, tingling, numbness in the hands or feet problems with balance, talking, walking trouble passing  urine or change in the amount of urine Side effects that usually do not require medical attention (report to your doctor or health care professional if they continue or are bothersome): hair loss loss of appetite metallic taste in the mouth or changes in taste This list may not describe all possible side effects. Call your doctor for medical advice about side effects. You may report side effects to FDA at 1-800-FDA-1088. Where should I keep my medication? This drug is given in a  hospital or clinic and will not be stored at home. NOTE: This sheet is a summary. It may not cover all possible information. If you have questions about this medicine, talk to your doctor, pharmacist, or health care provider.  2022 [employer]/Gold Standard (2008-04-27 00:00:00) Pembrolizumab injection What is this medication? PEMBROLIZUMAB (pem broe liz ue mab) is a monoclonal antibody. It is used to treat certain types of cancer. This medicine may be used for other purposes; ask your health care provider or pharmacist if you have questions. COMMON BRAND NAME(S): Keytruda What should I tell my care team before I take this medication? They need to know if you have any of these conditions: autoimmune diseases like Crohn's disease, ulcerative colitis, or lupus have had or planning to have an allogeneic stem cell transplant (uses someone else's stem cells) history of organ transplant history of chest radiation nervous system problems like myasthenia gravis or Guillain-Barre syndrome an unusual or allergic reaction to pembrolizumab, other medicines, foods, dyes, or preservatives pregnant or trying to get pregnant breast-feeding How should I use this medication? This medicine is for infusion into a vein. It is given by a health care professional in a hospital or clinic setting. A special MedGuide will be given to you before each treatment. Be sure to read this information carefully each time. Talk to your pediatrician regarding the use of this medicine in children. While this drug may be prescribed for children as young as 6 months for selected conditions, precautions do apply. Overdosage: If you think you have taken too much of this medicine contact a poison control center or emergency room at once. NOTE: This medicine is only for you. Do not share this medicine with others. What if I miss a dose? It is important not to miss your dose. Call your doctor or health care professional if you are unable  to keep an appointment. What may interact with this medication? Interactions have not been studied. This list may not describe all possible interactions. Give your health care provider a list of all the medicines, herbs, non-prescription drugs, or dietary supplements you use. Also tell them if you smoke, drink alcohol, or use illegal drugs. Some items may interact with your medicine. What should I watch for while using this medication? Your condition will be monitored carefully while you are receiving this medicine. You may need blood work done while you are taking this medicine. Do not become pregnant while taking this medicine or for 4 months after stopping it. Women should inform their doctor if they wish to become pregnant or think they might be pregnant. There is a potential for serious side effects to an unborn child. Talk to your health care professional or pharmacist for more information. Do not breast-feed an infant while taking this medicine or for 4 months after the last dose. What side effects may I notice from receiving this medication? Side effects that you should report to your doctor or health care professional as soon as possible: allergic reactions like  skin rash, itching or hives, swelling of the face, lips, or tongue bloody or black, tarry breathing problems changes in vision chest pain chills confusion constipation cough diarrhea dizziness or feeling faint or lightheaded fast or irregular heartbeat fever flushing joint pain low blood counts - this medicine may decrease the number of white blood cells, red blood cells and platelets. You may be at increased risk for infections and bleeding. muscle pain muscle weakness pain, tingling, numbness in the hands or feet persistent headache redness, blistering, peeling or loosening of the skin, including inside the mouth signs and symptoms of high blood sugar such as dizziness; dry mouth; dry skin; fruity breath; nausea;  stomach pain; increased hunger or thirst; increased urination signs and symptoms of kidney injury like trouble passing urine or change in the amount of urine signs and symptoms of liver injury like dark urine, light-colored stools, loss of appetite, nausea, right upper belly pain, yellowing of the eyes or skin sweating swollen lymph nodes weight loss Side effects that usually do not require medical attention (report to your doctor or health care professional if they continue or are bothersome): decreased appetite hair loss tiredness This list may not describe all possible side effects. Call your doctor for medical advice about side effects. You may report side effects to FDA at 1-800-FDA-1088. Where should I keep my medication? This drug is given in a hospital or clinic and will not be stored at home. NOTE: This sheet is a summary. It may not cover all possible information. If you have questions about this medicine, talk to your doctor, pharmacist, or health care provider.  2022 [employer] Standard (2021-08-07 00:00:00) Epoetin Alfa injection What is this medication? EPOETIN ALFA (e POE e tin AL fa) helps your body make more red blood cells. This medicine is used to treat anemia caused by chronic kidney disease, cancer chemotherapy, or HIV-therapy. It may also be used before surgery if you have anemia. This medicine may be used for other purposes; ask your health care provider or pharmacist if you have questions. COMMON BRAND NAME(S): Epogen, Procrit, Retacrit What should I tell my care team before I take this medication? They need to know if you have any of these conditions: cancer heart disease high blood pressure history of blood clots history of stroke low levels of folate, iron, or vitamin B12 in the blood seizures an unusual or allergic reaction to erythropoietin, albumin, benzyl alcohol, hamster proteins, other medicines, foods, dyes, or preservatives pregnant or trying to  get pregnant breast-feeding How should I use this medication? This medicine is for injection into a vein or under the skin. It is usually given by a health care professional in a hospital or clinic setting. If you get this medicine at home, you will be taught how to prepare and give this medicine. Use exactly as directed. Take your medicine at regular intervals. Do not take your medicine more often than directed. It is important that you put your used needles and syringes in a special sharps container. Do not put them in a trash can. If you do not have a sharps container, call your pharmacist or healthcare provider to get one. A special MedGuide will be given to you by the pharmacist with each prescription and refill. Be sure to read this information carefully each time. Talk to your pediatrician regarding the use of this medicine in children. While this drug may be prescribed for selected conditions, precautions do apply. Overdosage: If you think you have taken too  much of this medicine contact a poison control center or emergency room at once. NOTE: This medicine is only for you. Do not share this medicine with others. What if I miss a dose? If you miss a dose, take it as soon as you can. If it is almost time for your next dose, take only that dose. Do not take double or extra doses. What may interact with this medication? Interactions have not been studied. This list may not describe all possible interactions. Give your health care provider a list of all the medicines, herbs, non-prescription drugs, or dietary supplements you use. Also tell them if you smoke, drink alcohol, or use illegal drugs. Some items may interact with your medicine. What should I watch for while using this medication? Your condition will be monitored carefully while you are receiving this medicine. You may need blood work done while you are taking this medicine. This medicine may cause a decrease in vitamin B6. You should  make sure that you get enough vitamin B6 while you are taking this medicine. Discuss the foods you eat and the vitamins you take with your health care professional. What side effects may I notice from receiving this medication? Side effects that you should report to your doctor or health care professional as soon as possible: allergic reactions like skin rash, itching or hives, swelling of the face, lips, or tongue seizures signs and symptoms of a blood clot such as breathing problems; changes in vision; chest pain; severe, sudden headache; pain, swelling, warmth in the leg; trouble speaking; sudden numbness or weakness of the face, arm or leg signs and symptoms of a stroke like changes in vision; confusion; trouble speaking or understanding; severe headaches; sudden numbness or weakness of the face, arm or leg; trouble walking; dizziness; loss of balance or coordination Side effects that usually do not require medical attention (report to your doctor or health care professional if they continue or are bothersome): chills cough dizziness fever headaches joint pain muscle cramps muscle pain nausea, vomiting pain, redness, or irritation at site where injected This list may not describe all possible side effects. Call your doctor for medical advice about side effects. You may report side effects to FDA at 1-800-FDA-1088. Where should I keep my medication? Keep out of the reach of children. Store in a refrigerator between 2 and 8 degrees C (36 and 46 degrees F). Do not freeze or shake. Throw away any unused portion if using a single-dose vial. Multi-dose vials can be kept in the refrigerator for up to 21 days after the initial dose. Throw away unused medicine. NOTE: This sheet is a summary. It may not cover all possible information. If you have questions about this medicine, talk to your doctor, pharmacist, or health care provider.  2022 [employer] Standard (2017-07-22 00:00:00) Zoledronic  Acid Injection (Hypercalcemia, Oncology) What is this medication? ZOLEDRONIC ACID (ZOE le dron ik AS id) slows calcium loss from bones. It high calcium levels in the blood from some kinds of cancer. It may be used in other people at risk for bone loss. This medicine may be used for other purposes; ask your health care provider or pharmacist if you have questions. COMMON BRAND NAME(S): Zometa What should I tell my care team before I take this medication? They need to know if you have any of these conditions: cancer dehydration dental disease kidney disease liver disease low levels of calcium in the blood lung or breathing disease (asthma) receiving steroids like dexamethasone or  prednisone an unusual or allergic reaction to zoledronic acid, other medicines, foods, dyes, or preservatives pregnant or trying to get pregnant breast-feeding How should I use this medication? This drug is injected into a vein. It is given by a health care provider in a hospital or clinic setting. Talk to your health care provider about the use of this drug in children. Special care may be needed. Overdosage: If you think you have taken too much of this medicine contact a poison control center or emergency room at once. NOTE: This medicine is only for you. Do not share this medicine with others. What if I miss a dose? Keep appointments for follow-up doses. It is important not to miss your dose. Call your health care provider if you are unable to keep an appointment. What may interact with this medication? certain antibiotics given by injection NSAIDs, medicines for pain and inflammation, like ibuprofen or naproxen some diuretics like bumetanide, furosemide teriparatide thalidomide This list may not describe all possible interactions. Give your health care provider a list of all the medicines, herbs, non-prescription drugs, or dietary supplements you use. Also tell them if you smoke, drink alcohol, or use  illegal drugs. Some items may interact with your medicine. What should I watch for while using this medication? Visit your health care provider for regular checks on your progress. It may be some time before you see the benefit from this drug. Some people who take this drug have severe bone, joint, or muscle pain. This drug may also increase your risk for jaw problems or a broken thigh bone. Tell your health care provider right away if you have severe pain in your jaw, bones, joints, or muscles. Tell you health care provider if you have any pain that does not go away or that gets worse. Tell your dentist and dental surgeon that you are taking this drug. You should not have major dental surgery while on this drug. See your dentist to have a dental exam and fix any dental problems before starting this drug. Take good care of your teeth while on this drug. Make sure you see your dentist for regular follow-up appointments. You should make sure you get enough calcium and vitamin D while you are taking this drug. Discuss the foods you eat and the vitamins you take with your health care provider. Check with your health care provider if you have severe diarrhea, nausea, and vomiting, or if you sweat a lot. The loss of too much body fluid may make it dangerous for you to take this drug. You may need blood work done while you are taking this drug. Do not become pregnant while taking this drug. Women should inform their health care provider if they wish to become pregnant or think they might be pregnant. There is potential for serious harm to an unborn child. Talk to your health care provider for more information. What side effects may I notice from receiving this medication? Side effects that you should report to your doctor or health care provider as soon as possible: allergic reactions (skin rash, itching or hives; swelling of the face, lips, or tongue) bone pain infection (fever, chills, cough, sore throat,  pain or trouble passing urine) jaw pain, especially after dental work joint pain kidney injury (trouble passing urine or change in the amount of urine) low blood pressure (dizziness; feeling faint or lightheaded, falls; unusually weak or tired) low calcium levels (fast heartbeat; muscle cramps or pain; pain, tingling, or numbness in the  hands or feet; seizures) low magnesium levels (fast, irregular heartbeat; muscle cramp or pain; muscle weakness; tremors; seizures) low red blood cell counts (trouble breathing; feeling faint; lightheaded, falls; unusually weak or tired) muscle pain redness, blistering, peeling, or loosening of the skin, including inside the mouth severe diarrhea swelling of the ankles, feet, hands trouble breathing Side effects that usually do not require medical attention (report to your doctor or health care provider if they continue or are bothersome): anxious constipation coughing depressed mood eye irritation, itching, or pain fever general ill feeling or flu-like symptoms nausea pain, redness, or irritation at site where injected trouble sleeping This list may not describe all possible side effects. Call your doctor for medical advice about side effects. You may report side effects to FDA at 1-800-FDA-1088. Where should I keep my medication? This drug is given in a hospital or clinic. It will not be stored at home. NOTE: This sheet is a summary. It may not cover all possible information. If you have questions about this medicine, talk to your doctor, pharmacist, or health care provider.  2022 Elsevier/Gold Standard (2021-08-07 00:00:00)

## 2021-10-09 NOTE — Addendum Note (Signed)
Addended by: Juanetta Beets on: 10/09/2021 09:17 AM   Modules accepted: Orders

## 2021-10-11 ENCOUNTER — Other Ambulatory Visit: Payer: Self-pay

## 2021-10-11 ENCOUNTER — Inpatient Hospital Stay: Payer: Medicare Other

## 2021-10-11 VITALS — BP 114/53 | HR 98 | Temp 98.1°F | Resp 18 | Ht 69.0 in | Wt 132.1 lb

## 2021-10-11 DIAGNOSIS — C3492 Malignant neoplasm of unspecified part of left bronchus or lung: Secondary | ICD-10-CM | POA: Diagnosis not present

## 2021-10-11 DIAGNOSIS — C7951 Secondary malignant neoplasm of bone: Secondary | ICD-10-CM | POA: Diagnosis not present

## 2021-10-11 DIAGNOSIS — Z5112 Encounter for antineoplastic immunotherapy: Secondary | ICD-10-CM | POA: Diagnosis not present

## 2021-10-11 DIAGNOSIS — Z5111 Encounter for antineoplastic chemotherapy: Secondary | ICD-10-CM | POA: Diagnosis not present

## 2021-10-11 DIAGNOSIS — Z5189 Encounter for other specified aftercare: Secondary | ICD-10-CM | POA: Diagnosis not present

## 2021-10-11 DIAGNOSIS — D6481 Anemia due to antineoplastic chemotherapy: Secondary | ICD-10-CM | POA: Diagnosis not present

## 2021-10-11 MED ORDER — PEGFILGRASTIM-CBQV 6 MG/0.6ML ~~LOC~~ SOSY
6.0000 mg | PREFILLED_SYRINGE | Freq: Once | SUBCUTANEOUS | Status: AC
  Administered 2021-10-11: 6 mg via SUBCUTANEOUS
  Filled 2021-10-11: qty 0.6

## 2021-10-11 NOTE — Patient Instructions (Signed)

## 2021-10-18 ENCOUNTER — Other Ambulatory Visit: Payer: Self-pay | Admitting: Hematology and Oncology

## 2021-10-18 NOTE — Progress Notes (Signed)
[facility]  [address] [place],  [place]  [address] [phone]  Clinic Day:  10/29/2021  Referring physician: Maryella Shivers, MD  This document serves as a record of services personally performed by [name], MD. It was created on their behalf by [facility] E, a trained medical scribe. The creation of this record is based on the scribe's personal observations and the provider's statements to them.  HISTORY OF PRESENT ILLNESS:  The patient is a 87 y.o. male  stage IVA (T3 N0 M1b) squamous cell lung cancer.   He comes in today prior to his 3rd cycle of chemotherapy, which will now consists of carboplatin/nab-paclitaxel/pembrolizumab.  Overall, the patient has been doing well.  He has already undergone palliative radiation to his right acetabular region.  He still has pain in his right hip region, but it is manageable with his narcotic pain medication.  He denies having other symptoms/findings which concern him for overt signs of disease progression.    PHYSICAL EXAM: Blood pressure (!) 124/57, pulse 72, temperature 98.5 F (36.9 C), resp. rate 16, height 5\' 9"  (1.753 m), weight 133 lb 9.6 oz (60.6 kg), SpO2 97 %. Wt Readings from Last 3 Encounters:  10/29/21 133 lb 9.6 oz (60.6 kg)  10/11/21 132 lb 1.9 oz (59.9 kg)  10/09/21 131 lb (59.4 kg)   Body mass index is 19.73 kg/m. Performance status (ECOG): 2 - Symptomatic, <50% confined to bed Physical Exam Constitutional:      General: He is not in acute distress.    Appearance: Normal appearance. He is normal weight.     Comments: Thin gentleman in a wheelchair  HENT:     Head: Normocephalic and atraumatic.  Eyes:     General: No scleral icterus.    Extraocular Movements: Extraocular movements intact.     Conjunctiva/sclera: Conjunctivae normal.     Pupils: Pupils are equal, round, and reactive to light.  Cardiovascular:     Rate and Rhythm: Normal rate and regular rhythm.      Pulses: Normal pulses.     Heart sounds: Normal heart sounds. No murmur heard.   No friction rub. No gallop.  Pulmonary:     Effort: Pulmonary effort is normal. No respiratory distress.     Breath sounds: Normal breath sounds.  Abdominal:     General: Bowel sounds are normal. There is no distension.     Palpations: Abdomen is soft. There is no hepatomegaly, splenomegaly or mass.     Tenderness: There is no abdominal tenderness.  Musculoskeletal:        General: Normal range of motion.     Cervical back: Normal range of motion and neck supple.     Right lower leg: No edema.     Left lower leg: No edema.  Lymphadenopathy:     Cervical: No cervical adenopathy.  Skin:    General: Skin is warm and dry.  Neurological:     General: No focal deficit present.     Mental Status: He is alert and oriented to person, place, and time. Mental status is at baseline.  Psychiatric:        Mood and Affect: Mood normal.        Behavior: Behavior normal.        Thought Content: Thought content normal.        Judgment: Judgment normal.    LABS:    Latest Reference Range & Units 10/29/21 00:00  Sodium 137 -  147  141  Potassium 3.4 - 5.3  4.1  Chloride 99 - 108  110 !  CO2 13 - 22  25 !  Glucose  125  BUN 4 - 21  13  Creatinine 0.6 - 1.3  1.0  Calcium 8.7 - 10.7  9.0  Alkaline Phosphatase 25 - 125  140 !  Albumin 3.5 - 5.0  3.1 !  AST 14 - 40  17  ALT 10 - 40  10  Bilirubin, Total  0.3  WBC  20.8  RBC 3.87 - 5.11  3.12 !  Hemoglobin 13.5 - 17.5  7.9 !  HCT 41 - 53  26 !  Platelets 150 - 399  200  NEUT#  18.51    ASSESSMENT & PLAN:  An 87 y.o. male with stage IVA (T3 N0 M1b) squamous cell lung cancer, which includes spread of disease to his right hip.  He will proceed with his 3rd cycle of chemotherapy this week, which is now carboplatin/nab-paclitaxel/pembrolizumab.  His hemoglobin remains low.  I will continue to give him Retacrit 40,000 units with each cycle of treatment for his  chemotherapy-induced anemia.  He will continue taking Zometa for his metastatic bone disease.  I will see him back in 3 weeks before he heads into his 4th cycle of treatment.  The patient understands all the plans discussed today and is in agreement with them.   I, Rita Ohara, am acting as scribe for [name], MD    I have reviewed this report as typed by the medical scribe, and it is complete and accurate.  Annya Lizana Macarthur Critchley, MD

## 2021-10-29 ENCOUNTER — Telehealth: Payer: Self-pay | Admitting: Oncology

## 2021-10-29 ENCOUNTER — Inpatient Hospital Stay (INDEPENDENT_AMBULATORY_CARE_PROVIDER_SITE_OTHER): Payer: Medicare Other | Admitting: Oncology

## 2021-10-29 ENCOUNTER — Inpatient Hospital Stay: Payer: Medicare Other

## 2021-10-29 ENCOUNTER — Encounter: Payer: Self-pay | Admitting: Oncology

## 2021-10-29 ENCOUNTER — Other Ambulatory Visit: Payer: Self-pay | Admitting: Pharmacist

## 2021-10-29 VITALS — BP 124/57 | HR 72 | Temp 98.5°F | Resp 16 | Ht 69.0 in | Wt 133.6 lb

## 2021-10-29 DIAGNOSIS — C3492 Malignant neoplasm of unspecified part of left bronchus or lung: Secondary | ICD-10-CM

## 2021-10-29 DIAGNOSIS — Z5189 Encounter for other specified aftercare: Secondary | ICD-10-CM | POA: Diagnosis not present

## 2021-10-29 DIAGNOSIS — D6481 Anemia due to antineoplastic chemotherapy: Secondary | ICD-10-CM | POA: Diagnosis not present

## 2021-10-29 DIAGNOSIS — Z5112 Encounter for antineoplastic immunotherapy: Secondary | ICD-10-CM | POA: Diagnosis not present

## 2021-10-29 DIAGNOSIS — Z5111 Encounter for antineoplastic chemotherapy: Secondary | ICD-10-CM | POA: Diagnosis not present

## 2021-10-29 DIAGNOSIS — C7951 Secondary malignant neoplasm of bone: Secondary | ICD-10-CM | POA: Diagnosis not present

## 2021-10-29 LAB — CBC: RBC: 3.12 — AB (ref 3.87–5.11)

## 2021-10-29 LAB — COMPREHENSIVE METABOLIC PANEL
Albumin: 3.1 — AB (ref 3.5–5.0)
Calcium: 9 (ref 8.7–10.7)

## 2021-10-29 LAB — CBC AND DIFFERENTIAL
HCT: 26 — AB (ref 41–53)
Hemoglobin: 7.9 — AB (ref 13.5–17.5)
Neutrophils Absolute: 18.51
Platelets: 200 (ref 150–399)
WBC: 20.8

## 2021-10-29 LAB — BASIC METABOLIC PANEL
BUN: 13 (ref 4–21)
CO2: 25 — AB (ref 13–22)
Chloride: 110 — AB (ref 99–108)
Creatinine: 1 (ref 0.6–1.3)
Glucose: 125
Potassium: 4.1 (ref 3.4–5.3)
Sodium: 141 (ref 137–147)

## 2021-10-29 LAB — TSH: TSH: 2.898 u[IU]/mL (ref 0.350–4.500)

## 2021-10-29 LAB — HEPATIC FUNCTION PANEL
ALT: 10 (ref 10–40)
AST: 17 (ref 14–40)
Alkaline Phosphatase: 140 — AB (ref 25–125)
Bilirubin, Total: 0.3

## 2021-10-29 NOTE — Progress Notes (Signed)
Hold zoledronic acid on 10/30/21 due to hypocalcemia (calcium = 8.1) per treatment conditions.  Ok to proceed with chemo despite hemoglobin = 7.9 per Dr. Bobby Rumpf.  Pt will continue retacrit injections.

## 2021-10-29 NOTE — Telephone Encounter (Signed)
Per 11/28 los next appt scheduled and given to patient

## 2021-10-30 ENCOUNTER — Inpatient Hospital Stay: Payer: Medicare Other

## 2021-10-30 ENCOUNTER — Other Ambulatory Visit: Payer: Self-pay

## 2021-10-30 VITALS — BP 127/76 | HR 90 | Temp 97.6°F | Resp 18 | Ht 69.0 in | Wt 134.0 lb

## 2021-10-30 DIAGNOSIS — C7951 Secondary malignant neoplasm of bone: Secondary | ICD-10-CM | POA: Diagnosis not present

## 2021-10-30 DIAGNOSIS — C3492 Malignant neoplasm of unspecified part of left bronchus or lung: Secondary | ICD-10-CM | POA: Diagnosis not present

## 2021-10-30 DIAGNOSIS — Z5112 Encounter for antineoplastic immunotherapy: Secondary | ICD-10-CM | POA: Diagnosis not present

## 2021-10-30 DIAGNOSIS — D6481 Anemia due to antineoplastic chemotherapy: Secondary | ICD-10-CM | POA: Diagnosis not present

## 2021-10-30 DIAGNOSIS — Z5111 Encounter for antineoplastic chemotherapy: Secondary | ICD-10-CM | POA: Diagnosis not present

## 2021-10-30 DIAGNOSIS — Z5189 Encounter for other specified aftercare: Secondary | ICD-10-CM | POA: Diagnosis not present

## 2021-10-30 MED ORDER — SODIUM CHLORIDE 0.9 % IV SOLN: Freq: Once | INTRAVENOUS | Status: AC

## 2021-10-30 MED ORDER — SODIUM CHLORIDE 0.9 % IV SOLN
150.0000 mg | Freq: Once | INTRAVENOUS | Status: AC
  Administered 2021-10-30: 150 mg via INTRAVENOUS
  Filled 2021-10-30: qty 150

## 2021-10-30 MED ORDER — SODIUM CHLORIDE 0.9 % IV SOLN
10.0000 mg | Freq: Once | INTRAVENOUS | Status: AC
  Administered 2021-10-30: 10 mg via INTRAVENOUS
  Filled 2021-10-30: qty 10

## 2021-10-30 MED ORDER — SODIUM CHLORIDE 0.9 % IV SOLN
284.8000 mg | Freq: Once | INTRAVENOUS | Status: AC
  Administered 2021-10-30: 280 mg via INTRAVENOUS
  Filled 2021-10-30: qty 28

## 2021-10-30 MED ORDER — EPOETIN ALFA-EPBX 40000 UNIT/ML IJ SOLN
40000.0000 [IU] | Freq: Once | INTRAMUSCULAR | Status: AC
  Administered 2021-10-30: 40000 [IU] via SUBCUTANEOUS

## 2021-10-30 MED ORDER — PALONOSETRON HCL INJECTION 0.25 MG/5ML
0.2500 mg | Freq: Once | INTRAVENOUS | Status: AC
  Administered 2021-10-30: 0.25 mg via INTRAVENOUS

## 2021-10-30 MED ORDER — SODIUM CHLORIDE 0.9 % IV SOLN
200.0000 mg | Freq: Once | INTRAVENOUS | Status: AC
  Administered 2021-10-30: 200 mg via INTRAVENOUS
  Filled 2021-10-30: qty 8

## 2021-10-30 MED ORDER — PACLITAXEL PROTEIN-BOUND CHEMO INJECTION 100 MG
208.0000 mg/m2 | Freq: Once | INTRAVENOUS | Status: AC
  Administered 2021-10-30: 350 mg via INTRAVENOUS
  Filled 2021-10-30: qty 70

## 2021-10-30 MED ORDER — SODIUM CHLORIDE 0.9% FLUSH
10.0000 mL | INTRAVENOUS | Status: DC | PRN
  Administered 2021-10-30: 10 mL

## 2021-10-30 MED ORDER — HEPARIN SOD (PORK) LOCK FLUSH 100 UNIT/ML IV SOLN
500.0000 [IU] | Freq: Once | INTRAVENOUS | Status: AC | PRN
  Administered 2021-10-30: 500 [IU]

## 2021-10-30 NOTE — Patient Instructions (Addendum)
Nanoparticle Albumin-Bound Paclitaxel injection What is this medication? NANOPARTICLE ALBUMIN-BOUND PACLITAXEL (Na no PAHR ti kuhl  al BYOO muhn-bound  PAK li TAX el) is a chemotherapy drug. It targets fast dividing cells, like cancer cells, and causes these cells to die. This medicine is used to treat advanced breast cancer, lung cancer, and pancreatic cancer. This medicine may be used for other purposes; ask your health care provider or pharmacist if you have questions. COMMON BRAND NAME(S): Abraxane What should I tell my care team before I take this medication? They need to know if you have any of these conditions: kidney disease liver disease low blood counts, like low white cell, platelet, or red cell counts lung or breathing disease, like asthma tingling of the fingers or toes, or other nerve disorder an unusual or allergic reaction to paclitaxel, albumin, other chemotherapy, other medicines, foods, dyes, or preservatives pregnant or trying to get pregnant breast-feeding How should I use this medication? This drug is given as an infusion into a vein. It is administered in a hospital or clinic by a specially trained health care professional. Talk to your pediatrician regarding the use of this medicine in children. Special care may be needed. Overdosage: If you think you have taken too much of this medicine contact a poison control center or emergency room at once. NOTE: This medicine is only for you. Do not share this medicine with others. What if I miss a dose? It is important not to miss your dose. Call your doctor or health care professional if you are unable to keep an appointment. What may interact with this medication? This medicine may interact with the following medications: antiviral medicines for hepatitis, HIV or AIDS certain antibiotics like erythromycin and clarithromycin certain medicines for fungal infections like ketoconazole and itraconazole certain medicines for  seizures like carbamazepine, phenobarbital, phenytoin gemfibrozil nefazodone rifampin St. John's wort This list may not describe all possible interactions. Give your health care provider a list of all the medicines, herbs, non-prescription drugs, or dietary supplements you use. Also tell them if you smoke, drink alcohol, or use illegal drugs. Some items may interact with your medicine. What should I watch for while using this medication? Your condition will be monitored carefully while you are receiving this medicine. You will need important blood work done while you are taking this medicine. This medicine can cause serious allergic reactions. If you experience allergic reactions like skin rash, itching or hives, swelling of the face, lips, or tongue, tell your doctor or health care professional right away. In some cases, you may be given additional medicines to help with side effects. Follow all directions for their use. This drug may make you feel generally unwell. This is not uncommon, as chemotherapy can affect healthy cells as well as cancer cells. Report any side effects. Continue your course of treatment even though you feel ill unless your doctor tells you to stop. Call your doctor or health care professional for advice if you get a fever, chills or sore throat, or other symptoms of a cold or flu. Do not treat yourself. This drug decreases your body's ability to fight infections. Try to avoid being around people who are sick. This medicine may increase your risk to bruise or bleed. Call your doctor or health care professional if you notice any unusual bleeding. Be careful brushing and flossing your teeth or using a toothpick because you may get an infection or bleed more easily. If you have any dental work done, tell  your dentist you are receiving this medicine. Avoid taking products that contain aspirin, acetaminophen, ibuprofen, naproxen, or ketoprofen unless instructed by your doctor. These  medicines may hide a fever. Do not become pregnant while taking this medicine or for 6 months after stopping it. Women should inform their doctor if they wish to become pregnant or think they might be pregnant. Men should not father a child while taking this medicine or for 3 months after stopping it. There is a potential for serious side effects to an unborn child. Talk to your health care professional or pharmacist for more information. Do not breast-feed an infant while taking this medicine or for 2 weeks after stopping it. This medicine may interfere with the ability to get pregnant or to father a child. You should talk to your doctor or health care professional if you are concerned about your fertility. What side effects may I notice from receiving this medication? Side effects that you should report to your doctor or health care professional as soon as possible: allergic reactions like skin rash, itching or hives, swelling of the face, lips, or tongue breathing problems changes in vision fast, irregular heartbeat low blood pressure mouth sores pain, tingling, numbness in the hands or feet signs of decreased platelets or bleeding - bruising, pinpoint red spots on the skin, black, tarry stools, blood in the urine signs of decreased red blood cells - unusually weak or tired, feeling faint or lightheaded, falls signs of infection - fever or chills, cough, sore throat, pain or difficulty passing urine signs and symptoms of liver injury like dark yellow or brown urine; general ill feeling or flu-like symptoms; light-colored stools; loss of appetite; nausea; right upper belly pain; unusually weak or tired; yellowing of the eyes or skin swelling of the ankles, feet, hands unusually slow heartbeat Side effects that usually do not require medical attention (report to your doctor or health care professional if they continue or are bothersome): diarrhea hair loss loss of appetite nausea,  vomiting tiredness This list may not describe all possible side effects. Call your doctor for medical advice about side effects. You may report side effects to FDA at 1-800-FDA-1088. Where should I keep my medication? This drug is given in a hospital or clinic and will not be stored at home. NOTE: This sheet is a summary. It may not cover all possible information. If you have questions about this medicine, talk to your doctor, pharmacist, or health care provider.  2022 Elsevier/Gold Standard (2017-07-22 00:00:00) Pembrolizumab injection What is this medication? PEMBROLIZUMAB (pem broe liz ue mab) is a monoclonal antibody. It is used to treat certain types of cancer. This medicine may be used for other purposes; ask your health care provider or pharmacist if you have questions. COMMON BRAND NAME(S): Keytruda What should I tell my care team before I take this medication? They need to know if you have any of these conditions: autoimmune diseases like Crohn's disease, ulcerative colitis, or lupus have had or planning to have an allogeneic stem cell transplant (uses someone else's stem cells) history of organ transplant history of chest radiation nervous system problems like myasthenia gravis or Guillain-Barre syndrome an unusual or allergic reaction to pembrolizumab, other medicines, foods, dyes, or preservatives pregnant or trying to get pregnant breast-feeding How should I use this medication? This medicine is for infusion into a vein. It is given by a health care professional in a hospital or clinic setting. A special MedGuide will be given to you before each  treatment. Be sure to read this information carefully each time. Talk to your pediatrician regarding the use of this medicine in children. While this drug may be prescribed for children as young as 6 months for selected conditions, precautions do apply. Overdosage: If you think you have taken too much of this medicine contact a poison  control center or emergency room at once. NOTE: This medicine is only for you. Do not share this medicine with others. What if I miss a dose? It is important not to miss your dose. Call your doctor or health care professional if you are unable to keep an appointment. What may interact with this medication? Interactions have not been studied. This list may not describe all possible interactions. Give your health care provider a list of all the medicines, herbs, non-prescription drugs, or dietary supplements you use. Also tell them if you smoke, drink alcohol, or use illegal drugs. Some items may interact with your medicine. What should I watch for while using this medication? Your condition will be monitored carefully while you are receiving this medicine. You may need blood work done while you are taking this medicine. Do not become pregnant while taking this medicine or for 4 months after stopping it. Women should inform their doctor if they wish to become pregnant or think they might be pregnant. There is a potential for serious side effects to an unborn child. Talk to your health care professional or pharmacist for more information. Do not breast-feed an infant while taking this medicine or for 4 months after the last dose. What side effects may I notice from receiving this medication? Side effects that you should report to your doctor or health care professional as soon as possible: allergic reactions like skin rash, itching or hives, swelling of the face, lips, or tongue bloody or black, tarry breathing problems changes in vision chest pain chills confusion constipation cough diarrhea dizziness or feeling faint or lightheaded fast or irregular heartbeat fever flushing joint pain low blood counts - this medicine may decrease the number of white blood cells, red blood cells and platelets. You may be at increased risk for infections and bleeding. muscle pain muscle weakness pain,  tingling, numbness in the hands or feet persistent headache redness, blistering, peeling or loosening of the skin, including inside the mouth signs and symptoms of high blood sugar such as dizziness; dry mouth; dry skin; fruity breath; nausea; stomach pain; increased hunger or thirst; increased urination signs and symptoms of kidney injury like trouble passing urine or change in the amount of urine signs and symptoms of liver injury like dark urine, light-colored stools, loss of appetite, nausea, right upper belly pain, yellowing of the eyes or skin sweating swollen lymph nodes weight loss Side effects that usually do not require medical attention (report to your doctor or health care professional if they continue or are bothersome): decreased appetite hair loss tiredness This list may not describe all possible side effects. Call your doctor for medical advice about side effects. You may report side effects to FDA at 1-800-FDA-1088. Where should I keep my medication? This drug is given in a hospital or clinic and will not be stored at home. NOTE: This sheet is a summary. It may not cover all possible information. If you have questions about this medicine, talk to your doctor, pharmacist, or health care provider.  2022 Elsevier/Gold Standard (2021-08-07 00:00:00) Carboplatin injection What is this medication? CARBOPLATIN (KAR boe pla tin) is a chemotherapy drug. It targets fast  dividing cells, like cancer cells, and causes these cells to die. This medicine is used to treat ovarian cancer and many other cancers. This medicine may be used for other purposes; ask your health care provider or pharmacist if you have questions. COMMON BRAND NAME(S): Paraplatin What should I tell my care team before I take this medication? They need to know if you have any of these conditions: blood disorders hearing problems kidney disease recent or ongoing radiation therapy an unusual or allergic reaction to  carboplatin, cisplatin, other chemotherapy, other medicines, foods, dyes, or preservatives pregnant or trying to get pregnant breast-feeding How should I use this medication? This drug is usually given as an infusion into a vein. It is administered in a hospital or clinic by a specially trained health care professional. Talk to your pediatrician regarding the use of this medicine in children. Special care may be needed. Overdosage: If you think you have taken too much of this medicine contact a poison control center or emergency room at once. NOTE: This medicine is only for you. Do not share this medicine with others. What if I miss a dose? It is important not to miss a dose. Call your doctor or health care professional if you are unable to keep an appointment. What may interact with this medication? medicines for seizures medicines to increase blood counts like filgrastim, pegfilgrastim, sargramostim some antibiotics like amikacin, gentamicin, neomycin, streptomycin, tobramycin vaccines Talk to your doctor or health care professional before taking any of these medicines: acetaminophen aspirin ibuprofen ketoprofen naproxen This list may not describe all possible interactions. Give your health care provider a list of all the medicines, herbs, non-prescription drugs, or dietary supplements you use. Also tell them if you smoke, drink alcohol, or use illegal drugs. Some items may interact with your medicine. What should I watch for while using this medication? Your condition will be monitored carefully while you are receiving this medicine. You will need important blood work done while you are taking this medicine. This drug may make you feel generally unwell. This is not uncommon, as chemotherapy can affect healthy cells as well as cancer cells. Report any side effects. Continue your course of treatment even though you feel ill unless your doctor tells you to stop. In some cases, you may be  given additional medicines to help with side effects. Follow all directions for their use. Call your doctor or health care professional for advice if you get a fever, chills or sore throat, or other symptoms of a cold or flu. Do not treat yourself. This drug decreases your body's ability to fight infections. Try to avoid being around people who are sick. This medicine may increase your risk to bruise or bleed. Call your doctor or health care professional if you notice any unusual bleeding. Be careful brushing and flossing your teeth or using a toothpick because you may get an infection or bleed more easily. If you have any dental work done, tell your dentist you are receiving this medicine. Avoid taking products that contain aspirin, acetaminophen, ibuprofen, naproxen, or ketoprofen unless instructed by your doctor. These medicines may hide a fever. Do not become pregnant while taking this medicine. Women should inform their doctor if they wish to become pregnant or think they might be pregnant. There is a potential for serious side effects to an unborn child. Talk to your health care professional or pharmacist for more information. Do not breast-feed an infant while taking this medicine. What side effects may I  notice from receiving this medication? Side effects that you should report to your doctor or health care professional as soon as possible: allergic reactions like skin rash, itching or hives, swelling of the face, lips, or tongue signs of infection - fever or chills, cough, sore throat, pain or difficulty passing urine signs of decreased platelets or bleeding - bruising, pinpoint red spots on the skin, black, tarry stools, nosebleeds signs of decreased red blood cells - unusually weak or tired, fainting spells, lightheadedness breathing problems changes in hearing changes in vision chest pain high blood pressure low blood counts - This drug may decrease the number of white blood cells, red  blood cells and platelets. You may be at increased risk for infections and bleeding. nausea and vomiting pain, swelling, redness or irritation at the injection site pain, tingling, numbness in the hands or feet problems with balance, talking, walking trouble passing urine or change in the amount of urine Side effects that usually do not require medical attention (report to your doctor or health care professional if they continue or are bothersome): hair loss loss of appetite metallic taste in the mouth or changes in taste This list may not describe all possible side effects. Call your doctor for medical advice about side effects. You may report side effects to FDA at 1-800-FDA-1088. Where should I keep my medication? This drug is given in a hospital or clinic and will not be stored at home. NOTE: This sheet is a summary. It may not cover all possible information. If you have questions about this medicine, talk to your doctor, pharmacist, or health care provider.  2022 Elsevier/Gold Standard (2008-04-27 00:00:00)

## 2021-10-31 LAB — T4: T4, Total: 6.4 ug/dL (ref 4.5–12.0)

## 2021-11-01 ENCOUNTER — Inpatient Hospital Stay: Payer: Medicare Other | Attending: Hematology and Oncology

## 2021-11-01 ENCOUNTER — Other Ambulatory Visit: Payer: Self-pay

## 2021-11-01 ENCOUNTER — Encounter: Payer: Self-pay | Admitting: Oncology

## 2021-11-01 VITALS — BP 118/67 | HR 88 | Temp 98.2°F | Resp 18 | Ht 69.0 in | Wt 134.0 lb

## 2021-11-01 DIAGNOSIS — Z79899 Other long term (current) drug therapy: Secondary | ICD-10-CM | POA: Insufficient documentation

## 2021-11-01 DIAGNOSIS — Z5112 Encounter for antineoplastic immunotherapy: Secondary | ICD-10-CM | POA: Insufficient documentation

## 2021-11-01 DIAGNOSIS — C7951 Secondary malignant neoplasm of bone: Secondary | ICD-10-CM | POA: Diagnosis not present

## 2021-11-01 DIAGNOSIS — R7303 Prediabetes: Secondary | ICD-10-CM | POA: Diagnosis not present

## 2021-11-01 DIAGNOSIS — Z5189 Encounter for other specified aftercare: Secondary | ICD-10-CM | POA: Diagnosis not present

## 2021-11-01 DIAGNOSIS — C3492 Malignant neoplasm of unspecified part of left bronchus or lung: Secondary | ICD-10-CM | POA: Insufficient documentation

## 2021-11-01 DIAGNOSIS — D6481 Anemia due to antineoplastic chemotherapy: Secondary | ICD-10-CM | POA: Diagnosis not present

## 2021-11-01 DIAGNOSIS — I129 Hypertensive chronic kidney disease with stage 1 through stage 4 chronic kidney disease, or unspecified chronic kidney disease: Secondary | ICD-10-CM | POA: Diagnosis not present

## 2021-11-01 DIAGNOSIS — Z5111 Encounter for antineoplastic chemotherapy: Secondary | ICD-10-CM | POA: Diagnosis not present

## 2021-11-01 DIAGNOSIS — M109 Gout, unspecified: Secondary | ICD-10-CM | POA: Diagnosis not present

## 2021-11-01 MED ORDER — PEGFILGRASTIM-CBQV 6 MG/0.6ML ~~LOC~~ SOSY
6.0000 mg | PREFILLED_SYRINGE | Freq: Once | SUBCUTANEOUS | Status: AC
  Administered 2021-11-01: 6 mg via SUBCUTANEOUS
  Filled 2021-11-01: qty 0.6

## 2021-11-01 NOTE — Patient Instructions (Signed)

## 2021-11-03 ENCOUNTER — Other Ambulatory Visit: Payer: Self-pay | Admitting: Cardiology

## 2021-11-03 DIAGNOSIS — I5022 Chronic systolic (congestive) heart failure: Secondary | ICD-10-CM

## 2021-11-05 ENCOUNTER — Encounter: Payer: Self-pay | Admitting: Oncology

## 2021-11-09 ENCOUNTER — Other Ambulatory Visit: Payer: Self-pay

## 2021-11-09 DIAGNOSIS — C7951 Secondary malignant neoplasm of bone: Secondary | ICD-10-CM

## 2021-11-09 DIAGNOSIS — M25551 Pain in right hip: Secondary | ICD-10-CM

## 2021-11-09 DIAGNOSIS — C3492 Malignant neoplasm of unspecified part of left bronchus or lung: Secondary | ICD-10-CM

## 2021-11-09 MED ORDER — OXYCODONE HCL 10 MG PO TABS: 10.0000 mg | ORAL_TABLET | ORAL | 0 refills | Status: DC | PRN

## 2021-11-09 NOTE — Telephone Encounter (Signed)
Req sent to [facility] P,NP.

## 2021-11-09 NOTE — Progress Notes (Signed)
[facility]  [address] [place],  [place]  [address] [phone]  Clinic Day:  11/16/2021  Referring physician: Maryella Shivers, MD  This document serves as a record of services personally performed by [name], MD. It was created on their behalf by [facility] E, a trained medical scribe. The creation of this record is based on the scribe's personal observations and the provider's statements to them.  HISTORY OF PRESENT ILLNESS:  The patient is a 87 y.o. male  stage IVA (T3 N0 M1b) squamous cell lung cancer.   He comes in today prior to his 4th cycle of chemotherapy, which will now consists of carboplatin/nab-paclitaxel/pembrolizumab.  Overall, the patient has been doing well.  He has already undergone palliative radiation to his right acetabular region.  He still has pain in his right hip region, but it is manageable with his narcotic pain medication.  He denies having other symptoms/findings which concern him for overt signs of disease progression.    PHYSICAL EXAM: Blood pressure 135/78, pulse 83, temperature 97.8 F (36.6 C), resp. rate 16, height 5\' 9"  (1.753 m), weight 132 lb 9.6 oz (60.1 kg), SpO2 94 %. Wt Readings from Last 3 Encounters:  11/16/21 132 lb 9.6 oz (60.1 kg)  11/01/21 134 lb (60.8 kg)  10/30/21 134 lb 0.6 oz (60.8 kg)   Body mass index is 19.58 kg/m. Performance status (ECOG): 2 - Symptomatic, <50% confined to bed Physical Exam Constitutional:      General: He is not in acute distress.    Appearance: Normal appearance. He is normal weight.     Comments: Thin gentleman in a wheelchair  HENT:     Head: Normocephalic and atraumatic.  Eyes:     General: No scleral icterus.    Extraocular Movements: Extraocular movements intact.     Conjunctiva/sclera: Conjunctivae normal.     Pupils: Pupils are equal, round, and reactive to light.  Cardiovascular:     Rate and Rhythm: Normal rate and regular rhythm.     Pulses:  Normal pulses.     Heart sounds: Normal heart sounds. No murmur heard.   No friction rub. No gallop.  Pulmonary:     Effort: Pulmonary effort is normal. No respiratory distress.     Breath sounds: Normal breath sounds.  Abdominal:     General: Bowel sounds are normal. There is no distension.     Palpations: Abdomen is soft. There is no hepatomegaly, splenomegaly or mass.     Tenderness: There is no abdominal tenderness.  Musculoskeletal:        General: Normal range of motion.     Cervical back: Normal range of motion and neck supple.     Right lower leg: No edema.     Left lower leg: No edema.  Lymphadenopathy:     Cervical: No cervical adenopathy.  Skin:    General: Skin is warm and dry.  Neurological:     General: No focal deficit present.     Mental Status: He is alert and oriented to person, place, and time. Mental status is at baseline.  Psychiatric:        Mood and Affect: Mood normal.        Behavior: Behavior normal.        Thought Content: Thought content normal.        Judgment: Judgment normal.    LABS:    Latest Reference Range & Units 11/16/21 00:00  Sodium 137 - 147  141 (E)  Potassium 3.4 - 5.3  5.2 (E)  Chloride 99 - 108  111 ! (E)  CO2 13 - 22  26 ! (E)  Glucose  104 (E)  BUN 4 - 21  11 (E)  Creatinine 0.6 - 1.3  1.0 (E)  Calcium 8.7 - 10.7  8.3 ! (E)  Alkaline Phosphatase 25 - 125  175 ! (E)  Albumin 3.5 - 5.0  3.2 ! (E)  AST 14 - 40  17 (E)  ALT 10 - 40  11 (E)  Bilirubin, Total  0.4 (E)  WBC  38.5 (E)  RBC 3.87 - 5.11  2.86 ! (E)  Hemoglobin 13.5 - 17.5  7.4 ! (E)  HCT 41 - 53  25 ! (E)  Platelets 150 - 399  156 (E)  !: Data is abnormal (E): External lab result  ASSESSMENT & PLAN:  An 87 y.o. male with stage IVA (T3 N0 M1b) squamous cell lung cancer, which includes spread of disease to his right hip.  He will proceed with his 4th cycle of chemotherapy next week, which consists of carboplatin/nab-paclitaxel/pembrolizumab.  His hemoglobin  remains low.  I will continue to give him Retacrit 40,000 units with each cycle of treatment for his chemotherapy-induced anemia.  He will continue taking Zometa for his metastatic bone disease.  I will see him back in 3 weeks for repeat clinical assessment.  CT scans will be done a day before his next visit to ascertain his new disease baseline.  If his disease burden is improved, he will be switched to maintenance pembrolizumab only.  The patient understands all the plans discussed today and is in agreement with them.   I, Rita Ohara, am acting as scribe for [name], MD    I have reviewed this report as typed by the medical scribe, and it is complete and accurate.  Brennyn Ortlieb Macarthur Critchley, MD

## 2021-11-16 ENCOUNTER — Inpatient Hospital Stay (INDEPENDENT_AMBULATORY_CARE_PROVIDER_SITE_OTHER): Payer: Medicare Other | Admitting: Oncology

## 2021-11-16 ENCOUNTER — Other Ambulatory Visit: Payer: Self-pay | Admitting: Oncology

## 2021-11-16 ENCOUNTER — Inpatient Hospital Stay: Payer: Medicare Other

## 2021-11-16 ENCOUNTER — Telehealth: Payer: Self-pay | Admitting: Oncology

## 2021-11-16 ENCOUNTER — Other Ambulatory Visit: Payer: Self-pay | Admitting: Hematology and Oncology

## 2021-11-16 VITALS — BP 135/78 | HR 83 | Temp 97.8°F | Resp 16 | Ht 69.0 in | Wt 132.6 lb

## 2021-11-16 DIAGNOSIS — Z5189 Encounter for other specified aftercare: Secondary | ICD-10-CM | POA: Diagnosis not present

## 2021-11-16 DIAGNOSIS — Z5111 Encounter for antineoplastic chemotherapy: Secondary | ICD-10-CM | POA: Diagnosis not present

## 2021-11-16 DIAGNOSIS — C3492 Malignant neoplasm of unspecified part of left bronchus or lung: Secondary | ICD-10-CM | POA: Diagnosis not present

## 2021-11-16 DIAGNOSIS — C7951 Secondary malignant neoplasm of bone: Secondary | ICD-10-CM | POA: Diagnosis not present

## 2021-11-16 DIAGNOSIS — D6481 Anemia due to antineoplastic chemotherapy: Secondary | ICD-10-CM | POA: Diagnosis not present

## 2021-11-16 DIAGNOSIS — Z5112 Encounter for antineoplastic immunotherapy: Secondary | ICD-10-CM | POA: Diagnosis not present

## 2021-11-16 LAB — TSH: TSH: 3.652 u[IU]/mL (ref 0.350–4.500)

## 2021-11-16 LAB — CBC AND DIFFERENTIAL
HCT: 25 — AB (ref 41–53)
Hemoglobin: 7.4 — AB (ref 13.5–17.5)
Neutrophils Absolute: 35.04
Platelets: 156 (ref 150–399)
WBC: 38.5

## 2021-11-16 LAB — BASIC METABOLIC PANEL
BUN: 11 (ref 4–21)
CO2: 26 — AB (ref 13–22)
Chloride: 111 — AB (ref 99–108)
Creatinine: 1 (ref 0.6–1.3)
Glucose: 104
Potassium: 5.2 (ref 3.4–5.3)
Sodium: 141 (ref 137–147)

## 2021-11-16 LAB — HEPATIC FUNCTION PANEL
ALT: 11 (ref 10–40)
AST: 17 (ref 14–40)
Alkaline Phosphatase: 175 — AB (ref 25–125)
Bilirubin, Total: 0.4

## 2021-11-16 LAB — CBC: RBC: 2.86 — AB (ref 3.87–5.11)

## 2021-11-16 LAB — COMPREHENSIVE METABOLIC PANEL
Albumin: 3.2 — AB (ref 3.5–5.0)
Calcium: 8.3 — AB (ref 8.7–10.7)

## 2021-11-16 NOTE — Telephone Encounter (Signed)
Per 12/16 los next appt scheduled and given to patient

## 2021-11-17 ENCOUNTER — Other Ambulatory Visit: Payer: Self-pay

## 2021-11-18 ENCOUNTER — Encounter: Payer: Self-pay | Admitting: Oncology

## 2021-11-18 LAB — T4: T4, Total: 7.5 ug/dL (ref 4.5–12.0)

## 2021-11-19 ENCOUNTER — Encounter: Payer: Self-pay | Admitting: Oncology

## 2021-11-19 NOTE — Progress Notes (Signed)
Proceed with chemo despite Hemoglobin = 7.4.  Pt will receive retacrit injections to help with this.  Hold pegfilgrastim with cycle 4 due to elevated white count per Dr. Bobby Rumpf.  Also hold zometa on 12/20 due to low calcium.

## 2021-11-20 ENCOUNTER — Other Ambulatory Visit: Payer: Self-pay

## 2021-11-20 ENCOUNTER — Inpatient Hospital Stay: Payer: Medicare Other

## 2021-11-20 VITALS — BP 128/71 | HR 87 | Temp 97.6°F | Resp 18 | Ht 69.0 in | Wt 135.0 lb

## 2021-11-20 DIAGNOSIS — C3492 Malignant neoplasm of unspecified part of left bronchus or lung: Secondary | ICD-10-CM | POA: Diagnosis not present

## 2021-11-20 DIAGNOSIS — Z5112 Encounter for antineoplastic immunotherapy: Secondary | ICD-10-CM | POA: Diagnosis not present

## 2021-11-20 DIAGNOSIS — Z5189 Encounter for other specified aftercare: Secondary | ICD-10-CM | POA: Diagnosis not present

## 2021-11-20 DIAGNOSIS — Z5111 Encounter for antineoplastic chemotherapy: Secondary | ICD-10-CM | POA: Diagnosis not present

## 2021-11-20 DIAGNOSIS — D6481 Anemia due to antineoplastic chemotherapy: Secondary | ICD-10-CM | POA: Diagnosis not present

## 2021-11-20 DIAGNOSIS — C7951 Secondary malignant neoplasm of bone: Secondary | ICD-10-CM | POA: Diagnosis not present

## 2021-11-20 MED ORDER — EPOETIN ALFA-EPBX 40000 UNIT/ML IJ SOLN
40000.0000 [IU] | Freq: Once | INTRAMUSCULAR | Status: AC
  Administered 2021-11-20: 40000 [IU] via SUBCUTANEOUS
  Filled 2021-11-20: qty 1

## 2021-11-20 MED ORDER — SODIUM CHLORIDE 0.9 % IV SOLN
200.0000 mg | Freq: Once | INTRAVENOUS | Status: AC
  Administered 2021-11-20: 200 mg via INTRAVENOUS
  Filled 2021-11-20: qty 8

## 2021-11-20 MED ORDER — SODIUM CHLORIDE 0.9 % IV SOLN
150.0000 mg | Freq: Once | INTRAVENOUS | Status: AC
  Administered 2021-11-20: 150 mg via INTRAVENOUS
  Filled 2021-11-20: qty 150

## 2021-11-20 MED ORDER — SODIUM CHLORIDE 0.9 % IV SOLN
10.0000 mg | Freq: Once | INTRAVENOUS | Status: AC
  Administered 2021-11-20: 10 mg via INTRAVENOUS
  Filled 2021-11-20: qty 10

## 2021-11-20 MED ORDER — HEPARIN SOD (PORK) LOCK FLUSH 100 UNIT/ML IV SOLN
500.0000 [IU] | Freq: Once | INTRAVENOUS | Status: AC | PRN
  Administered 2021-11-20: 500 [IU]

## 2021-11-20 MED ORDER — PACLITAXEL PROTEIN-BOUND CHEMO INJECTION 100 MG
208.0000 mg/m2 | Freq: Once | INTRAVENOUS | Status: AC
  Administered 2021-11-20: 350 mg via INTRAVENOUS
  Filled 2021-11-20: qty 70

## 2021-11-20 MED ORDER — SODIUM CHLORIDE 0.9 % IV SOLN
284.8000 mg | Freq: Once | INTRAVENOUS | Status: AC
  Administered 2021-11-20: 280 mg via INTRAVENOUS
  Filled 2021-11-20: qty 28

## 2021-11-20 MED ORDER — PALONOSETRON HCL INJECTION 0.25 MG/5ML
0.2500 mg | Freq: Once | INTRAVENOUS | Status: AC
  Administered 2021-11-20: 0.25 mg via INTRAVENOUS
  Filled 2021-11-20: qty 5

## 2021-11-20 MED ORDER — SODIUM CHLORIDE 0.9 % IV SOLN: Freq: Once | INTRAVENOUS | Status: AC

## 2021-11-20 MED ORDER — SODIUM CHLORIDE 0.9% FLUSH
10.0000 mL | INTRAVENOUS | Status: DC | PRN
  Administered 2021-11-20: 10 mL

## 2021-11-20 NOTE — Patient Instructions (Signed)
Epoetin Alfa injection What is this medication? EPOETIN ALFA (e POE e tin AL fa) helps your body make more red blood cells. This medicine is used to treat anemia caused by chronic kidney disease, cancer chemotherapy, or HIV-therapy. It may also be used before surgery if you have anemia. This medicine may be used for other purposes; ask your health care provider or pharmacist if you have questions. COMMON BRAND NAME(S): Epogen, Procrit, Retacrit What should I tell my care team before I take this medication? They need to know if you have any of these conditions: cancer heart disease high blood pressure history of blood clots history of stroke low levels of folate, iron, or vitamin B12 in the blood seizures an unusual or allergic reaction to erythropoietin, albumin, benzyl alcohol, hamster proteins, other medicines, foods, dyes, or preservatives pregnant or trying to get pregnant breast-feeding How should I use this medication? This medicine is for injection into a vein or under the skin. It is usually given by a health care professional in a hospital or clinic setting. If you get this medicine at home, you will be taught how to prepare and give this medicine. Use exactly as directed. Take your medicine at regular intervals. Do not take your medicine more often than directed. It is important that you put your used needles and syringes in a special sharps container. Do not put them in a trash can. If you do not have a sharps container, call your pharmacist or healthcare provider to get one. A special MedGuide will be given to you by the pharmacist with each prescription and refill. Be sure to read this information carefully each time. Talk to your pediatrician regarding the use of this medicine in children. While this drug may be prescribed for selected conditions, precautions do apply. Overdosage: If you think you have taken too much of this medicine contact a poison control center or emergency  room at once. NOTE: This medicine is only for you. Do not share this medicine with others. What if I miss a dose? If you miss a dose, take it as soon as you can. If it is almost time for your next dose, take only that dose. Do not take double or extra doses. What may interact with this medication? Interactions have not been studied. This list may not describe all possible interactions. Give your health care provider a list of all the medicines, herbs, non-prescription drugs, or dietary supplements you use. Also tell them if you smoke, drink alcohol, or use illegal drugs. Some items may interact with your medicine. What should I watch for while using this medication? Your condition will be monitored carefully while you are receiving this medicine. You may need blood work done while you are taking this medicine. This medicine may cause a decrease in vitamin B6. You should make sure that you get enough vitamin B6 while you are taking this medicine. Discuss the foods you eat and the vitamins you take with your health care professional. What side effects may I notice from receiving this medication? Side effects that you should report to your doctor or health care professional as soon as possible: allergic reactions like skin rash, itching or hives, swelling of the face, lips, or tongue seizures signs and symptoms of a blood clot such as breathing problems; changes in vision; chest pain; severe, sudden headache; pain, swelling, warmth in the leg; trouble speaking; sudden numbness or weakness of the face, arm or leg signs and symptoms of a stroke like  changes in vision; confusion; trouble speaking or understanding; severe headaches; sudden numbness or weakness of the face, arm or leg; trouble walking; dizziness; loss of balance or coordination Side effects that usually do not require medical attention (report to your doctor or health care professional if they continue or are  bothersome): chills cough dizziness fever headaches joint pain muscle cramps muscle pain nausea, vomiting pain, redness, or irritation at site where injected This list may not describe all possible side effects. Call your doctor for medical advice about side effects. You may report side effects to FDA at 1-800-FDA-1088. Where should I keep my medication? Keep out of the reach of children. Store in a refrigerator between 2 and 8 degrees C (36 and 46 degrees F). Do not freeze or shake. Throw away any unused portion if using a single-dose vial. Multi-dose vials can be kept in the refrigerator for up to 21 days after the initial dose. Throw away unused medicine. NOTE: This sheet is a summary. It may not cover all possible information. If you have questions about this medicine, talk to your doctor, pharmacist, or health care provider.  2022 [employer] Standard (2017-07-22 00:00:00) Nanoparticle Albumin-Bound Paclitaxel injection What is this medication? NANOPARTICLE ALBUMIN-BOUND PACLITAXEL (Na no PAHR ti kuhl  al BYOO muhn-bound  PAK li TAX el) is a chemotherapy drug. It targets fast dividing cells, like cancer cells, and causes these cells to die. This medicine is used to treat advanced breast cancer, lung cancer, and pancreatic cancer. This medicine may be used for other purposes; ask your health care provider or pharmacist if you have questions. COMMON BRAND NAME(S): Abraxane What should I tell my care team before I take this medication? They need to know if you have any of these conditions: kidney disease liver disease low blood counts, like low white cell, platelet, or red cell counts lung or breathing disease, like asthma tingling of the fingers or toes, or other nerve disorder an unusual or allergic reaction to paclitaxel, albumin, other chemotherapy, other medicines, foods, dyes, or preservatives pregnant or trying to get pregnant breast-feeding How should I use this  medication? This drug is given as an infusion into a vein. It is administered in a hospital or clinic by a specially trained health care professional. Talk to your pediatrician regarding the use of this medicine in children. Special care may be needed. Overdosage: If you think you have taken too much of this medicine contact a poison control center or emergency room at once. NOTE: This medicine is only for you. Do not share this medicine with others. What if I miss a dose? It is important not to miss your dose. Call your doctor or health care professional if you are unable to keep an appointment. What may interact with this medication? This medicine may interact with the following medications: antiviral medicines for hepatitis, HIV or AIDS certain antibiotics like erythromycin and clarithromycin certain medicines for fungal infections like ketoconazole and itraconazole certain medicines for seizures like carbamazepine, phenobarbital, phenytoin gemfibrozil nefazodone rifampin St. John's wort This list may not describe all possible interactions. Give your health care provider a list of all the medicines, herbs, non-prescription drugs, or dietary supplements you use. Also tell them if you smoke, drink alcohol, or use illegal drugs. Some items may interact with your medicine. What should I watch for while using this medication? Your condition will be monitored carefully while you are receiving this medicine. You will need important blood work done while you are taking this  medicine. This medicine can cause serious allergic reactions. If you experience allergic reactions like skin rash, itching or hives, swelling of the face, lips, or tongue, tell your doctor or health care professional right away. In some cases, you may be given additional medicines to help with side effects. Follow all directions for their use. This drug may make you feel generally unwell. This is not uncommon, as chemotherapy can  affect healthy cells as well as cancer cells. Report any side effects. Continue your course of treatment even though you feel ill unless your doctor tells you to stop. Call your doctor or health care professional for advice if you get a fever, chills or sore throat, or other symptoms of a cold or flu. Do not treat yourself. This drug decreases your body's ability to fight infections. Try to avoid being around people who are sick. This medicine may increase your risk to bruise or bleed. Call your doctor or health care professional if you notice any unusual bleeding. Be careful brushing and flossing your teeth or using a toothpick because you may get an infection or bleed more easily. If you have any dental work done, tell your dentist you are receiving this medicine. Avoid taking products that contain aspirin, acetaminophen, ibuprofen, naproxen, or ketoprofen unless instructed by your doctor. These medicines may hide a fever. Do not become pregnant while taking this medicine or for 6 months after stopping it. Women should inform their doctor if they wish to become pregnant or think they might be pregnant. Men should not father a child while taking this medicine or for 3 months after stopping it. There is a potential for serious side effects to an unborn child. Talk to your health care professional or pharmacist for more information. Do not breast-feed an infant while taking this medicine or for 2 weeks after stopping it. This medicine may interfere with the ability to get pregnant or to father a child. You should talk to your doctor or health care professional if you are concerned about your fertility. What side effects may I notice from receiving this medication? Side effects that you should report to your doctor or health care professional as soon as possible: allergic reactions like skin rash, itching or hives, swelling of the face, lips, or tongue breathing problems changes in vision fast, irregular  heartbeat low blood pressure mouth sores pain, tingling, numbness in the hands or feet signs of decreased platelets or bleeding - bruising, pinpoint red spots on the skin, black, tarry stools, blood in the urine signs of decreased red blood cells - unusually weak or tired, feeling faint or lightheaded, falls signs of infection - fever or chills, cough, sore throat, pain or difficulty passing urine signs and symptoms of liver injury like dark yellow or brown urine; general ill feeling or flu-like symptoms; light-colored stools; loss of appetite; nausea; right upper belly pain; unusually weak or tired; yellowing of the eyes or skin swelling of the ankles, feet, hands unusually slow heartbeat Side effects that usually do not require medical attention (report to your doctor or health care professional if they continue or are bothersome): diarrhea hair loss loss of appetite nausea, vomiting tiredness This list may not describe all possible side effects. Call your doctor for medical advice about side effects. You may report side effects to FDA at 1-800-FDA-1088. Where should I keep my medication? This drug is given in a hospital or clinic and will not be stored at home. NOTE: This sheet is a summary. It  may not cover all possible information. If you have questions about this medicine, talk to your doctor, pharmacist, or health care provider.  2022 [employer] Standard (2017-07-22 00:00:00) Carboplatin injection What is this medication? CARBOPLATIN (KAR boe pla tin) is a chemotherapy drug. It targets fast dividing cells, like cancer cells, and causes these cells to die. This medicine is used to treat ovarian cancer and many other cancers. This medicine may be used for other purposes; ask your health care provider or pharmacist if you have questions. COMMON BRAND NAME(S): Paraplatin What should I tell my care team before I take this medication? They need to know if you have any of these  conditions: blood disorders hearing problems kidney disease recent or ongoing radiation therapy an unusual or allergic reaction to carboplatin, cisplatin, other chemotherapy, other medicines, foods, dyes, or preservatives pregnant or trying to get pregnant breast-feeding How should I use this medication? This drug is usually given as an infusion into a vein. It is administered in a hospital or clinic by a specially trained health care professional. Talk to your pediatrician regarding the use of this medicine in children. Special care may be needed. Overdosage: If you think you have taken too much of this medicine contact a poison control center or emergency room at once. NOTE: This medicine is only for you. Do not share this medicine with others. What if I miss a dose? It is important not to miss a dose. Call your doctor or health care professional if you are unable to keep an appointment. What may interact with this medication? medicines for seizures medicines to increase blood counts like filgrastim, pegfilgrastim, sargramostim some antibiotics like amikacin, gentamicin, neomycin, streptomycin, tobramycin vaccines Talk to your doctor or health care professional before taking any of these medicines: acetaminophen aspirin ibuprofen ketoprofen naproxen This list may not describe all possible interactions. Give your health care provider a list of all the medicines, herbs, non-prescription drugs, or dietary supplements you use. Also tell them if you smoke, drink alcohol, or use illegal drugs. Some items may interact with your medicine. What should I watch for while using this medication? Your condition will be monitored carefully while you are receiving this medicine. You will need important blood work done while you are taking this medicine. This drug may make you feel generally unwell. This is not uncommon, as chemotherapy can affect healthy cells as well as cancer cells. Report any side  effects. Continue your course of treatment even though you feel ill unless your doctor tells you to stop. In some cases, you may be given additional medicines to help with side effects. Follow all directions for their use. Call your doctor or health care professional for advice if you get a fever, chills or sore throat, or other symptoms of a cold or flu. Do not treat yourself. This drug decreases your body's ability to fight infections. Try to avoid being around people who are sick. This medicine may increase your risk to bruise or bleed. Call your doctor or health care professional if you notice any unusual bleeding. Be careful brushing and flossing your teeth or using a toothpick because you may get an infection or bleed more easily. If you have any dental work done, tell your dentist you are receiving this medicine. Avoid taking products that contain aspirin, acetaminophen, ibuprofen, naproxen, or ketoprofen unless instructed by your doctor. These medicines may hide a fever. Do not become pregnant while taking this medicine. Women should inform their doctor if they wish  to become pregnant or think they might be pregnant. There is a potential for serious side effects to an unborn child. Talk to your health care professional or pharmacist for more information. Do not breast-feed an infant while taking this medicine. What side effects may I notice from receiving this medication? Side effects that you should report to your doctor or health care professional as soon as possible: allergic reactions like skin rash, itching or hives, swelling of the face, lips, or tongue signs of infection - fever or chills, cough, sore throat, pain or difficulty passing urine signs of decreased platelets or bleeding - bruising, pinpoint red spots on the skin, black, tarry stools, nosebleeds signs of decreased red blood cells - unusually weak or tired, fainting spells, lightheadedness breathing problems changes in  hearing changes in vision chest pain high blood pressure low blood counts - This drug may decrease the number of white blood cells, red blood cells and platelets. You may be at increased risk for infections and bleeding. nausea and vomiting pain, swelling, redness or irritation at the injection site pain, tingling, numbness in the hands or feet problems with balance, talking, walking trouble passing urine or change in the amount of urine Side effects that usually do not require medical attention (report to your doctor or health care professional if they continue or are bothersome): hair loss loss of appetite metallic taste in the mouth or changes in taste This list may not describe all possible side effects. Call your doctor for medical advice about side effects. You may report side effects to FDA at 1-800-FDA-1088. Where should I keep my medication? This drug is given in a hospital or clinic and will not be stored at home. NOTE: This sheet is a summary. It may not cover all possible information. If you have questions about this medicine, talk to your doctor, pharmacist, or health care provider.  2022 [employer] Standard (2008-04-27 00:00:00) Pembrolizumab injection What is this medication? PEMBROLIZUMAB (pem broe liz ue mab) is a monoclonal antibody. It is used to treat certain types of cancer. This medicine may be used for other purposes; ask your health care provider or pharmacist if you have questions. COMMON BRAND NAME(S): Keytruda What should I tell my care team before I take this medication? They need to know if you have any of these conditions: autoimmune diseases like Crohn's disease, ulcerative colitis, or lupus have had or planning to have an allogeneic stem cell transplant (uses someone else's stem cells) history of organ transplant history of chest radiation nervous system problems like myasthenia gravis or Guillain-Barre syndrome an unusual or allergic reaction to  pembrolizumab, other medicines, foods, dyes, or preservatives pregnant or trying to get pregnant breast-feeding How should I use this medication? This medicine is for infusion into a vein. It is given by a health care professional in a hospital or clinic setting. A special MedGuide will be given to you before each treatment. Be sure to read this information carefully each time. Talk to your pediatrician regarding the use of this medicine in children. While this drug may be prescribed for children as young as 6 months for selected conditions, precautions do apply. Overdosage: If you think you have taken too much of this medicine contact a poison control center or emergency room at once. NOTE: This medicine is only for you. Do not share this medicine with others. What if I miss a dose? It is important not to miss your dose. Call your doctor or health care professional if  you are unable to keep an appointment. What may interact with this medication? Interactions have not been studied. This list may not describe all possible interactions. Give your health care provider a list of all the medicines, herbs, non-prescription drugs, or dietary supplements you use. Also tell them if you smoke, drink alcohol, or use illegal drugs. Some items may interact with your medicine. What should I watch for while using this medication? Your condition will be monitored carefully while you are receiving this medicine. You may need blood work done while you are taking this medicine. Do not become pregnant while taking this medicine or for 4 months after stopping it. Women should inform their doctor if they wish to become pregnant or think they might be pregnant. There is a potential for serious side effects to an unborn child. Talk to your health care professional or pharmacist for more information. Do not breast-feed an infant while taking this medicine or for 4 months after the last dose. What side effects may I notice  from receiving this medication? Side effects that you should report to your doctor or health care professional as soon as possible: allergic reactions like skin rash, itching or hives, swelling of the face, lips, or tongue bloody or black, tarry breathing problems changes in vision chest pain chills confusion constipation cough diarrhea dizziness or feeling faint or lightheaded fast or irregular heartbeat fever flushing joint pain low blood counts - this medicine may decrease the number of white blood cells, red blood cells and platelets. You may be at increased risk for infections and bleeding. muscle pain muscle weakness pain, tingling, numbness in the hands or feet persistent headache redness, blistering, peeling or loosening of the skin, including inside the mouth signs and symptoms of high blood sugar such as dizziness; dry mouth; dry skin; fruity breath; nausea; stomach pain; increased hunger or thirst; increased urination signs and symptoms of kidney injury like trouble passing urine or change in the amount of urine signs and symptoms of liver injury like dark urine, light-colored stools, loss of appetite, nausea, right upper belly pain, yellowing of the eyes or skin sweating swollen lymph nodes weight loss Side effects that usually do not require medical attention (report to your doctor or health care professional if they continue or are bothersome): decreased appetite hair loss tiredness This list may not describe all possible side effects. Call your doctor for medical advice about side effects. You may report side effects to FDA at 1-800-FDA-1088. Where should I keep my medication? This drug is given in a hospital or clinic and will not be stored at home. NOTE: This sheet is a summary. It may not cover all possible information. If you have questions about this medicine, talk to your doctor, pharmacist, or health care provider.  2022 [employer] Standard (2021-08-07  00:00:00)

## 2021-11-22 ENCOUNTER — Ambulatory Visit: Payer: Medicare Other

## 2021-11-22 ENCOUNTER — Inpatient Hospital Stay: Payer: Medicare Other

## 2021-11-22 ENCOUNTER — Encounter: Payer: Self-pay | Admitting: Oncology

## 2021-11-30 NOTE — Progress Notes (Signed)
[facility]  [address] [place],  [place]  [address] [phone]  Clinic Day:  12/07/2021  Referring physician: Maryella Shivers, MD  This document serves as a record of services personally performed by [name], MD. It was created on their behalf by [facility] E, a trained medical scribe. The creation of this record is based on the scribe's personal observations and the provider's statements to them.  HISTORY OF PRESENT ILLNESS:  The patient is an 87 y.o. male  stage IVA (T3 N0 M1b) squamous cell lung cancer.   He comes in today to go over his CT scans to ascertain his new disease baseline after 4 cycles of chemotherapy, which consisted of carboplatin/nab-paclitaxel/pembrolizumab.  Overall, the patient has been doing well.  He has already undergone palliative radiation to his right acetabular region.  He still has pain in his right hip region, but it is manageable with his narcotic pain medication.  He denies having other symptoms/findings which concern him for overt signs of disease progression.    PHYSICAL EXAM: Blood pressure (!) 148/97, pulse 100, temperature 97.6 F (36.4 C), resp. rate 16, height 5\' 9"  (1.753 m), weight 136 lb 3.2 oz (61.8 kg), SpO2 96 %. Wt Readings from Last 3 Encounters:  12/07/21 136 lb 3.2 oz (61.8 kg)  11/20/21 135 lb (61.2 kg)  11/16/21 132 lb 9.6 oz (60.1 kg)   Body mass index is 20.11 kg/m. Performance status (ECOG): 2 - Symptomatic, <50% confined to bed Physical Exam Constitutional:      General: He is not in acute distress.    Appearance: Normal appearance. He is normal weight.     Comments: Thin gentleman in a wheelchair  HENT:     Head: Normocephalic and atraumatic.  Eyes:     General: No scleral icterus.    Extraocular Movements: Extraocular movements intact.     Conjunctiva/sclera: Conjunctivae normal.     Pupils: Pupils are equal, round, and reactive to light.  Cardiovascular:     Rate and  Rhythm: Normal rate and regular rhythm.     Pulses: Normal pulses.     Heart sounds: Normal heart sounds. No murmur heard.   No friction rub. No gallop.  Pulmonary:     Effort: Pulmonary effort is normal. No respiratory distress.     Breath sounds: Normal breath sounds.  Abdominal:     General: Bowel sounds are normal. There is no distension.     Palpations: Abdomen is soft. There is no hepatomegaly, splenomegaly or mass.     Tenderness: There is no abdominal tenderness.  Musculoskeletal:        General: Normal range of motion.     Cervical back: Normal range of motion and neck supple.     Right lower leg: No edema.     Left lower leg: No edema.  Lymphadenopathy:     Cervical: No cervical adenopathy.  Skin:    General: Skin is warm and dry.  Neurological:     General: No focal deficit present.     Mental Status: He is alert and oriented to person, place, and time. Mental status is at baseline.  Psychiatric:        Mood and Affect: Mood normal.        Behavior: Behavior normal.        Thought Content: Thought content normal.        Judgment: Judgment normal.   SCANS: Recent CT chest, abdomen and pelvis has  revealed the following:  FINDINGS:  CT CHEST FINDINGS  Cardiovascular: Right chest port catheter. Aortic atherosclerosis.  Cardiomegaly. No pericardial effusion.  Mediastinum/Nodes: No enlarged mediastinal, hilar, or axillary lymph  nodes. Thyroid gland, trachea, and esophagus demonstrate no  significant findings.  Lungs/Pleura: New moderate right, small left pleural effusions and  associated atelectasis or consolidation. Interval decrease in size  of a left hilar mass, now measuring no greater than 1.8 x 1.7 cm,  previously 3.4 x 2.8 cm (series 2, image 34). Interval decrease in  size of a left suprahilar mass, measuring 2.4 x 1.9 cm, previously  4.2 x 2.8 cm (series 4, image 58). New irregular and ground-glass  opacities of the anterior left upper lobe (series 4, image  69) and  superior segment left lower lobe (series 4, image 66). Mild  underlying centrilobular and paraseptal emphysema.  Musculoskeletal: No chest wall mass.   CT ABDOMEN PELVIS FINDINGS  Hepatobiliary: No solid liver abnormality is seen. No gallstones,  gallbladder wall thickening, or biliary dilatation.  Pancreas: Unchanged prominence of the main pancreatic duct to the  ampulla, measuring up to 0.6 cm in caliber, with diffuse atrophy of  the pancreatic parenchyma.  Spleen: Normal in size without significant abnormality.  Adrenals/Urinary Tract: Adrenal glands are unremarkable. Kidneys are  normal, without renal calculi, solid lesion, or hydronephrosis.  Bladder is unremarkable.  Stomach/Bowel: Stomach is within normal limits. Appendix appears  normal. No evidence of bowel wall thickening, distention, or  inflammatory changes.  Vascular/Lymphatic: Aortic atherosclerosis. No enlarged abdominal or  pelvic lymph nodes.  Reproductive: Status post prostatectomy.  Other: Status post left inguinal hernia repair. Anasarca. Small  volume free fluid in the low pelvis.  Musculoskeletal: Slight interval increase in size of a lytic lesion  of the right iliac crest with associated soft tissue mass and  peripheral calcification, measuring 5.9 x 4.9 cm, previously 5.1 x  4.5 cm when measured similarly (series 2, image 102).   IMPRESSION:  1. Interval decrease in size of a left hilar and suprahilar masses,  consistent with treatment response.  2. Slight interval increase in size of a lytic lesion of the right  iliac crest with associated soft tissue mass and peripheral  calcification. This most likely reflects worsening of osseous  metastatic disease (i.e. mixed response), however follow-up PET-CT  may be helpful to assess for actual extent of FDG avid residual soft  tissue in this vicinity.  3. No evidence of lymphadenopathy or soft tissue metastatic disease  in the abdomen or pelvis.  4.  New moderate right, small left pleural effusions and associated  atelectasis or consolidation.  5. New irregular and ground-glass opacities of the anterior left  upper lobe and superior segment left lower lobe, nonspecific and  infectious or inflammatory, differential considerations including  drug toxicity in the setting of chemotherapy.  6. No evidence of metastatic disease in the abdomen or pelvis.  7. Status post prostatectomy.  8. Small volume free fluid in the low pelvis. Anasarca.  9. Emphysema.   LABS:    Latest Reference Range & Units 11/16/21 00:00  Sodium 137 - 147  141 (E)  Potassium 3.4 - 5.3  5.2 (E)  Chloride 99 - 108  111 ! (E)  CO2 13 - 22  26 ! (E)  Glucose  104 (E)  BUN 4 - 21  11 (E)  Creatinine 0.6 - 1.3  1.0 (E)  Calcium 8.7 - 10.7  8.3 ! (E)  Alkaline Phosphatase 25 -  125  175 ! (E)  Albumin 3.5 - 5.0  3.2 ! (E)  AST 14 - 40  17 (E)  ALT 10 - 40  11 (E)  Bilirubin, Total  0.4 (E)  WBC  38.5 (E)  RBC 3.87 - 5.11  2.86 ! (E)  Hemoglobin 13.5 - 17.5  7.4 ! (E)  HCT 41 - 53  25 ! (E)  Platelets 150 - 399  156 (E)  !: Data is abnormal (E): External lab result  ASSESSMENT & PLAN:  An 87 y.o. male with stage IVA (T3 N0 M1b) squamous cell lung cancer, which includes spread of disease to his right hip.  In clinic, I reviewed the CT images with him, for which he could see that his primary lung mass and associated lymphadenopathy have significantly decreased in size.  Overall, his right acetabular lesion appears slightly larger in size.  However, this patient claims the pain in his right hip is not particularly worrisome.  Furthermore, he does not believe additional palliative radiation is necessary at this time.  His metastatic right acetabular lesion will continue to be followed over time.  I am pleased as there remain no new sites of disease metastasis.  As his primary disease has improved, he will proceed with maintenance pembrolizumab immunotherapy by itself.  As  he does have chemotherapy-induced anemia, I will continue to give him Retacrit 40,000 units with each cycle of immunotherapy.  As his chemotherapy is being stopped, I anticipate his hemoglobin will improve over time.  He will continue taking Zometa for his metastatic bone disease.  With respect to his bilateral pleural effusions, this gentleman does have a history of congestive heart failure.  His scans definitely showed that his right-sided pleural effusion is larger than his left side.  His primary disease is in his left lung.  The fact that he has a larger right pleural effusion has me to believe his effusions are not malignant in nature.  I will have him see his cardiologist in [place] to reassess his congestive heart failure.  Otherwise, I will see him back in 3 weeks before he heads into his second cycle of maintenance pembrolizumab immunotherapy.  The patient understands all the plans discussed today and is in agreement with them.   I, Rita Ohara, am acting as scribe for [name], MD    I have reviewed this report as typed by the medical scribe, and it is complete and accurate.  Dequincy Macarthur Critchley, MD

## 2021-12-02 DIAGNOSIS — J449 Chronic obstructive pulmonary disease, unspecified: Secondary | ICD-10-CM | POA: Diagnosis not present

## 2021-12-02 DIAGNOSIS — I1310 Hypertensive heart and chronic kidney disease without heart failure, with stage 1 through stage 4 chronic kidney disease, or unspecified chronic kidney disease: Secondary | ICD-10-CM | POA: Diagnosis not present

## 2021-12-02 DIAGNOSIS — C3490 Malignant neoplasm of unspecified part of unspecified bronchus or lung: Secondary | ICD-10-CM | POA: Diagnosis not present

## 2021-12-03 ENCOUNTER — Other Ambulatory Visit: Payer: Self-pay | Admitting: Cardiology

## 2021-12-03 DIAGNOSIS — I5022 Chronic systolic (congestive) heart failure: Secondary | ICD-10-CM

## 2021-12-04 ENCOUNTER — Other Ambulatory Visit: Payer: Self-pay | Admitting: Pharmacist

## 2021-12-06 ENCOUNTER — Other Ambulatory Visit: Payer: Self-pay | Admitting: Oncology

## 2021-12-06 DIAGNOSIS — K8689 Other specified diseases of pancreas: Secondary | ICD-10-CM | POA: Diagnosis not present

## 2021-12-06 DIAGNOSIS — C3492 Malignant neoplasm of unspecified part of left bronchus or lung: Secondary | ICD-10-CM | POA: Diagnosis not present

## 2021-12-06 DIAGNOSIS — I700 Atherosclerosis of aorta: Secondary | ICD-10-CM | POA: Diagnosis not present

## 2021-12-06 DIAGNOSIS — C3490 Malignant neoplasm of unspecified part of unspecified bronchus or lung: Secondary | ICD-10-CM | POA: Diagnosis not present

## 2021-12-06 DIAGNOSIS — J432 Centrilobular emphysema: Secondary | ICD-10-CM | POA: Diagnosis not present

## 2021-12-06 DIAGNOSIS — C7951 Secondary malignant neoplasm of bone: Secondary | ICD-10-CM | POA: Diagnosis not present

## 2021-12-07 ENCOUNTER — Inpatient Hospital Stay: Payer: Medicare Other | Attending: Oncology | Admitting: Oncology

## 2021-12-07 ENCOUNTER — Other Ambulatory Visit: Payer: Self-pay

## 2021-12-07 VITALS — BP 148/97 | HR 100 | Temp 97.6°F | Resp 16 | Ht 69.0 in | Wt 136.2 lb

## 2021-12-07 DIAGNOSIS — D6481 Anemia due to antineoplastic chemotherapy: Secondary | ICD-10-CM | POA: Insufficient documentation

## 2021-12-07 DIAGNOSIS — C3492 Malignant neoplasm of unspecified part of left bronchus or lung: Secondary | ICD-10-CM | POA: Diagnosis not present

## 2021-12-07 DIAGNOSIS — Z5112 Encounter for antineoplastic immunotherapy: Secondary | ICD-10-CM | POA: Insufficient documentation

## 2021-12-07 DIAGNOSIS — C7951 Secondary malignant neoplasm of bone: Secondary | ICD-10-CM | POA: Insufficient documentation

## 2021-12-07 DIAGNOSIS — Z79899 Other long term (current) drug therapy: Secondary | ICD-10-CM | POA: Insufficient documentation

## 2021-12-07 DIAGNOSIS — T451X5A Adverse effect of antineoplastic and immunosuppressive drugs, initial encounter: Secondary | ICD-10-CM

## 2021-12-07 DIAGNOSIS — G893 Neoplasm related pain (acute) (chronic): Secondary | ICD-10-CM | POA: Insufficient documentation

## 2021-12-07 DIAGNOSIS — J90 Pleural effusion, not elsewhere classified: Secondary | ICD-10-CM

## 2021-12-09 ENCOUNTER — Encounter: Payer: Self-pay | Admitting: Oncology

## 2021-12-09 DIAGNOSIS — T451X5A Adverse effect of antineoplastic and immunosuppressive drugs, initial encounter: Secondary | ICD-10-CM | POA: Insufficient documentation

## 2021-12-10 ENCOUNTER — Encounter: Payer: Self-pay | Admitting: Oncology

## 2021-12-10 MED FILL — Pembrolizumab IV Soln 100 MG/4ML (25 MG/ML): INTRAVENOUS | Qty: 8 | Status: AC

## 2021-12-11 ENCOUNTER — Other Ambulatory Visit: Payer: Self-pay

## 2021-12-11 ENCOUNTER — Inpatient Hospital Stay: Payer: Medicare Other

## 2021-12-11 VITALS — BP 138/85 | HR 99 | Temp 97.5°F | Resp 20 | Wt 132.8 lb

## 2021-12-11 DIAGNOSIS — Z79899 Other long term (current) drug therapy: Secondary | ICD-10-CM | POA: Diagnosis not present

## 2021-12-11 DIAGNOSIS — C3492 Malignant neoplasm of unspecified part of left bronchus or lung: Secondary | ICD-10-CM | POA: Diagnosis not present

## 2021-12-11 DIAGNOSIS — D6481 Anemia due to antineoplastic chemotherapy: Secondary | ICD-10-CM | POA: Diagnosis not present

## 2021-12-11 DIAGNOSIS — Z5112 Encounter for antineoplastic immunotherapy: Secondary | ICD-10-CM | POA: Diagnosis not present

## 2021-12-11 MED ORDER — HEPARIN SOD (PORK) LOCK FLUSH 100 UNIT/ML IV SOLN
500.0000 [IU] | Freq: Once | INTRAVENOUS | Status: AC | PRN
  Administered 2021-12-11: 500 [IU]

## 2021-12-11 MED ORDER — SODIUM CHLORIDE 0.9% FLUSH
10.0000 mL | INTRAVENOUS | Status: DC | PRN
  Administered 2021-12-11: 10 mL

## 2021-12-11 MED ORDER — SODIUM CHLORIDE 0.9 % IV SOLN: Freq: Once | INTRAVENOUS | Status: AC

## 2021-12-11 MED ORDER — SODIUM CHLORIDE 0.9 % IV SOLN
200.0000 mg | Freq: Once | INTRAVENOUS | Status: AC
  Administered 2021-12-11: 200 mg via INTRAVENOUS
  Filled 2021-12-11: qty 8

## 2021-12-11 MED ORDER — EPOETIN ALFA-EPBX 40000 UNIT/ML IJ SOLN
40000.0000 [IU] | Freq: Once | INTRAMUSCULAR | Status: AC
  Administered 2021-12-11: 40000 [IU] via SUBCUTANEOUS
  Filled 2021-12-11: qty 1

## 2021-12-11 NOTE — Patient Instructions (Signed)
[facility]  Discharge Instructions: Thank you for choosing [facility] to provide your oncology and hematology care.  If you have a lab appointment with the [facility], please go directly to the [facility] and check in at the registration area.   Wear comfortable clothing and clothing appropriate for easy access to any Portacath or PICC line.   We strive to give you quality time with your provider. You may need to reschedule your appointment if you arrive late (15 or more minutes).  Arriving late affects you and other patients whose appointments are after yours.  Also, if you miss three or more appointments without notifying the office, you may be dismissed from the clinic at the providers discretion.      For prescription refill requests, have your pharmacy contact our office and allow 72 hours for refills to be completed.    Today you received the following chemotherapy and/or immunotherapy agents: Keytruda      To help prevent nausea and vomiting after your treatment, we encourage you to take your nausea medication as directed.  BELOW ARE SYMPTOMS THAT SHOULD BE REPORTED IMMEDIATELY: *FEVER GREATER THAN 100.4 F (38 C) OR HIGHER *CHILLS OR SWEATING *NAUSEA AND VOMITING THAT IS NOT CONTROLLED WITH YOUR NAUSEA MEDICATION *UNUSUAL SHORTNESS OF BREATH *UNUSUAL BRUISING OR BLEEDING *URINARY PROBLEMS (pain or burning when urinating, or frequent urination) *BOWEL PROBLEMS (unusual diarrhea, constipation, pain near the anus) TENDERNESS IN MOUTH AND THROAT WITH OR WITHOUT PRESENCE OF ULCERS (sore throat, sores in mouth, or a toothache) UNUSUAL RASH, SWELLING OR PAIN  UNUSUAL VAGINAL DISCHARGE OR ITCHING   Items with * indicate a potential emergency and should be followed up as soon as possible or go to the Emergency Department if any problems should occur.  Please show the CHEMOTHERAPY ALERT CARD or IMMUNOTHERAPY ALERT CARD at check-in to the  Emergency Department and triage nurse.  Should you have questions after your visit or need to cancel or reschedule your appointment, please contact [facility]  Dept: [phone]  and follow the prompts.  Office hours are 8:00 a.m. to 4:30 p.m. Monday - Friday. Please note that voicemails left after 4:00 p.m. may not be returned until the following business day.  We are closed weekends and major holidays. You have access to a nurse at all times for urgent questions. Please call the main number to the clinic Dept: [phone] and follow the prompts.  For any non-urgent questions, you may also contact your provider using MyChart. We now offer e-Visits for anyone 60 and older to request care online for non-urgent symptoms. For details visit [email or web address].   Also download the MyChart app! Go to the app store, search "MyChart", open the app, select [facility], and log in with your MyChart username and password.  Due to Covid, a mask is required upon entering the hospital/clinic. If you do not have a mask, one will be given to you upon arrival. For doctor visits, patients may have 1 support person aged 89 or older with them. For treatment visits, patients cannot have anyone with them due to current Covid guidelines and our immunocompromised population.   Pembrolizumab injection What is this medication? PEMBROLIZUMAB (pem broe liz ue mab) is a monoclonal antibody. It is used to treat certain types of cancer. This medicine may be used for other purposes; ask your health care provider or pharmacist if you have questions. COMMON BRAND NAME(S): Keytruda What should I  tell my care team before I take this medication? They need to know if you have any of these conditions: autoimmune diseases like Crohn's disease, ulcerative colitis, or lupus have had or planning to have an allogeneic stem cell transplant (uses someone else's stem cells) history of organ transplant history  of chest radiation nervous system problems like myasthenia gravis or Guillain-Barre syndrome an unusual or allergic reaction to pembrolizumab, other medicines, foods, dyes, or preservatives pregnant or trying to get pregnant breast-feeding How should I use this medication? This medicine is for infusion into a vein. It is given by a health care professional in a hospital or clinic setting. A special MedGuide will be given to you before each treatment. Be sure to read this information carefully each time. Talk to your pediatrician regarding the use of this medicine in children. While this drug may be prescribed for children as young as 6 months for selected conditions, precautions do apply. Overdosage: If you think you have taken too much of this medicine contact a poison control center or emergency room at once. NOTE: This medicine is only for you. Do not share this medicine with others. What if I miss a dose? It is important not to miss your dose. Call your doctor or health care professional if you are unable to keep an appointment. What may interact with this medication? Interactions have not been studied. This list may not describe all possible interactions. Give your health care provider a list of all the medicines, herbs, non-prescription drugs, or dietary supplements you use. Also tell them if you smoke, drink alcohol, or use illegal drugs. Some items may interact with your medicine. What should I watch for while using this medication? Your condition will be monitored carefully while you are receiving this medicine. You may need blood work done while you are taking this medicine. Do not become pregnant while taking this medicine or for 4 months after stopping it. Women should inform their doctor if they wish to become pregnant or think they might be pregnant. There is a potential for serious side effects to an unborn child. Talk to your health care professional or pharmacist for more  information. Do not breast-feed an infant while taking this medicine or for 4 months after the last dose. What side effects may I notice from receiving this medication? Side effects that you should report to your doctor or health care professional as soon as possible: allergic reactions like skin rash, itching or hives, swelling of the face, lips, or tongue bloody or black, tarry breathing problems changes in vision chest pain chills confusion constipation cough diarrhea dizziness or feeling faint or lightheaded fast or irregular heartbeat fever flushing joint pain low blood counts - this medicine may decrease the number of white blood cells, red blood cells and platelets. You may be at increased risk for infections and bleeding. muscle pain muscle weakness pain, tingling, numbness in the hands or feet persistent headache redness, blistering, peeling or loosening of the skin, including inside the mouth signs and symptoms of high blood sugar such as dizziness; dry mouth; dry skin; fruity breath; nausea; stomach pain; increased hunger or thirst; increased urination signs and symptoms of kidney injury like trouble passing urine or change in the amount of urine signs and symptoms of liver injury like dark urine, light-colored stools, loss of appetite, nausea, right upper belly pain, yellowing of the eyes or skin sweating swollen lymph nodes weight loss Side effects that usually do not require medical attention (report  to your doctor or health care professional if they continue or are bothersome): decreased appetite hair loss tiredness This list may not describe all possible side effects. Call your doctor for medical advice about side effects. You may report side effects to FDA at 1-800-FDA-1088. Where should I keep my medication? This drug is given in a hospital or clinic and will not be stored at home. NOTE: This sheet is a summary. It may not cover all possible information. If you  have questions about this medicine, talk to your doctor, pharmacist, or health care provider.  2022 Elsevier/Gold Standard (2021-08-07 00:00:00)

## 2021-12-11 NOTE — Progress Notes (Signed)
Per Daughter, Pt is taking Calcium supplement, unsure of dosage

## 2021-12-15 ENCOUNTER — Other Ambulatory Visit: Payer: Self-pay | Admitting: Hematology and Oncology

## 2021-12-15 DIAGNOSIS — E876 Hypokalemia: Secondary | ICD-10-CM

## 2021-12-17 ENCOUNTER — Encounter: Payer: Self-pay | Admitting: Oncology

## 2021-12-18 ENCOUNTER — Other Ambulatory Visit: Payer: Self-pay

## 2021-12-18 ENCOUNTER — Encounter: Payer: Self-pay | Admitting: Oncology

## 2021-12-18 DIAGNOSIS — C3492 Malignant neoplasm of unspecified part of left bronchus or lung: Secondary | ICD-10-CM

## 2021-12-18 DIAGNOSIS — C7951 Secondary malignant neoplasm of bone: Secondary | ICD-10-CM

## 2021-12-18 DIAGNOSIS — M25551 Pain in right hip: Secondary | ICD-10-CM

## 2021-12-18 MED ORDER — OXYCODONE HCL 10 MG PO TABS: 10.0000 mg | ORAL_TABLET | ORAL | 0 refills | Status: DC | PRN

## 2021-12-19 ENCOUNTER — Encounter: Payer: Self-pay | Admitting: Oncology

## 2021-12-24 NOTE — Progress Notes (Signed)
[facility]  [address] [place],  [place]  [address] [phone]  Clinic Day:  12/28/2021  Referring physician: Maryella Shivers, MD  This document serves as a record of services personally performed by [name], MD. It was created on their behalf by [facility] E, a trained medical scribe. The creation of this record is based on the scribe's personal observations and the provider's statements to them.  HISTORY OF PRESENT ILLNESS:  The patient is an 88 y.o. male  stage IVA (T3 N0 M1b) squamous cell lung cancer.   He comes in today prior to a 2nd cycle of maintenance pembrolizumab.  This was preceded by 4 cycles of carboplatin/nab-paclitaxel/pembrolizumab. Overall, the patient has been doing well.  He has already undergone palliative radiation to his right acetabular region.  He still has pain in his right hip region, but it is manageable with his narcotic pain medication.  He denies having other symptoms/findings which concern him for overt signs of disease progression.    PHYSICAL EXAM: Blood pressure 138/72, pulse 72, temperature 98 F (36.7 C), resp. rate 16, height 5\' 9"  (1.753 m), weight 129 lb 12.8 oz (58.9 kg), SpO2 96 %. Wt Readings from Last 3 Encounters:  12/28/21 129 lb 12.8 oz (58.9 kg)  12/27/21 129 lb (58.5 kg)  12/11/21 132 lb 12 oz (60.2 kg)   Body mass index is 19.17 kg/m. Performance status (ECOG): 2 - Symptomatic, <50% confined to bed Physical Exam Constitutional:      General: He is not in acute distress.    Appearance: Normal appearance. He is normal weight.     Comments: Thin gentleman in a wheelchair  HENT:     Head: Normocephalic and atraumatic.  Eyes:     General: No scleral icterus.    Extraocular Movements: Extraocular movements intact.     Conjunctiva/sclera: Conjunctivae normal.     Pupils: Pupils are equal, round, and reactive to light.  Cardiovascular:     Rate and Rhythm: Normal rate and regular  rhythm.     Pulses: Normal pulses.     Heart sounds: Normal heart sounds. No murmur heard.   No friction rub. No gallop.  Pulmonary:     Effort: Pulmonary effort is normal. No respiratory distress.     Breath sounds: Normal breath sounds.  Abdominal:     General: Bowel sounds are normal. There is no distension.     Palpations: Abdomen is soft. There is no hepatomegaly, splenomegaly or mass.     Tenderness: There is no abdominal tenderness.  Musculoskeletal:        General: Normal range of motion.     Cervical back: Normal range of motion and neck supple.     Right lower leg: No edema.     Left lower leg: No edema.  Lymphadenopathy:     Cervical: No cervical adenopathy.  Skin:    General: Skin is warm and dry.  Neurological:     General: No focal deficit present.     Mental Status: He is alert and oriented to person, place, and time. Mental status is at baseline.  Psychiatric:        Mood and Affect: Mood normal.        Behavior: Behavior normal.        Thought Content: Thought content normal.        Judgment: Judgment normal.    LABS:    Latest Reference Range & Units 12/28/21 00:00  Sodium  137 - 147  139 (E)  Potassium 3.4 - 5.3  3.9 (E)  Chloride 99 - 108  107 (E)  CO2 13 - 22  27 ! (E)  Glucose  126 (E)  BUN 4 - 21  13 (E)  Creatinine 0.6 - 1.3  1.0 (E)  Calcium 8.7 - 10.7  8.6 ! (E)  Alkaline Phosphatase 25 - 125  124 (E)  Albumin 3.5 - 5.0  3.6 (E)  AST 14 - 40  19 (E)  ALT 10 - 40  10 (E)  Bilirubin, Total  0.6 (E)  WBC  3.6 (E)  RBC 3.87 - 5.11  3.28 ! (E)  Hemoglobin 13.5 - 17.5  9.1 ! (E)  HCT 41 - 53  30 ! (E)  Platelets 150 - 399  230 (E)  NEUT#  2.05 (E)    ASSESSMENT & PLAN:  An 88 y.o. male with stage IVA (T3 N0 M1b) squamous cell lung cancer, which includes spread of disease to his right hip. Of note, he completed 4 cycles of carboplatin/nab-paclitaxel/pembrolizumab in December 2022. He will proceed with a 2nd cycle of maintenance pembrolizumab.   As he does have chemotherapy-induced anemia, I will continue to give him Retacrit 40,000 units with each cycle of immunotherapy.  As his chemotherapy is being stopped, I anticipate his hemoglobin will improve over time.  He will continue taking Zometa for his metastatic bone disease. Otherwise, I will see him back in 3 weeks before he heads into his 3rd cycle of maintenance pembrolizumab immunotherapy.  The patient understands all the plans discussed today and is in agreement with them.   I, Rita Ohara, am acting as scribe for [name], MD    I have reviewed this report as typed by the medical scribe, and it is complete and accurate.  Dequincy Macarthur Critchley, MD

## 2021-12-27 ENCOUNTER — Other Ambulatory Visit: Payer: Self-pay

## 2021-12-27 ENCOUNTER — Ambulatory Visit: Payer: Medicare Other | Admitting: Cardiology

## 2021-12-27 ENCOUNTER — Encounter: Payer: Self-pay | Admitting: Cardiology

## 2021-12-27 VITALS — BP 157/81 | HR 70 | Temp 97.7°F | Ht 69.0 in | Wt 129.0 lb

## 2021-12-27 DIAGNOSIS — I5022 Chronic systolic (congestive) heart failure: Secondary | ICD-10-CM

## 2021-12-27 DIAGNOSIS — I10 Essential (primary) hypertension: Secondary | ICD-10-CM

## 2021-12-27 DIAGNOSIS — I340 Nonrheumatic mitral (valve) insufficiency: Secondary | ICD-10-CM

## 2021-12-27 DIAGNOSIS — I4819 Other persistent atrial fibrillation: Secondary | ICD-10-CM | POA: Insufficient documentation

## 2021-12-27 NOTE — Progress Notes (Signed)
Follow up visit   Subjective:   Robert Hamilton, male    DOB: 04-12-1934, 88 y.o.   MRN: [id number]   Chief Complaint  Patient presents with   Congestive Heart Failure   Follow-up    HPI   88 year old African-American male with hypertension, CKD, h/o tobacco and alcohol abuse, HFrEF, h/o NSVT, diverticular bleed in 03/2019   Since his last visit with me, patient was diagnosed with pulmonary embolism, secondary to a new diagnosis of stage IV metastatic squamous cell lung cancer.  He is currently on chemotherapy with carboplatin/nab-paclitaxel/pembrolizumab.  He is doing fairly well.  Shortness of breath is unchanged.  He denies any leg edema, orthopnea, PND symptoms.  Blood pressure elevated today, but usually much lower than this.  He is currently taking Eliquis for management of PE.  Current Outpatient Medications on File Prior to Visit  Medication Sig Dispense Refill   allopurinol (ZYLOPRIM) 100 MG tablet Take 100 mg by mouth daily.     dexamethasone (DECADRON) 4 MG tablet Take 5 tabs the day before and 5 tab the morning of chemotherapy, every 3 weeks, by mouth 60 tablet 0   ENTRESTO 24-26 MG TAKE 1 TABLET BY MOUTH TWICE DAILY 60 tablet 1   folic acid (FOLVITE) 1 MG tablet Take 1 mg by mouth daily.     metoprolol succinate (TOPROL-XL) 25 MG 24 hr tablet TAKE 1/2 TABLET(12.5 MG) BY MOUTH DAILY 30 tablet 1   ondansetron (ZOFRAN) 4 MG tablet Take 1 tablet (4 mg total) by mouth every 4 (four) hours as needed for nausea. 90 tablet 3   Oxycodone HCl 10 MG TABS Take 1 tablet (10 mg total) by mouth every 4 (four) hours as needed. 120 tablet 0   pantoprazole (PROTONIX) 40 MG tablet Take 40 mg by mouth daily.     potassium chloride SA (KLOR-CON M) 20 MEQ tablet TAKE 1 TABLET BY MOUTH ONCE DAILY 30 tablet 0   prochlorperazine (COMPAZINE) 10 MG tablet Take 1 tablet (10 mg total) by mouth every 6 (six) hours as needed for nausea or vomiting. 90 tablet 3   thiamine (VITAMIN B-1) 100 MG tablet  Take 100 mg by mouth daily.     acetaminophen (TYLENOL) 325 MG tablet Take 2 tablets (650 mg total) by mouth every 6 (six) hours as needed for mild pain or fever (or Fever >/= 101).     albuterol (VENTOLIN HFA) 108 (90 Base) MCG/ACT inhaler INHALE 2 PUFFS BY MOUTH EVERY 4 HOURS     APIXABAN (ELIQUIS) VTE STARTER PACK (10MG AND 5MG) Take as directed on package: start with two-55m tablets twice daily for 7 days. On day 8, switch to one-557mtablet twice daily. 1 each 0   diclofenac (VOLTAREN) 50 MG EC tablet Take 50 mg by mouth 3 (three) times daily. (Patient not taking: Reported on 12/27/2021)     gabapentin (NEURONTIN) 300 MG capsule Take 300 mg by mouth 3 (three) times daily. (Patient not taking: Reported on 12/27/2021)     losartan (COZAAR) 50 MG tablet Take by mouth. (Patient not taking: Reported on 12/27/2021)     predniSONE (STERAPRED UNI-PAK 21 TAB) 5 MG (21) TBPK tablet See admin instructions. (Patient not taking: Reported on 09/27/2021)     No current facility-administered medications on file prior to visit.    Cardiovascular studies:  EKG 12/27/2021: Atrial fibrillation 85 bpm Left anterior fascicular block  Old anterior infarct Poor R-wave progression  Echocardiogram 04/26/2021:  Moderately depressed LV systolic function  with visual EF 35-40%. Left  ventricle cavity is normal in size. Hypokinetic global wall motion.  Doppler evidence of grade II (pseudonormal) diastolic dysfunction, normal  LAP. Moderate left ventricular hypertrophy.  Left atrial cavity is severely dilated.  Trace aortic regurgitation.  Moderate to severe mitral regurgitation.  Moderate to severe tricuspid regurgitation. Mild pulmonary hypertension.  RVSP measures 41 mmHg.  Compared to study dated 07/08/2019: LVEF improved from 30-35% to 35-40%,  LAE is now severe, otherwise no significant change.   EKG 04/18/2021: Sinus rhythm 83 bpm Left bundle branch block Occasional PAC     Holter monitor 07/19/2019:   Dominant rhythm sinus. HR 45-102 bpm. Avg HR 69 bpm.  9.8% ventricular ectopy.  3.8% supraventricular ectopy.  No AFib/aflutter/>3sec pauses/high grade AV block/sustained VT.   Lexiscan nuclear stress test 03/22/2019: 1. No reversible ischemia or infarction. 2. Minimal septal and inferior hypokinesis. 3. Left ventricular ejection fraction 40% 4. Non invasive risk stratification*: Low   Recent labs: 07/18/2020: Glucose 109, BUN/Cr 15/1.28. EGFR 51 HbA1C 6.2%  01/13/2020: Glucose 108, BUN/Cr 15/1.47. EGFR 43  11/2019: HbA1C 5.7% Chol 178, TG 79, HDL 77, LDL 134  06/2019: TSH 2.3 normal    03/24/2019: Glucose 108, BUN/Cr 14/1.37. EGFR 48. Na/K 142/4.1.  H/H 9/27.4. MCV 85. Platelets 126   Review of Systems  Cardiovascular:  Positive for dyspnea on exertion. Negative for chest pain, leg swelling, orthopnea, palpitations and syncope.  Neurological:  Positive for dizziness.      Vitals:   12/27/21 1044 12/27/21 1045  BP: (!) 168/81 (!) 157/81  Pulse: 69 70  Temp: 97.7 F (36.5 C)   SpO2: 96%      Objective:   Physical Exam Vitals and nursing note reviewed.  Constitutional:      General: He is not in acute distress. Neck:     Vascular: No JVD.  Cardiovascular:     Rate and Rhythm: Normal rate. Rhythm irregular.     Heart sounds: Murmur heard.  High-pitched blowing holosystolic murmur is present with a grade of 3/6 at the apex.  Pulmonary:     Effort: Pulmonary effort is normal.     Breath sounds: Normal breath sounds. No wheezing or rales.  Musculoskeletal:     Right lower leg: No edema.     Left lower leg: No edema.        Assessment & Recommendations:   88 year old African-American male with hypertension, CKD, h/o tobacco and alcohol abuse, HFrEF, h/o NSVT, diverticular bleed in 03/2019, stage IVA (T3 N0 M1b) squamous cell lung cancer, h/o PE (06/2021), likely secondary to metastatic lung cancer, now with new diagnosis of A. Fib  Atrial  fibrillation: Noted on EKG today.  Unclear chronicity, likely persistent. CHA2DS2-VASc score 4, annual stroke risk 4.8% Currently on Eliquis 5 mg twice daily for treatment of PE.  Continue the same. In absence of symptoms and controlled rate, I do not recommend rhythm control therapy.  HFrEF: EF 35-40%.  Most likely nonischemic, chronic.   He is euvolemic on exam.   No change made today to his heart failure therapy, which includes Entresto 24-26 mg twice daily, metoprolol succinate 12.5 mg daily, Lasix daily for now.  He is on chemotherapy that could potentially be cardiotoxic.  However, in absence of any change in symptomatology, I have not repeated echocardiogram at this time.  Hypertension Controlled  Hyperlipidemia: Not on statin due to age and cardiomyopathy.   Follow-up in 6 months  Antoria Lanza Esther Hardy, MD [facility] [place]  Pager: [phone] Office: [phone] If no answer Cell [phone]

## 2021-12-28 ENCOUNTER — Inpatient Hospital Stay: Payer: Medicare Other

## 2021-12-28 ENCOUNTER — Inpatient Hospital Stay (INDEPENDENT_AMBULATORY_CARE_PROVIDER_SITE_OTHER): Payer: Medicare Other | Admitting: Oncology

## 2021-12-28 ENCOUNTER — Other Ambulatory Visit: Payer: Self-pay | Admitting: Hematology and Oncology

## 2021-12-28 ENCOUNTER — Telehealth: Payer: Self-pay | Admitting: Oncology

## 2021-12-28 VITALS — BP 138/72 | HR 72 | Temp 98.0°F | Resp 16 | Ht 69.0 in | Wt 129.8 lb

## 2021-12-28 DIAGNOSIS — C3492 Malignant neoplasm of unspecified part of left bronchus or lung: Secondary | ICD-10-CM | POA: Diagnosis not present

## 2021-12-28 DIAGNOSIS — D6481 Anemia due to antineoplastic chemotherapy: Secondary | ICD-10-CM | POA: Diagnosis not present

## 2021-12-28 DIAGNOSIS — Z79899 Other long term (current) drug therapy: Secondary | ICD-10-CM | POA: Diagnosis not present

## 2021-12-28 DIAGNOSIS — Z5112 Encounter for antineoplastic immunotherapy: Secondary | ICD-10-CM | POA: Diagnosis not present

## 2021-12-28 LAB — TSH: TSH: 2.391 u[IU]/mL (ref 0.350–4.500)

## 2021-12-28 LAB — CBC AND DIFFERENTIAL
HCT: 30 — AB (ref 41–53)
Hemoglobin: 9.1 — AB (ref 13.5–17.5)
Neutrophils Absolute: 2.05
Platelets: 230 (ref 150–399)
WBC: 3.6

## 2021-12-28 LAB — CBC: RBC: 3.28 — AB (ref 3.87–5.11)

## 2021-12-28 LAB — BASIC METABOLIC PANEL
BUN: 13 (ref 4–21)
CO2: 27 — AB (ref 13–22)
Chloride: 107 (ref 99–108)
Creatinine: 1 (ref 0.6–1.3)
Glucose: 126
Potassium: 3.9 (ref 3.4–5.3)
Sodium: 139 (ref 137–147)

## 2021-12-28 LAB — HEPATIC FUNCTION PANEL
ALT: 10 (ref 10–40)
AST: 19 (ref 14–40)
Alkaline Phosphatase: 124 (ref 25–125)
Bilirubin, Total: 0.6

## 2021-12-28 LAB — COMPREHENSIVE METABOLIC PANEL
Albumin: 3.6 (ref 3.5–5.0)
Calcium: 8.6 — AB (ref 8.7–10.7)

## 2021-12-28 NOTE — Telephone Encounter (Signed)
Patient has been scheduled for follow-up visit per 12/28/21 los. Pt given an appt calendar with date and time.

## 2021-12-28 NOTE — Progress Notes (Signed)
[facility]  [address] [place],  [place]  [address] [phone]  Clinic Day:  12/07/2021  Referring physician: Maryella Shivers, MD  This document serves as a record of services personally performed by [name], MD. It was created on their behalf by [facility], a trained medical scribe. The creation of this record is based on the scribe's personal observations and the provider's statements to them.  HISTORY OF PRESENT ILLNESS:  The patient is an 88 y.o. male  stage IVA (T3 N0 M1b) squamous cell lung cancer.   He comes in today to be evaluated before heading into his 2nd cycle of maintenance pembrolizumab.  This was preceded by 4 cycles of chemotherapy, which consisted of carboplatin/nab-paclitaxel/pembrolizumab.  Overall, the patient has been doing okay.  He has already undergone palliative radiation to his right acetabular region, which is his known area of metastatic bone disease.  He still has pain in his right hip region, but it is manageable with his narcotic pain medication.  He denies having other symptoms/findings which concern him for overt signs of disease progression.    PHYSICAL EXAM: Blood pressure 138/72, pulse 72, temperature 98 F (36.7 C), resp. rate 16, height 5\' 9"  (1.753 m), weight 129 lb 12.8 oz (58.9 kg), SpO2 96 %. Wt Readings from Last 3 Encounters:  12/28/21 129 lb 12.8 oz (58.9 kg)  12/27/21 129 lb (58.5 kg)  12/11/21 132 lb 12 oz (60.2 kg)   Body mass index is 19.17 kg/m. Performance status (ECOG): 2 - Symptomatic, <50% confined to bed Physical Exam Constitutional:      General: He is not in acute distress.    Appearance: Normal appearance. He is normal weight.     Comments: Thin gentleman in a wheelchair  HENT:     Head: Normocephalic and atraumatic.  Eyes:     General: No scleral icterus.    Extraocular Movements: Extraocular movements intact.     Conjunctiva/sclera: Conjunctivae normal.     Pupils:  Pupils are equal, round, and reactive to light.  Cardiovascular:     Rate and Rhythm: Normal rate and regular rhythm.     Pulses: Normal pulses.     Heart sounds: Normal heart sounds. No murmur heard.   No friction rub. No gallop.  Pulmonary:     Effort: Pulmonary effort is normal. No respiratory distress.     Breath sounds: Normal breath sounds.  Abdominal:     General: Bowel sounds are normal. There is no distension.     Palpations: Abdomen is soft. There is no hepatomegaly, splenomegaly or mass.     Tenderness: There is no abdominal tenderness.  Musculoskeletal:        General: Normal range of motion.     Cervical back: Normal range of motion and neck supple.     Right lower leg: No edema.     Left lower leg: No edema.  Lymphadenopathy:     Cervical: No cervical adenopathy.  Skin:    General: Skin is warm and dry.  Neurological:     General: No focal deficit present.     Mental Status: He is alert and oriented to person, place, and time. Mental status is at baseline.  Psychiatric:        Mood and Affect: Mood normal.        Behavior: Behavior normal.        Thought Content: Thought content normal.        Judgment:  Judgment normal.   LABS:    Latest Reference Range & Units 12/28/21 00:00  WBC  3.6 (E)  RBC 3.87 - 5.11  3.28 ! (E)  Hemoglobin 13.5 - 17.5  9.1 ! (E)  HCT 41 - 53  30 ! (E)  Platelets 150 - 399  230 (E)  !: Data is abnormal (E): External lab result   Latest Reference Range & Units 12/28/21 00:00  Sodium 137 - 147  139 (E)  Potassium 3.4 - 5.3  3.9 (E)  Chloride 99 - 108  107 (E)  CO2 13 - 22  27 ! (E)  Glucose  126 (E)  BUN 4 - 21  13 (E)  Creatinine 0.6 - 1.3  1.0 (E)  Calcium 8.7 - 10.7  8.6 ! (E)  Alkaline Phosphatase 25 - 125  124 (E)  Albumin 3.5 - 5.0  3.6 (E)  AST 14 - 40  19 (E)  ALT 10 - 40  10 (E)  Bilirubin, Total  0.6 (E)  !: Data is abnormal (E): External lab result   ASSESSMENT & PLAN:  An 88 y.o. male with stage IVA (T3 N0  M1b) squamous cell lung cancer, which includes spread of disease to his right hip.  He will proceed with his 2nd cycle of maintenance pembrolizumab early next week.  As he does have chemotherapy-induced anemia, I will continue to give him Retacrit 40,000 units with each cycle of immunotherapy.  As his chemotherapy is being stopped, I anticipate his hemoglobin will improve over time.  He will continue taking Zometa for his metastatic bone disease.  I will see him back in 3 weeks before he heads into his 3rd cycle of maintenance pembrolizumab immunotherapy.  The patient understands all the plans discussed today and is in agreement with them.   I, Rita Ohara, am acting as scribe for [name], MD    I have reviewed this report as typed by the medical scribe, and it is complete and accurate.  Khalid Lacko Macarthur Critchley, MD

## 2021-12-30 LAB — T4: T4, Total: 6.5 ug/dL (ref 4.5–12.0)

## 2021-12-31 MED FILL — Pembrolizumab IV Soln 100 MG/4ML (25 MG/ML): INTRAVENOUS | Qty: 8 | Status: AC

## 2021-12-31 MED FILL — Zoledronic Acid Inj Conc For IV Infusion 4 MG/5ML: INTRAVENOUS | Qty: 3.75 | Status: AC

## 2022-01-01 ENCOUNTER — Inpatient Hospital Stay: Payer: Medicare Other

## 2022-01-01 ENCOUNTER — Other Ambulatory Visit: Payer: Self-pay

## 2022-01-01 VITALS — BP 161/79 | HR 82 | Temp 97.4°F | Resp 18 | Ht 69.0 in | Wt 130.8 lb

## 2022-01-01 DIAGNOSIS — Z5112 Encounter for antineoplastic immunotherapy: Secondary | ICD-10-CM | POA: Diagnosis not present

## 2022-01-01 DIAGNOSIS — D6481 Anemia due to antineoplastic chemotherapy: Secondary | ICD-10-CM | POA: Diagnosis not present

## 2022-01-01 DIAGNOSIS — Z79899 Other long term (current) drug therapy: Secondary | ICD-10-CM | POA: Diagnosis not present

## 2022-01-01 DIAGNOSIS — C3492 Malignant neoplasm of unspecified part of left bronchus or lung: Secondary | ICD-10-CM | POA: Diagnosis not present

## 2022-01-01 MED ORDER — EPOETIN ALFA-EPBX 40000 UNIT/ML IJ SOLN
40000.0000 [IU] | Freq: Once | INTRAMUSCULAR | Status: AC
  Administered 2022-01-01: 40000 [IU] via SUBCUTANEOUS
  Filled 2022-01-01: qty 1

## 2022-01-01 MED ORDER — SODIUM CHLORIDE 0.9 % IV SOLN: Freq: Once | INTRAVENOUS | Status: AC

## 2022-01-01 MED ORDER — HEPARIN SOD (PORK) LOCK FLUSH 100 UNIT/ML IV SOLN
500.0000 [IU] | Freq: Once | INTRAVENOUS | Status: AC | PRN
  Administered 2022-01-01: 500 [IU]

## 2022-01-01 MED ORDER — SODIUM CHLORIDE 0.9% FLUSH
10.0000 mL | INTRAVENOUS | Status: DC | PRN
  Administered 2022-01-01: 10 mL

## 2022-01-01 MED ORDER — SODIUM CHLORIDE 0.9 % IV SOLN
200.0000 mg | Freq: Once | INTRAVENOUS | Status: AC
  Administered 2022-01-01: 200 mg via INTRAVENOUS
  Filled 2022-01-01: qty 8

## 2022-01-01 MED ORDER — ZOLEDRONIC ACID 4 MG/5ML IV CONC
3.0000 mg | Freq: Once | INTRAVENOUS | Status: AC
  Administered 2022-01-01: 3 mg via INTRAVENOUS
  Filled 2022-01-01: qty 3.75

## 2022-01-01 MED ORDER — SODIUM CHLORIDE 0.9 % IV SOLN: Freq: Once | INTRAVENOUS | Status: DC

## 2022-01-01 NOTE — Patient Instructions (Addendum)
[facility]  Discharge Instructions: Thank you for choosing [facility] to provide your oncology and hematology care.  If you have a lab appointment with the [facility], please go directly to the [facility] and check in at the registration area.   Wear comfortable clothing and clothing appropriate for easy access to any Portacath or PICC line.   We strive to give you quality time with your provider. You may need to reschedule your appointment if you arrive late (15 or more minutes).  Arriving late affects you and other patients whose appointments are after yours.  Also, if you miss three or more appointments without notifying the office, you may be dismissed from the clinic at the providers discretion.      For prescription refill requests, have your pharmacy contact our office and allow 72 hours for refills to be completed.    Today you received the following chemotherapy and/or immunotherapy agents pembrolizumab      To help prevent nausea and vomiting after your treatment, we encourage you to take your nausea medication as directed.  BELOW ARE SYMPTOMS THAT SHOULD BE REPORTED IMMEDIATELY: *FEVER GREATER THAN 100.4 F (38 C) OR HIGHER *CHILLS OR SWEATING *NAUSEA AND VOMITING THAT IS NOT CONTROLLED WITH YOUR NAUSEA MEDICATION *UNUSUAL SHORTNESS OF BREATH *UNUSUAL BRUISING OR BLEEDING *URINARY PROBLEMS (pain or burning when urinating, or frequent urination) *BOWEL PROBLEMS (unusual diarrhea, constipation, pain near the anus) TENDERNESS IN MOUTH AND THROAT WITH OR WITHOUT PRESENCE OF ULCERS (sore throat, sores in mouth, or a toothache) UNUSUAL RASH, SWELLING OR PAIN  UNUSUAL VAGINAL DISCHARGE OR ITCHING   Items with * indicate a potential emergency and should be followed up as soon as possible or go to the Emergency Department if any problems should occur.  Please show the CHEMOTHERAPY ALERT CARD or IMMUNOTHERAPY ALERT CARD at check-in to the  Emergency Department and triage nurse.  Should you have questions after your visit or need to cancel or reschedule your appointment, please contact [facility]  Dept: [phone]  and follow the prompts.  Office hours are 8:00 a.m. to 4:30 p.m. Monday - Friday. Please note that voicemails left after 4:00 p.m. may not be returned until the following business day.  We are closed weekends and major holidays. You have access to a nurse at all times for urgent questions. Please call the main number to the clinic Dept: [phone] and follow the prompts.  For any non-urgent questions, you may also contact your provider using MyChart. We now offer e-Visits for anyone 35 and older to request care online for non-urgent symptoms. For details visit [email or web address].   Also download the MyChart app! Go to the app store, search "MyChart", open the app, select [facility], and log in with your MyChart username and password.  Due to Covid, a mask is required upon entering the hospital/clinic. If you do not have a mask, one will be given to you upon arrival. For doctor visits, patients may have 1 support person aged 46 or older with them. For treatment visits, patients cannot have anyone with them due to current Covid guidelines and our immunocompromised population.   Zoledronic Acid Injection (Hypercalcemia, Oncology) What is this medication? ZOLEDRONIC ACID (ZOE le dron ik AS id) slows calcium loss from bones. It high calcium levels in the blood from some kinds of cancer. It may be used in other people at risk for bone loss. This medicine may be used  for other purposes; ask your health care provider or pharmacist if you have questions. COMMON BRAND NAME(S): Zometa What should I tell my care team before I take this medication? They need to know if you have any of these conditions: cancer dehydration dental disease kidney disease liver disease low levels of calcium in the  blood lung or breathing disease (asthma) receiving steroids like dexamethasone or prednisone an unusual or allergic reaction to zoledronic acid, other medicines, foods, dyes, or preservatives pregnant or trying to get pregnant breast-feeding How should I use this medication? This drug is injected into a vein. It is given by a health care provider in a hospital or clinic setting. Talk to your health care provider about the use of this drug in children. Special care may be needed. Overdosage: If you think you have taken too much of this medicine contact a poison control center or emergency room at once. NOTE: This medicine is only for you. Do not share this medicine with others. What if I miss a dose? Keep appointments for follow-up doses. It is important not to miss your dose. Call your health care provider if you are unable to keep an appointment. What may interact with this medication? certain antibiotics given by injection NSAIDs, medicines for pain and inflammation, like ibuprofen or naproxen some diuretics like bumetanide, furosemide teriparatide thalidomide This list may not describe all possible interactions. Give your health care provider a list of all the medicines, herbs, non-prescription drugs, or dietary supplements you use. Also tell them if you smoke, drink alcohol, or use illegal drugs. Some items may interact with your medicine. What should I watch for while using this medication? Visit your health care provider for regular checks on your progress. It may be some time before you see the benefit from this drug. Some people who take this drug have severe bone, joint, or muscle pain. This drug may also increase your risk for jaw problems or a broken thigh bone. Tell your health care provider right away if you have severe pain in your jaw, bones, joints, or muscles. Tell you health care provider if you have any pain that does not go away or that gets worse. Tell your dentist and  dental surgeon that you are taking this drug. You should not have major dental surgery while on this drug. See your dentist to have a dental exam and fix any dental problems before starting this drug. Take good care of your teeth while on this drug. Make sure you see your dentist for regular follow-up appointments. You should make sure you get enough calcium and vitamin D while you are taking this drug. Discuss the foods you eat and the vitamins you take with your health care provider. Check with your health care provider if you have severe diarrhea, nausea, and vomiting, or if you sweat a lot. The loss of too much body fluid may make it dangerous for you to take this drug. You may need blood work done while you are taking this drug. Do not become pregnant while taking this drug. Women should inform their health care provider if they wish to become pregnant or think they might be pregnant. There is potential for serious harm to an unborn child. Talk to your health care provider for more information. What side effects may I notice from receiving this medication? Side effects that you should report to your doctor or health care provider as soon as possible: allergic reactions (skin rash, itching or hives; swelling of  the face, lips, or tongue) bone pain infection (fever, chills, cough, sore throat, pain or trouble passing urine) jaw pain, especially after dental work joint pain kidney injury (trouble passing urine or change in the amount of urine) low blood pressure (dizziness; feeling faint or lightheaded, falls; unusually weak or tired) low calcium levels (fast heartbeat; muscle cramps or pain; pain, tingling, or numbness in the hands or feet; seizures) low magnesium levels (fast, irregular heartbeat; muscle cramp or pain; muscle weakness; tremors; seizures) low red blood cell counts (trouble breathing; feeling faint; lightheaded, falls; unusually weak or tired) muscle pain redness, blistering,  peeling, or loosening of the skin, including inside the mouth severe diarrhea swelling of the ankles, feet, hands trouble breathing Side effects that usually do not require medical attention (report to your doctor or health care provider if they continue or are bothersome): anxious constipation coughing depressed mood eye irritation, itching, or pain fever general ill feeling or flu-like symptoms nausea pain, redness, or irritation at site where injected trouble sleeping This list may not describe all possible side effects. Call your doctor for medical advice about side effects. You may report side effects to FDA at 1-800-FDA-1088. Where should I keep my medication? This drug is given in a hospital or clinic. It will not be stored at home. NOTE: This sheet is a summary. It may not cover all possible information. If you have questions about this medicine, talk to your doctor, pharmacist, or health care provider.  2022 Elsevier/Gold Standard (2021-08-07 00:00:00) Epoetin Alfa injection What is this medication? EPOETIN ALFA (e POE e tin AL fa) helps your body make more red blood cells. This medicine is used to treat anemia caused by chronic kidney disease, cancer chemotherapy, or HIV-therapy. It may also be used before surgery if you have anemia. This medicine may be used for other purposes; ask your health care provider or pharmacist if you have questions. COMMON BRAND NAME(S): Epogen, Procrit, Retacrit What should I tell my care team before I take this medication? They need to know if you have any of these conditions: cancer heart disease high blood pressure history of blood clots history of stroke low levels of folate, iron, or vitamin B12 in the blood seizures an unusual or allergic reaction to erythropoietin, albumin, benzyl alcohol, hamster proteins, other medicines, foods, dyes, or preservatives pregnant or trying to get pregnant breast-feeding How should I use this  medication? This medicine is for injection into a vein or under the skin. It is usually given by a health care professional in a hospital or clinic setting. If you get this medicine at home, you will be taught how to prepare and give this medicine. Use exactly as directed. Take your medicine at regular intervals. Do not take your medicine more often than directed. It is important that you put your used needles and syringes in a special sharps container. Do not put them in a trash can. If you do not have a sharps container, call your pharmacist or healthcare provider to get one. A special MedGuide will be given to you by the pharmacist with each prescription and refill. Be sure to read this information carefully each time. Talk to your pediatrician regarding the use of this medicine in children. While this drug may be prescribed for selected conditions, precautions do apply. Overdosage: If you think you have taken too much of this medicine contact a poison control center or emergency room at once. NOTE: This medicine is only for you. Do not share  this medicine with others. What if I miss a dose? If you miss a dose, take it as soon as you can. If it is almost time for your next dose, take only that dose. Do not take double or extra doses. What may interact with this medication? Interactions have not been studied. This list may not describe all possible interactions. Give your health care provider a list of all the medicines, herbs, non-prescription drugs, or dietary supplements you use. Also tell them if you smoke, drink alcohol, or use illegal drugs. Some items may interact with your medicine. What should I watch for while using this medication? Your condition will be monitored carefully while you are receiving this medicine. You may need blood work done while you are taking this medicine. This medicine may cause a decrease in vitamin B6. You should make sure that you get enough vitamin B6 while you  are taking this medicine. Discuss the foods you eat and the vitamins you take with your health care professional. What side effects may I notice from receiving this medication? Side effects that you should report to your doctor or health care professional as soon as possible: allergic reactions like skin rash, itching or hives, swelling of the face, lips, or tongue seizures signs and symptoms of a blood clot such as breathing problems; changes in vision; chest pain; severe, sudden headache; pain, swelling, warmth in the leg; trouble speaking; sudden numbness or weakness of the face, arm or leg signs and symptoms of a stroke like changes in vision; confusion; trouble speaking or understanding; severe headaches; sudden numbness or weakness of the face, arm or leg; trouble walking; dizziness; loss of balance or coordination Side effects that usually do not require medical attention (report to your doctor or health care professional if they continue or are bothersome): chills cough dizziness fever headaches joint pain muscle cramps muscle pain nausea, vomiting pain, redness, or irritation at site where injected This list may not describe all possible side effects. Call your doctor for medical advice about side effects. You may report side effects to FDA at 1-800-FDA-1088. Where should I keep my medication? Keep out of the reach of children. Store in a refrigerator between 2 and 8 degrees C (36 and 46 degrees F). Do not freeze or shake. Throw away any unused portion if using a single-dose vial. Multi-dose vials can be kept in the refrigerator for up to 21 days after the initial dose. Throw away unused medicine. NOTE: This sheet is a summary. It may not cover all possible information. If you have questions about this medicine, talk to your doctor, pharmacist, or health care provider.  2022 Elsevier/Gold Standard (2017-07-22 00:00:00)

## 2022-01-02 DIAGNOSIS — I129 Hypertensive chronic kidney disease with stage 1 through stage 4 chronic kidney disease, or unspecified chronic kidney disease: Secondary | ICD-10-CM | POA: Diagnosis not present

## 2022-01-02 DIAGNOSIS — M109 Gout, unspecified: Secondary | ICD-10-CM | POA: Diagnosis not present

## 2022-01-02 DIAGNOSIS — I1310 Hypertensive heart and chronic kidney disease without heart failure, with stage 1 through stage 4 chronic kidney disease, or unspecified chronic kidney disease: Secondary | ICD-10-CM | POA: Diagnosis not present

## 2022-01-02 DIAGNOSIS — R7303 Prediabetes: Secondary | ICD-10-CM | POA: Diagnosis not present

## 2022-01-04 ENCOUNTER — Encounter: Payer: Self-pay | Admitting: Oncology

## 2022-01-04 DIAGNOSIS — E785 Hyperlipidemia, unspecified: Secondary | ICD-10-CM | POA: Diagnosis not present

## 2022-01-04 DIAGNOSIS — R7303 Prediabetes: Secondary | ICD-10-CM | POA: Diagnosis not present

## 2022-01-12 ENCOUNTER — Other Ambulatory Visit: Payer: Self-pay

## 2022-01-12 ENCOUNTER — Emergency Department (HOSPITAL_COMMUNITY): Payer: Medicare Other

## 2022-01-12 ENCOUNTER — Inpatient Hospital Stay (HOSPITAL_COMMUNITY): Payer: Medicare Other

## 2022-01-12 ENCOUNTER — Inpatient Hospital Stay (HOSPITAL_COMMUNITY)
Admission: EM | Admit: 2022-01-12 | Discharge: 2022-01-16 | DRG: 193 | Disposition: A | Discharge status: Home / Self Care | Payer: Medicare Other | Attending: Internal Medicine | Admitting: Internal Medicine

## 2022-01-12 DIAGNOSIS — I619 Nontraumatic intracerebral hemorrhage, unspecified: Secondary | ICD-10-CM | POA: Diagnosis not present

## 2022-01-12 DIAGNOSIS — Z515 Encounter for palliative care: Secondary | ICD-10-CM

## 2022-01-12 DIAGNOSIS — R29701 NIHSS score 1: Secondary | ICD-10-CM | POA: Diagnosis present

## 2022-01-12 DIAGNOSIS — I5022 Chronic systolic (congestive) heart failure: Secondary | ICD-10-CM | POA: Diagnosis present

## 2022-01-12 DIAGNOSIS — I480 Paroxysmal atrial fibrillation: Secondary | ICD-10-CM | POA: Diagnosis present

## 2022-01-12 DIAGNOSIS — Z9981 Dependence on supplemental oxygen: Secondary | ICD-10-CM

## 2022-01-12 DIAGNOSIS — J341 Cyst and mucocele of nose and nasal sinus: Secondary | ICD-10-CM | POA: Diagnosis not present

## 2022-01-12 DIAGNOSIS — Z8249 Family history of ischemic heart disease and other diseases of the circulatory system: Secondary | ICD-10-CM

## 2022-01-12 DIAGNOSIS — M109 Gout, unspecified: Secondary | ICD-10-CM | POA: Diagnosis not present

## 2022-01-12 DIAGNOSIS — Z66 Do not resuscitate: Secondary | ICD-10-CM | POA: Diagnosis not present

## 2022-01-12 DIAGNOSIS — I951 Orthostatic hypotension: Secondary | ICD-10-CM

## 2022-01-12 DIAGNOSIS — R531 Weakness: Secondary | ICD-10-CM | POA: Diagnosis not present

## 2022-01-12 DIAGNOSIS — R64 Cachexia: Secondary | ICD-10-CM | POA: Diagnosis not present

## 2022-01-12 DIAGNOSIS — C7931 Secondary malignant neoplasm of brain: Secondary | ICD-10-CM | POA: Diagnosis not present

## 2022-01-12 DIAGNOSIS — J439 Emphysema, unspecified: Secondary | ICD-10-CM | POA: Diagnosis not present

## 2022-01-12 DIAGNOSIS — G629 Polyneuropathy, unspecified: Secondary | ICD-10-CM | POA: Diagnosis present

## 2022-01-12 DIAGNOSIS — G936 Cerebral edema: Secondary | ICD-10-CM | POA: Diagnosis not present

## 2022-01-12 DIAGNOSIS — Z20822 Contact with and (suspected) exposure to covid-19: Secondary | ICD-10-CM | POA: Diagnosis present

## 2022-01-12 DIAGNOSIS — Z48813 Encounter for surgical aftercare following surgery on the respiratory system: Secondary | ICD-10-CM | POA: Diagnosis not present

## 2022-01-12 DIAGNOSIS — R609 Edema, unspecified: Secondary | ICD-10-CM | POA: Diagnosis not present

## 2022-01-12 DIAGNOSIS — J440 Chronic obstructive pulmonary disease with acute lower respiratory infection: Secondary | ICD-10-CM | POA: Diagnosis not present

## 2022-01-12 DIAGNOSIS — K219 Gastro-esophageal reflux disease without esophagitis: Secondary | ICD-10-CM | POA: Diagnosis present

## 2022-01-12 DIAGNOSIS — R519 Headache, unspecified: Secondary | ICD-10-CM | POA: Diagnosis not present

## 2022-01-12 DIAGNOSIS — E43 Unspecified severe protein-calorie malnutrition: Secondary | ICD-10-CM | POA: Diagnosis present

## 2022-01-12 DIAGNOSIS — Z888 Allergy status to other drugs, medicaments and biological substances status: Secondary | ICD-10-CM | POA: Diagnosis not present

## 2022-01-12 DIAGNOSIS — Z86711 Personal history of pulmonary embolism: Secondary | ICD-10-CM | POA: Diagnosis not present

## 2022-01-12 DIAGNOSIS — J90 Pleural effusion, not elsewhere classified: Secondary | ICD-10-CM

## 2022-01-12 DIAGNOSIS — J479 Bronchiectasis, uncomplicated: Secondary | ICD-10-CM | POA: Diagnosis not present

## 2022-01-12 DIAGNOSIS — G319 Degenerative disease of nervous system, unspecified: Secondary | ICD-10-CM | POA: Diagnosis not present

## 2022-01-12 DIAGNOSIS — R54 Age-related physical debility: Secondary | ICD-10-CM | POA: Diagnosis present

## 2022-01-12 DIAGNOSIS — J9611 Chronic respiratory failure with hypoxia: Secondary | ICD-10-CM | POA: Diagnosis present

## 2022-01-12 DIAGNOSIS — Z681 Body mass index (BMI) 19 or less, adult: Secondary | ICD-10-CM

## 2022-01-12 DIAGNOSIS — I130 Hypertensive heart and chronic kidney disease with heart failure and stage 1 through stage 4 chronic kidney disease, or unspecified chronic kidney disease: Secondary | ICD-10-CM | POA: Diagnosis not present

## 2022-01-12 DIAGNOSIS — I517 Cardiomegaly: Secondary | ICD-10-CM | POA: Diagnosis not present

## 2022-01-12 DIAGNOSIS — J159 Unspecified bacterial pneumonia: Principal | ICD-10-CM | POA: Diagnosis present

## 2022-01-12 DIAGNOSIS — I10 Essential (primary) hypertension: Secondary | ICD-10-CM | POA: Diagnosis not present

## 2022-01-12 DIAGNOSIS — R911 Solitary pulmonary nodule: Secondary | ICD-10-CM | POA: Diagnosis not present

## 2022-01-12 DIAGNOSIS — I700 Atherosclerosis of aorta: Secondary | ICD-10-CM | POA: Diagnosis not present

## 2022-01-12 DIAGNOSIS — J910 Malignant pleural effusion: Secondary | ICD-10-CM | POA: Diagnosis present

## 2022-01-12 DIAGNOSIS — G939 Disorder of brain, unspecified: Secondary | ICD-10-CM | POA: Diagnosis not present

## 2022-01-12 DIAGNOSIS — Z87891 Personal history of nicotine dependence: Secondary | ICD-10-CM

## 2022-01-12 DIAGNOSIS — C7951 Secondary malignant neoplasm of bone: Secondary | ICD-10-CM | POA: Diagnosis present

## 2022-01-12 DIAGNOSIS — C3490 Malignant neoplasm of unspecified part of unspecified bronchus or lung: Secondary | ICD-10-CM | POA: Diagnosis present

## 2022-01-12 DIAGNOSIS — I4891 Unspecified atrial fibrillation: Secondary | ICD-10-CM | POA: Diagnosis not present

## 2022-01-12 DIAGNOSIS — R0602 Shortness of breath: Secondary | ICD-10-CM | POA: Diagnosis not present

## 2022-01-12 DIAGNOSIS — J189 Pneumonia, unspecified organism: Secondary | ICD-10-CM

## 2022-01-12 DIAGNOSIS — N189 Chronic kidney disease, unspecified: Secondary | ICD-10-CM | POA: Diagnosis present

## 2022-01-12 DIAGNOSIS — R918 Other nonspecific abnormal finding of lung field: Secondary | ICD-10-CM | POA: Diagnosis not present

## 2022-01-12 DIAGNOSIS — Z7189 Other specified counseling: Secondary | ICD-10-CM | POA: Diagnosis not present

## 2022-01-12 DIAGNOSIS — R42 Dizziness and giddiness: Secondary | ICD-10-CM | POA: Diagnosis not present

## 2022-01-12 DIAGNOSIS — Z79899 Other long term (current) drug therapy: Secondary | ICD-10-CM

## 2022-01-12 DIAGNOSIS — I639 Cerebral infarction, unspecified: Secondary | ICD-10-CM | POA: Diagnosis not present

## 2022-01-12 DIAGNOSIS — Z9889 Other specified postprocedural states: Secondary | ICD-10-CM

## 2022-01-12 LAB — CBC WITH DIFFERENTIAL/PLATELET
Abs Immature Granulocytes: 0.02 10*3/uL (ref 0.00–0.07)
Basophils Absolute: 0 10*3/uL (ref 0.0–0.1)
Basophils Relative: 1 %
Eosinophils Absolute: 0 10*3/uL (ref 0.0–0.5)
Eosinophils Relative: 1 %
HCT: 36.4 % — ABNORMAL LOW (ref 39.0–52.0)
Hemoglobin: 11 g/dL — ABNORMAL LOW (ref 13.0–17.0)
Immature Granulocytes: 1 %
Lymphocytes Relative: 22 %
Lymphs Abs: 0.8 10*3/uL (ref 0.7–4.0)
MCH: 28.4 pg (ref 26.0–34.0)
MCHC: 30.2 g/dL (ref 30.0–36.0)
MCV: 94.1 fL (ref 80.0–100.0)
Monocytes Absolute: 0.3 10*3/uL (ref 0.1–1.0)
Monocytes Relative: 8 %
Neutro Abs: 2.5 10*3/uL (ref 1.7–7.7)
Neutrophils Relative %: 67 %
Platelets: 177 10*3/uL (ref 150–400)
RBC: 3.87 MIL/uL — ABNORMAL LOW (ref 4.22–5.81)
RDW: 14.9 % (ref 11.5–15.5)
WBC: 3.8 10*3/uL — ABNORMAL LOW (ref 4.0–10.5)
nRBC: 0 % (ref 0.0–0.2)

## 2022-01-12 LAB — COMPREHENSIVE METABOLIC PANEL
ALT: 7 U/L (ref 0–44)
AST: 11 U/L — ABNORMAL LOW (ref 15–41)
Albumin: 2.9 g/dL — ABNORMAL LOW (ref 3.5–5.0)
Alkaline Phosphatase: 98 U/L (ref 38–126)
Anion gap: 7 (ref 5–15)
BUN: 9 mg/dL (ref 8–23)
CO2: 23 mmol/L (ref 22–32)
Calcium: 8.2 mg/dL — ABNORMAL LOW (ref 8.9–10.3)
Chloride: 107 mmol/L (ref 98–111)
Creatinine, Ser: 0.94 mg/dL (ref 0.61–1.24)
GFR, Estimated: >60 mL/min (ref >60)
Glucose, Bld: 91 mg/dL (ref 70–99)
Potassium: 3.7 mmol/L (ref 3.5–5.1)
Sodium: 137 mmol/L (ref 135–145)
Total Bilirubin: 0.7 mg/dL (ref 0.3–1.2)
Total Protein: 6.6 g/dL (ref 6.5–8.1)

## 2022-01-12 LAB — URINALYSIS, ROUTINE W REFLEX MICROSCOPIC
Bilirubin Urine: NEGATIVE
Glucose, UA: NEGATIVE mg/dL
Hgb urine dipstick: NEGATIVE
Ketones, ur: NEGATIVE mg/dL
Leukocytes,Ua: NEGATIVE
Nitrite: NEGATIVE
Protein, ur: NEGATIVE mg/dL
Specific Gravity, Urine: 1.008 (ref 1.005–1.030)
pH: 7 (ref 5.0–8.0)

## 2022-01-12 LAB — RESP PANEL BY RT-PCR (FLU A&B, COVID) ARPGX2
Influenza A by PCR: NEGATIVE
Influenza B by PCR: NEGATIVE
SARS Coronavirus 2 by RT PCR: NEGATIVE

## 2022-01-12 LAB — TROPONIN I (HIGH SENSITIVITY)
Troponin I (High Sensitivity): 10 ng/L (ref <18)
Troponin I (High Sensitivity): 11 ng/L (ref <18)

## 2022-01-12 LAB — VITAMIN B12: Vitamin B-12: 670 pg/mL (ref 180–914)

## 2022-01-12 LAB — LACTIC ACID, PLASMA: Lactic Acid, Venous: 1 mmol/L (ref 0.5–1.9)

## 2022-01-12 LAB — STREP PNEUMONIAE URINARY ANTIGEN: Strep Pneumo Urinary Antigen: NEGATIVE

## 2022-01-12 IMAGING — CT CT CHEST W/O CM
2 of 4 series · 14 of 36 positions shown, 17 images · non-contrast
Comparison: [DATE].

CLINICAL DATA: An 87-year-old male presents for evaluation of
pneumonia.



[Series 3: chest w/o 2mm st · axial · non-contrast · 0.67mm/px · z∈[-767,-453]mm · 11 of 185 slices shown, 14 images]
[im 14/185  mediastinal]
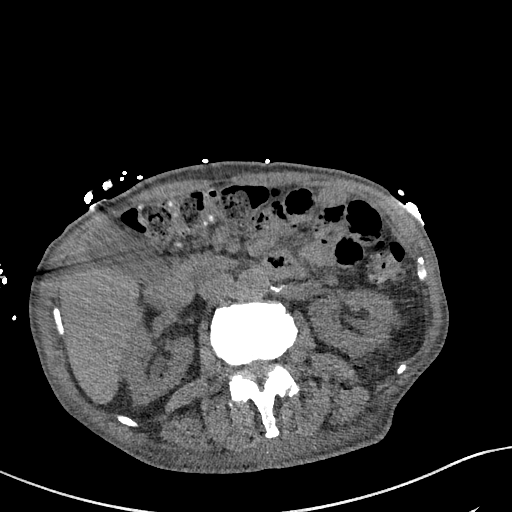
[im 14/185  lung]
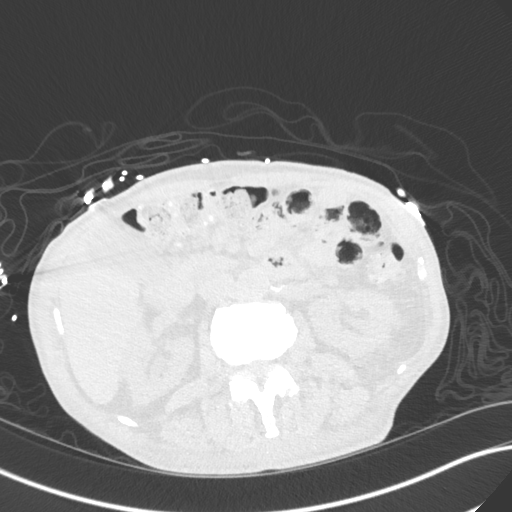
[im 27/185  lung]
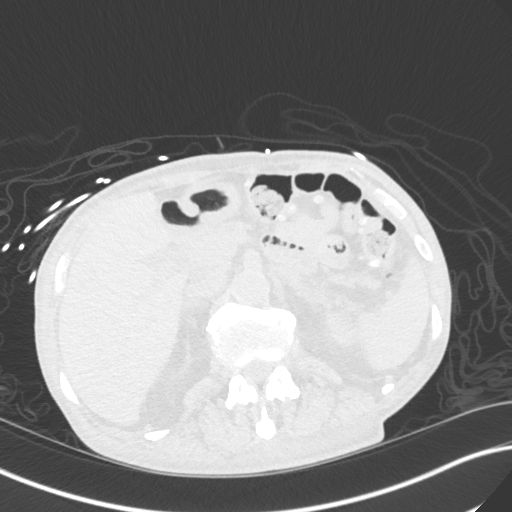
[im 40/185  lung]
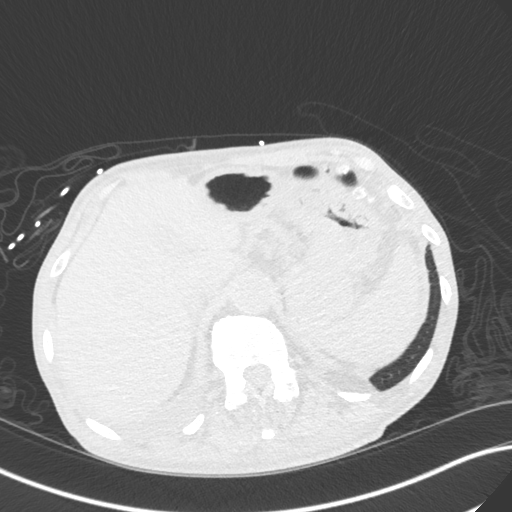
[im 66/185  lung]
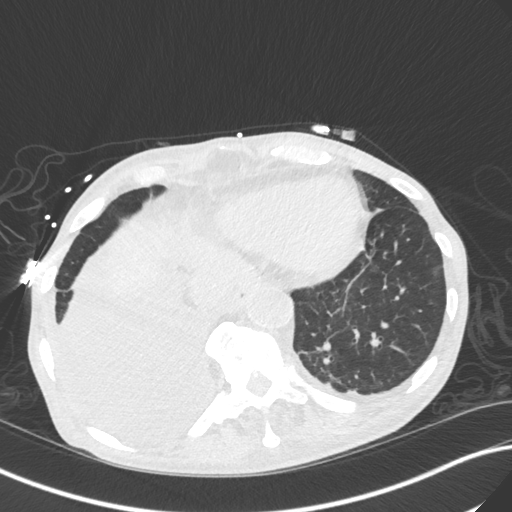
[im 79/185  mediastinal]
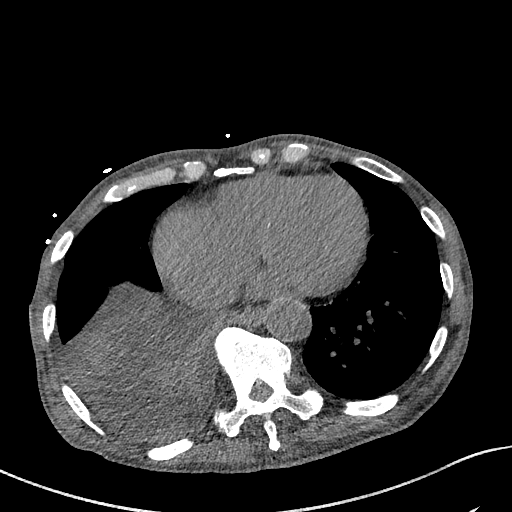
[im 79/185  lung]
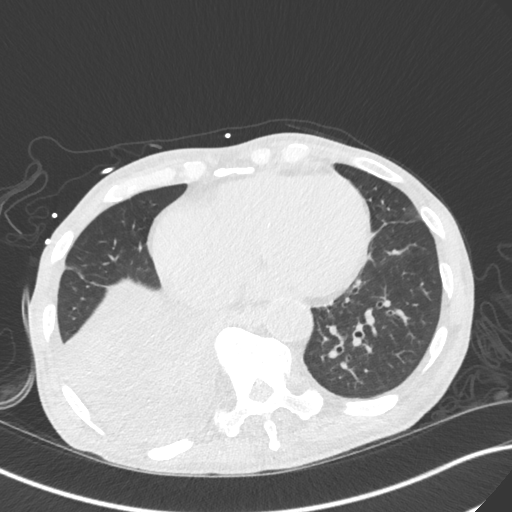
[im 93/185  lung]
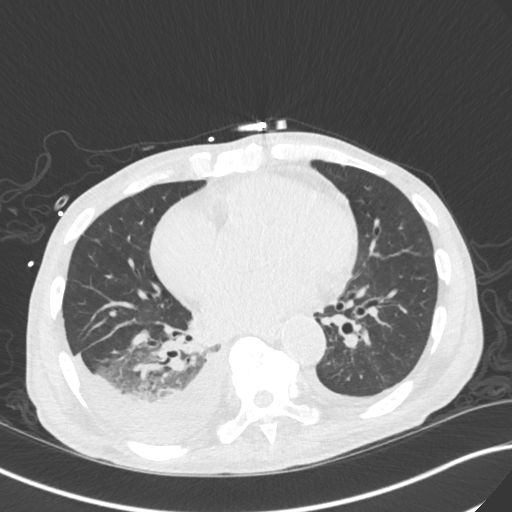
[im 106/185  lung]
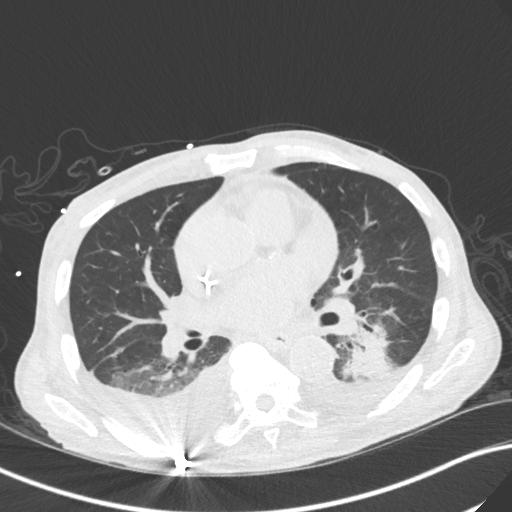
[im 119/185  lung]
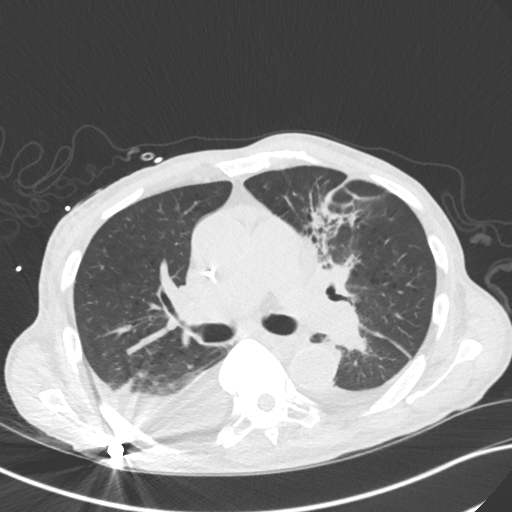
[im 145/185  mediastinal]
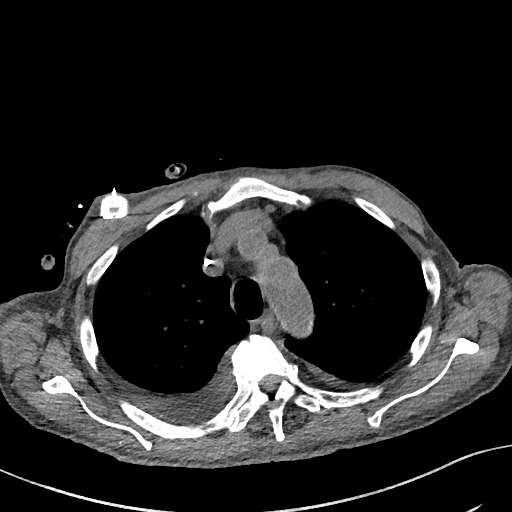
[im 145/185  lung]
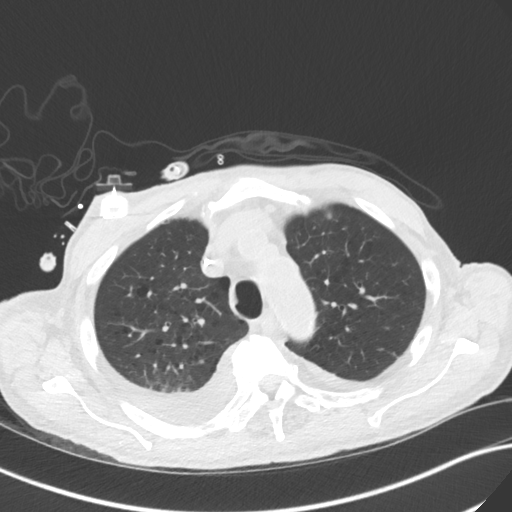
[im 158/185  lung]
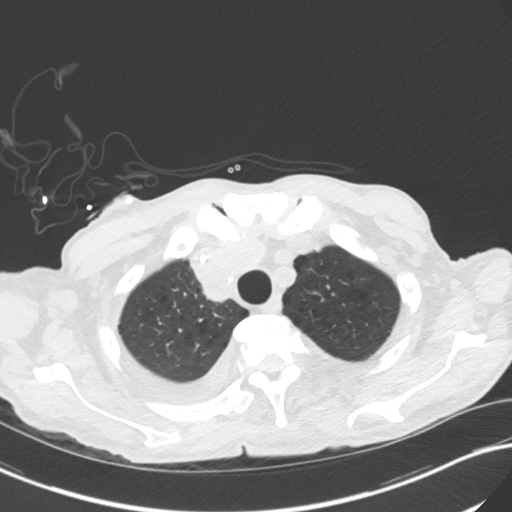
[im 171/185  lung]
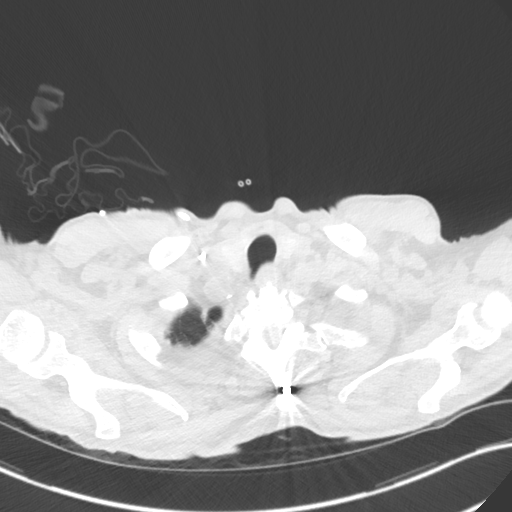

[Series 5: chest w/o 2mm st cor · coronal · non-contrast · 0.68mm/px · 3 of 116 slices shown]
[im 24/116  lung]
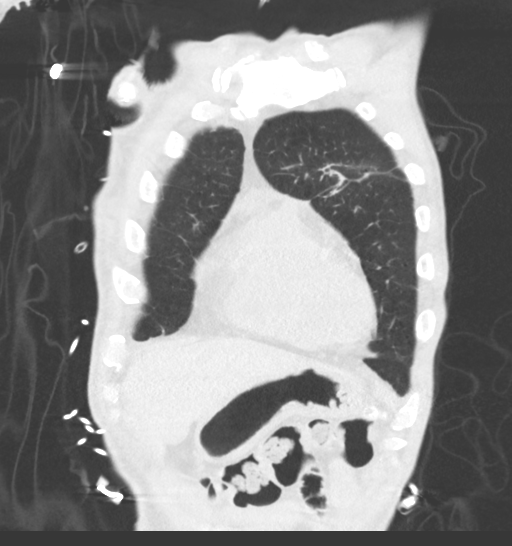
[im 47/116  lung]
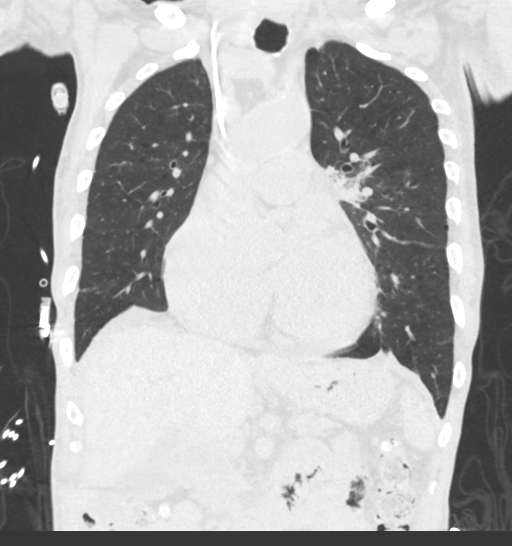
[im 70/116  lung]
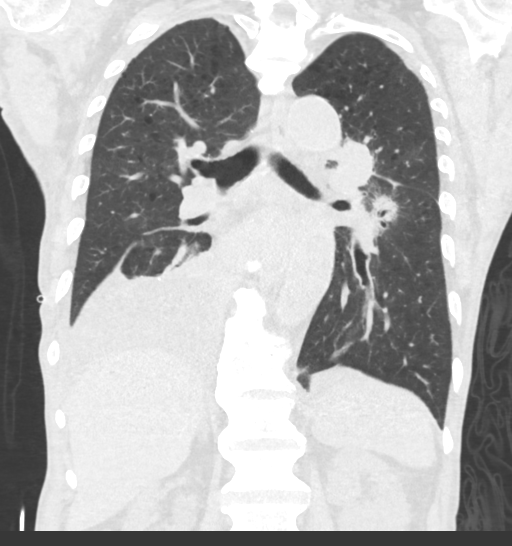

[14 of 36 positions shown; findings below may reference images not displayed]

FINDINGS: Cardiovascular: Calcified atheromatous plaque of the thoracic aorta.
Normal heart size without substantial pericardial effusion. The
central venous access device, Port-A-Cath entering via RIGHT-sided
approach terminating at the caval to atrial junction. Normal caliber
of central pulmonary vasculature. Limited assessment of
cardiovascular structures given lack of intravenous contrast.

Mediastinum/Nodes: No thoracic inlet, axillary or mediastinal
adenopathy. Mild fullness about the LEFT hilum measuring 2.0 cm
previously 3.2 cm at the site of bulky hilar adenopathy anterior to
hilar structures (image 70/3)

Posterior to LEFT hilum on image 63/3 is an area measuring 2.2 cm as
compared to 2.6 cm.

Lungs/Pleura: Signs of pulmonary emphysema. Moderately large
RIGHT-sided effusion predominantly sub pulmonic, also layering
dependently tracking as a smaller portion of this effusion along the
posterior RIGHT chest. Ground-glass attenuation and airspace disease
at the RIGHT lung base new since previous imaging.

New spiculated area in the LEFT lower lobe measuring 4.0 x 3.2 cm.
This does not appear bandlike on the coronal image measuring
approximately 3.0 cm greatest craniocaudal dimension.

New ground-glass nodule in the LEFT upper lobe (image 59/4) 8 mm.

Parenchymal distortion and bronchiectasis along the LEFT anterior
upper lobe at along the LEFT mediastinal border. Small LEFT
effusion.

Upper Abdomen: Imaged portions the liver, gallbladder, pancreas,
spleen and adrenal glands are unremarkable.

Musculoskeletal: No acute bone finding. No destructive bone process.
Spinal degenerative changes.
IMPRESSION: 1. Airspace disease and ground-glass in the RIGHT lower lobe and
associated pleural effusion suspicious current or recent pneumonia
with parapneumonic effusion and associated substantial volume loss
in the RIGHT lung base. Consider short interval follow-up to ensure
resolution in this patient with history of pulmonary neoplasm.
Contrasted imaging on follow-up could also be helpful as warranted
to assess for any signs of pleural nodularity or other features that
would change the differential.
2. New spiculated area inferior to the LEFT hilum separate in the
LEFT lower lobe adjacent to post treatment changes does not display
a bandlike morphology that would be expected for post treatment
changes though is seen in the absence of more recent post treatment
imaging. PET scan may be helpful to determine whether there is
potential disease in this area.
3. Diminished size of LEFT hilar and perihilar masses since previous
imaging with surrounding post treatment changes that extend into the
anterior LEFT upper lobe.
4. Pulmonary emphysema and aortic atherosclerosis.

Aortic Atherosclerosis ([1X]-[1X]) and Emphysema ([1X]-[1X]).
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
## 2022-01-12 MED ORDER — ENSURE ENLIVE PO LIQD
237.0000 mL | Freq: Two times a day (BID) | ORAL | Status: DC
  Administered 2022-01-13 – 2022-01-14 (×3): 237 mL via ORAL
  Filled 2022-01-12: qty 237

## 2022-01-12 MED ORDER — HYDRALAZINE HCL 25 MG PO TABS: 25.0000 mg | ORAL_TABLET | Freq: Four times a day (QID) | ORAL | Status: DC | PRN

## 2022-01-12 MED ORDER — PROCHLORPERAZINE MALEATE 10 MG PO TABS
10.0000 mg | ORAL_TABLET | Freq: Four times a day (QID) | ORAL | Status: DC | PRN
  Filled 2022-01-12: qty 1

## 2022-01-12 MED ORDER — APIXABAN 5 MG PO TABS
5.0000 mg | ORAL_TABLET | Freq: Two times a day (BID) | ORAL | Status: DC
  Administered 2022-01-12 – 2022-01-15 (×7): 5 mg via ORAL
  Filled 2022-01-12 (×7): qty 1

## 2022-01-12 MED ORDER — AZITHROMYCIN 500 MG PO TABS
500.0000 mg | ORAL_TABLET | Freq: Every day | ORAL | Status: DC
  Administered 2022-01-13 – 2022-01-16 (×4): 500 mg via ORAL
  Filled 2022-01-12 (×4): qty 1

## 2022-01-12 MED ORDER — SACUBITRIL-VALSARTAN 24-26 MG PO TABS
1.0000 | ORAL_TABLET | Freq: Two times a day (BID) | ORAL | Status: DC
  Administered 2022-01-12 – 2022-01-14 (×5): 1 via ORAL
  Filled 2022-01-12 (×6): qty 1

## 2022-01-12 MED ORDER — ENOXAPARIN SODIUM 40 MG/0.4ML IJ SOSY: 40.0000 mg | PREFILLED_SYRINGE | INTRAMUSCULAR | Status: DC

## 2022-01-12 MED ORDER — SODIUM CHLORIDE 0.9 % IV SOLN
500.0000 mg | Freq: Once | INTRAVENOUS | Status: AC
  Administered 2022-01-12: 500 mg via INTRAVENOUS
  Filled 2022-01-12: qty 5

## 2022-01-12 MED ORDER — METOPROLOL SUCCINATE ER 25 MG PO TB24
25.0000 mg | ORAL_TABLET | Freq: Every day | ORAL | Status: DC
  Administered 2022-01-13 – 2022-01-16 (×4): 25 mg via ORAL
  Filled 2022-01-12 (×4): qty 1

## 2022-01-12 MED ORDER — ALBUTEROL SULFATE (2.5 MG/3ML) 0.083% IN NEBU
3.0000 mL | INHALATION_SOLUTION | Freq: Four times a day (QID) | RESPIRATORY_TRACT | Status: DC | PRN
  Administered 2022-01-15: 3 mL via RESPIRATORY_TRACT
  Filled 2022-01-12: qty 3

## 2022-01-12 MED ORDER — SODIUM CHLORIDE 0.9 % IV SOLN
2.0000 g | INTRAVENOUS | Status: DC
  Administered 2022-01-13 – 2022-01-15 (×3): 2 g via INTRAVENOUS
  Filled 2022-01-12 (×3): qty 20

## 2022-01-12 MED ORDER — SODIUM CHLORIDE 0.9 % IV SOLN
1.0000 g | Freq: Once | INTRAVENOUS | Status: AC
  Administered 2022-01-12: 1 g via INTRAVENOUS
  Filled 2022-01-12: qty 10

## 2022-01-12 MED ORDER — SODIUM CHLORIDE 0.9 % IV BOLUS
500.0000 mL | Freq: Once | INTRAVENOUS | Status: AC
  Administered 2022-01-12: 500 mL via INTRAVENOUS

## 2022-01-12 MED ORDER — ALLOPURINOL 100 MG PO TABS
100.0000 mg | ORAL_TABLET | Freq: Every day | ORAL | Status: DC
Start: 2022-01-12 — End: 2022-01-16
  Administered 2022-01-12 – 2022-01-16 (×5): 100 mg via ORAL
  Filled 2022-01-12 (×5): qty 1

## 2022-01-12 MED ORDER — ONDANSETRON HCL 4 MG PO TABS: 4.0000 mg | ORAL_TABLET | ORAL | Status: DC | PRN

## 2022-01-12 MED ORDER — GUAIFENESIN ER 600 MG PO TB12
1200.0000 mg | ORAL_TABLET | Freq: Two times a day (BID) | ORAL | Status: DC
  Administered 2022-01-12 – 2022-01-16 (×9): 1200 mg via ORAL
  Filled 2022-01-12 (×9): qty 2

## 2022-01-12 MED ORDER — ACETAMINOPHEN 325 MG PO TABS
650.0000 mg | ORAL_TABLET | Freq: Four times a day (QID) | ORAL | Status: DC | PRN
  Administered 2022-01-14 (×2): 650 mg via ORAL
  Filled 2022-01-12 (×3): qty 2

## 2022-01-12 MED ORDER — OXYCODONE HCL 5 MG PO TABS
10.0000 mg | ORAL_TABLET | ORAL | Status: DC | PRN
  Administered 2022-01-14 (×2): 10 mg via ORAL
  Filled 2022-01-12 (×2): qty 2

## 2022-01-12 MED ORDER — PANTOPRAZOLE SODIUM 40 MG PO TBEC
40.0000 mg | DELAYED_RELEASE_TABLET | Freq: Every day | ORAL | Status: DC
  Administered 2022-01-12 – 2022-01-16 (×5): 40 mg via ORAL
  Filled 2022-01-12 (×5): qty 1

## 2022-01-12 MED ORDER — FOLIC ACID 1 MG PO TABS
1.0000 mg | ORAL_TABLET | Freq: Every day | ORAL | Status: DC
  Administered 2022-01-12 – 2022-01-16 (×5): 1 mg via ORAL
  Filled 2022-01-12 (×5): qty 1

## 2022-01-12 NOTE — Progress Notes (Signed)
CT chest reviewed. RLL PNA and pleural effusion.   Ordered IR thoracentesis. Change anticoagulation to heparin drip for now.

## 2022-01-12 NOTE — ED Notes (Signed)
Transport called for patient

## 2022-01-12 NOTE — ED Triage Notes (Signed)
Pt from home, receiving chemo for bone cancer, for eval of weakness since yesterday. Has dizziness on standing only. +orthostatic changes with EMS. Wears 3L home O2.
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
## 2022-01-13 ENCOUNTER — Inpatient Hospital Stay (HOSPITAL_COMMUNITY): Payer: Medicare Other

## 2022-01-13 ENCOUNTER — Encounter (HOSPITAL_COMMUNITY): Payer: Self-pay | Admitting: Internal Medicine

## 2022-01-13 DIAGNOSIS — Z7189 Other specified counseling: Secondary | ICD-10-CM

## 2022-01-13 DIAGNOSIS — Z66 Do not resuscitate: Secondary | ICD-10-CM

## 2022-01-13 DIAGNOSIS — Z515 Encounter for palliative care: Secondary | ICD-10-CM

## 2022-01-13 DIAGNOSIS — J90 Pleural effusion, not elsewhere classified: Secondary | ICD-10-CM

## 2022-01-13 DIAGNOSIS — C3490 Malignant neoplasm of unspecified part of unspecified bronchus or lung: Secondary | ICD-10-CM

## 2022-01-13 LAB — ALBUMIN, PLEURAL OR PERITONEAL FLUID: Albumin, Fluid: 2 g/dL

## 2022-01-13 LAB — BODY FLUID CELL COUNT WITH DIFFERENTIAL
Eos, Fluid: 0 %
Lymphs, Fluid: 60 %
Monocyte-Macrophage-Serous Fluid: 35 % — ABNORMAL LOW (ref 50–90)
Neutrophil Count, Fluid: 5 % (ref 0–25)
Total Nucleated Cell Count, Fluid: 527 cu mm (ref 0–1000)

## 2022-01-13 LAB — GLUCOSE, PLEURAL OR PERITONEAL FLUID: Glucose, Fluid: 97 mg/dL

## 2022-01-13 LAB — GRAM STAIN

## 2022-01-13 LAB — BRAIN NATRIURETIC PEPTIDE: B Natriuretic Peptide: 760.8 pg/mL — ABNORMAL HIGH (ref 0.0–100.0)

## 2022-01-13 LAB — MRSA NEXT GEN BY PCR, NASAL: MRSA by PCR Next Gen: NOT DETECTED

## 2022-01-13 LAB — LACTATE DEHYDROGENASE: LDH: 120 U/L (ref 98–192)
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
## 2022-01-13 IMAGING — US US THORACENTESIS ASP PLEURAL SPACE W/IMG GUIDE
1 series · 8 of 8 positions shown · non-contrast
Comparison: none

INDICATION: Pleural effusion, pneumonia, shortness of breath, cancer history

[Series 1: us thoracentesis asp pleural space w/img guide · 8 of 8 slices shown]
[im 1/8]
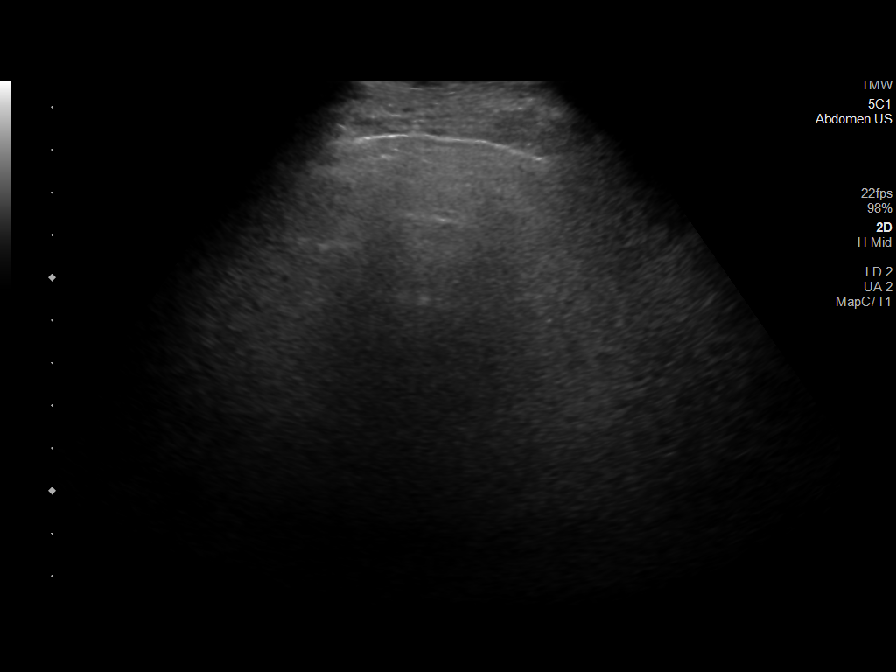
[im 2/8]
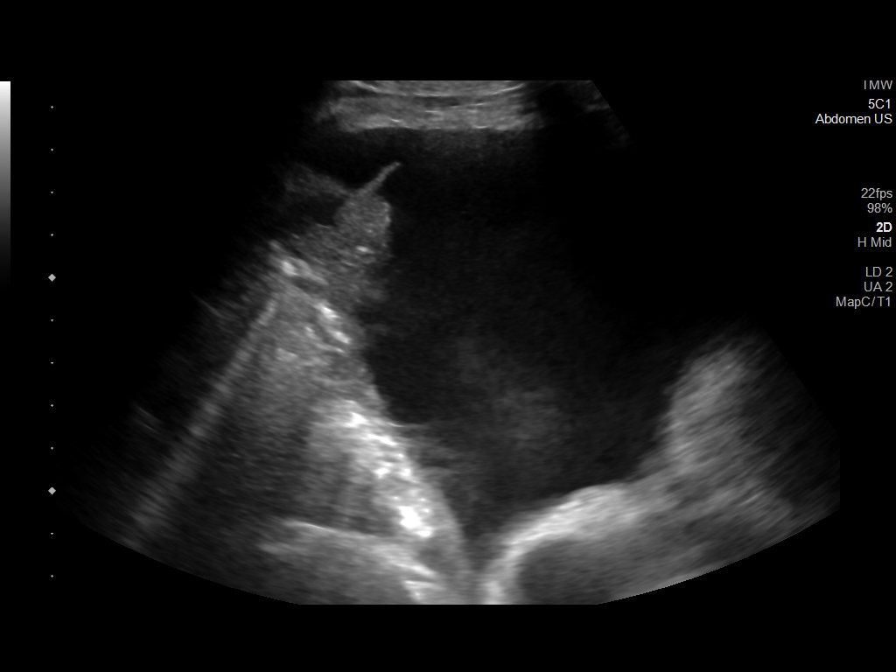
[im 3/8]
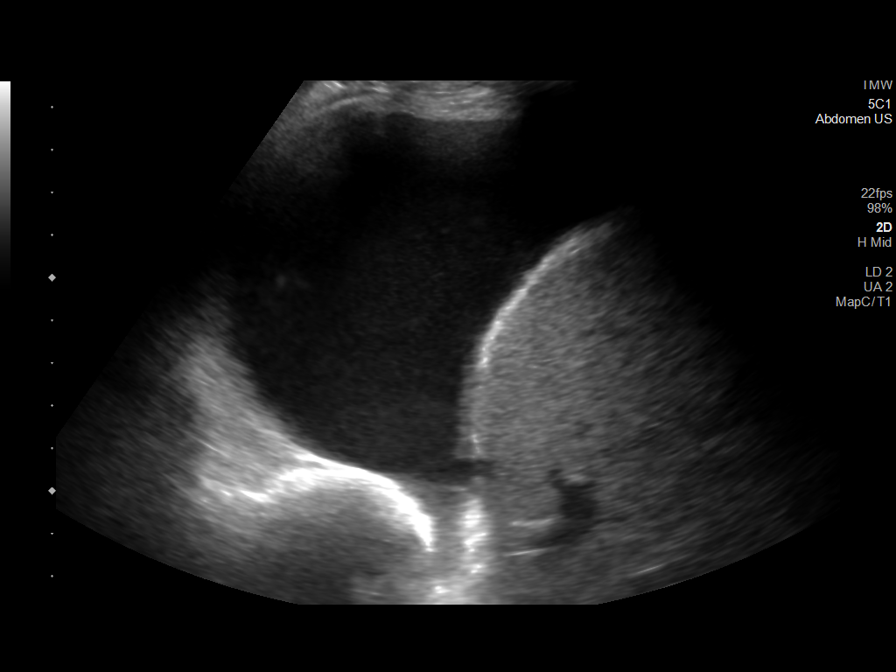
[im 4/8]
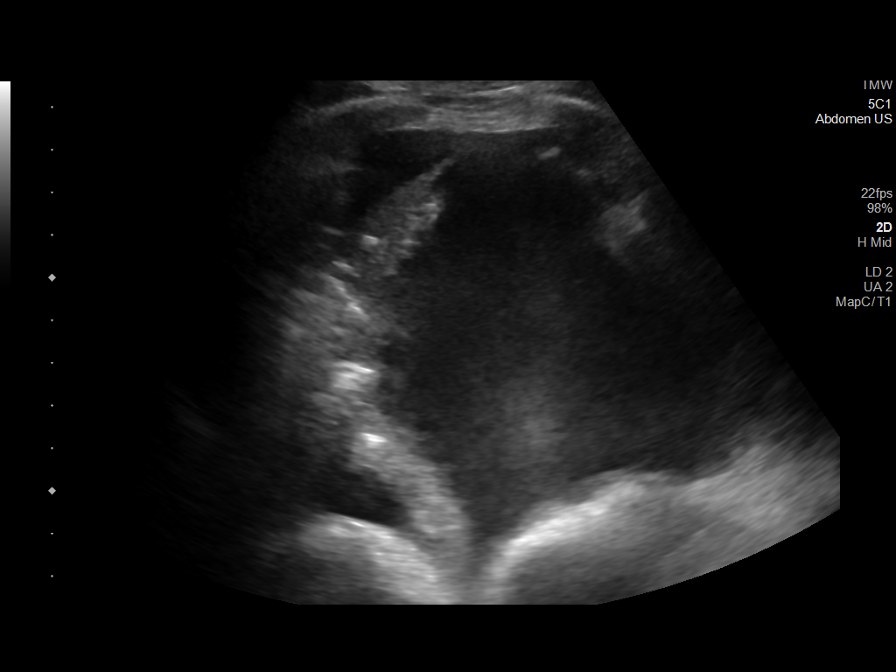
[im 5/8]
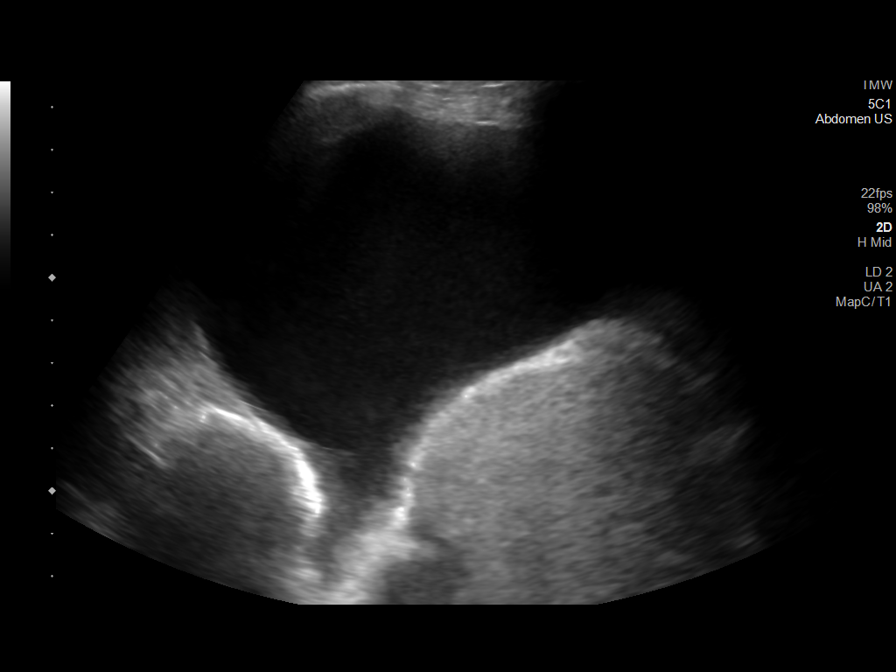
[im 6/8]
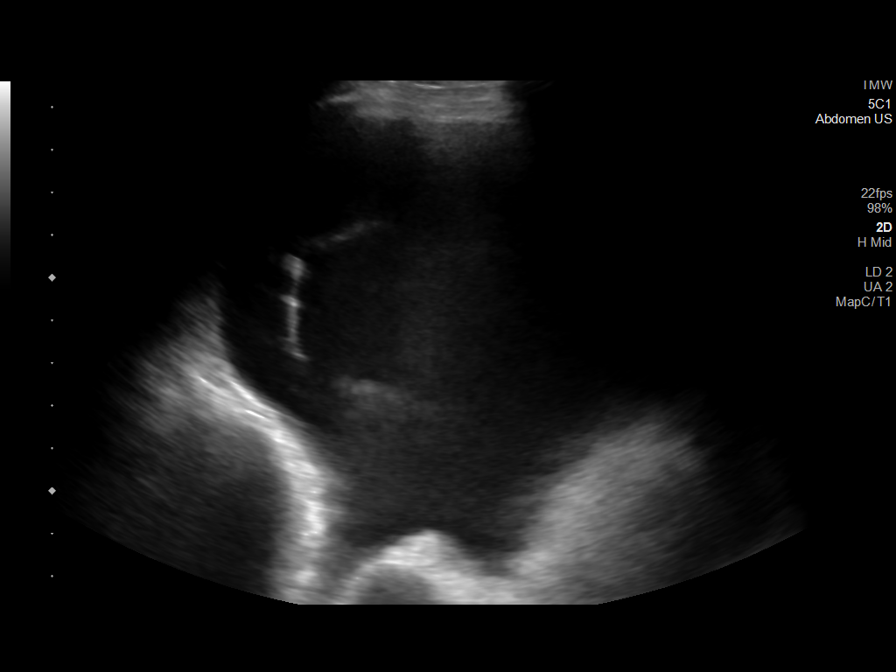
[im 7/8]
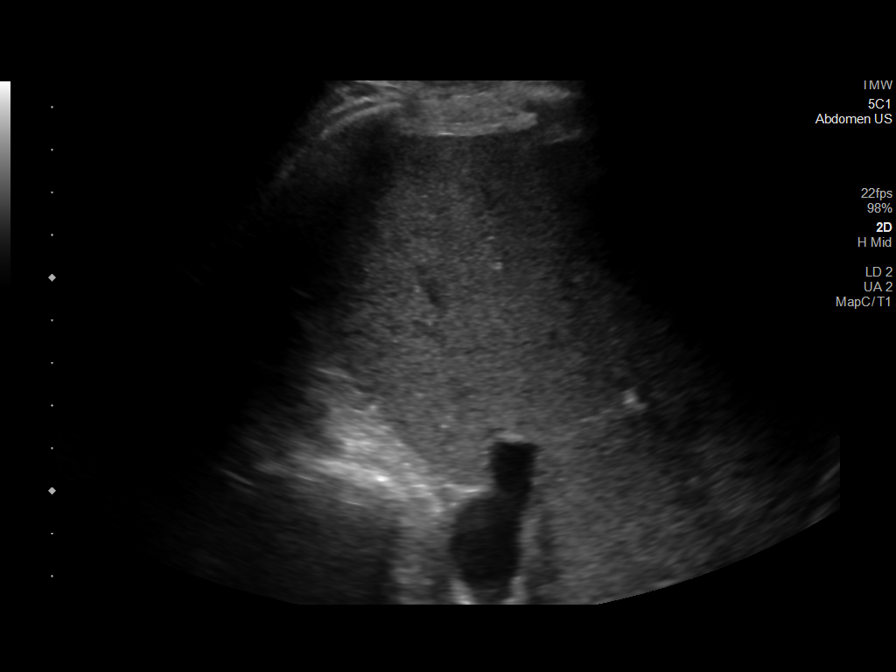
[im 8/8]
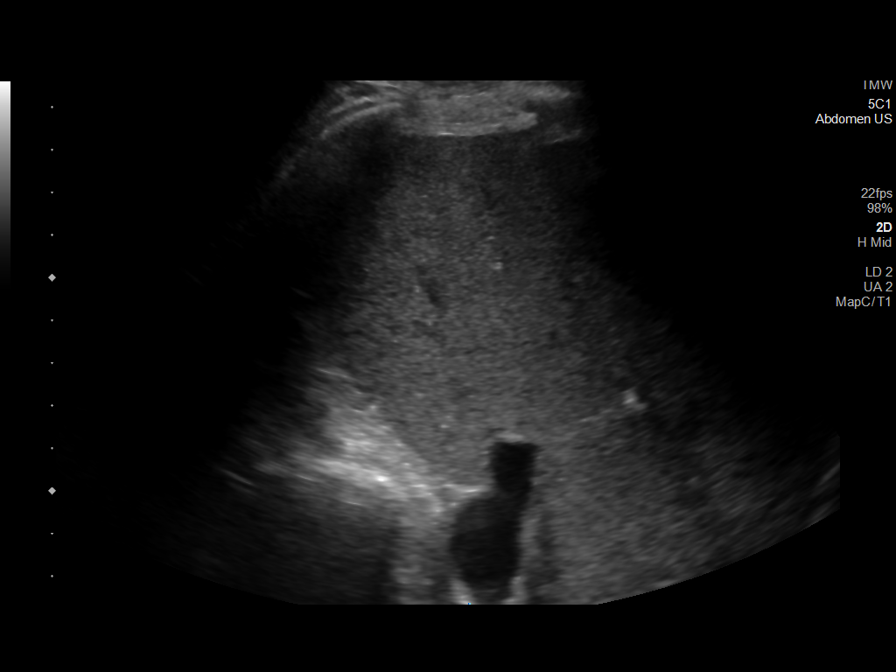

[8 of 8 positions shown; findings below may reference images not displayed]

EXAM:
ULTRASOUND GUIDED RIGHT THORACENTESIS

MEDICATIONS:
None.

COMPLICATIONS:
None immediate.

PROCEDURE:
An ultrasound guided thoracentesis was thoroughly discussed with the
patient and questions answered. The benefits, risks, alternatives
and complications were also discussed. The patient understands and
wishes to proceed with the procedure. Written consent was obtained.

Ultrasound was performed to localize and mark an adequate pocket of
fluid in the right chest. The area was then prepped and draped in
the normal sterile fashion. 1% Lidocaine was used for local
anesthesia. Under ultrasound guidance a 6 Fr Safe-T-Centesis
catheter was introduced. Thoracentesis was performed. The catheter
was removed and a dressing applied.
FINDINGS: A total of approximately [I9] of yellow pleural fluid was removed.
Samples were sent to the laboratory as requested by the clinical
team.
IMPRESSION: Successful ultrasound guided right thoracentesis yielding [I9] of
pleural fluid.

Performed and dictated by BUKHARI

## 2022-01-13 MED ORDER — CHLORHEXIDINE GLUCONATE CLOTH 2 % EX PADS
6.0000 | MEDICATED_PAD | Freq: Every day | CUTANEOUS | Status: DC
  Administered 2022-01-15: 6 via TOPICAL

## 2022-01-13 MED ORDER — BOOST PLUS PO LIQD
237.0000 mL | Freq: Three times a day (TID) | ORAL | Status: DC
  Administered 2022-01-13 – 2022-01-14 (×3): 237 mL via ORAL
  Filled 2022-01-13 (×6): qty 237

## 2022-01-13 MED ORDER — LIDOCAINE HCL (PF) 1 % IJ SOLN
INTRAMUSCULAR | Status: AC
  Filled 2022-01-13: qty 30

## 2022-01-13 MED ORDER — SODIUM CHLORIDE 0.9% FLUSH: 10.0000 mL | INTRAVENOUS | Status: DC | PRN

## 2022-01-13 MED ORDER — SODIUM CHLORIDE 0.9% FLUSH
10.0000 mL | Freq: Two times a day (BID) | INTRAVENOUS | Status: DC
  Administered 2022-01-13 – 2022-01-14 (×3): 10 mL

## 2022-01-13 NOTE — Plan of Care (Signed)
  Problem: Activity: Goal: Ability to tolerate increased activity will improve Outcome: Progressing   Problem: Clinical Measurements: Goal: Ability to maintain a body temperature in the normal range will improve Outcome: Progressing   Problem: Respiratory: Goal: Ability to maintain adequate ventilation will improve Outcome: Progressing   

## 2022-01-13 NOTE — Evaluation (Signed)
Clinical/Bedside Swallow Evaluation Patient Details  Name: Robert Hamilton MRN: [id number] Date of Birth: December 27, 1933  Today's Date: 01/13/2022 Time: SLP Start Time (ACUTE ONLY): 64 SLP Stop Time (ACUTE ONLY): 2505 SLP Time Calculation (min) (ACUTE ONLY): 6 min  Past Medical History:  Past Medical History:  Diagnosis Date   Anemia    Anemia 09/27/2021   Cancer ([place])    CHF (congestive heart failure) ([facility])    Chronic kidney disease    COPD (chronic obstructive pulmonary disease) ([place])    Diverticulosis    GERD (gastroesophageal reflux disease)    Hypertension    Past Surgical History:  Past Surgical History:  Procedure Laterality Date   CRYOTHERAPY  06/26/2021   Procedure: CRYOTHERAPY;  Surgeon: Garner Nash, DO;  Location: [facility];  Service: Pulmonary;;   HEMOSTASIS CONTROL  06/26/2021   Procedure: HEMOSTASIS CONTROL;  Surgeon: Garner Nash, DO;  Location: [facility];  Service: Pulmonary;;   HERNIA REPAIR     PROSTATE SURGERY     PROSTATECTOMY     VIDEO BRONCHOSCOPY Left 06/26/2021   Procedure: VIDEO BRONCHOSCOPY WITHOUT FLUORO;  Surgeon: Garner Nash, DO;  Location: [facility];  Service: Pulmonary;  Laterality: Left;  possible cryotherapy   HPI:  88 y.o. male came with increasing fatigue, generalized weakness and near syncope.  Work-up in the ER suggestive of possible pneumonia with parapneumonic effusion and he was admitted for further care.  Pt now s/p thoracentesis with most recent CXR 2/12: "No residual pleural fluid on the right. No complicating pneumothorax or re-expansion edema. Left perihilar opacity, known".  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    Assessment / Plan  / Recommendation  Clinical Impression  Pt presents with functional swallowing as assessed clinically.  Pt tolerated all consistencies trialed, including serial straw sips of thin liquid, with no clinical s/s of aspiration and exhibited good oral clearance of solids and adequate mastication despite partial edentulism.    Recommend continuing regular texture diet with thin liquid.    If there is specific concern for silent aspiration, please place order for MBSS.  SLP will sign off at this time.  Pt has no further ST needs.   SLP Visit Diagnosis: Dysphagia, unspecified (R13.10)    Aspiration Risk  No limitations    Diet Recommendation Regular;Thin liquid   Liquid Administration via: Cup;Straw Medication Administration: Whole meds with liquid Supervision: Patient able to self feed Compensations: Slow rate;Small sips/bites Postural Changes: Seated upright at 90 degrees    Other  Recommendations Oral Care Recommendations: Oral care BID    Recommendations for follow up therapy are one component of a multi-disciplinary discharge planning process, led by the attending physician.  Recommendations may be updated based on patient status, additional functional criteria and insurance authorization.  Follow up Recommendations No SLP follow up      Assistance Recommended at Discharge None  Functional Status Assessment Patient has had a recent decline in their functional status and/or demonstrates limited ability to make significant improvements in function in a reasonable and predictable amount of time  Frequency and Duration  (N/A)          Prognosis Prognosis for Safe Diet Advancement:  (N/A)  Swallow Study   General HPI: 88 y.o. male came with increasing fatigue, generalized weakness and near syncope.  Work-up in the ER suggestive of possible pneumonia with parapneumonic effusion and he was admitted for further care.  Pt now s/p thoracentesis with most recent CXR 2/12: "No residual  pleural fluid on the right. No complicating pneumothorax or re-expansion edema. Left perihilar opacity, known".  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 Type of Study: Bedside Swallow Evaluation Diet Prior to this Study: Regular;Thin liquids Temperature Spikes Noted: No Respiratory Status: Room air History of Recent Intubation: No Behavior/Cognition: Alert;Cooperative;Pleasant mood Oral Cavity Assessment: Within Functional Limits Oral Care Completed by SLP: No Oral Cavity - Dentition: Adequate natural dentition;Edentulous (bottom arch) Vision: Functional for self-feeding Self-Feeding Abilities: Able to feed self Patient Positioning: Upright in bed Baseline Vocal Quality: Normal Volitional Cough: Strong Volitional Swallow: Able to elicit    Oral/Motor/Sensory Function Overall Oral Motor/Sensory Function: Within functional limits Facial ROM: Within Functional Limits Facial Symmetry: Within Functional Limits Lingual ROM: Within Functional Limits Lingual Symmetry: Within Functional Limits Lingual Strength: Reduced Velum: Within Functional Limits Mandible: Within Functional Limits   Ice Chips Ice chips: Not tested   Thin Liquid Thin Liquid: Within functional limits Presentation: Straw    Nectar Thick Nectar Thick Liquid: Not tested   Honey Thick Honey Thick Liquid: Not tested   Puree Puree: Within functional limits Presentation: Spoon   Solid     Solid: Within functional limits Presentation: [place], [place], [place] Office: [phone]  01/13/2022,3:21 PM

## 2022-01-13 NOTE — Progress Notes (Signed)
PT Cancellation Note  Patient Details Name: Robert Hamilton MRN: [id number] DOB: Nov 12, 1934   Cancelled Treatment:    Reason Eval/Treat Not Completed: Other (comment) Pt pending imminent thoracentesis. Will follow up.  Wyona Almas, PT, DPT Acute Rehabilitation Services Pager [phone] Office [phone]    Deno Etienne 01/13/2022, 8:58 AM

## 2022-01-13 NOTE — Plan of Care (Signed)
  Problem: Activity: Goal: Ability to tolerate increased activity will improve Outcome: Progressing   Problem: Clinical Measurements: Goal: Ability to maintain a body temperature in the normal range will improve Outcome: Progressing   Problem: Respiratory: Goal: Ability to maintain adequate ventilation will improve Outcome: Progressing Goal: Ability to maintain a clear airway will improve Outcome: Progressing   

## 2022-01-13 NOTE — Evaluation (Signed)
Physical Therapy Evaluation Patient Details Name: Robert Hamilton MRN: [id number] DOB: [birth year] Today's Date: 01/13/2022  History of Present Illness  Pt is a 88 y.o. M who presents 01/12/2022 with increasing fatigue, generalized weakness, and near syncope. Work up suggestive of possible PNA with parapneumonic effusion. 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 Pt denies falls in past 3 months. Pt is likely fairly close to his functional baseline. Orthostatics with mild diastolic drop from sitting to standing, but pt asymptomatic. Pt ambulating 200 feet with a walker at a supervision level, SpO2 > 90% on 3L O2, HR 92 afib. Will continue to follow acutely to promote mobility, but do not anticipate need for PT follow up.  Orthostatics: Supine: 154/109 (124) Sitting: 146/105 (113) Standing: 142/93 (107)     Recommendations for follow up therapy are one component of a multi-disciplinary discharge planning process, led by the attending physician.  Recommendations may be updated based on patient status, additional functional criteria and insurance authorization.  Follow Up Recommendations No PT follow up    Assistance Recommended at Discharge PRN  Patient can return home with the following  Direct supervision/assist for medications management;Assistance with cooking/housework;Assist for transportation    Equipment Recommendations None recommended by PT  Recommendations for Other Services       Functional Status Assessment Patient has had a recent decline in their functional status and demonstrates the ability to make  significant improvements in function in a reasonable and predictable amount of time.     Precautions / Restrictions Precautions Precautions: Fall Restrictions Weight Bearing Restrictions: No      Mobility  Bed Mobility Overal bed mobility: Modified Independent                  Transfers Overall transfer level: Needs assistance Equipment used: Rolling walker (2 wheels) Transfers: Sit to/from Stand Sit to Stand: Supervision                Ambulation/Gait Ambulation/Gait assistance: Supervision Gait Distance (Feet): 200 Feet Assistive device: Rolling walker (2 wheels) Gait Pattern/deviations: Step-through pattern, Decreased stride length Gait velocity: decreased     General Gait Details: Higher cadence, no gross imbalance noted, supervision for safety  Stairs            Wheelchair Mobility    Modified Rankin (Stroke Patients Only)       Balance Overall balance assessment: Mild deficits observed, not formally tested                                           Pertinent Vitals/Pain Pain Assessment Pain Assessment: No/denies pain    Home Living Family/patient expects to be discharged to:: Private residence Living Arrangements: Alone Available Help at Discharge: Family;Available PRN/intermittently Type of Home: Apartment Home Access: Level entry       Home Layout: One level Home Equipment: Conservation officer, nature (2 wheels);Cane - single point;Shower seat Additional Comments: daughter lives within an hour    Prior Function Prior Level of Function : Needs assist  Mobility Comments: uses walker ADLs Comments: requires assist for IADL's from daughter i.e. medication management, meals     Hand Dominance   Dominant Hand: Right    Extremity/Trunk Assessment   Upper Extremity Assessment Upper Extremity Assessment: Defer to OT evaluation    Lower Extremity Assessment Lower Extremity Assessment: Overall WFL for  tasks assessed       Communication   Communication: No difficulties  Cognition Arousal/Alertness: Awake/alert Behavior During Therapy: WFL for tasks assessed/performed Overall Cognitive Status: No family/caregiver present to determine baseline cognitive functioning                                 General Comments: Pt able to correctly state month with increased time, requires repetition for some questions        General Comments      Exercises     Assessment/Plan    PT Assessment Patient needs continued PT services  PT Problem List Decreased strength;Decreased balance;Decreased activity tolerance;Decreased mobility;Cardiopulmonary status limiting activity       PT Treatment Interventions DME instruction;Gait training;Functional mobility training;Therapeutic activities;Therapeutic exercise;Balance training;Patient/family education    PT Goals (Current goals can be found in the Care Plan section)  Acute Rehab PT Goals Patient Stated Goal: get stronger PT Goal Formulation: With patient Time For Goal Achievement: 01/27/22 Potential to Achieve Goals: Good    Frequency Min 3X/week     Co-evaluation               AM-PAC PT "6 Clicks" Mobility  Outcome Measure Help needed turning from your back to your side while in a flat bed without using bedrails?: None Help needed moving from lying on your back to sitting on the side of a flat bed without using bedrails?: A Little Help needed moving to and from a bed to a chair (including a wheelchair)?: A Little Help needed standing up from a chair using your arms (e.g., wheelchair or bedside chair)?: A Little Help needed to walk in hospital room?: A Little Help needed climbing 3-5 steps with a railing? : A Little 6 Click Score: 19    End of Session Equipment Utilized During Treatment: Gait belt;Oxygen Activity Tolerance: Patient tolerated treatment well Patient left: with call bell/phone within reach;in  chair;with chair alarm set Nurse Communication: Mobility status PT Visit Diagnosis: Unsteadiness on feet (R26.81)    Time: 1048-1100 PT Time Calculation (min) (ACUTE ONLY): 12 min   Charges:   PT Evaluation $PT Eval Moderate Complexity: 1 Mod          Wyona Almas, PT, DPT Acute Rehabilitation Services Pager [phone] Office [phone]   Deno Etienne 01/13/2022, 11:26 AM
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
## 2022-01-13 NOTE — TOC Initial Note (Signed)
Transition of Care [facility]) - Initial/Assessment Note    Patient Details  Name: Robert Hamilton MRN: [id number] Date of Birth: 1934/06/25  Transition of [facility]) CM/SW Contact:    Verdell Carmine, RN Phone Number: 01/13/2022, 12:43 PM  Clinical Narrative:                 The Transition of Care Department [facility]) has reviewed patient and no TOC needs have been identified at this time. We will continue to monitor patient advancement through interdisciplinary progression rounds. If new patient transition needs arise, please place a TOC consult         Patient Goals and CMS Choice        Expected Discharge Plan and Services                                                Prior Living Arrangements/Services                       Activities of Daily Living Home Assistive Devices/Equipment: Oxygen, Walker (specify type) ADL Screening (condition at time of admission) Patient's cognitive ability adequate to safely complete daily activities?: Yes Is the patient deaf or have difficulty hearing?: No Does the patient have difficulty seeing, even when wearing glasses/contacts?: No Does the patient have difficulty concentrating, remembering, or making decisions?: No Patient able to express need for assistance with ADLs?: Yes Does the patient have difficulty dressing or bathing?: No Independently performs ADLs?: Yes (appropriate for developmental age) Does the patient have difficulty walking or climbing stairs?: Yes Weakness of Legs: Both Weakness of Arms/Hands: None  Permission Sought/Granted                  Emotional Assessment              Admission diagnosis:  Orthostatic hypotension [I95.1] Pneumonia [J18.9] Weakness [R53.1] Community acquired pneumonia of right lower lobe of lung [J18.9] Patient Active Problem List   Diagnosis Date Noted   Community acquired pneumonia of right lower lobe of lung 01/12/2022   Orthostatic hypotension     Persistent atrial fibrillation ([place]) 12/27/2021   Anemia due to antineoplastic chemotherapy 12/09/2021   Anemia 09/27/2021   Metastasis to bone ([place]) 06/27/2021   Protein-calorie malnutrition, severe 06/27/2021   Squamous cell lung cancer, left ([place])    Pain due to malignant neoplasm metastatic to bone ([place]) 06/25/2021   Pulmonary embolism ([place]) 06/25/2021   Hypercalcemia of malignancy 50/08/3817   Chronic systolic (congestive) heart failure ([place]) 07/19/2019   Nonrheumatic mitral valve regurgitation 07/19/2019   Thrombocytopenia ([place]) 03/23/2019   Diverticulosis of colon with hemorrhage    Acute blood loss anemia    History of prostate cancer 03/22/2019   NSVT (nonsustained ventricular tachycardia) 03/22/2019   GIB (gastrointestinal bleeding) 03/21/2019   Lower GI bleeding 11/19/2018   Hepatic lesion 05/22/2017   History of diverticulitis 05/22/2017   History of GI bleed 05/22/2017   Prostate cancer ([place]) 05/22/2017   GERD (gastroesophageal reflux disease) 05/22/2017   Normocytic anemia 05/22/2017   Essential hypertension 05/22/2017   Chronic kidney disease 05/22/2017   Cigarette smoker 05/22/2017   PCP:  Maryella Shivers, MD Pharmacy:   [facility] [place], [place] - [address] AT [place] [place] [address] Phone: [phone]  Fax: [phone]     Social Determinants of Health (SDOH) Interventions    Readmission Risk Interventions No flowsheet data found.

## 2022-01-13 NOTE — Procedures (Signed)
PROCEDURE SUMMARY:  Successful US guided right thoracentesis. Yielded 850cc of yellow pleural fluid. Pt tolerated procedure well. No immediate complications.  Specimen was sent for labs. CXR ordered.  EBL < 5 mL  Jeydi Klingel PA-C 01/13/2022 9:48 AM

## 2022-01-13 NOTE — Evaluation (Signed)
Occupational Therapy Evaluation Patient Details Name: Robert Hamilton MRN: [id number] DOB: [birth year] Today's Date: 01/13/2022   History of Present Illness Pt is a 88 y.o. M who presents 01/12/2022 with increasing fatigue, generalized weakness, and near syncope. Work up suggestive of possible PNA with parapneumonic effusion. Significant PMH: squamous cell lung cancer stage IVa on chemotherapy started on Keytruda with second cycle on January 01, 2022, chronic hypoxic respiratory failure on home O2 at 3 L 76/7, chronic systolic CHF LVEF 34% on Echo in Oct 2022, COPD, anemia, right hip bone metastasis s/p palliative radiation   Clinical Impression   Harris was evaluated s/p the admission list, he is mod I at baseline with use of RW. He lives by himself, his daughter is near by and assists with IADLs. Upon evaluation pt required set up for all sitting ADLs and supervision for all OOB ADLs. He is limited by generalized weakness and poor activity tolerance. Maintained 3L nasal canula throughout with SpO2 >90%.  Pt will benefit from OT acutely to address the limitations listed below. Recommend d/c to home without follow up OT, anticipate he will progress well acutely.      Recommendations for follow up therapy are one component of a multi-disciplinary discharge planning process, led by the attending physician.  Recommendations may be updated based on patient status, additional functional criteria and insurance authorization.   Follow Up Recommendations  No OT follow up    Assistance Recommended at Discharge Intermittent Supervision/Assistance  Patient can return home with the following Assistance with cooking/housework;Direct supervision/assist for medications management    Functional Status Assessment  Patient has had a recent decline in their functional status and demonstrates the ability to make significant improvements in function in a reasonable and predictable amount of time.  Equipment  Recommendations  None recommended by OT    Recommendations for Other Services       Precautions / Restrictions Precautions Precautions: Fall Restrictions Weight Bearing Restrictions: No      Mobility Bed Mobility Overal bed mobility: Modified Independent                  Transfers Overall transfer level: Needs assistance Equipment used: Rolling walker (2 wheels) Transfers: Sit to/from Stand Sit to Stand: Supervision                  Balance Overall balance assessment: Mild deficits observed, not formally tested                                         ADL either performed or assessed with clinical judgement   ADL Overall ADL's : Needs assistance/impaired Eating/Feeding: Independent;Sitting   Grooming: Supervision/safety;Standing Grooming Details (indicate cue type and reason): standing at the sink Upper Body Bathing: Set up;Sitting   Lower Body Bathing: Supervison/ safety;Sit to/from stand   Upper Body Dressing : Set up;Sitting   Lower Body Dressing: Supervision/safety;Sit to/from stand   Toilet Transfer: Supervision/safety;Ambulation;Regular Toilet;Rolling walker (2 wheels)   Toileting- Clothing Manipulation and Hygiene: Supervision/safety;Sitting/lateral lean       Functional mobility during ADLs: Supervision/safety;Rolling walker (2 wheels) General ADL Comments: limited by insight into safety, activity tolerance and generalized weakness     Vision Baseline Vision/History: 0 No visual deficits Vision Assessment?: No apparent visual deficits     Perception     Praxis      Pertinent Vitals/Pain Pain Assessment Pain Assessment: No/denies  pain     Hand Dominance Right   Extremity/Trunk Assessment Upper Extremity Assessment Upper Extremity Assessment: Generalized weakness   Lower Extremity Assessment Lower Extremity Assessment: Defer to PT evaluation   Cervical / Trunk Assessment Cervical / Trunk Assessment:  Kyphotic   Communication Communication Communication: No difficulties   Cognition Arousal/Alertness: Awake/alert Behavior During Therapy: WFL for tasks assessed/performed Overall Cognitive Status: No family/caregiver present to determine baseline cognitive functioning                                 General Comments: Pt able to correctly state month with increased time, requires repetition for some questions     General Comments  VSS on 3L nasal canula    Exercises     Shoulder Instructions      Home Living Family/patient expects to be discharged to:: Private residence Living Arrangements: Alone Available Help at Discharge: Family;Available PRN/intermittently Type of Home: Apartment Home Access: Level entry     Home Layout: One level     Bathroom Shower/Tub: Teacher, early years/pre: Standard     Home Equipment: Conservation officer, nature (2 wheels);Cane - single point;Shower seat   Additional Comments: daughter lives within a mile      Prior Functioning/Environment Prior Level of Function : Needs assist             Mobility Comments: uses walker ADLs Comments: requires assist for IADL's from daughter i.e. medication management, meals        OT Problem List: Decreased strength;Decreased activity tolerance;Impaired balance (sitting and/or standing);Decreased safety awareness      OT Treatment/Interventions: Self-care/ADL training;Therapeutic exercise;Balance training;Patient/family education;Therapeutic activities;DME and/or AE instruction    OT Goals(Current goals can be found in the care plan section) Acute Rehab OT Goals Patient Stated Goal: home OT Goal Formulation: With patient Time For Goal Achievement: 01/27/22 Potential to Achieve Goals: Good ADL Goals Additional ADL Goal #1: pt will indep recall energy conservation strategies to apply at d/c Additional ADL Goal #2: pt will indep complete all BADLs  OT Frequency: Min 2X/week     Co-evaluation              AM-PAC OT "6 Clicks" Daily Activity     Outcome Measure Help from another person eating meals?: None Help from another person taking care of personal grooming?: A Little Help from another person toileting, which includes using toliet, bedpan, or urinal?: A Little Help from another person bathing (including washing, rinsing, drying)?: A Little Help from another person to put on and taking off regular upper body clothing?: None Help from another person to put on and taking off regular lower body clothing?: A Little 6 Click Score: 20   End of Session Equipment Utilized During Treatment: Gait belt;Rolling walker (2 wheels);Oxygen Nurse Communication: Mobility status  Activity Tolerance: Patient tolerated treatment well Patient left: in chair;with call bell/phone within reach;with chair alarm set  OT Visit Diagnosis: Unsteadiness on feet (R26.81);Other abnormalities of gait and mobility (R26.89);Muscle weakness (generalized) (M62.81);History of falling (Z91.81)                Time: 8921-1941 OT Time Calculation (min): 12 min Charges:  OT General Charges $OT Visit: 1 Visit OT Evaluation $OT Eval Low Complexity: 1 Low  Sarabelle Genson A Kash [place] 01/13/2022, 12:30 PM

## 2022-01-13 NOTE — Consult Note (Signed)
Consultation Note Date: 01/13/2022   Patient Name: Robert Hamilton  DOB: 27-Sep-1934  MRN: [id number]  Age / Sex: 88 y.o., male  PCP: Robert Shivers, MD Referring Physician: Thurnell Lose, MD  Reason for Consultation: Establishing goals of care "advanced lung cancer on palliative chemo"  HPI/Patient Profile: 88 y.o. male  with past medical history of squamous cell lung cancer stage IVa on chemotherapy, chronic hypoxic respiratory failure on home O2 at 3L, chronic systolic HFrEF (40% on echo October 2022), COPD, anemia, and right hip bone metastasis s/p palliative radiation. He presented to the emergency department on 01/12/2022 with increasing fatigue, generalized weakness, and syncope. In the ED chest x-ray showed right lower lobe pneumonia ad moderately large right-sided effusion. Admitted to [facility].   Clinical Assessment and Goals of Care: I have reviewed medical records including EPIC notes, labs and imaging, and went to see patient at bedside  to discuss diagnosis, prognosis, GOC, EOL wishes, disposition, and options.  I introduced Palliative Medicine as specialized medical care for people living with serious illness. It focuses on providing relief from the symptoms and stress of a serious illness.   We discussed a brief life review of the patient. Robert Hamilton is from [place] but has lived in several other states. He served in [employer] and was stationed in [place] for a time. 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 He has 2 daughter - Robert Hamilton (lives in [place]) and Robert Hamilton (lives in [place]).   [name] lives alone in his house in [facility]). Robert Hamilton lives nearby. As far as functional status, he is ambulatory with a walker and independent with his ADLs. During my visit, he was able to ambulate (with walker) independently to the bathroom.   We  discussed his current illness and what it means in the larger context of his ongoing co-morbidities.  Natural disease trajectory of advanced cancer was discussed. Reviewed that metastatic cancer is a non-curable condition and intent of treatment is palliative and life-prolonging.   I attempted to elicit values and goals of care important to the patient. He states "I want to live as long as I can". He also states "but I don't want to suffer".   We did discuss code status. Encouraged patient to consider DNR/DNI status understanding evidenced based poor outcomes in similar hospitalized patients, as the cause of the arrest is likely associated with chronic/terminal disease rather than a reversible acute cardio-pulmonary event.  I explained that DNR/DNI is a protective measure to keep [place] from harming the patient in their last moments of life. [name] states he would not want CPR, defibrillation, or intubation and is agreeable to DNR/DNI status.   I asked Robert Hamilton who would make medical decisions for him if he was unable to make those decisions for himself. He states that Robert Hamilton "takes care of everything". I encouraged him to complete advanced directives while in the hospital, but he declines.   I spoke with daughter Robert Hamilton by phone. I explained the  role of palliative care and told her about my conversation with her father. I let her know that he does not want resuscitation efforts in the event of cardiopulmonary arrest. She supports his decision.  Questions and concerns were addressed. I provided PMT contact info and encouraged her to call with questions or concerns.    Primary decision maker: patient    SUMMARY OF RECOMMENDATIONS   Code status changed to DNR/DNI Continue current interventions Goal of care is to improve and return home Patient declines completing advanced directives  PMT will continue to follow  Code Status/Advance Care Planning: DNR  Symptom Management:  Oxycodone IR 10 mg every  4 hours prn pain (home medication)  Prognosis:  Unable to determine  Discharge Planning: Home      Primary Diagnoses: Present on Admission: **None**   I have reviewed the medical record, interviewed the patient and family, and examined the patient. The following aspects are pertinent.  Past Medical History:  Diagnosis Date   Anemia    Anemia 09/27/2021   Cancer (HCC)    CHF (congestive heart failure) (HCC)    Chronic kidney disease    COPD (chronic obstructive pulmonary disease) (HCC)    Diverticulosis    GERD (gastroesophageal reflux disease)    Hypertension      Family History  Problem Relation Age of Onset   Heart attack Mother    Hypertension Mother    Hypertension Father    Hypertension Sister    Hypertension Sister    Hypertension Sister    Hypertension Sister    Scheduled Meds:  allopurinol  100 mg Oral Daily   apixaban  5 mg Oral BID   azithromycin  500 mg Oral Daily   Chlorhexidine Gluconate Cloth  6 each Topical Daily   feeding supplement  237 mL Oral BID BM   folic acid  1 mg Oral Daily   guaiFENesin  1,200 mg Oral BID   lactose free nutrition  237 mL Oral TID WC   metoprolol succinate  25 mg Oral Daily   pantoprazole  40 mg Oral Daily   sacubitril-valsartan  1 tablet Oral BID   sodium chloride flush  10-40 mL Intracatheter Q12H   Continuous Infusions:  cefTRIAXone (ROCEPHIN)  IV 2 g (01/13/22 1301)   PRN Meds:.acetaminophen, albuterol, hydrALAZINE, ondansetron, oxyCODONE, prochlorperazine, sodium chloride flush   Allergies  Allergen Reactions   Paclitaxel Shortness Of Breath   Review of Systems  Neurological:  Positive for headaches.   Physical Exam Vitals reviewed.  Constitutional:      General: He is not in acute distress.    Comments: Thin, chronically ill-appearing  Cardiovascular:     Rate and Rhythm: Normal rate.  Pulmonary:     Effort: Pulmonary effort is normal.  Neurological:     Mental Status: He is alert and oriented  to person, place, and time.    Vital Signs: BP (!) 158/97 (BP Location: Right Arm)    Pulse 77    Temp 98 F (36.7 C) (Oral)    Resp 19    Ht 5\' 9"  (1.753 m)    Wt 55.6 kg    SpO2 99%    BMI 18.10 kg/m  Pain Scale: 0-10   Pain Score: 0-No pain   SpO2: SpO2: 99 % O2 Device:SpO2: 99 % O2 Flow Rate: .O2 Flow Rate (L/min): 3 L/min   LBM: Last BM Date: 01/12/22 Baseline Weight: Weight: 55.6 kg Most recent weight: Weight: 55.6 kg  Palliative Assessment/Data: PPS 60%    MDM - High due to: 1 or more chronic illnesses with severe exacerbation, progression, or side effects of treatment OR acute or chronic illness or injury that poses a threat to life or bodily function Review of prior external notes, review of test results, assessment requiring an independent historian Decision not to resuscitate or to de-escalate care    Signed by: Lavena Bullion, NP   Please contact Palliative Medicine Team phone at [phone] for questions and concerns.  For individual provider: See Shea Evans

## 2022-01-14 ENCOUNTER — Inpatient Hospital Stay (HOSPITAL_COMMUNITY): Payer: Medicare Other

## 2022-01-14 LAB — CBC WITH DIFFERENTIAL/PLATELET
Abs Immature Granulocytes: 0.02 10*3/uL (ref 0.00–0.07)
Basophils Absolute: 0 10*3/uL (ref 0.0–0.1)
Basophils Relative: 0 %
Eosinophils Absolute: 0.1 10*3/uL (ref 0.0–0.5)
Eosinophils Relative: 1 %
HCT: 31.6 % — ABNORMAL LOW (ref 39.0–52.0)
Hemoglobin: 9.4 g/dL — ABNORMAL LOW (ref 13.0–17.0)
Immature Granulocytes: 0 %
Lymphocytes Relative: 20 %
Lymphs Abs: 1.2 10*3/uL (ref 0.7–4.0)
MCH: 27.6 pg (ref 26.0–34.0)
MCHC: 29.7 g/dL — ABNORMAL LOW (ref 30.0–36.0)
MCV: 92.9 fL (ref 80.0–100.0)
Monocytes Absolute: 0.4 10*3/uL (ref 0.1–1.0)
Monocytes Relative: 7 %
Neutro Abs: 4.3 10*3/uL (ref 1.7–7.7)
Neutrophils Relative %: 72 %
Platelets: 198 10*3/uL (ref 150–400)
RBC: 3.4 MIL/uL — ABNORMAL LOW (ref 4.22–5.81)
RDW: 14.6 % (ref 11.5–15.5)
WBC: 5.9 10*3/uL (ref 4.0–10.5)
nRBC: 0 % (ref 0.0–0.2)

## 2022-01-14 LAB — COMPREHENSIVE METABOLIC PANEL
ALT: 7 U/L (ref 0–44)
AST: 13 U/L — ABNORMAL LOW (ref 15–41)
Albumin: 2.7 g/dL — ABNORMAL LOW (ref 3.5–5.0)
Alkaline Phosphatase: 92 U/L (ref 38–126)
Anion gap: 6 (ref 5–15)
BUN: 21 mg/dL (ref 8–23)
CO2: 24 mmol/L (ref 22–32)
Calcium: 9 mg/dL (ref 8.9–10.3)
Chloride: 109 mmol/L (ref 98–111)
Creatinine, Ser: 1.16 mg/dL (ref 0.61–1.24)
GFR, Estimated: >60 mL/min (ref >60)
Glucose, Bld: 123 mg/dL — ABNORMAL HIGH (ref 70–99)
Potassium: 4.6 mmol/L (ref 3.5–5.1)
Sodium: 139 mmol/L (ref 135–145)
Total Bilirubin: 0.2 mg/dL — ABNORMAL LOW (ref 0.3–1.2)
Total Protein: 6.3 g/dL — ABNORMAL LOW (ref 6.5–8.1)

## 2022-01-14 LAB — MAGNESIUM: Magnesium: 1.7 mg/dL (ref 1.7–2.4)

## 2022-01-14 LAB — BRAIN NATRIURETIC PEPTIDE: B Natriuretic Peptide: 433.7 pg/mL — ABNORMAL HIGH (ref 0.0–100.0)

## 2022-01-14 LAB — PROTEIN, PLEURAL OR PERITONEAL FLUID: Total protein, fluid: 3.5 g/dL

## 2022-01-14 LAB — PROCALCITONIN: Procalcitonin: <0.1 ng/mL

## 2022-01-14 LAB — C-REACTIVE PROTEIN: CRP: 1.5 mg/dL — ABNORMAL HIGH (ref <1.0)
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
## 2022-01-14 MED ORDER — SACUBITRIL-VALSARTAN 24-26 MG PO TABS
1.0000 | ORAL_TABLET | Freq: Two times a day (BID) | ORAL | Status: DC
  Administered 2022-01-16: 1 via ORAL
  Filled 2022-01-14: qty 1

## 2022-01-14 MED ORDER — LACTATED RINGERS IV SOLN: INTRAVENOUS | Status: AC

## 2022-01-14 MED ORDER — ADULT MULTIVITAMIN W/MINERALS CH
1.0000 | ORAL_TABLET | Freq: Every day | ORAL | Status: DC
  Administered 2022-01-14 – 2022-01-16 (×3): 1 via ORAL
  Filled 2022-01-14 (×3): qty 1

## 2022-01-14 MED ORDER — ENSURE ENLIVE PO LIQD
237.0000 mL | Freq: Three times a day (TID) | ORAL | Status: DC
  Administered 2022-01-15 (×2): 237 mL via ORAL

## 2022-01-14 MED ORDER — IOHEXOL 350 MG/ML SOLN
80.0000 mL | Freq: Once | INTRAVENOUS | Status: AC | PRN
  Administered 2022-01-14: 80 mL via INTRAVENOUS

## 2022-01-14 NOTE — Consult Note (Signed)
[place] Nurse Consult Note: Patient receiving care in [facility] [phone] Reason for Consult: left great toenail came off Wound type: Left great toenail patient states came off but is now beginning to grow back. Right great toenail is loose and appears to be ready to come off. Left little toe has a small scabbed area.  Pressure Injury POA: NA Measurement: Deferred Wound bed: dry but no signs of infection or drainage. Toe nails need to be cut.  Periwound: intact Dressing procedure/placement/frequency: Clean both feet with soap and water, rinse and pat dry. Paint the effected toes with betadine (Iodine swabs) and allow to air dry. Apply daily.  Monitor the wound area(s) for worsening of condition such as: Signs/symptoms of infection, increase in size, development of or worsening of odor, development of pain, or increased pain at the affected locations.   Notify the medical team if any of these develop.  Thank you for the consult. [facility] nurse will not follow at this time.   Please re-consult the [facility] team if needed.  Cathlean Marseilles Tamala Julian, MSN, RN, [place], Lysle Pearl, [facility] Wound Treatment Associate Pager [phone]

## 2022-01-14 NOTE — Progress Notes (Signed)
Initial Nutrition Assessment  DOCUMENTATION CODES:  Severe malnutrition in context of chronic illness, Underweight  INTERVENTION:  Continue diet as ordered.  Increase Ensure from BID to TID.  Add MVI with minerals daily.  Encourager PO and supplement intake.  NUTRITION DIAGNOSIS:  Severe Malnutrition related to chronic illness, cancer and cancer related treatments as evidenced by severe fat depletion, severe muscle depletion.  GOAL:  Patient will meet greater than or equal to 90% of their needs  MONITOR:  PO intake, Supplement acceptance, Diet advancement, Weight trends, Skin, I & O's  REASON FOR ASSESSMENT:  Consult Assessment of nutrition requirement/status  ASSESSMENT:  88 yo male with a PMH of squamous cell lung cancer stage IVa on chemotherapy started on Keytruda with second cycle on 01/01/22, chronic hypoxic respiratory failure on home O2 at 3 L, chronic systolic CHF LVEF 16%, COPD, anemia, and right hip bone metastasis s/p palliative radiation who presents with increasing fatigue, generalized weakness, and near syncope. Admitted with CAP of RLL lung. 2/12 - thoracentesis (yield of 850 ml of yellow pleural fluid)  Spoke with pt. Pt mumbling, so difficult to understand at times.  Pt reports a pretty decent appetite before coming into the hospital, and he has not had much of one lately. He reports that he drinks Ensure at home, but is not particularly "fond" of it, but he will drink it to help heal himself.  Per Epic, pt ate 100% of dinner on 2/11. No other meals documented.  Pt endorses weight loss but is unable to confirm a time frame or the amount lost.  Per Epic, pt has lost ~8 lbs (6%) in the last 2 weeks. Difficult to determine if this is fluid weight or true weight loss.  Supplements: Ensure BID  Medications: reviewed; azithromycin, folic acid, Protonix, IV ABX x1, LR @ 75 ml/hr, oxycodone PO PRN (given once today)  Labs: reviewed; Glucose 123 (H)  NUTRITION -  FOCUSED PHYSICAL EXAM: Flowsheet Row Most Recent Value  Orbital Region Severe depletion  Upper Arm Region Moderate depletion  Thoracic and Lumbar Region Moderate depletion  Buccal Region Severe depletion  Temple Region Severe depletion  Clavicle Bone Region Severe depletion  Clavicle and Acromion Bone Region Severe depletion  Scapular Bone Region Severe depletion  Dorsal Hand Severe depletion  Patellar Region Severe depletion  Anterior Thigh Region Severe depletion  Posterior Calf Region Severe depletion  Edema (RD Assessment) None  Hair Reviewed  Eyes Reviewed  Mouth Reviewed  Skin Reviewed  Nails Reviewed   Diet Order:   Diet Order             DIET SOFT Room service appropriate? Yes with Assist; Fluid consistency: Thin  Diet effective now                  EDUCATION NEEDS:  Education needs have been addressed  Skin:  Skin Assessment: Skin Integrity Issues: Skin Integrity Issues:: Other (Comment) Other: Puncture wound from thoracentesis (2/12)  Last BM:  01/14/22 - Type 3, large  Height:  Ht Readings from Last 1 Encounters:  01/12/22 5\' 9"  (1.753 m)   Weight:  Wt Readings from Last 1 Encounters:  01/12/22 55.6 kg   BMI:  Body mass index is 18.1 kg/m.  Estimated Nutritional Needs:  Kcal:  2100-2300 Protein:  85-100 grams Fluid:  >2.1 L  Derrel Nip, RD, LDN (she/her/hers) Clinical Inpatient Dietitian RD Pager/After-Hours/Weekend Pager # in [place]

## 2022-01-14 NOTE — Discharge Instructions (Addendum)
Follow with Primary MD Maryella Shivers, MD in 7 days   Get CBC, CMP, 2 view Chest X ray -  checked next visit within 1 week by Primary MD    Activity: As tolerated with Full fall precautions use walker/cane & assistance as needed  Disposition Home    Diet: Soft with feeding assistance and aspiration precautions.  Special Instructions: If you have smoked or chewed Tobacco  in the last 2 yrs please stop smoking, stop any regular Alcohol  and or any Recreational drug use.  On your next visit with your primary care physician please Get Medicines reviewed and adjusted.  Please request your Prim.MD to go over all Hospital Tests and Procedure/Radiological results at the follow up, please get all Hospital records sent to your Prim MD by signing hospital release before you go home.  If you experience worsening of your admission symptoms, develop shortness of breath, life threatening emergency, suicidal or homicidal thoughts you must seek medical attention immediately by calling 911 or calling your MD immediately  if symptoms less severe.  You Must read complete instructions/literature along with all the possible adverse reactions/side effects for all the Medicines you take and that have been prescribed to you. Take any new Medicines after you have completely understood and accpet all the possible adverse reactions/side effects.   Suggestions For Increasing Calories And Protein Several small meals a day are easier to eat and digest than three large ones. Space meals about 2 to 3 hours apart to maximize comfort. Stop eating 2 to 3 hours before bed and sleep with your head elevated if gastric reflux (GERD) and heartburn are problems. Do not eat your favorite foods if you are feeling bad. Save them for when you feel good! Eat breakfast-type foods at any meal. Eggs are usually easy to eat and are great any time of the day. (The same goes for pancakes and waffles.) Eat when you feel hungry. Most people  have the greatest appetite in the morning because they have not eaten all night. If this is the best meal for you, then pile on those calories and other nutrients in the morning and at lunch. Then you can have a smaller dinner without losing total calories for the day. Eat leftovers or nutritious snacks in the afternoon and early evening to round out your day. Try homemade or commercially prepared nutrition bars and puddings, as well as calorie- and protein-rich liquid nutritional supplements. Benefits of Physical Activity Talk to your doctor about physical activity. Light or moderate physical activity can help maintain muscle and promote an appetite. Walking in the neighborhood or the local mall is a great way to get up, get out, and get moving. If you are unsteady on your feet, try walking around the dining room table. Save Room for [employer]! Drink most fluids between meals instead of with meals. (It is fine to have a sip to help swallow food at meal time.) Fluids (which usually have fewer calories and nutrients than solid food) can take up valuable space in your stomach.  Foods Recommended High-Protein Foods Milk products Add cheese to toast, crackers, sandwiches, baked potatoes, vegetables, soups, noodles, meat, and fruit. Use reduced-fat (2%) or whole milk in place of water when cooking cereal and cream soups. Include cream sauces on vegetables and pasta. Add powdered milk to cream soups and mashed potatoes.  Eggs Have hard-cooked eggs readily available in the refrigerator. Chop and add to salads, casseroles, soups, and vegetables. Make a quick egg  salad. All eggs should be well cooked to avoid the risk of harmful bacteria.  Meats, poultry, and fish Add leftover cooked meats to soups, casseroles, salads, and omelets. Make dip by mixing diced, chopped, or shredded meat with sour cream and spices.  Beans, legumes, nuts, and seeds Sprinkle nuts and seeds on cereals, fruit, and desserts such  as ice cream, pudding, and custard. Also serve nuts and seeds on vegetables, salads, and pasta. Spread peanut butter on toast, bread, English muffins, and fruit, or blend it in a milk shake. Add beans and peas to salads, soups, casseroles, and vegetable dishes.  High-Calorie Foods Butter, margarine, and  oils Melt butter or margarine over potatoes, rice, pasta, and cooked vegetables. Add melted butter or margarine into soups and casseroles and spread on bread for sandwiches before spreading sandwich spread or peanut butter. Saut or stir-fry vegetables, meats, chicken and fish such as shrimp/scallops in olive or canola oil. A variety of oils add calories and can be used to [employer], chicken, or fish.  Milk products Add whipping cream to desserts, pancakes, waffles, fruit, and hot chocolate, and fold it into soups and casseroles. Add sour cream to baked potatoes and vegetables.  Salad dressing Use regular (not low-fat or diet) mayonnaise and salad dressing on sandwiches and in dips with vegetables and fruit.   Sweets Add jelly and honey to bread and crackers. Add jam to fruit and ice cream and as a topping over cake.   Copyright 2020  Academy of [employer] Dietetics. All rights reserved.
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
## 2022-01-14 NOTE — Progress Notes (Signed)
Robert Hamilton,  Robert  [id number] [phone]  Clinic Day:  01/18/2022  Referring physician: Maryella Shivers, Robert  This document serves as Robert record of services personally performed by Robert Hamilton, Robert Hamilton, Robert Hamilton the scribe's personal observations and the provider's statements to them.  HISTORY OF PRESENT ILLNESS:  The patient is an 88 y.o. male  stage IVA (T3 N0 M1b) squamous cell lung cancer.   He comes in today to be evaluated before heading into his 3rd cycle of maintenance pembrolizumab.  This was preceded by 4 cycles of chemotherapy, which consisted of carboplatin/nab-paclitaxel/pembrolizumab.  Unfortunately, since his last visit, the patient was hospitalized in [place] for progressive weakness and Robert near syncopal episode.  While in the hospital, scans done revealed pneumonia, as well as Robert pleural effusion.  Furthermore, due to having headaches and dizziness, Robert brain MRI was done, which unfortunately showed Robert metastatic brain lesion in his right thalamus.  At the time of his discharge, hospice was strongly recommended.  He comes in today to discuss what he wished to do next.  Overall, he claims to be doing okay.  However, as what is chronically been the case, he is essentially in his wheelchair throughout most of the day.  He denies having any significant headaches at the moment.  However, his demeanor, although belligerent, is not similar to what it has been in the past.  His daughter states that he has Robert hard time believing he has metastatic CNS disease.    PHYSICAL EXAM: Blood pressure 135/88, pulse (!) 110, temperature 98 F (36.7 C), resp. rate 16, height 5\' 9"  (1.753 m), weight 123 lb 9.6 oz (56.1 kg), SpO2 96 %. Wt Readings from Last 3 Encounters:  01/22/22 127 lb 1.3 oz (57.6 kg)  01/18/22 123 lb 9.6 oz (56.1 kg)   01/12/22 122 lb 9.2 oz (55.6 kg)   Body mass index is 18.25 kg/m. Performance status (ECOG): 2 - Symptomatic, <50% confined to bed Physical Exam Constitutional:      General: He is not in acute distress.    Appearance: Normal appearance. He is normal weight.     Comments: Thin gentleman in Robert wheelchair  HENT:     Head: Normocephalic and atraumatic.  Eyes:     General: No scleral icterus.    Extraocular Movements: Extraocular movements intact.     Conjunctiva/sclera: Conjunctivae normal.     Pupils: Pupils are equal, round, and reactive to light.  Cardiovascular:     Rate and Rhythm: Normal rate and regular rhythm.     Pulses: Normal pulses.     Heart sounds: Normal heart sounds. No murmur heard.   No friction rub. No gallop.  Pulmonary:     Effort: Pulmonary effort is normal. No respiratory distress.     Breath sounds: Normal breath sounds.  Abdominal:     General: Bowel sounds are normal. There is no distension.     Palpations: Abdomen is soft. There is no hepatomegaly, splenomegaly or mass.     Tenderness: There is no abdominal tenderness.  Musculoskeletal:        General: Normal range of motion.     Cervical back: Normal range of motion and neck supple.     Right lower leg: No edema.     Left lower leg: No edema.  Lymphadenopathy:  Cervical: No cervical adenopathy.  Skin:    General: Skin is warm and dry.  Neurological:     General: No focal deficit present.     Mental Status: He is alert and oriented to person, place, and time. Mental status is at baseline.  Psychiatric:        Mood and Affect: Mood is anxious.        Behavior: Behavior normal.        Thought Content: Thought content normal.        Judgment: Judgment normal.   LABS:    Latest Reference Range & Units 01/18/22 00:00  Sodium 137 - 147  141  Potassium 3.4 - 5.3  3.9  Chloride 99 - 108  110 !  CO2 13 - 22  23 !  Glucose  102  BUN 4 - 21  13  Creatinine 0.6 - 1.3  1.0  Calcium 8.7 - 10.7   8.8  Alkaline Phosphatase 25 - 125  103  Albumin 3.5 - 5.0  3.6  AST 14 - 40  22  ALT 10 - 40  13  Bilirubin, Total  0.4  WBC  5.1  RBC 3.87 - 5.11  3.44 !  Hemoglobin 13.5 - 17.5  9.8 !  HCT 41 - 53  31 !  Platelets 150 - 399  218  NEUT#  3.16    ASSESSMENT & PLAN:  An 88 y.o. male with metastatic squamous cell lung cancer, which includes spread of disease to his right hip and brain.  In clinic today, went over his brain MRI images with him and his family, for which they can see the 2 lesions representing his CNS metastasis.  As these lesions are deep and fairly small, I do believe they are amenable to stereotactic radiation.  I will have him see radiation oncology in the forthcoming days to discuss gamma knife therapy to the lesions in question.  As there appears to be no obvious disease elsewhere, he will proceed with his 3rd cycle of maintenance pembrolizumab early next week.  Physically, it does appear this gentleman is weaker than what he has been in the past.  I made sure he understood that if he is no longer is interested in undergoing any form of treatment for his disease, I would have no problem discontinuing therapy and focusing more Hamilton end-of-life care via hospice.  At this point time, he does not appear interested in this option.  For now, I will see him back in 3 weeks before he heads into Robert potential  4th cycle of maintenance pembrolizumab immunotherapy.  The patient understands all the plans discussed today and is in agreement with them.   I, Rita Ohara, am acting as scribe for Robert Hamilton, Robert    I have reviewed this report as typed by the medical scribe, and it is complete and accurate.  Talayla Doyel Macarthur Critchley, Robert

## 2022-01-14 NOTE — Progress Notes (Signed)
Physical Therapy Treatment Patient Details Name: Robert Hamilton MRN: [id number] DOB: 1934-11-27 Today's Date: 01/14/2022   History of Present Illness Pt is a 88 y.o. M who presents 01/12/2022 with increasing fatigue, generalized weakness, and near syncope. Work up suggestive of possible PNA with parapneumonic effusion. Significant PMH: squamous cell lung cancer stage IVa on chemotherapy started on Keytruda with second cycle on January 01, 2022, chronic hypoxic respiratory failure on home O2 at 3 L 27/7, chronic systolic CHF LVEF 41% on Echo in Oct 2022, COPD, anemia, right hip bone metastasis s/p palliative radiation    PT Comments    Patient requires cues for correct use of RW during transfers and when making a tight turn in a narrow space. Overall, tolerating activity on room air well.    Recommendations for follow up therapy are one component of a multi-disciplinary discharge planning process, led by the attending physician.  Recommendations may be updated based on patient status, additional functional criteria and insurance authorization.  Follow Up Recommendations  No PT follow up     Assistance Recommended at Discharge PRN  Patient can return home with the following Direct supervision/assist for medications management;Assistance with cooking/housework;Assist for transportation   Equipment Recommendations  None recommended by PT    Recommendations for Other Services       Precautions / Restrictions Precautions Precautions: Fall     Mobility  Bed Mobility Overal bed mobility: Modified Independent                  Transfers Overall transfer level: Needs assistance Equipment used: Rolling walker (2 wheels) Transfers: Sit to/from Stand Sit to Stand: Supervision                Ambulation/Gait Ambulation/Gait assistance: Supervision Gait Distance (Feet): 200 Feet Assistive device: Rolling walker (2 wheels) Gait Pattern/deviations: Step-through pattern,  Decreased stride length Gait velocity: decreased     General Gait Details: no gross imbalance noted, supervision for safety/lines   Stairs             Wheelchair Mobility    Modified Rankin (Stroke Patients Only)       Balance Overall balance assessment: Mild deficits observed, not formally tested                                          Cognition Arousal/Alertness: Awake/alert Behavior During Therapy: WFL for tasks assessed/performed Overall Cognitive Status: No family/caregiver present to determine baseline cognitive functioning                                          Exercises      General Comments General comments (skin integrity, edema, etc.): on RA with sats 90%      Pertinent Vitals/Pain Pain Assessment Pain Assessment: Faces Faces Pain Scale: No hurt    Home Living                          Prior Function            PT Goals (current goals can now be found in the care plan section) Acute Rehab PT Goals Patient Stated Goal: get stronger Time For Goal Achievement: 01/27/22 Potential to Achieve Goals: Good Progress towards PT goals: Progressing toward goals  Frequency    Min 3X/week      PT Plan Current plan remains appropriate    Co-evaluation              AM-PAC PT "6 Clicks" Mobility   Outcome Measure  Help needed turning from your back to your side while in a flat bed without using bedrails?: None Help needed moving from lying on your back to sitting on the side of a flat bed without using bedrails?: None Help needed moving to and from a bed to a chair (including a wheelchair)?: A Little Help needed standing up from a chair using your arms (e.g., wheelchair or bedside chair)?: A Little Help needed to walk in hospital room?: A Little Help needed climbing 3-5 steps with a railing? : A Little 6 Click Score: 20    End of Session Equipment Utilized During Treatment: Gait  belt Activity Tolerance: Patient tolerated treatment well Patient left: with call bell/phone within reach;in bed;with bed alarm set Nurse Communication: Mobility status PT Visit Diagnosis: Unsteadiness on feet (R26.81)     Time: 7342-8768 PT Time Calculation (min) (ACUTE ONLY): 17 min  Charges:  $Gait Training: 8-22 mins                      Arby Barrette, PT Acute Rehabilitation Services  Pager [phone] Office [phone]    Robert Hamilton 01/14/2022, 4:00 PM

## 2022-01-15 ENCOUNTER — Inpatient Hospital Stay (HOSPITAL_COMMUNITY): Payer: Medicare Other

## 2022-01-15 DIAGNOSIS — J189 Pneumonia, unspecified organism: Secondary | ICD-10-CM | POA: Diagnosis not present

## 2022-01-15 DIAGNOSIS — I619 Nontraumatic intracerebral hemorrhage, unspecified: Secondary | ICD-10-CM | POA: Diagnosis not present

## 2022-01-15 LAB — BASIC METABOLIC PANEL
Anion gap: 8 (ref 5–15)
BUN: 17 mg/dL (ref 8–23)
CO2: 22 mmol/L (ref 22–32)
Calcium: 9 mg/dL (ref 8.9–10.3)
Chloride: 108 mmol/L (ref 98–111)
Creatinine, Ser: 1.09 mg/dL (ref 0.61–1.24)
GFR, Estimated: >60 mL/min (ref >60)
Glucose, Bld: 107 mg/dL — ABNORMAL HIGH (ref 70–99)
Potassium: 3.6 mmol/L (ref 3.5–5.1)
Sodium: 138 mmol/L (ref 135–145)

## 2022-01-15 LAB — CBC
HCT: 32.1 % — ABNORMAL LOW (ref 39.0–52.0)
Hemoglobin: 9.5 g/dL — ABNORMAL LOW (ref 13.0–17.0)
MCH: 27.9 pg (ref 26.0–34.0)
MCHC: 29.6 g/dL — ABNORMAL LOW (ref 30.0–36.0)
MCV: 94.1 fL (ref 80.0–100.0)
Platelets: 203 10*3/uL (ref 150–400)
RBC: 3.41 MIL/uL — ABNORMAL LOW (ref 4.22–5.81)
RDW: 14.8 % (ref 11.5–15.5)
WBC: 6.1 10*3/uL (ref 4.0–10.5)
nRBC: 0 % (ref 0.0–0.2)

## 2022-01-15 LAB — LEGIONELLA PNEUMOPHILA SEROGP 1 UR AG: L. pneumophila Serogp 1 Ur Ag: NEGATIVE

## 2022-01-15 LAB — TROPONIN I (HIGH SENSITIVITY): Troponin I (High Sensitivity): 11 ng/L (ref <18)

## 2022-01-15 LAB — BRAIN NATRIURETIC PEPTIDE: B Natriuretic Peptide: 249.9 pg/mL — ABNORMAL HIGH (ref 0.0–100.0)

## 2022-01-15 LAB — PATHOLOGIST SMEAR REVIEW

## 2022-01-15 LAB — MAGNESIUM: Magnesium: 1.6 mg/dL — ABNORMAL LOW (ref 1.7–2.4)
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
## 2022-01-15 IMAGING — MR MR HEAD WO/W CM
9 of 13 series · 34 of 48 positions shown · IV contrast (gadavist)
Comparison: No prior MRI, correlation is made with [DATE] CT.

CLINICAL DATA: Metastatic disease evaluation.

EXAM:
MRI HEAD WITHOUT AND WITH CONTRAST
TECHNIQUE: Multiplanar, multiecho pulse sequences of the brain and surrounding
structures were obtained without and with intravenous contrast.
CONTRAST:  5.5mL GADAVIST GADOBUTROL 1 MMOL/ML IV SOLN

[Series 3: DWI · axial · 3.0mm · 1.09mm/px · z∈[-74,+68]mm · 9 of 100 slices shown (1 of 4)]
[im 1/100]
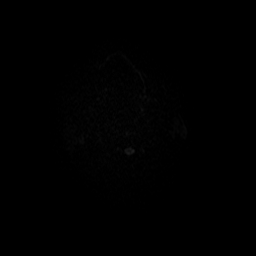
[im 13/100]
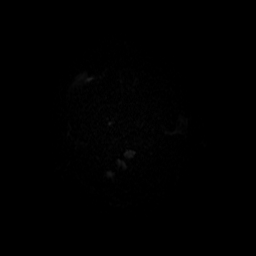
[im 25/100]
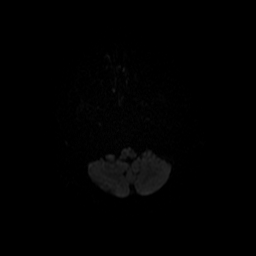
[im 38/100]
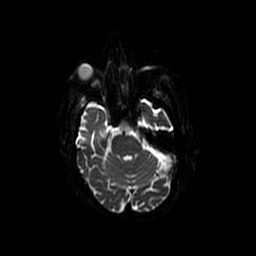
[im 50/100]
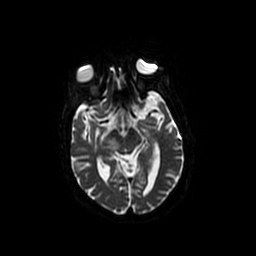
[im 62/100]
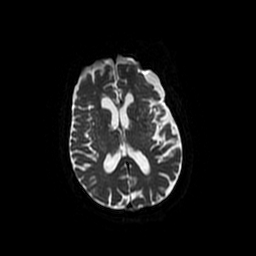
[im 75/100]
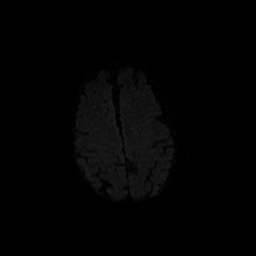
[im 87/100]
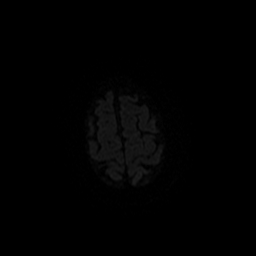
[im 100/100]
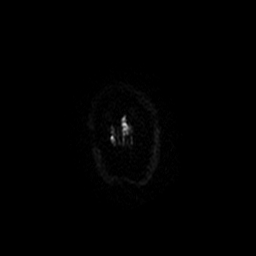

[Series 4: DWI · coronal · 5.0mm · 1.09mm/px · 6 of 76 slices shown (2 of 4)]
[im 1/76]
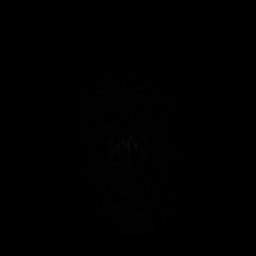
[im 16/76]
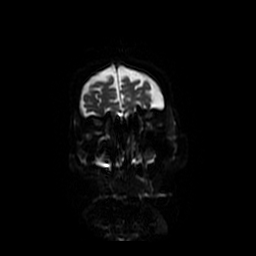
[im 31/76]
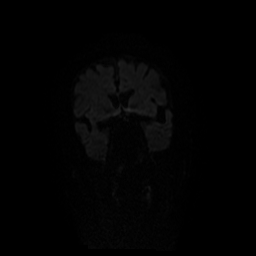
[im 46/76]
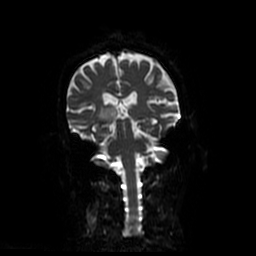
[im 61/76]
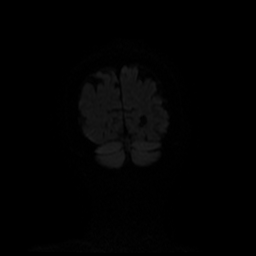
[im 76/76]
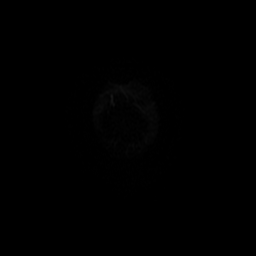

[Series 6: T2 · axial · 5.0mm · 0.47mm/px · z∈[-65,+75]mm · 2 of 25 slices shown]
[im 1/25]
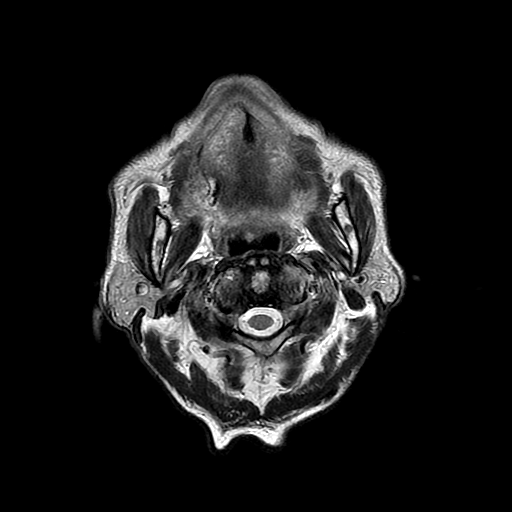
[im 25/25]
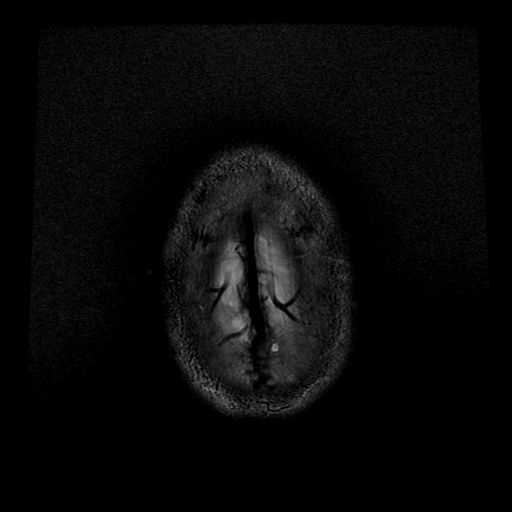

[Series 7: FLAIR · axial · 3.0mm · 0.47mm/px · z∈[-65,+75]mm · 2 of 25 slices shown]
[im 1/25]
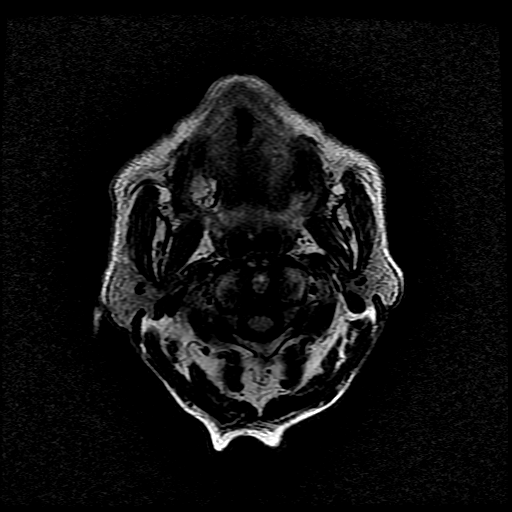
[im 25/25]
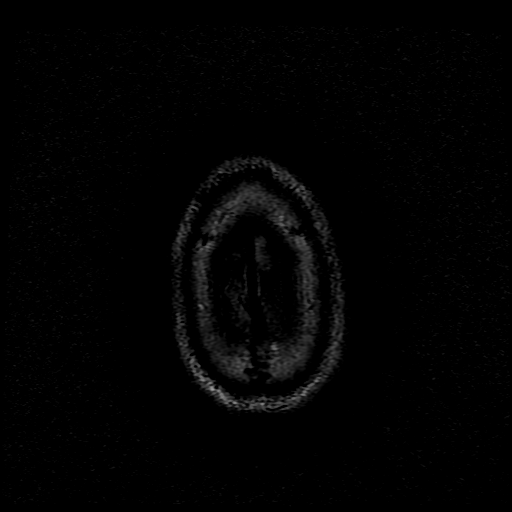

[Series 11: T1 post-contrast · axial · 3.0mm · 0.47mm/px · z∈[-67,+76]mm · 4 of 50 slices shown (1 of 3)]
[im 1/50]
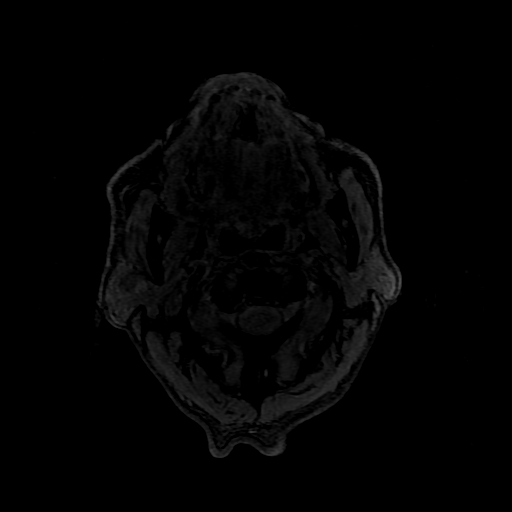
[im 17/50]
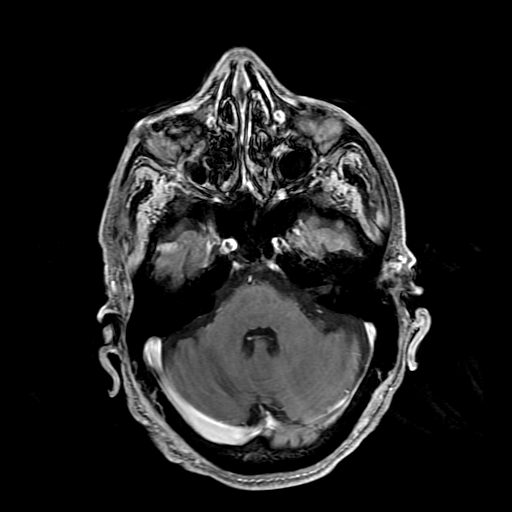
[im 33/50]
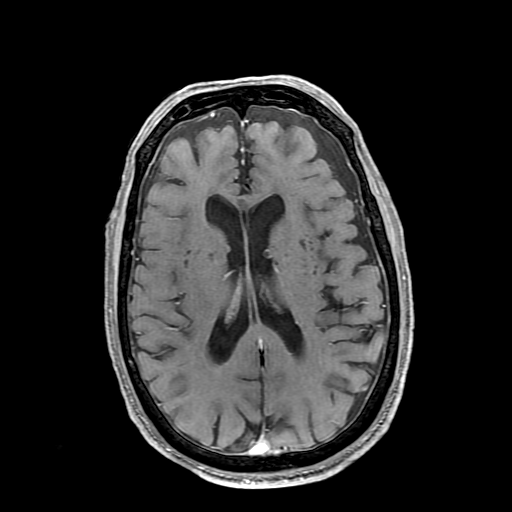
[im 50/50]
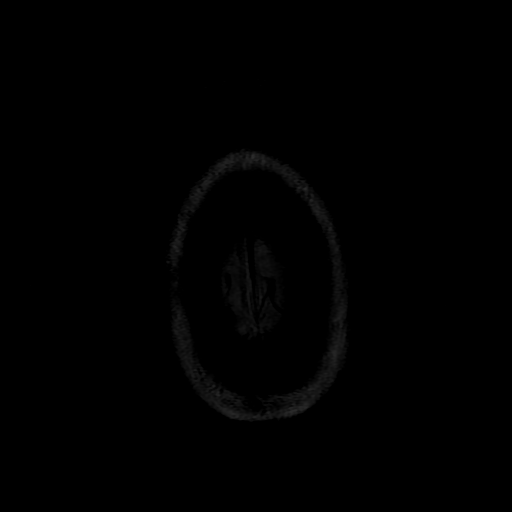

[Series 12: T1 post-contrast · coronal · 5.0mm · 0.39mm/px · 2 of 29 slices shown (2 of 3)]
[im 1/29]
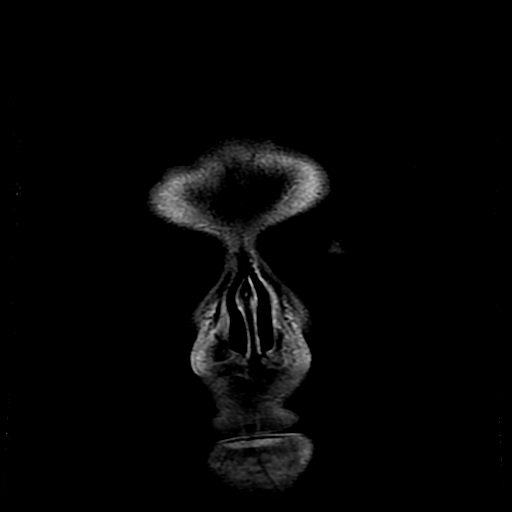
[im 29/29]
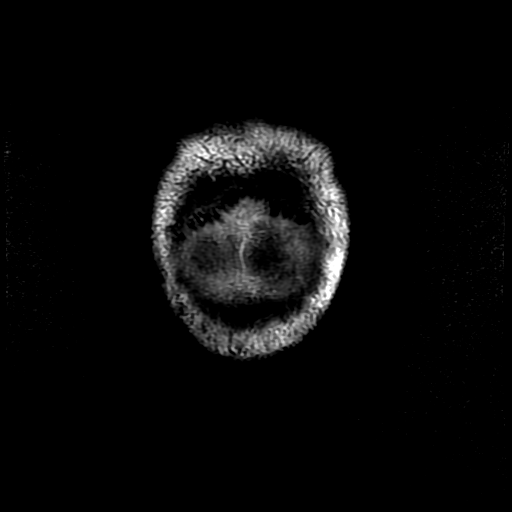

[Series 13: T1 post-contrast · sagittal · 5.0mm · 0.47mm/px · 2 of 24 slices shown (3 of 3)]
[im 1/24]
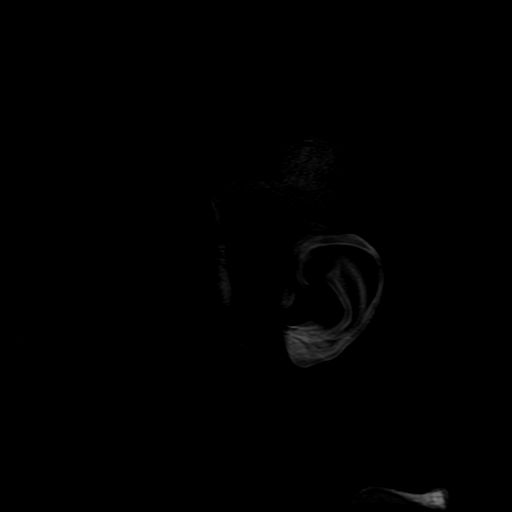
[im 24/24]
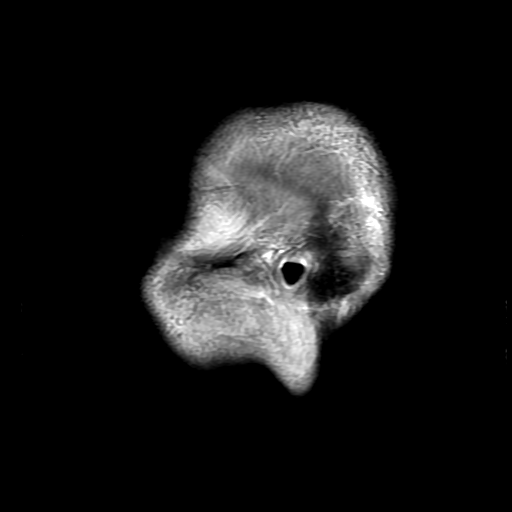

[Series 300: DWI · axial · 3.0mm · 1.09mm/px · z∈[-74,+68]mm · 4 of 50 slices shown (3 of 4)]
[im 1/50]
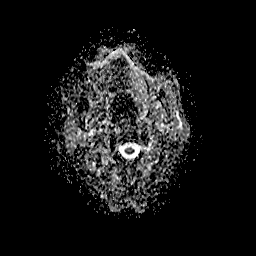
[im 17/50]
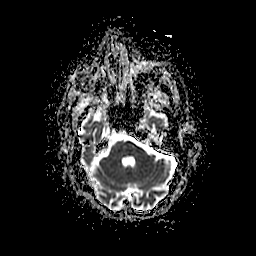
[im 33/50]
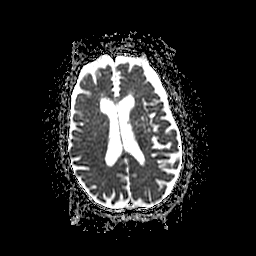
[im 50/50]
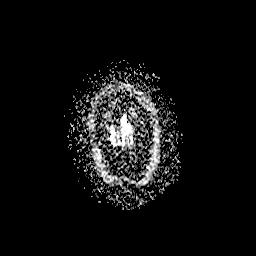

[Series 400: DWI · coronal · 5.0mm · 1.09mm/px · 3 of 38 slices shown (4 of 4)]
[im 1/38]
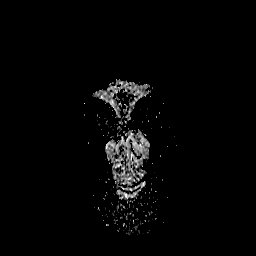
[im 19/38]
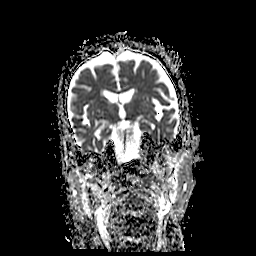
[im 38/38]
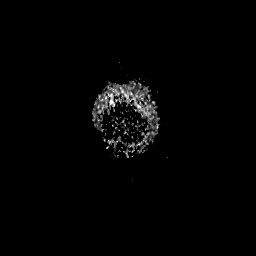

[34 of 48 positions shown; findings below may reference images not displayed]

FINDINGS: Brain: 2 adjacent enhancing lesions or 1 lobulated enhancing lesion
in the right thalamus, extending towards the right cerebral
peduncle. The more anterior lesion or portion measures 5 x 6 x 7 mm
(AP x TR x CC) (series 11, image 29 and series 13, image 10). The
more posterior lesion or portion measures 5 x 6 x 6 mm (series 11,
image 27 and series 13, image 10). Increased T2 signal surrounding
these lesions, likely edema, which extends into the right cerebral
peduncle and midbrain. No significant mass effect or midline shift.
No definite additional enhancing lesions.

No restricted diffusion to suggest acute or subacute infarct. No
acute hemorrhage. No hydrocephalus or extra-axial collection.
Cerebral volume is within normal limits for age.

Vascular: Normal flow voids.

Skull and upper cervical spine: Normal marrow signal.

Sinuses/Orbits: Mucous retention cysts in the maxillary sinuses.
Mild mucosal thickening in the ethmoid air cells. The orbits are
unremarkable.

Other: The mastoids are well aerated.
IMPRESSION: Lobulated enhancing lesion or 2 adjacent subcentimeter enhancing
lesions in the right thalamus with surrounding edema, concerning for
metastatic disease. No significant mass effect or midline shift.

## 2022-01-15 IMAGING — CT CT HEAD W/O CM
4 of 5 series · 15 of 47 positions shown, 17 images · non-contrast
Comparison: None.

CLINICAL DATA: Sudden onset headache



[Series 3: head wo · axial · 0.44mm/px · z∈[-84,+21]mm · 5 of 33 slices shown, 7 images]
[im 6/33  brain]
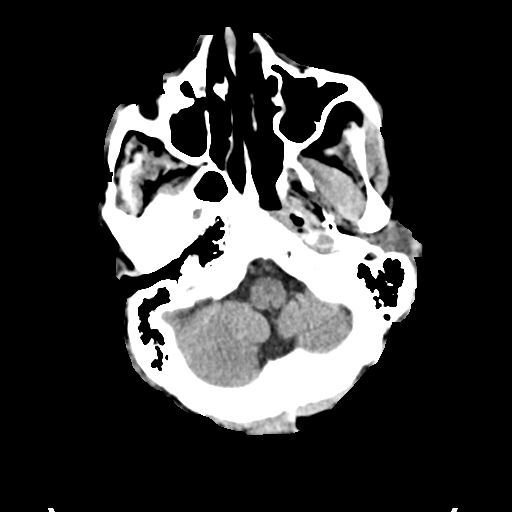
[im 6/33  bone]
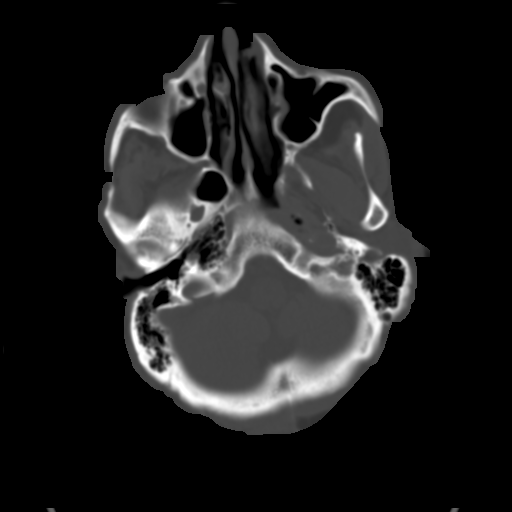
[im 11/33  brain]
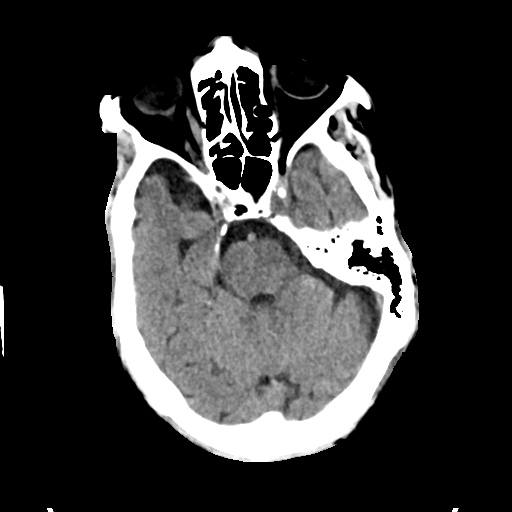
[im 17/33  brain]
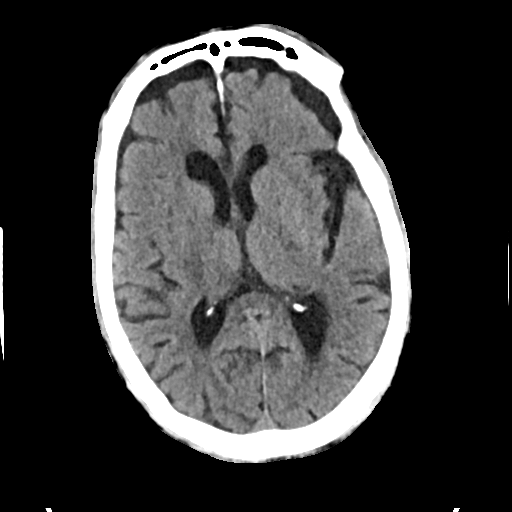
[im 22/33  brain]
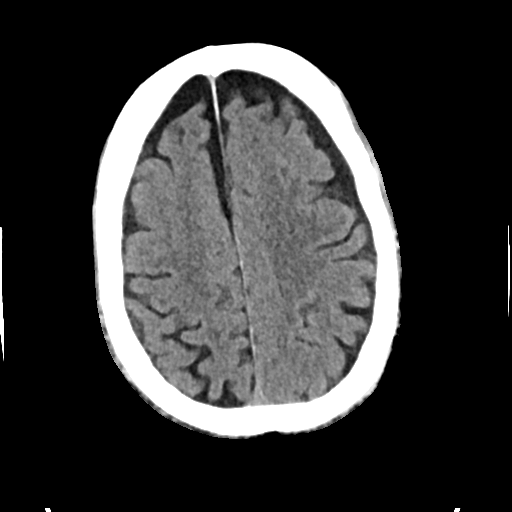
[im 27/33  brain]
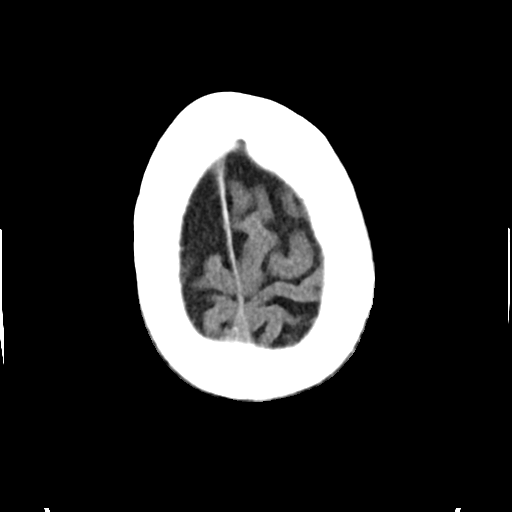
[im 27/33  bone]
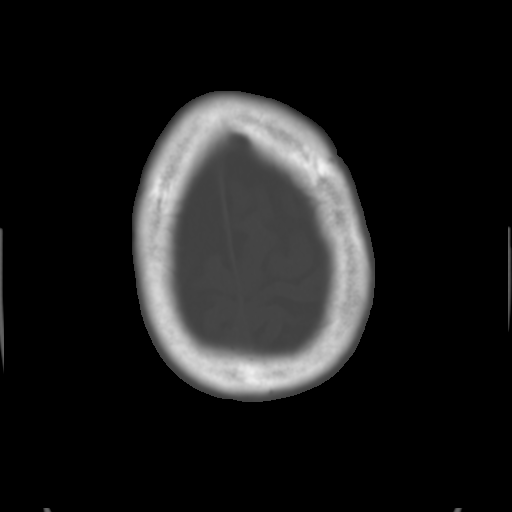

[Series 5: cor soft · coronal · 0.34mm/px · 3 of 74 slices shown]
[im 25/74  brain]
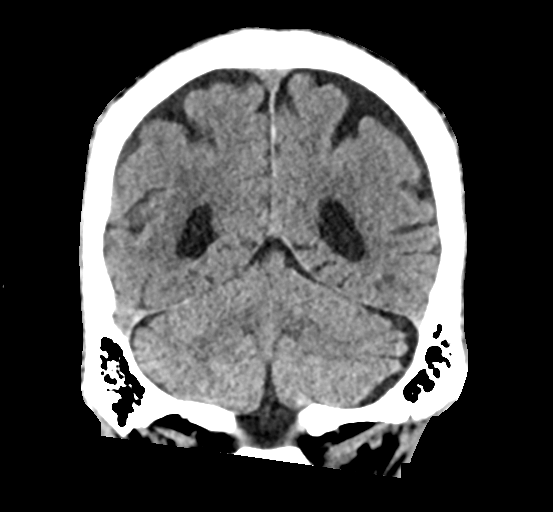
[im 33/74  brain]
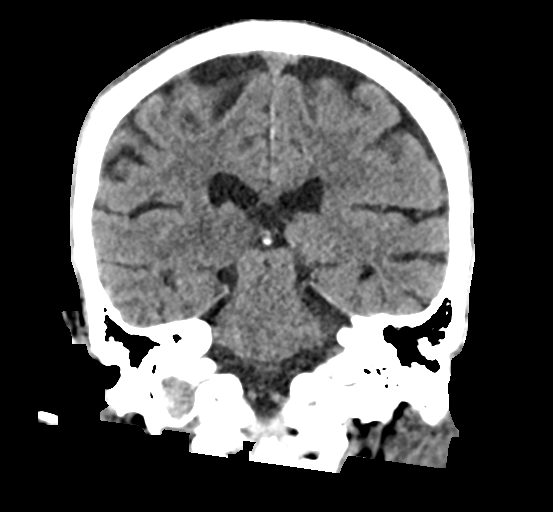
[im 41/74  brain]
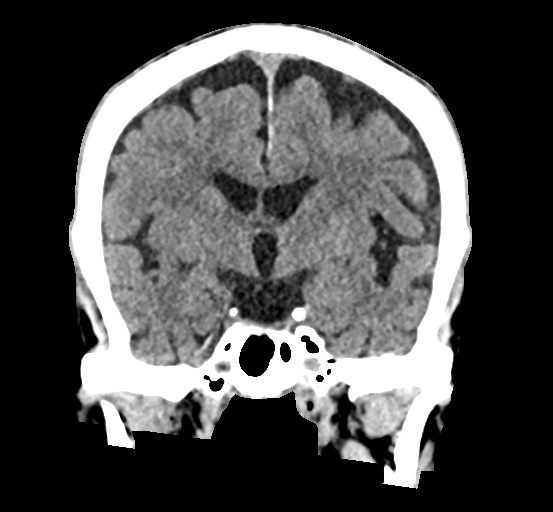

[Series 6: sag soft · sagittal · 0.31mm/px · 3 of 62 slices shown]
[im 22/62  brain]
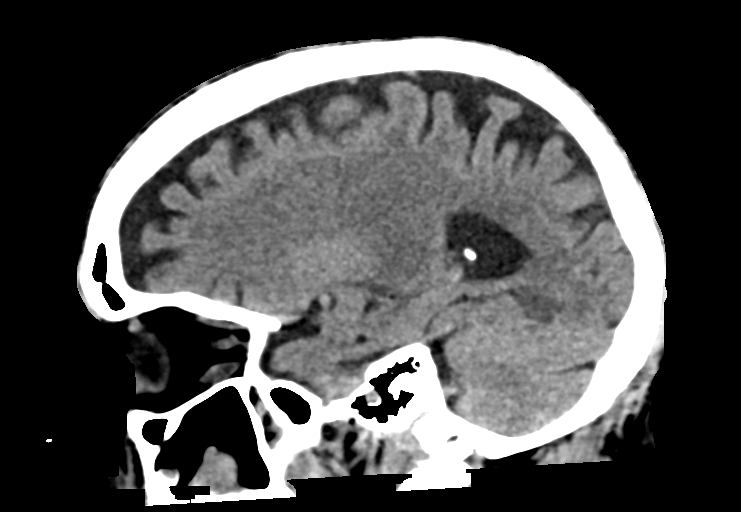
[im 30/62  brain]
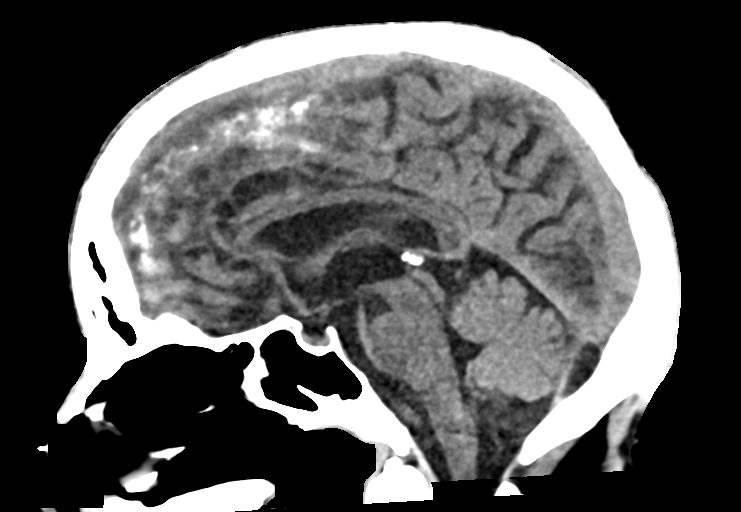
[im 38/62  brain]
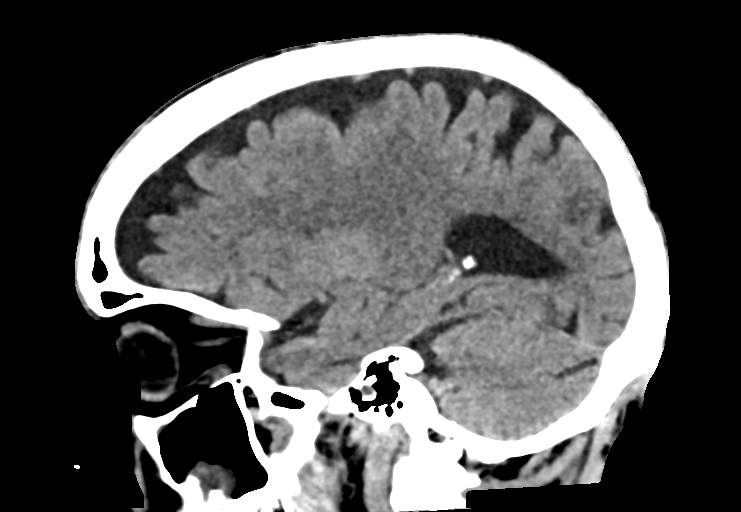

[Series 7: true axial · axial · 0.36mm/px · z∈[-97,-13]mm · 4 of 35 slices shown]
[im 6/35  brain]
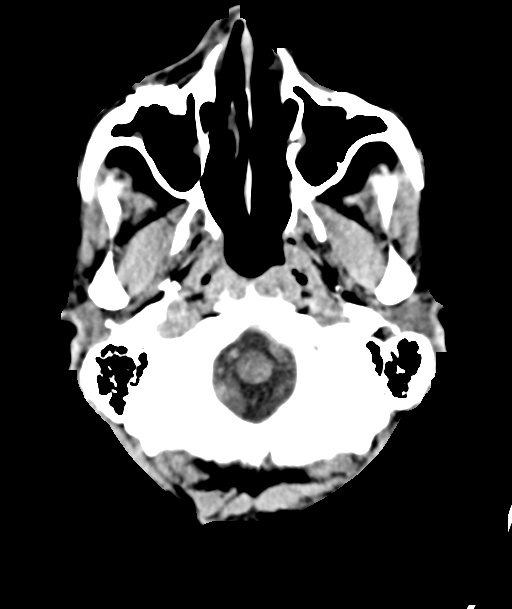
[im 12/35  brain]
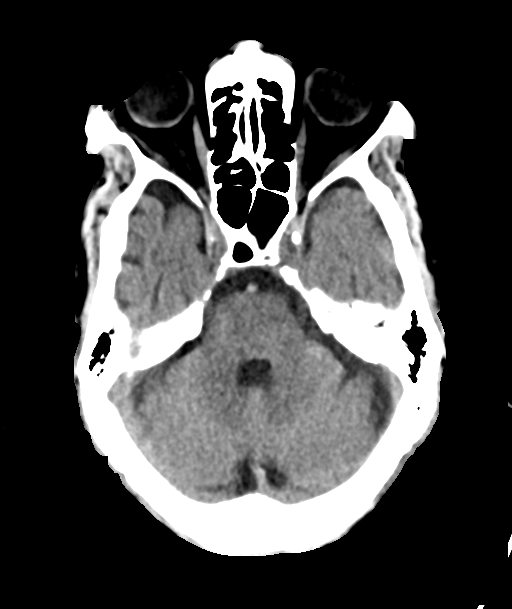
[im 18/35  brain]
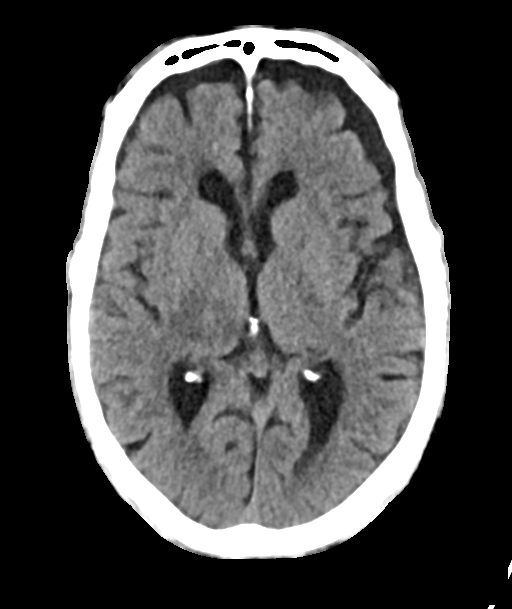
[im 23/35  brain]
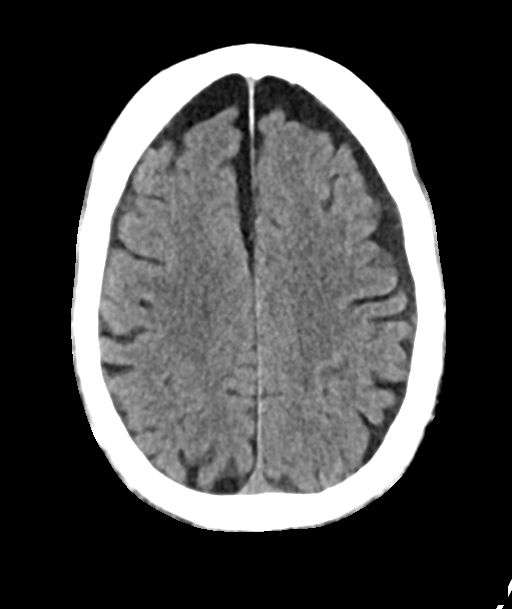

[15 of 47 positions shown; findings below may reference images not displayed]

FINDINGS: Brain: No hydrocephalus, extra-axial collection or mass lesion/mass
effect. There is a focal hyperdensity of the posterior right
thalamus and underlying posterior right cerebral pedicle measuring
1.0 x 0.5 cm (series 3, image 15) with a halo of surrounding
hypodense edema. Mild periventricular and deep white matter
hypodensity.

Vascular: No hyperdense vessel or unexpected calcification.

Skull: Normal. Negative for fracture or focal lesion.

Sinuses/Orbits: No acute finding.

Other: None.
IMPRESSION: 1. There is a focal hyperdensity of the posterior right thalamus and
underlying posterior right cerebral pedicle measuring 1.0 x 0.5 cm
with a halo of surrounding hypodense edema. This finding is
concerning for a small acute to subacute intraparenchymal
hemorrhage.

2.  Small-vessel white matter disease.

These results were called by telephone at the time of interpretation
on [DATE] at [DATE] to Dr. ALTUS , who verbally
acknowledged these results.

## 2022-01-15 MED ORDER — MAGNESIUM SULFATE 2 GM/50ML IV SOLN
2.0000 g | Freq: Once | INTRAVENOUS | Status: AC
  Administered 2022-01-15: 2 g via INTRAVENOUS
  Filled 2022-01-15: qty 50

## 2022-01-15 MED ORDER — EMPTY CONTAINERS FLEXIBLE MISC
900.0000 mg | Freq: Once | Status: DC
  Filled 2022-01-15: qty 90

## 2022-01-15 MED ORDER — GADOBUTROL 1 MMOL/ML IV SOLN
5.5000 mL | Freq: Once | INTRAVENOUS | Status: AC | PRN
  Administered 2022-01-15: 5.5 mL via INTRAVENOUS

## 2022-01-15 NOTE — Progress Notes (Signed)
Occupational Therapy Treatment Patient Details Name: Robert Hamilton MRN: [id number] DOB: 1934-09-30 Today's Date: 01/15/2022   History of present illness Pt is a 88 y.o. M who presents 01/12/2022 with increasing fatigue, generalized weakness, and near syncope. Work up suggestive of possible PNA with parapneumonic effusion. 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 Pt continues to present with decreased activity tolerance and generalized deconditioning. Pt greeted on toilet having already completed posterior pericare. Pt completed ambulatory transfer back to EOB with rw and min guard assist. Pt completed multiple stands from EOB with supervision to adjust bed pads. Pt on RA during session with SpO2 >98%.  Pt would continue to benefit from skilled occupational therapy while admitted and after d/c to address the below listed limitations in order to improve overall functional mobility and facilitate independence with BADL participation. DC plan remains appropriate, will follow acutely per POC.      Recommendations for follow up therapy are one component of a multi-disciplinary discharge planning process, led by the attending physician.  Recommendations may be updated based on patient status, additional functional criteria and insurance authorization.    Follow Up Recommendations  No OT follow up    Assistance Recommended at Discharge Intermittent Supervision/Assistance  Patient can return home with the following  Assistance with cooking/housework;Direct supervision/assist for medications management   Equipment Recommendations  None recommended by OT     Recommendations for Other Services      Precautions / Restrictions Precautions Precautions: Fall Precaution Comments: monitor SpO2 Restrictions Weight Bearing Restrictions: No       Mobility Bed Mobility Overal bed mobility: Modified Independent                  Transfers Overall transfer level: Needs assistance Equipment used: Rolling walker (2 wheels) Transfers: Sit to/from Stand Sit to Stand: Supervision           General transfer comment: supervision to rise from toilet and EOB mobility times to change bed pads     Balance Overall balance assessment: Needs assistance Sitting-balance support: No upper extremity supported, Feet supported Sitting balance-Leahy Scale: Fair     Standing balance support: No upper extremity supported, During functional activity Standing balance-Leahy Scale: Fair Standing balance comment: standing statically at EOB while OTA untangled wires                           ADL either performed or assessed with clinical judgement   ADL Overall ADL's : Needs assistance/impaired                         Toilet Transfer: Min guard;Ambulation;Rolling walker (2 wheels) Toilet Transfer Details (indicate cue type and reason): min guard for safety and line mgmt Toileting- Clothing Manipulation and Hygiene: Supervision/safety;Sitting/lateral lean       Functional mobility during ADLs: Min guard;Rolling walker (2 wheels) General ADL Comments: pt continues to present with decreased activity tolerance and generalized deconditioning    Extremity/Trunk Assessment Upper Extremity Assessment Upper Extremity Assessment: Generalized weakness   Lower Extremity Assessment Lower Extremity Assessment: Defer to PT evaluation   Cervical /  Trunk Assessment Cervical / Trunk Assessment: Normal    Vision Baseline Vision/History: 0 No visual deficits Patient Visual Report: No change from baseline     Perception  Perception Perception: Within Functional Limits   Praxis Praxis Praxis: Intact    Cognition Arousal/Alertness: Awake/alert Behavior During Therapy: WFL for tasks assessed/performed Overall Cognitive Status: No family/caregiver present to determine baseline cognitive functioning                                 General Comments: overall WFL for mobility tasks, recalling nursing information and remembering to call for help after completing toileting        Exercises      Shoulder Instructions       General Comments on RA with SpO2 98%, +BM/urine void during session    Pertinent Vitals/ Pain       Pain Assessment Pain Assessment: No/denies pain  Home Living                                          Prior Functioning/Environment              Frequency  Min 2X/week        Progress Toward Goals  OT Goals(current goals can now be found in the care plan section)  Progress towards OT goals: Progressing toward goals  Acute Rehab OT Goals Patient Stated Goal: none stated Time For Goal Achievement: 01/27/22 Potential to Achieve Goals: Good  Plan Discharge plan remains appropriate;Frequency remains appropriate    Co-evaluation                 AM-PAC OT "6 Clicks" Daily Activity     Outcome Measure   Help from another person eating meals?: None Help from another person taking care of personal grooming?: None Help from another person toileting, which includes using toliet, bedpan, or urinal?: A Little Help from another person bathing (including washing, rinsing, drying)?: A Little Help from another person to put on and taking off regular upper body clothing?: None Help from another person to put on and taking off regular lower body clothing?: A Little 6 Click Score: 21    End of Session Equipment Utilized During Treatment: Rolling walker (2 wheels)  OT Visit Diagnosis: Unsteadiness on feet (R26.81);Other abnormalities of  gait and mobility (R26.89);Muscle weakness (generalized) (M62.81);History of falling (Z91.81)   Activity Tolerance Patient tolerated treatment well   Patient Left in bed;with call bell/phone within reach;with bed alarm set   Nurse Communication Mobility status        Time: 1351-1406 OT Time Calculation (min): 15 min  Charges: OT General Charges $OT Visit: 1 Visit OT Treatments $Self Care/Home Management : 8-22 mins  Harley Alto., COTA/L Acute Rehabilitation Services [phone]   Precious Haws 01/15/2022, 2:15 PM

## 2022-01-15 NOTE — Consult Note (Signed)
NAME:  Robert Hamilton, MRN:  [id number], DOB:  06/08/1934, LOS: 3 ADMISSION DATE:  01/12/2022, CONSULTATION DATE:  2/14 REFERRING MD:  Candiss Norse, Reason for consult:  right thalamic head bleed   History of Present Illness:   Robert Hamilton, is a 88 y.o. male, who presented to the [facility] ED on 2/11 with a chief complaint of feeling lightheaded and tired.  They have a pertinent past medical history of Squamous cell lung cancer stage IVa on chemotherapy started on Keytruda with second cycle on January 01, 2022, right hip bone mets s/p palliative radiation, chronic respiratory failure 3L [place], chronic systolic CHF, HTN, anemia, GERD  He was found to have CAP and was admitted to the hospitalist service.  A CT head was obtained due to complaints of continued headache and it showed a small IPH in the in the posterior right thalamus.  PCCM was consulted for consideration for transfer to the ICU   Pertinent  Medical History  Squamous cell lung cancer stage IVa on chemotherapy started on Keytruda with second cycle on January 01, 2022, right hip bone mets s/p palliative radiation, COPD on 3L [place], chronic systolic CHF, HTN, anemia  Significant Hospital Events: Including procedures, antibiotic start and stop dates in addition to other pertinent events   2/11 Admit 2/14 CT head with small IPH in the in the posterior right thalamus.  Interim History / Subjective:  See above.  Subjective: Denies chest pain, endorses some SOB, denies bleeding, endorses small headache   Objective   Blood pressure 138/82, pulse 60, temperature 97.7 F (36.5 C), temperature source Oral, resp. rate 16, height 5\' 9"  (1.753 m), weight 55.6 kg, SpO2 100 %.        Intake/Output Summary (Last 24 hours) at 01/15/2022 1256 Last data filed at 01/15/2022 0200 Gross per 24 hour  Intake --  Output 500 ml  Net -500 ml   Filed Weights   01/12/22 2139  Weight: 55.6 kg    Examination: General: In bed, NAD, appears  comfortable HEENT: MM pink/moist, anicteric, atraumatic Neuro: RASS 0, PERRL 21mm, GCS 15, MAE, 5/5 strength  CV: S1S2, Afib, no m/r/g appreciated PULM:  clear in the upper lobes, clear in the lower lobes, trachea midline, chest expansion symmetric GI: soft, bsx4 active, non-tender   Extremities: warm/dry, no pretibial edema, capillary refill less than 3 seconds  Skin:  no rashes or lesions noted, port accessed   CT head small iph in the in the posterior right thalamus. Trop 11, BNP 249 MG 1.6 HGB 9.4>9.5   Resolved Hospital Problem list     Assessment & Plan:  IPH right posterior thalamus Headache Seen on 2/14 CT head. On eliquis. No neuro deficits on exam -Neurology consulted and following. Appreciate assistance with workup -Hold on reversal of eliquis. Discussed with Dr. Tacy Learn and Reeves Forth. -Obtain MRI of brain with and without -Goal SBP 120-140. Currently meeting goals. PRN labetalol for HTN not controlled with oral medications. -Continue neuroprotective measures- normothermia, euglycemia, HOB greater than 30, head in neutral alignment, normocapnia, normoxia.  -No indication to transfer to ICU at this time. -q2h neuro checks. Obtain head CT if neuro change ocurs. -Would hold further anticoagulation today. Defer to neuro.   CAP, POA Stage Iva squamous cell lung cancer, undergoing palliative chemo HX PE -Continue ceftriaxone/azithro -Follow up fluid cultures -Pulm toilet as able  -Hold eliquis -Follow fever wbc curve.  HX CHF Paroxsymal afib -continue BB and entresto -hold eliquis in setting of head bleed  DNR/DNI -Palliative care following, appreciate assistance   No indication to move to the ICU at this time. PCCM is available for further assistance if needed.  Best Practice (right click and "Reselect all SmartList Selections" daily)   Per primary  Labs   CBC: Recent Labs  Lab 01/12/22 1006 01/14/22 0358 01/15/22 0842  WBC 3.8* 5.9 6.1  NEUTROABS 2.5  4.3  --   HGB 11.0* 9.4* 9.5*  HCT 36.4* 31.6* 32.1*  MCV 94.1 92.9 94.1  PLT 177 198 833    Basic Metabolic Panel: Recent Labs  Lab 01/12/22 1006 01/14/22 0358 01/15/22 0842  NA 137 139 138  K 3.7 4.6 3.6  CL 107 109 108  CO2 23 24 22   GLUCOSE 91 123* 107*  BUN 9 21 17   CREATININE 0.94 1.16 1.09  CALCIUM 8.2* 9.0 9.0  MG  --  1.7 1.6*   GFR: Estimated Creatinine Clearance: 39 mL/min (by C-G formula based on SCr of 1.09 mg/dL). Recent Labs  Lab 01/12/22 1006 01/14/22 0358 01/14/22 1024 01/15/22 0842  PROCALCITON  --   --  <0.10  --   WBC 3.8* 5.9  --  6.1  LATICACIDVEN 1.0  --   --   --     Liver Function Tests: Recent Labs  Lab 01/12/22 1006 01/14/22 0358  AST 11* 13*  ALT 7 7  ALKPHOS 98 92  BILITOT 0.7 0.2*  PROT 6.6 6.3*  ALBUMIN 2.9* 2.7*   No results for input(s): LIPASE, AMYLASE in the last 168 hours. No results for input(s): AMMONIA in the last 168 hours.  ABG    Component Value Date/Time   PHART 7.469 (H) 06/26/2021 1003   PCO2ART 30.6 (L) 06/26/2021 1003   PO2ART 77.9 (L) 06/26/2021 1003   HCO3 21.9 06/26/2021 1003   ACIDBASEDEF 0.8 06/26/2021 1003   O2SAT 95.2 06/26/2021 1003     Coagulation Profile: No results for input(s): INR, PROTIME in the last 168 hours.  Cardiac Enzymes: No results for input(s): CKTOTAL, CKMB, CKMBINDEX, TROPONINI in the last 168 hours.  HbA1C: No results found for: HGBA1C  CBG: No results for input(s): GLUCAP in the last 168 hours.  Review of Systems:   Positives in bold  Gen: fever, chills, weight change, fatigue, night sweats HEENT:  blurred vision, double vision, hearing loss, tinnitus, sinus congestion, rhinorrhea, sore throat, neck stiffness, dysphagia PULM:  shortness of breath, cough, sputum production, hemoptysis, wheezing CV: chest pain, edema, orthopnea, paroxysmal nocturnal dyspnea, palpitations GI:  abdominal pain, nausea, vomiting, diarrhea, hematochezia, melena, constipation, change in  bowel habits GU: dysuria, hematuria, polyuria, oliguria, urethral discharge Endocrine: hot or cold intolerance, polyuria, polyphagia or appetite change Derm: rash, dry skin, scaling or peeling skin change Heme: easy bruising, bleeding, bleeding gums Neuro: headache, numbness, weakness, slurred speech, loss of memory or consciousness   Past Medical History:  He,  has a past medical history of Anemia, Anemia (09/27/2021), Cancer ([place]), CHF (congestive heart failure) ([place]), Chronic kidney disease, COPD (chronic obstructive pulmonary disease) ([place]), Diverticulosis, GERD (gastroesophageal reflux disease), and Hypertension.   Surgical History:   Past Surgical History:  Procedure Laterality Date   CRYOTHERAPY  06/26/2021   Procedure: CRYOTHERAPY;  Surgeon: Garner Nash, DO;  Location: [facility];  Service: Pulmonary;;   HEMOSTASIS CONTROL  06/26/2021   Procedure: HEMOSTASIS CONTROL;  Surgeon: Garner Nash, DO;  Location: [facility];  Service: Pulmonary;;   HERNIA REPAIR     PROSTATE SURGERY     PROSTATECTOMY  VIDEO BRONCHOSCOPY Left 06/26/2021   Procedure: VIDEO BRONCHOSCOPY WITHOUT FLUORO;  Surgeon: Garner Nash, DO;  Location: [facility];  Service: Pulmonary;  Laterality: Left;  possible cryotherapy     Social History:   reports that he quit smoking about 13 months ago. His smoking use included cigarettes. He smoked an average of .25 packs per day. He has never used smokeless tobacco. He reports that he does not currently use alcohol. He reports that he does not use drugs.   Family History:  His family history includes Heart attack in his mother; Hypertension in his father, mother, sister, sister, sister, and sister.   Allergies Allergies  Allergen Reactions   Paclitaxel Shortness Of Breath     Home Medications  Prior to Admission medications   Medication Sig Start Date End Date Taking? Authorizing Provider  acetaminophen (TYLENOL) 325 MG tablet Take 2 tablets  (650 mg total) by mouth every 6 (six) hours as needed for mild pain or fever (or Fever >/= 101). 06/28/21  Yes Ghimire, Dante Gang, MD  albuterol (VENTOLIN HFA) 108 (90 Base) MCG/ACT inhaler 2 puffs every 6 (six) hours as needed. Shortness of breath 06/05/21  Yes [provider]  allopurinol (ZYLOPRIM) 100 MG tablet Take 100 mg by mouth daily. 04/26/21  Yes [provider]  dexamethasone (DECADRON) 4 MG tablet Take 5 tabs the day before and 5 tab the morning of chemotherapy, every 3 weeks, by mouth 09/17/21  Yes Dayton Scrape A, NP  ENTRESTO 24-26 MG TAKE 1 TABLET BY MOUTH TWICE DAILY 12/04/21  Yes Patwardhan, Manish J, MD  folic acid (FOLVITE) 1 MG tablet Take 1 mg by mouth daily. 02/23/19  Yes [provider]  metoprolol succinate (TOPROL-XL) 25 MG 24 hr tablet TAKE 1/2 TABLET(12.5 MG) BY MOUTH DAILY 11/05/21  Yes Patwardhan, Manish J, MD  ondansetron (ZOFRAN) 4 MG tablet Take 1 tablet (4 mg total) by mouth every 4 (four) hours as needed for nausea. 09/17/21  Yes Dayton Scrape A, NP  Oxycodone HCl 10 MG TABS Take 1 tablet (10 mg total) by mouth every 4 (four) hours as needed. 12/18/21  Yes Dayton Scrape A, NP  pantoprazole (PROTONIX) 40 MG tablet Take 40 mg by mouth daily. 07/16/19  Yes [provider]  prochlorperazine (COMPAZINE) 10 MG tablet Take 1 tablet (10 mg total) by mouth every 6 (six) hours as needed for nausea or vomiting. 09/17/21  Yes Dayton Scrape A, NP  thiamine (VITAMIN B-1) 100 MG tablet Take 100 mg by mouth daily.   Yes [provider]  APIXABAN Arne Cleveland) VTE STARTER PACK (10MG  AND 5MG ) Take as directed on package: start with two-5mg  tablets twice daily for 7 days. On day 8, switch to one-5mg  tablet twice daily. Patient not taking: Reported on 01/12/2022 06/28/21   Barb Merino, MD  diclofenac (VOLTAREN) 50 MG EC tablet Take 50 mg by mouth 3 (three) times daily. Patient not taking: Reported on 12/27/2021 04/21/21   [provider]   gabapentin (NEURONTIN) 300 MG capsule Take 300 mg by mouth 3 (three) times daily. Patient not taking: Reported on 12/27/2021 10/08/21   [provider]  losartan (COZAAR) 50 MG tablet Take by mouth. Patient not taking: Reported on 12/27/2021 07/22/21   [provider]  potassium chloride SA (KLOR-CON M) 20 MEQ tablet TAKE 1 TABLET BY MOUTH ONCE DAILY Patient not taking: Reported on 01/12/2022 11/16/21   Dayton Scrape A, NP  predniSONE (STERAPRED UNI-PAK 21 TAB) 5 MG (21) TBPK tablet See  admin instructions. Patient not taking: Reported on 09/27/2021 06/05/21   [provider]     Critical care time: n/a    [employer] MSN, APRN, AGACNP-BC [facility]  01/15/2022 , 1:45 PM  Please see [email or web address] for pager details  If no response, please call [phone] After hours, please call Elink at [phone]
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
## 2022-01-15 NOTE — Care Management Important Message (Signed)
Important Message  Patient Details  Name: Robert Hamilton MRN: [id number] Date of Birth: 1934-05-11   Medicare Important Message Given:  Yes     Orbie Pyo 01/15/2022, 2:20 PM

## 2022-01-15 NOTE — Progress Notes (Addendum)
PT Cancellation Note  Patient Details Name: Issiah Huffaker MRN: [id number] DOB: [birth year]   Cancelled Treatment:    Reason Eval/Treat Not Completed: (P) Patient at procedure or test/unavailable pt off floor for MRI (4pm). Attempted again 5:30pm pt not back from MRI. Will continue efforts per PT plan of care as schedule permits.   Mercy Leppla M Kiarrah Rausch 01/15/2022, 4:05 PM
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
## 2022-01-16 ENCOUNTER — Encounter: Payer: Self-pay | Admitting: Oncology

## 2022-01-16 ENCOUNTER — Inpatient Hospital Stay (HOSPITAL_COMMUNITY): Payer: Medicare Other

## 2022-01-16 ENCOUNTER — Other Ambulatory Visit (HOSPITAL_COMMUNITY): Payer: Self-pay

## 2022-01-16 LAB — BASIC METABOLIC PANEL
Anion gap: 8 (ref 5–15)
BUN: 14 mg/dL (ref 8–23)
CO2: 23 mmol/L (ref 22–32)
Calcium: 8.7 mg/dL — ABNORMAL LOW (ref 8.9–10.3)
Chloride: 108 mmol/L (ref 98–111)
Creatinine, Ser: 1.05 mg/dL (ref 0.61–1.24)
GFR, Estimated: >60 mL/min (ref >60)
Glucose, Bld: 92 mg/dL (ref 70–99)
Potassium: 4 mmol/L (ref 3.5–5.1)
Sodium: 139 mmol/L (ref 135–145)

## 2022-01-16 LAB — CBC
HCT: 30.7 % — ABNORMAL LOW (ref 39.0–52.0)
Hemoglobin: 9.3 g/dL — ABNORMAL LOW (ref 13.0–17.0)
MCH: 28.1 pg (ref 26.0–34.0)
MCHC: 30.3 g/dL (ref 30.0–36.0)
MCV: 92.7 fL (ref 80.0–100.0)
Platelets: 214 10*3/uL (ref 150–400)
RBC: 3.31 MIL/uL — ABNORMAL LOW (ref 4.22–5.81)
RDW: 14.8 % (ref 11.5–15.5)
WBC: 5.9 10*3/uL (ref 4.0–10.5)
nRBC: 0 % (ref 0.0–0.2)

## 2022-01-16 LAB — MAGNESIUM: Magnesium: 2 mg/dL (ref 1.7–2.4)

## 2022-01-16 IMAGING — CT CT HEAD W/O CM
4 of 5 series · 15 of 47 positions shown, 17 images · non-contrast
Comparison: [DATE]

CLINICAL DATA: Stroke



[Series 3: head without · axial · non-contrast · 0.44mm/px · z∈[-98,+12]mm · 5 of 34 slices shown, 7 images]
[im 6/34  brain]
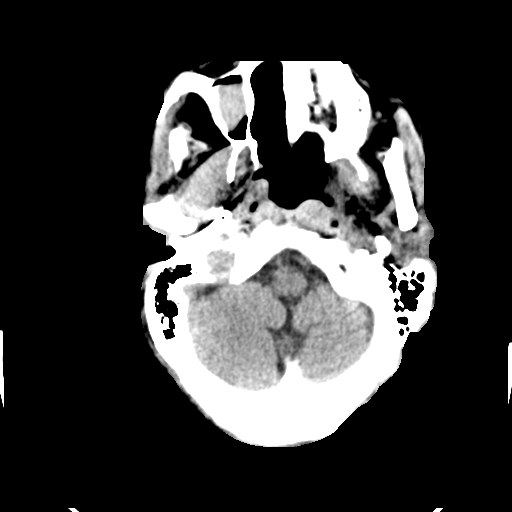
[im 6/34  bone]
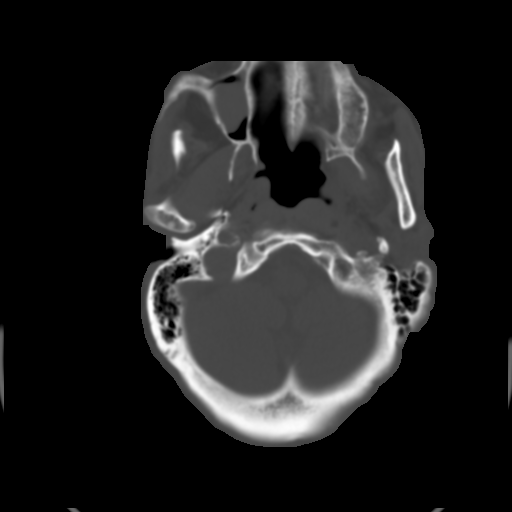
[im 12/34  brain]
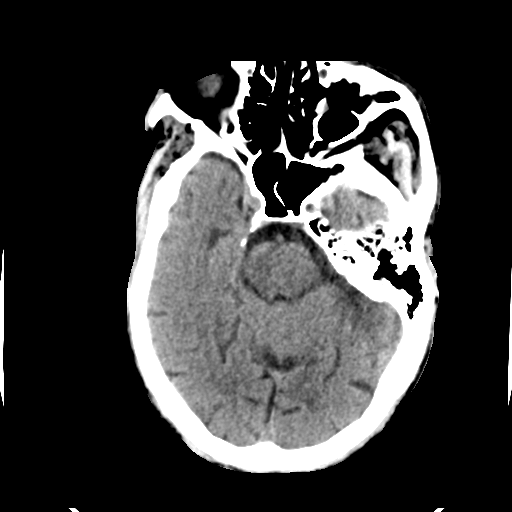
[im 17/34  brain]
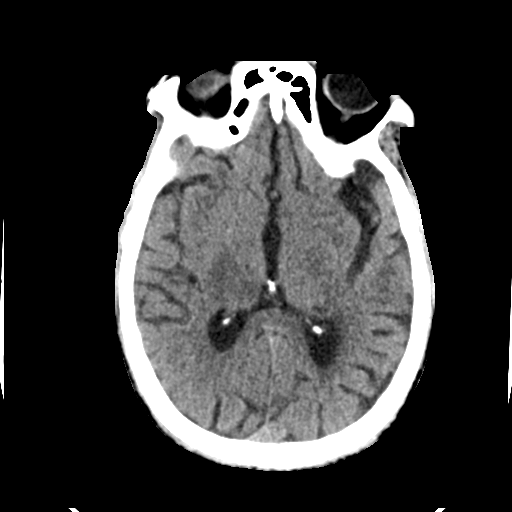
[im 23/34  brain]
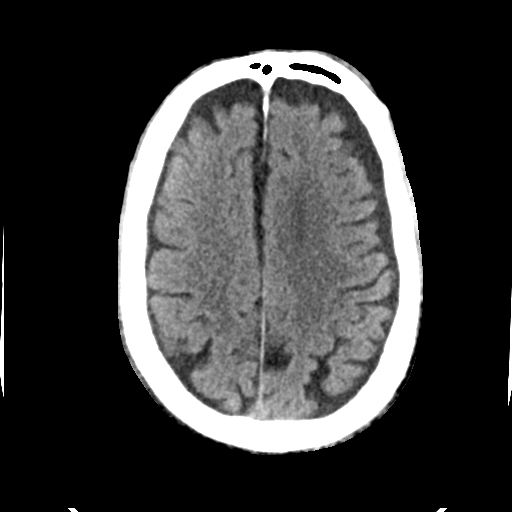
[im 28/34  brain]
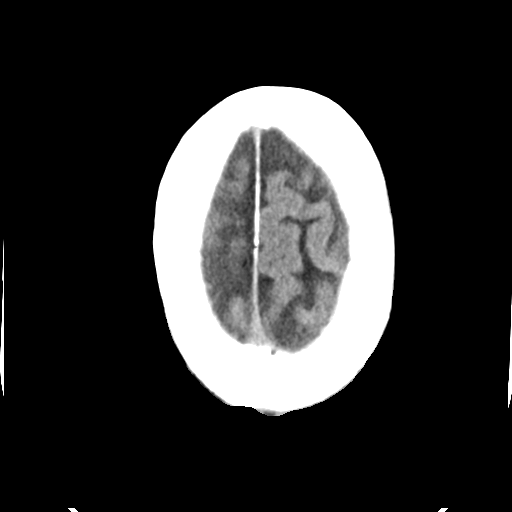
[im 28/34  bone]
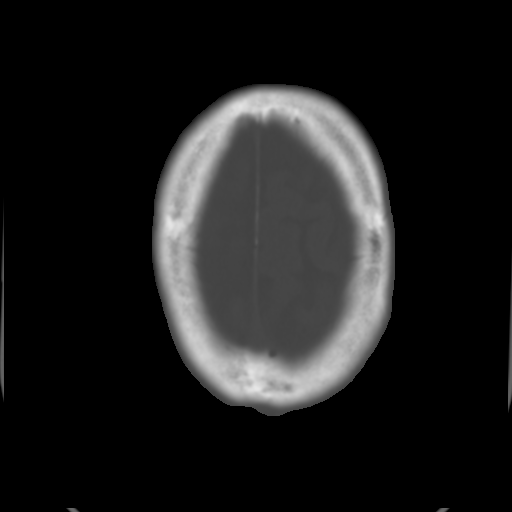

[Series 5: head without ax · axial · non-contrast · 0.33mm/px · z∈[-85,+1]mm · 4 of 36 slices shown]
[im 6/36  brain]
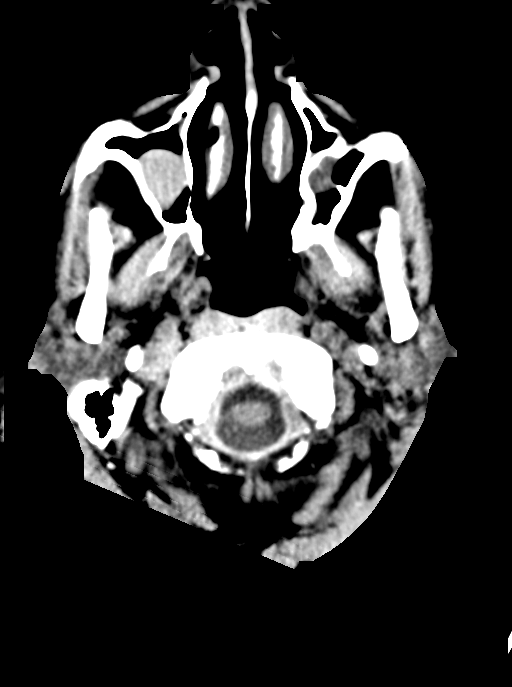
[im 12/36  brain]
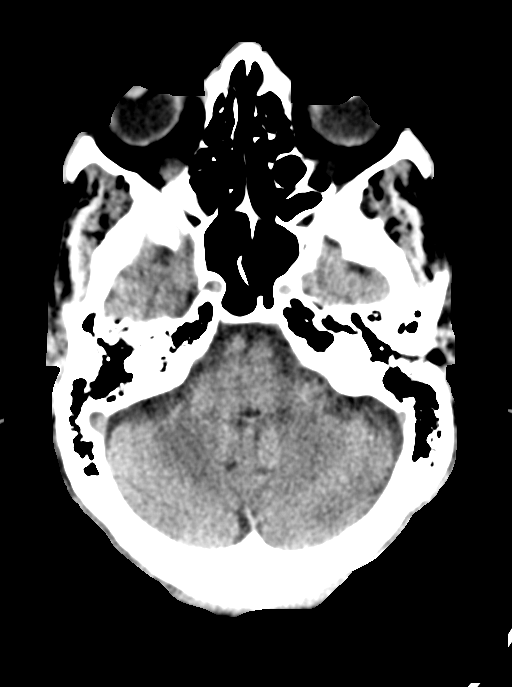
[im 18/36  brain]
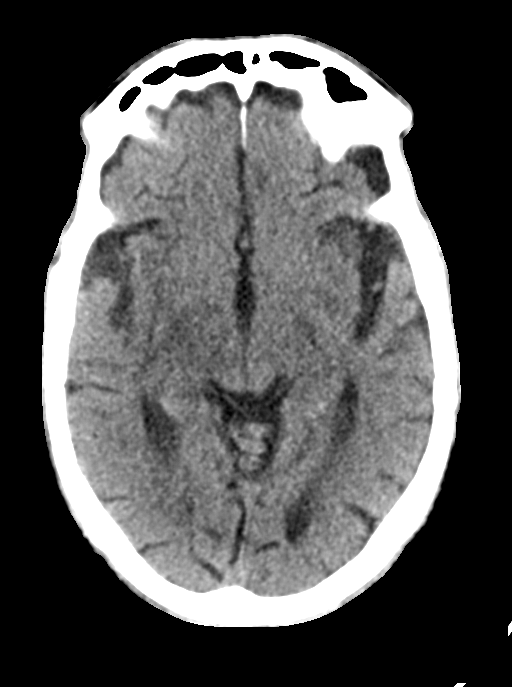
[im 24/36  brain]
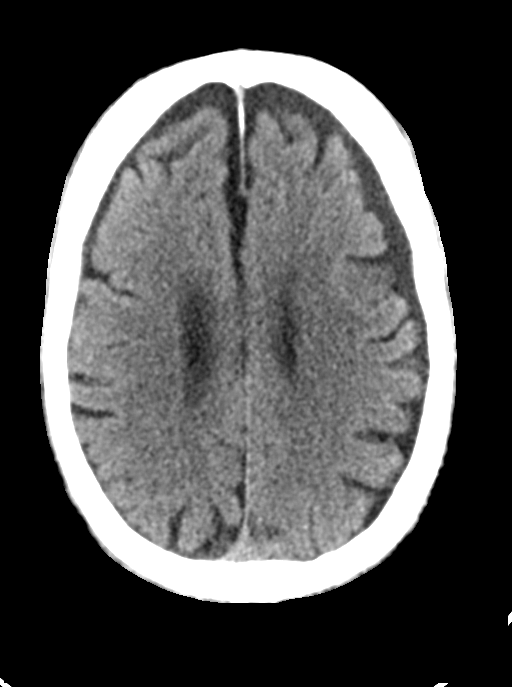

[Series 6: head without cor · coronal · non-contrast · 0.34mm/px · 3 of 70 slices shown]
[im 24/70  brain]
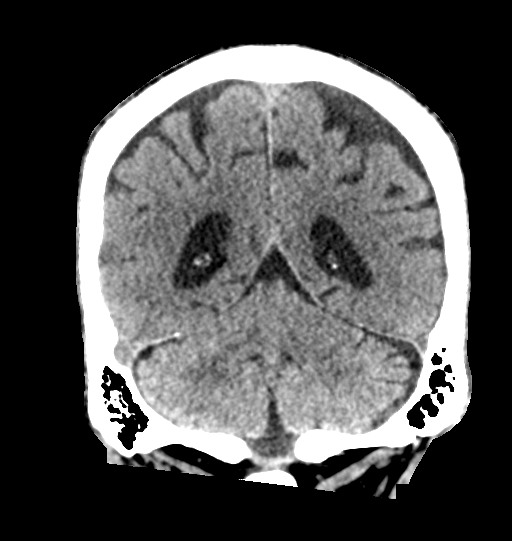
[im 31/70  brain]
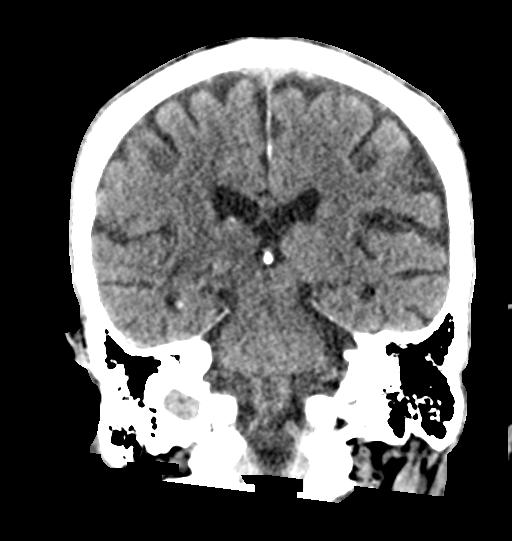
[im 39/70  brain]
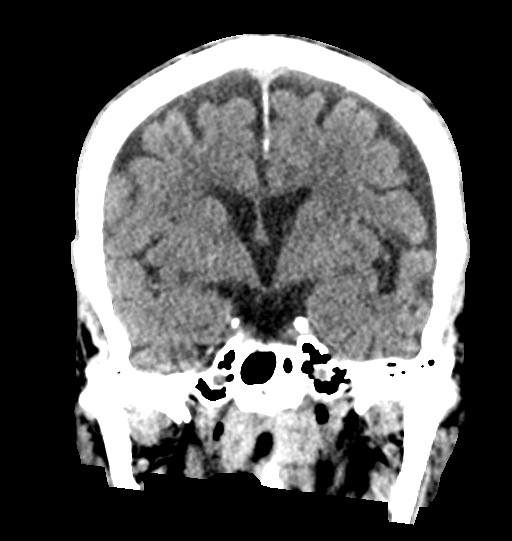

[Series 7: head without sag · sagittal · non-contrast · 0.35mm/px · 3 of 53 slices shown]
[im 18/53  brain]
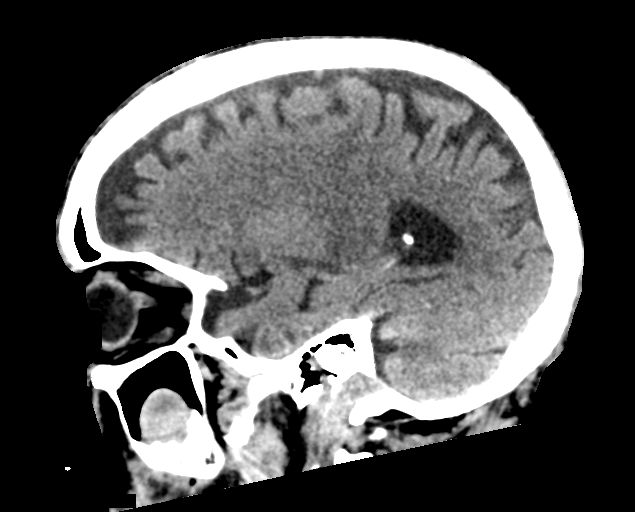
[im 27/53  brain]
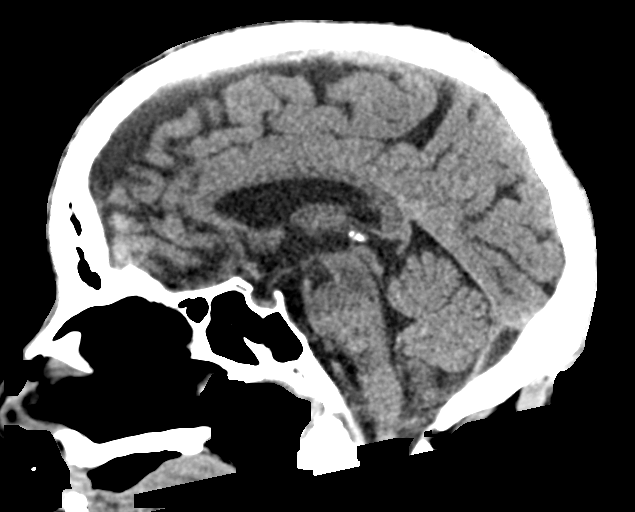
[im 35/53  brain]
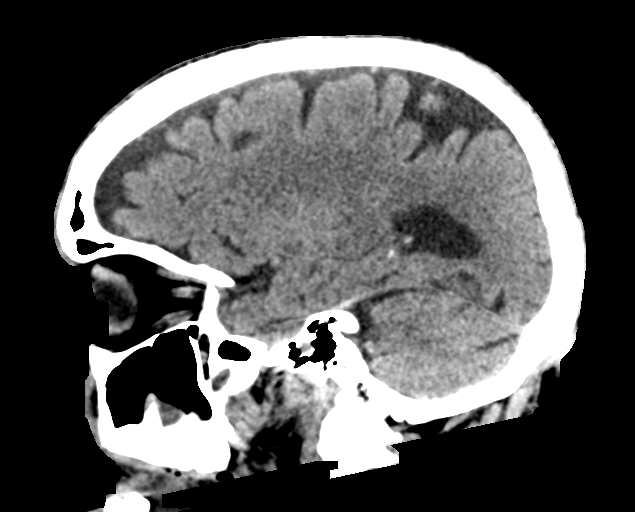

[15 of 47 positions shown; findings below may reference images not displayed]

FINDINGS: Brain: There is no mass, hemorrhage or extra-axial collection. There
is generalized atrophy without lobar predilection. Hypodensity of
the white matter is most commonly associated with chronic
microvascular disease.

Vascular: No abnormal hyperdensity of the major intracranial
arteries or dural venous sinuses. No intracranial atherosclerosis.

Skull: The visualized skull base, calvarium and extracranial soft
tissues are normal.

Sinuses/Orbits: No fluid levels or advanced mucosal thickening of
the visualized paranasal sinuses. No mastoid or middle ear effusion.
The orbits are normal.
IMPRESSION: 1. No acute intracranial abnormality.
2. Chronic microvascular ischemia and generalized atrophy.

## 2022-01-16 MED ORDER — LEVOFLOXACIN 500 MG PO TABS
500.0000 mg | ORAL_TABLET | Freq: Every day | ORAL | 0 refills | Status: AC
Start: 2022-01-16 — End: 2022-01-26
  Filled 2022-01-16: qty 3, 3d supply, fill #0

## 2022-01-16 NOTE — Care Management (Signed)
11:47 01/16/22 Post DC note  Patient discharged by 10am, with HH order prior to being seen by CM. Spoke w daughter over the phone to offer [facility] services.  She states that she will discuss it with her father and call me back.     Watson,Phyllis (Daughter)  [phone]

## 2022-01-16 NOTE — Plan of Care (Signed)
Problem: Activity: Goal: Ability to tolerate increased activity will improve 01/16/2022 0959 by Myriam Jacobson, RN Outcome: Completed/Met 01/16/2022 0959 by Myriam Jacobson, RN Outcome: Progressing   Problem: Clinical Measurements: Goal: Ability to maintain a body temperature in the normal range will improve 01/16/2022 0959 by Myriam Jacobson, RN Outcome: Completed/Met 01/16/2022 0959 by Myriam Jacobson, RN Outcome: Progressing   Problem: Respiratory: Goal: Ability to maintain adequate ventilation will improve 01/16/2022 0959 by Myriam Jacobson, RN Outcome: Completed/Met 01/16/2022 0959 by Myriam Jacobson, RN Outcome: Progressing Goal: Ability to maintain a clear airway will improve 01/16/2022 0959 by Myriam Jacobson, RN Outcome: Completed/Met 01/16/2022 0959 by Myriam Jacobson, RN Outcome: Progressing

## 2022-01-16 NOTE — TOC Transition Note (Signed)
Post DC Note  Transition of Care [facility]) - CM/SW Discharge Note   Patient Details  Name: Robert Hamilton MRN: [id number] Date of Birth: 01-Mar-1934  Transition of Care [facility]) CM/SW Contact:  Carles Collet, RN Phone Number: 01/16/2022, 3:25 PM   Clinical Narrative:   Damaris Schooner w patient's daughter and they are agreeable to [facility] services, would like [facility], referral placed.           Patient Goals and CMS Choice        Discharge Placement                       Discharge Plan and Services                                     Social Determinants of Health (SDOH) Interventions     Readmission Risk Interventions No flowsheet data found.

## 2022-01-16 NOTE — Discharge Summary (Signed)
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 [facility]

## 2022-01-17 LAB — CULTURE, BLOOD (ROUTINE X 2)
Culture: NO GROWTH
Culture: NO GROWTH
Special Requests: ADEQUATE

## 2022-01-18 ENCOUNTER — Other Ambulatory Visit: Payer: Self-pay | Admitting: Oncology

## 2022-01-18 ENCOUNTER — Encounter: Payer: Self-pay | Admitting: Oncology

## 2022-01-18 ENCOUNTER — Inpatient Hospital Stay: Payer: Medicare Other | Attending: Hematology and Oncology

## 2022-01-18 ENCOUNTER — Inpatient Hospital Stay (INDEPENDENT_AMBULATORY_CARE_PROVIDER_SITE_OTHER): Payer: Medicare Other | Admitting: Oncology

## 2022-01-18 ENCOUNTER — Other Ambulatory Visit: Payer: Self-pay

## 2022-01-18 VITALS — BP 135/88 | HR 110 | Temp 98.0°F | Resp 16 | Ht 69.0 in | Wt 123.6 lb

## 2022-01-18 DIAGNOSIS — C7951 Secondary malignant neoplasm of bone: Secondary | ICD-10-CM | POA: Insufficient documentation

## 2022-01-18 DIAGNOSIS — C3492 Malignant neoplasm of unspecified part of left bronchus or lung: Secondary | ICD-10-CM | POA: Diagnosis not present

## 2022-01-18 DIAGNOSIS — D6481 Anemia due to antineoplastic chemotherapy: Secondary | ICD-10-CM | POA: Insufficient documentation

## 2022-01-18 DIAGNOSIS — Z5112 Encounter for antineoplastic immunotherapy: Secondary | ICD-10-CM | POA: Diagnosis not present

## 2022-01-18 DIAGNOSIS — Z79899 Other long term (current) drug therapy: Secondary | ICD-10-CM | POA: Insufficient documentation

## 2022-01-18 DIAGNOSIS — D649 Anemia, unspecified: Secondary | ICD-10-CM | POA: Diagnosis not present

## 2022-01-18 LAB — BASIC METABOLIC PANEL
BUN: 13 (ref 4–21)
CO2: 23 — AB (ref 13–22)
Chloride: 110 — AB (ref 99–108)
Creatinine: 1 (ref 0.6–1.3)
Glucose: 102
Potassium: 3.9 (ref 3.4–5.3)
Sodium: 141 (ref 137–147)

## 2022-01-18 LAB — HEPATIC FUNCTION PANEL
ALT: 13 (ref 10–40)
AST: 22 (ref 14–40)
Alkaline Phosphatase: 103 (ref 25–125)
Bilirubin, Total: 0.4

## 2022-01-18 LAB — CULTURE, BODY FLUID W GRAM STAIN -BOTTLE: Culture: NO GROWTH

## 2022-01-18 LAB — CBC AND DIFFERENTIAL
HCT: 31 — AB (ref 41–53)
Hemoglobin: 9.8 — AB (ref 13.5–17.5)
Neutrophils Absolute: 3.16
Platelets: 218 (ref 150–399)
WBC: 5.1

## 2022-01-18 LAB — COMPREHENSIVE METABOLIC PANEL
Albumin: 3.6 (ref 3.5–5.0)
Calcium: 8.8 (ref 8.7–10.7)

## 2022-01-18 LAB — TSH: TSH: 4.558 u[IU]/mL — ABNORMAL HIGH (ref 0.350–4.500)

## 2022-01-18 LAB — CBC: RBC: 3.44 — AB (ref 3.87–5.11)

## 2022-01-18 MED ORDER — DEXAMETHASONE 4 MG PO TABS: ORAL_TABLET | ORAL | 1 refills | Status: AC

## 2022-01-20 LAB — T4: T4, Total: 6.9 ug/dL (ref 4.5–12.0)

## 2022-01-21 ENCOUNTER — Encounter: Payer: Self-pay | Admitting: Oncology

## 2022-01-21 DIAGNOSIS — I1310 Hypertensive heart and chronic kidney disease without heart failure, with stage 1 through stage 4 chronic kidney disease, or unspecified chronic kidney disease: Secondary | ICD-10-CM | POA: Diagnosis not present

## 2022-01-21 DIAGNOSIS — E785 Hyperlipidemia, unspecified: Secondary | ICD-10-CM | POA: Diagnosis not present

## 2022-01-21 DIAGNOSIS — Z7689 Persons encountering health services in other specified circumstances: Secondary | ICD-10-CM | POA: Diagnosis not present

## 2022-01-21 DIAGNOSIS — J449 Chronic obstructive pulmonary disease, unspecified: Secondary | ICD-10-CM | POA: Diagnosis not present

## 2022-01-21 DIAGNOSIS — R7303 Prediabetes: Secondary | ICD-10-CM | POA: Diagnosis not present

## 2022-01-21 MED FILL — Zoledronic Acid Inj Conc For IV Infusion 4 MG/5ML: INTRAVENOUS | Qty: 3.75 | Status: AC

## 2022-01-21 MED FILL — Pembrolizumab IV Soln 100 MG/4ML (25 MG/ML): INTRAVENOUS | Qty: 8 | Status: AC

## 2022-01-22 ENCOUNTER — Inpatient Hospital Stay: Payer: Medicare Other

## 2022-01-22 ENCOUNTER — Other Ambulatory Visit: Payer: Self-pay

## 2022-01-22 VITALS — BP 126/78 | HR 85 | Temp 98.2°F | Resp 16 | Ht 69.0 in | Wt 127.1 lb

## 2022-01-22 DIAGNOSIS — Z5112 Encounter for antineoplastic immunotherapy: Secondary | ICD-10-CM | POA: Diagnosis not present

## 2022-01-22 DIAGNOSIS — C3492 Malignant neoplasm of unspecified part of left bronchus or lung: Secondary | ICD-10-CM | POA: Diagnosis not present

## 2022-01-22 DIAGNOSIS — D6481 Anemia due to antineoplastic chemotherapy: Secondary | ICD-10-CM | POA: Diagnosis not present

## 2022-01-22 DIAGNOSIS — Z79899 Other long term (current) drug therapy: Secondary | ICD-10-CM | POA: Diagnosis not present

## 2022-01-22 DIAGNOSIS — C7951 Secondary malignant neoplasm of bone: Secondary | ICD-10-CM | POA: Diagnosis not present

## 2022-01-22 MED ORDER — ZOLEDRONIC ACID 4 MG/5ML IV CONC
3.0000 mg | Freq: Once | INTRAVENOUS | Status: AC
  Administered 2022-01-22: 3 mg via INTRAVENOUS
  Filled 2022-01-22: qty 3.75

## 2022-01-22 MED ORDER — SODIUM CHLORIDE 0.9 % IV SOLN: Freq: Once | INTRAVENOUS | Status: AC

## 2022-01-22 MED ORDER — SODIUM CHLORIDE 0.9 % IV SOLN: Freq: Once | INTRAVENOUS | Status: DC

## 2022-01-22 MED ORDER — EPOETIN ALFA-EPBX 40000 UNIT/ML IJ SOLN
40000.0000 [IU] | Freq: Once | INTRAMUSCULAR | Status: AC
  Administered 2022-01-22: 40000 [IU] via SUBCUTANEOUS
  Filled 2022-01-22: qty 1

## 2022-01-22 MED ORDER — SODIUM CHLORIDE 0.9 % IV SOLN
200.0000 mg | Freq: Once | INTRAVENOUS | Status: AC
  Administered 2022-01-22: 200 mg via INTRAVENOUS
  Filled 2022-01-22: qty 8

## 2022-01-22 MED ORDER — SODIUM CHLORIDE 0.9% FLUSH
10.0000 mL | INTRAVENOUS | Status: DC | PRN
  Administered 2022-01-22: 10 mL

## 2022-01-22 MED ORDER — HEPARIN SOD (PORK) LOCK FLUSH 100 UNIT/ML IV SOLN
500.0000 [IU] | Freq: Once | INTRAVENOUS | Status: AC | PRN
  Administered 2022-01-22: 500 [IU]

## 2022-01-22 NOTE — Patient Instructions (Signed)
Epoetin Alfa injection What is this medication? EPOETIN ALFA (e POE e tin AL fa) helps your body make more red blood cells. This medicine is used to treat anemia caused by chronic kidney disease, cancer chemotherapy, or HIV-therapy. It may also be used before surgery if you have anemia. This medicine may be used for other purposes; ask your health care provider or pharmacist if you have questions. COMMON BRAND NAME(S): Epogen, Procrit, Retacrit What should I tell my care team before I take this medication? They need to know if you have any of these conditions: cancer heart disease high blood pressure history of blood clots history of stroke low levels of folate, iron, or vitamin B12 in the blood seizures an unusual or allergic reaction to erythropoietin, albumin, benzyl alcohol, hamster proteins, other medicines, foods, dyes, or preservatives pregnant or trying to get pregnant breast-feeding How should I use this medication? This medicine is for injection into a vein or under the skin. It is usually given by a health care professional in a hospital or clinic setting. If you get this medicine at home, you will be taught how to prepare and give this medicine. Use exactly as directed. Take your medicine at regular intervals. Do not take your medicine more often than directed. It is important that you put your used needles and syringes in a special sharps container. Do not put them in a trash can. If you do not have a sharps container, call your pharmacist or healthcare provider to get one. A special MedGuide will be given to you by the pharmacist with each prescription and refill. Be sure to read this information carefully each time. Talk to your pediatrician regarding the use of this medicine in children. While this drug may be prescribed for selected conditions, precautions do apply. Overdosage: If you think you have taken too much of this medicine contact a poison control center or emergency  room at once. NOTE: This medicine is only for you. Do not share this medicine with others. What if I miss a dose? If you miss a dose, take it as soon as you can. If it is almost time for your next dose, take only that dose. Do not take double or extra doses. What may interact with this medication? Interactions have not been studied. This list may not describe all possible interactions. Give your health care provider a list of all the medicines, herbs, non-prescription drugs, or dietary supplements you use. Also tell them if you smoke, drink alcohol, or use illegal drugs. Some items may interact with your medicine. What should I watch for while using this medication? Your condition will be monitored carefully while you are receiving this medicine. You may need blood work done while you are taking this medicine. This medicine may cause a decrease in vitamin B6. You should make sure that you get enough vitamin B6 while you are taking this medicine. Discuss the foods you eat and the vitamins you take with your health care professional. What side effects may I notice from receiving this medication? Side effects that you should report to your doctor or health care professional as soon as possible: allergic reactions like skin rash, itching or hives, swelling of the face, lips, or tongue seizures signs and symptoms of a blood clot such as breathing problems; changes in vision; chest pain; severe, sudden headache; pain, swelling, warmth in the leg; trouble speaking; sudden numbness or weakness of the face, arm or leg signs and symptoms of a stroke like  changes in vision; confusion; trouble speaking or understanding; severe headaches; sudden numbness or weakness of the face, arm or leg; trouble walking; dizziness; loss of balance or coordination Side effects that usually do not require medical attention (report to your doctor or health care professional if they continue or are  bothersome): chills cough dizziness fever headaches joint pain muscle cramps muscle pain nausea, vomiting pain, redness, or irritation at site where injected This list may not describe all possible side effects. Call your doctor for medical advice about side effects. You may report side effects to FDA at 1-800-FDA-1088. Where should I keep my medication? Keep out of the reach of children. Store in a refrigerator between 2 and 8 degrees C (36 and 46 degrees F). Do not freeze or shake. Throw away any unused portion if using a single-dose vial. Multi-dose vials can be kept in the refrigerator for up to 21 days after the initial dose. Throw away unused medicine. NOTE: This sheet is a summary. It may not cover all possible information. If you have questions about this medicine, talk to your doctor, pharmacist, or health care provider.  2022 Elsevier/Gold Standard (2017-07-22 00:00:00) Zoledronic Acid Injection (Hypercalcemia, Oncology) What is this medication? ZOLEDRONIC ACID (ZOE le dron ik AS id) slows calcium loss from bones. It high calcium levels in the blood from some kinds of cancer. It may be used in other people at risk for bone loss. This medicine may be used for other purposes; ask your health care provider or pharmacist if you have questions. COMMON BRAND NAME(S): Zometa What should I tell my care team before I take this medication? They need to know if you have any of these conditions: cancer dehydration dental disease kidney disease liver disease low levels of calcium in the blood lung or breathing disease (asthma) receiving steroids like dexamethasone or prednisone an unusual or allergic reaction to zoledronic acid, other medicines, foods, dyes, or preservatives pregnant or trying to get pregnant breast-feeding How should I use this medication? This drug is injected into a vein. It is given by a health care provider in a hospital or clinic setting. Talk to your health  care provider about the use of this drug in children. Special care may be needed. Overdosage: If you think you have taken too much of this medicine contact a poison control center or emergency room at once. NOTE: This medicine is only for you. Do not share this medicine with others. What if I miss a dose? Keep appointments for follow-up doses. It is important not to miss your dose. Call your health care provider if you are unable to keep an appointment. What may interact with this medication? certain antibiotics given by injection NSAIDs, medicines for pain and inflammation, like ibuprofen or naproxen some diuretics like bumetanide, furosemide teriparatide thalidomide This list may not describe all possible interactions. Give your health care provider a list of all the medicines, herbs, non-prescription drugs, or dietary supplements you use. Also tell them if you smoke, drink alcohol, or use illegal drugs. Some items may interact with your medicine. What should I watch for while using this medication? Visit your health care provider for regular checks on your progress. It may be some time before you see the benefit from this drug. Some people who take this drug have severe bone, joint, or muscle pain. This drug may also increase your risk for jaw problems or a broken thigh bone. Tell your health care provider right away if you have severe pain  in your jaw, bones, joints, or muscles. Tell you health care provider if you have any pain that does not go away or that gets worse. Tell your dentist and dental surgeon that you are taking this drug. You should not have major dental surgery while on this drug. See your dentist to have a dental exam and fix any dental problems before starting this drug. Take good care of your teeth while on this drug. Make sure you see your dentist for regular follow-up appointments. You should make sure you get enough calcium and vitamin D while you are taking this drug.  Discuss the foods you eat and the vitamins you take with your health care provider. Check with your health care provider if you have severe diarrhea, nausea, and vomiting, or if you sweat a lot. The loss of too much body fluid may make it dangerous for you to take this drug. You may need blood work done while you are taking this drug. Do not become pregnant while taking this drug. Women should inform their health care provider if they wish to become pregnant or think they might be pregnant. There is potential for serious harm to an unborn child. Talk to your health care provider for more information. What side effects may I notice from receiving this medication? Side effects that you should report to your doctor or health care provider as soon as possible: allergic reactions (skin rash, itching or hives; swelling of the face, lips, or tongue) bone pain infection (fever, chills, cough, sore throat, pain or trouble passing urine) jaw pain, especially after dental work joint pain kidney injury (trouble passing urine or change in the amount of urine) low blood pressure (dizziness; feeling faint or lightheaded, falls; unusually weak or tired) low calcium levels (fast heartbeat; muscle cramps or pain; pain, tingling, or numbness in the hands or feet; seizures) low magnesium levels (fast, irregular heartbeat; muscle cramp or pain; muscle weakness; tremors; seizures) low red blood cell counts (trouble breathing; feeling faint; lightheaded, falls; unusually weak or tired) muscle pain redness, blistering, peeling, or loosening of the skin, including inside the mouth severe diarrhea swelling of the ankles, feet, hands trouble breathing Side effects that usually do not require medical attention (report to your doctor or health care provider if they continue or are bothersome): anxious constipation coughing depressed mood eye irritation, itching, or pain fever general ill feeling or flu-like  symptoms nausea pain, redness, or irritation at site where injected trouble sleeping This list may not describe all possible side effects. Call your doctor for medical advice about side effects. You may report side effects to FDA at 1-800-FDA-1088. Where should I keep my medication? This drug is given in a hospital or clinic. It will not be stored at home. NOTE: This sheet is a summary. It may not cover all possible information. If you have questions about this medicine, talk to your doctor, pharmacist, or health care provider.  2022 Elsevier/Gold Standard (2021-08-07 00:00:00) Pembrolizumab injection What is this medication? PEMBROLIZUMAB (pem broe liz ue mab) is a monoclonal antibody. It is used to treat certain types of cancer. This medicine may be used for other purposes; ask your health care provider or pharmacist if you have questions. COMMON BRAND NAME(S): Keytruda What should I tell my care team before I take this medication? They need to know if you have any of these conditions: autoimmune diseases like Crohn's disease, ulcerative colitis, or lupus have had or planning to have an allogeneic stem cell transplant (  uses someone else's stem cells) history of organ transplant history of chest radiation nervous system problems like myasthenia gravis or Guillain-Barre syndrome an unusual or allergic reaction to pembrolizumab, other medicines, foods, dyes, or preservatives pregnant or trying to get pregnant breast-feeding How should I use this medication? This medicine is for infusion into a vein. It is given by a health care professional in a hospital or clinic setting. A special MedGuide will be given to you before each treatment. Be sure to read this information carefully each time. Talk to your pediatrician regarding the use of this medicine in children. While this drug may be prescribed for children as young as 6 months for selected conditions, precautions do apply. Overdosage: If  you think you have taken too much of this medicine contact a poison control center or emergency room at once. NOTE: This medicine is only for you. Do not share this medicine with others. What if I miss a dose? It is important not to miss your dose. Call your doctor or health care professional if you are unable to keep an appointment. What may interact with this medication? Interactions have not been studied. This list may not describe all possible interactions. Give your health care provider a list of all the medicines, herbs, non-prescription drugs, or dietary supplements you use. Also tell them if you smoke, drink alcohol, or use illegal drugs. Some items may interact with your medicine. What should I watch for while using this medication? Your condition will be monitored carefully while you are receiving this medicine. You may need blood work done while you are taking this medicine. Do not become pregnant while taking this medicine or for 4 months after stopping it. Women should inform their doctor if they wish to become pregnant or think they might be pregnant. There is a potential for serious side effects to an unborn child. Talk to your health care professional or pharmacist for more information. Do not breast-feed an infant while taking this medicine or for 4 months after the last dose. What side effects may I notice from receiving this medication? Side effects that you should report to your doctor or health care professional as soon as possible: allergic reactions like skin rash, itching or hives, swelling of the face, lips, or tongue bloody or black, tarry breathing problems changes in vision chest pain chills confusion constipation cough diarrhea dizziness or feeling faint or lightheaded fast or irregular heartbeat fever flushing joint pain low blood counts - this medicine may decrease the number of white blood cells, red blood cells and platelets. You may be at increased risk  for infections and bleeding. muscle pain muscle weakness pain, tingling, numbness in the hands or feet persistent headache redness, blistering, peeling or loosening of the skin, including inside the mouth signs and symptoms of high blood sugar such as dizziness; dry mouth; dry skin; fruity breath; nausea; stomach pain; increased hunger or thirst; increased urination signs and symptoms of kidney injury like trouble passing urine or change in the amount of urine signs and symptoms of liver injury like dark urine, light-colored stools, loss of appetite, nausea, right upper belly pain, yellowing of the eyes or skin sweating swollen lymph nodes weight loss Side effects that usually do not require medical attention (report to your doctor or health care professional if they continue or are bothersome): decreased appetite hair loss tiredness This list may not describe all possible side effects. Call your doctor for medical advice about side effects. You may report side  effects to FDA at 1-800-FDA-1088. Where should I keep my medication? This drug is given in a hospital or clinic and will not be stored at home. NOTE: This sheet is a summary. It may not cover all possible information. If you have questions about this medicine, talk to your doctor, pharmacist, or health care provider.  2022 Elsevier/Gold Standard (2021-08-07 00:00:00)

## 2022-01-27 ENCOUNTER — Encounter: Payer: Self-pay | Admitting: Oncology

## 2022-01-28 ENCOUNTER — Emergency Department (HOSPITAL_COMMUNITY)
Admission: EM | Admit: 2022-01-28 | Discharge: 2022-01-28 | Disposition: A | Discharge status: Home / Self Care | Payer: Medicare Other | Attending: Emergency Medicine | Admitting: Emergency Medicine

## 2022-01-28 ENCOUNTER — Emergency Department (HOSPITAL_COMMUNITY): Payer: Medicare Other

## 2022-01-28 ENCOUNTER — Other Ambulatory Visit: Payer: Self-pay

## 2022-01-28 ENCOUNTER — Encounter (HOSPITAL_COMMUNITY): Payer: Self-pay | Admitting: Emergency Medicine

## 2022-01-28 DIAGNOSIS — R7309 Other abnormal glucose: Secondary | ICD-10-CM | POA: Diagnosis not present

## 2022-01-28 DIAGNOSIS — R262 Difficulty in walking, not elsewhere classified: Secondary | ICD-10-CM | POA: Diagnosis not present

## 2022-01-28 DIAGNOSIS — Z85828 Personal history of other malignant neoplasm of skin: Secondary | ICD-10-CM | POA: Diagnosis not present

## 2022-01-28 DIAGNOSIS — I639 Cerebral infarction, unspecified: Secondary | ICD-10-CM | POA: Insufficient documentation

## 2022-01-28 DIAGNOSIS — R2990 Unspecified symptoms and signs involving the nervous system: Secondary | ICD-10-CM

## 2022-01-28 DIAGNOSIS — D638 Anemia in other chronic diseases classified elsewhere: Secondary | ICD-10-CM | POA: Insufficient documentation

## 2022-01-28 DIAGNOSIS — R200 Anesthesia of skin: Secondary | ICD-10-CM | POA: Diagnosis not present

## 2022-01-28 DIAGNOSIS — R531 Weakness: Secondary | ICD-10-CM | POA: Diagnosis not present

## 2022-01-28 DIAGNOSIS — D649 Anemia, unspecified: Secondary | ICD-10-CM | POA: Diagnosis not present

## 2022-01-28 DIAGNOSIS — R29810 Facial weakness: Secondary | ICD-10-CM | POA: Insufficient documentation

## 2022-01-28 DIAGNOSIS — I447 Left bundle-branch block, unspecified: Secondary | ICD-10-CM | POA: Diagnosis not present

## 2022-01-28 DIAGNOSIS — G9389 Other specified disorders of brain: Secondary | ICD-10-CM | POA: Diagnosis not present

## 2022-01-28 DIAGNOSIS — R9431 Abnormal electrocardiogram [ECG] [EKG]: Secondary | ICD-10-CM | POA: Diagnosis not present

## 2022-01-28 LAB — PROTIME-INR
INR: 1.1 (ref 0.8–1.2)
Prothrombin Time: 14 seconds (ref 11.4–15.2)

## 2022-01-28 LAB — I-STAT CHEM 8, ED
BUN: 19 mg/dL (ref 8–23)
Calcium, Ion: 1.07 mmol/L — ABNORMAL LOW (ref 1.15–1.40)
Chloride: 106 mmol/L (ref 98–111)
Creatinine, Ser: 0.9 mg/dL (ref 0.61–1.24)
Glucose, Bld: 151 mg/dL — ABNORMAL HIGH (ref 70–99)
HCT: 34 % — ABNORMAL LOW (ref 39.0–52.0)
Hemoglobin: 11.6 g/dL — ABNORMAL LOW (ref 13.0–17.0)
Potassium: 3.5 mmol/L (ref 3.5–5.1)
Sodium: 141 mmol/L (ref 135–145)
TCO2: 25 mmol/L (ref 22–32)

## 2022-01-28 LAB — COMPREHENSIVE METABOLIC PANEL
ALT: 18 U/L (ref 0–44)
AST: 20 U/L (ref 15–41)
Albumin: 2.6 g/dL — ABNORMAL LOW (ref 3.5–5.0)
Alkaline Phosphatase: 74 U/L (ref 38–126)
Anion gap: 9 (ref 5–15)
BUN: 17 mg/dL (ref 8–23)
CO2: 26 mmol/L (ref 22–32)
Calcium: 8.1 mg/dL — ABNORMAL LOW (ref 8.9–10.3)
Chloride: 106 mmol/L (ref 98–111)
Creatinine, Ser: 1.09 mg/dL (ref 0.61–1.24)
GFR, Estimated: >60 mL/min (ref >60)
Glucose, Bld: 153 mg/dL — ABNORMAL HIGH (ref 70–99)
Potassium: 3.6 mmol/L (ref 3.5–5.1)
Sodium: 141 mmol/L (ref 135–145)
Total Bilirubin: 0.5 mg/dL (ref 0.3–1.2)
Total Protein: 5.8 g/dL — ABNORMAL LOW (ref 6.5–8.1)

## 2022-01-28 LAB — DIFFERENTIAL
Abs Immature Granulocytes: 0.03 10*3/uL (ref 0.00–0.07)
Basophils Absolute: 0 10*3/uL (ref 0.0–0.1)
Basophils Relative: 0 %
Eosinophils Absolute: 0.1 10*3/uL (ref 0.0–0.5)
Eosinophils Relative: 1 %
Immature Granulocytes: 0 %
Lymphocytes Relative: 27 %
Lymphs Abs: 2.2 10*3/uL (ref 0.7–4.0)
Monocytes Absolute: 0.8 10*3/uL (ref 0.1–1.0)
Monocytes Relative: 10 %
Neutro Abs: 4.9 10*3/uL (ref 1.7–7.7)
Neutrophils Relative %: 62 %

## 2022-01-28 LAB — CBC
HCT: 37 % — ABNORMAL LOW (ref 39.0–52.0)
Hemoglobin: 10.9 g/dL — ABNORMAL LOW (ref 13.0–17.0)
MCH: 27.7 pg (ref 26.0–34.0)
MCHC: 29.5 g/dL — ABNORMAL LOW (ref 30.0–36.0)
MCV: 94.1 fL (ref 80.0–100.0)
Platelets: 243 10*3/uL (ref 150–400)
RBC: 3.93 MIL/uL — ABNORMAL LOW (ref 4.22–5.81)
RDW: 15.7 % — ABNORMAL HIGH (ref 11.5–15.5)
WBC: 7.9 10*3/uL (ref 4.0–10.5)
nRBC: 0 % (ref 0.0–0.2)

## 2022-01-28 LAB — APTT: aPTT: 30 seconds (ref 24–36)

## 2022-01-28 LAB — CBG MONITORING, ED: Glucose-Capillary: 136 mg/dL — ABNORMAL HIGH (ref 70–99)

## 2022-01-28 IMAGING — CT CT HEAD CODE STROKE
4 of 5 series · 15 of 47 positions shown, 17 images · non-contrast
Comparison: [DATE]

CLINICAL DATA: Code stroke.  Left facial numbness



[Series 2: head 5.0 st · axial · 0.46mm/px · z∈[+457,+547]mm · 4 of 31 slices shown]
[im 7/31  brain]
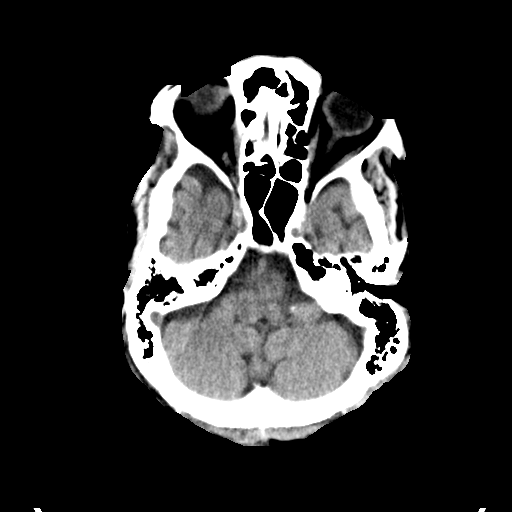
[im 13/31  brain]
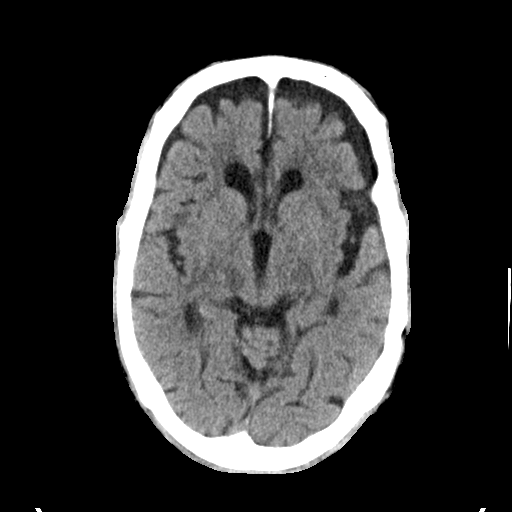
[im 19/31  brain]
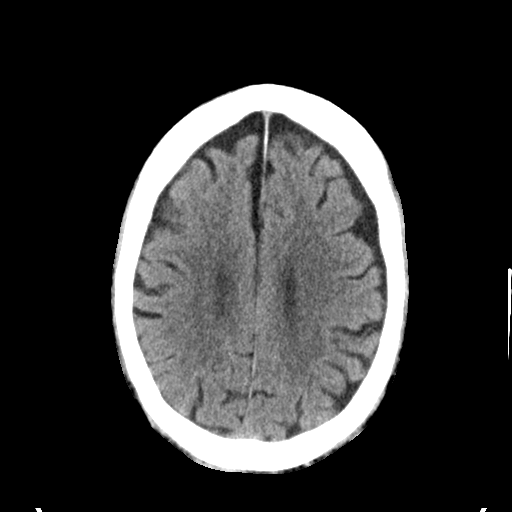
[im 25/31  brain]
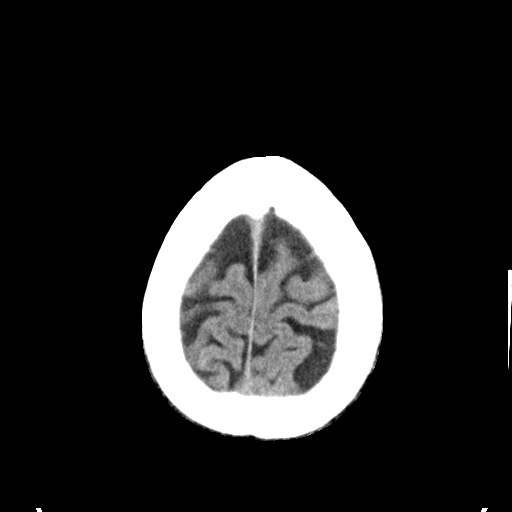

[Series 5: head 3.0 cor st · coronal · 0.30mm/px · 3 of 69 slices shown]
[im 23/69  brain]
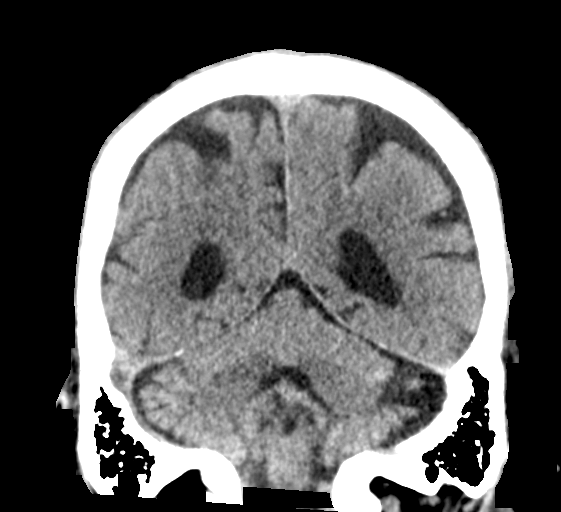
[im 31/69  brain]
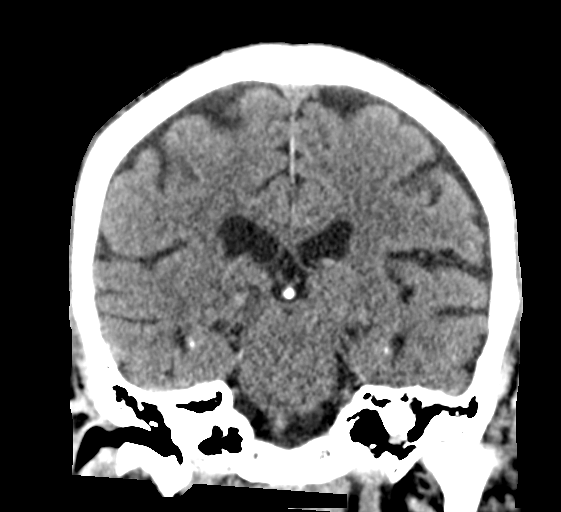
[im 38/69  brain]
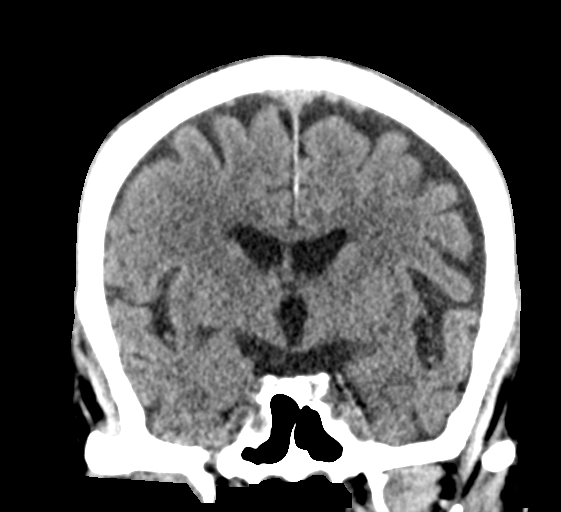

[Series 6: head 3.0 sag st · sagittal · 0.32mm/px · 3 of 51 slices shown]
[im 17/51  brain]
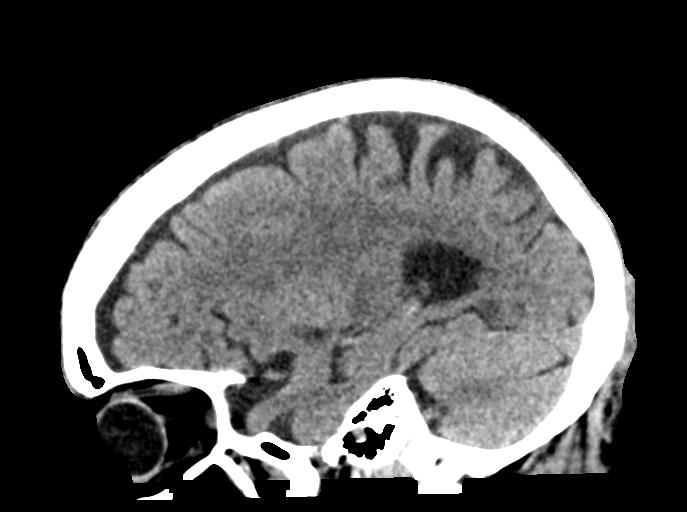
[im 26/51  brain]
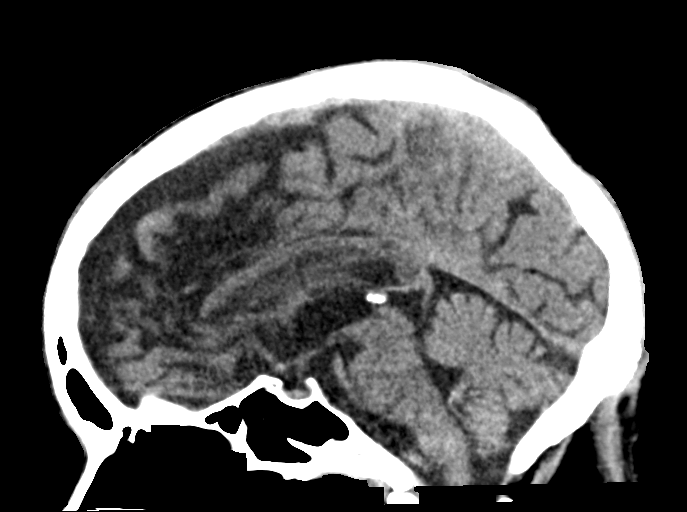
[im 34/51  brain]
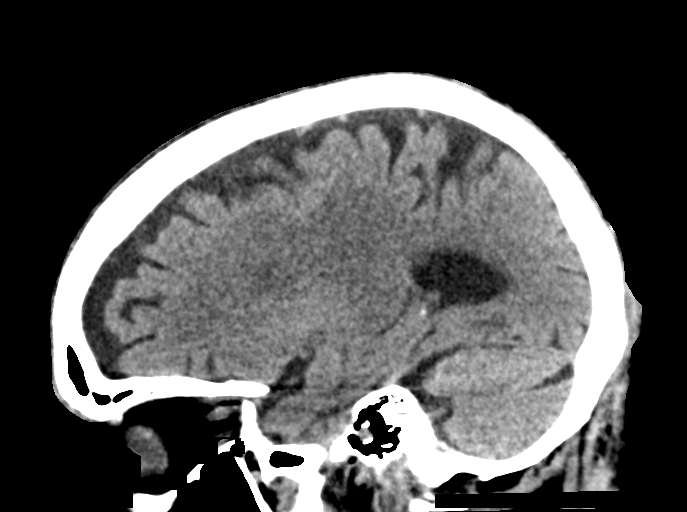

[Series 7: head 5.0 ax st · axial · 0.46mm/px · z∈[+461,+556]mm · 5 of 31 slices shown, 7 images]
[im 6/31  brain]
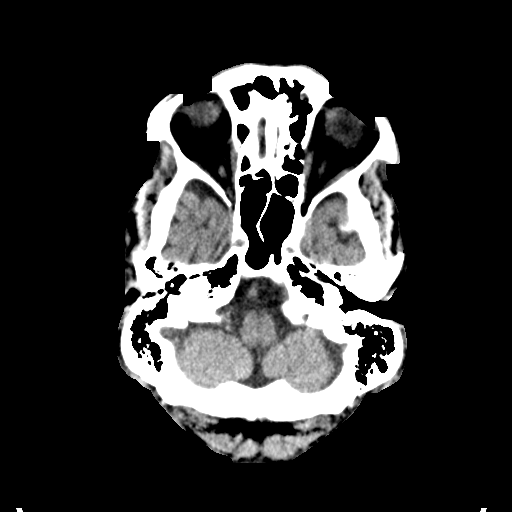
[im 6/31  bone]
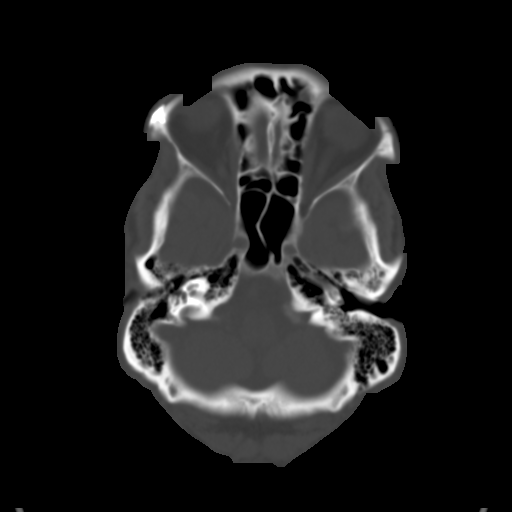
[im 11/31  brain]
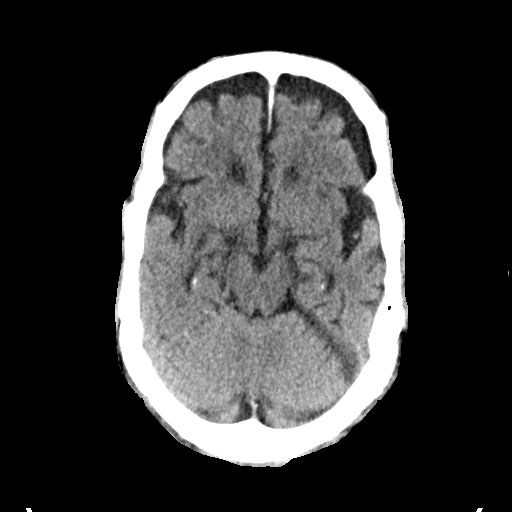
[im 16/31  brain]
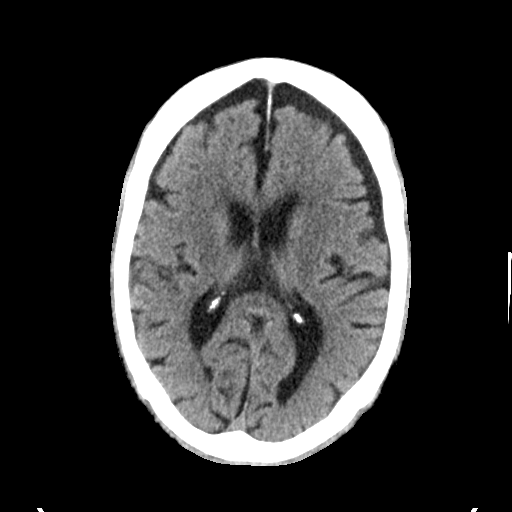
[im 21/31  brain]
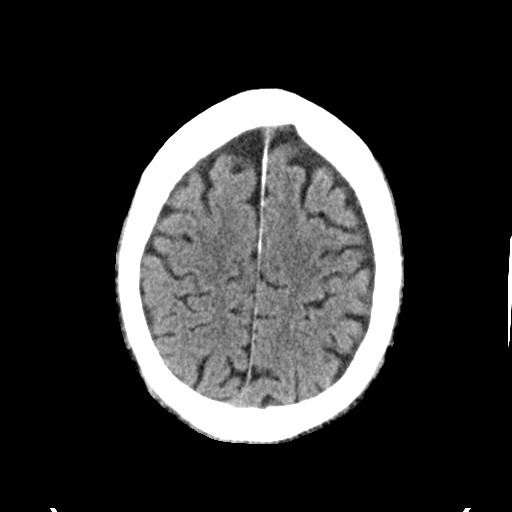
[im 26/31  brain]
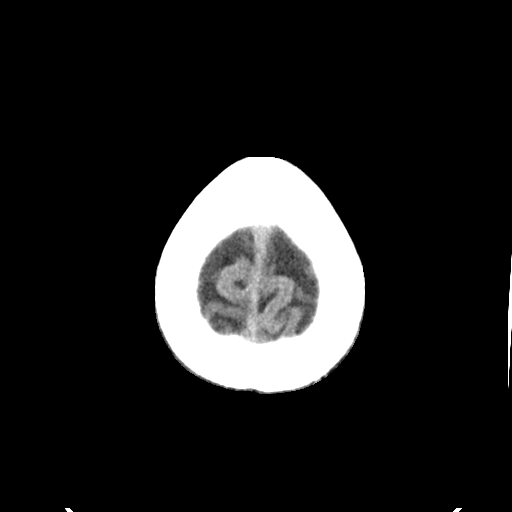
[im 26/31  bone]
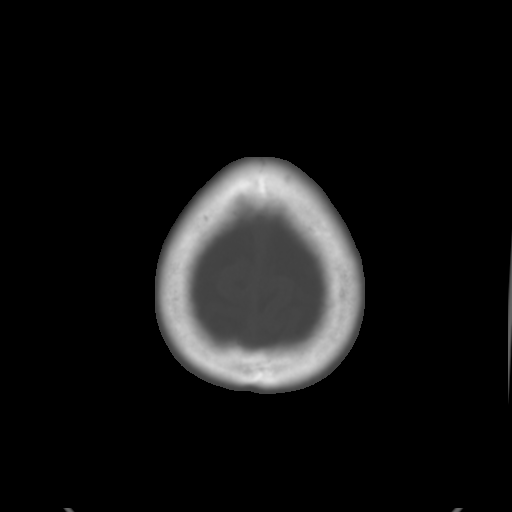

[15 of 47 positions shown; findings below may reference images not displayed]

FINDINGS: Brain: No acute intracranial hemorrhage. Similar appearance of
heterogeneous right thalamic lesion(s) present on multiple recent
prior studies though not discussed on the [DATE] report. No new
loss of gray-white differentiation. Ventricles and sulci are stable
in size and configuration.

Vascular: No hyperdense vessel.

Skull: Unremarkable.

Sinuses/Orbits: No acute abnormality.

Other: Mastoid air cells are clear.
IMPRESSION: Similar appearance of heterogeneous right thalamic lesion(s) better
evaluated on prior MRI. No acute intracranial hemorrhage or new loss
of gray-white differentiation.

These results were communicated to Dr. HYRUM at [DATE] on
[DATE] by text page via the [employer] messaging system.

## 2022-01-28 IMAGING — MR MR HEAD WO/W CM
9 of 13 series · 34 of 48 positions shown · IV contrast (Yes   MULTIHANCE)
Comparison: [DATE]

CLINICAL DATA: Neuro deficit, acute, stroke suspected; new L facial
droop

EXAM:
MRI HEAD WITHOUT AND WITH CONTRAST
TECHNIQUE: Multiplanar, multiecho pulse sequences of the brain and surrounding
structures were obtained without and with intravenous contrast.
CONTRAST:  6mL GADAVIST GADOBUTROL 1 MMOL/ML IV SOLN

[Series 3: DWI · axial · 3.0mm · 1.09mm/px · z∈[-63,+81]mm · 9 of 98 slices shown (1 of 4)]
[im 1/98]
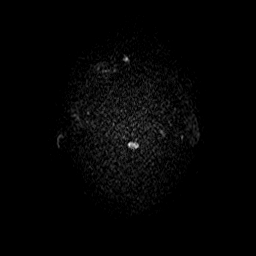
[im 13/98]
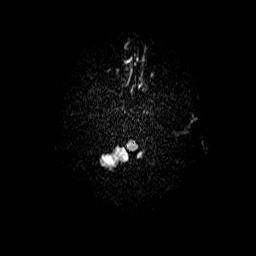
[im 25/98]
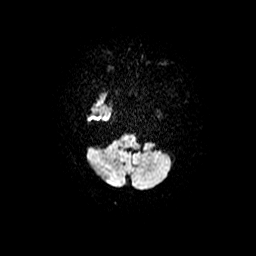
[im 37/98]
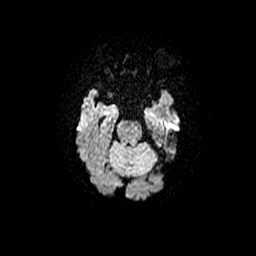
[im 49/98]
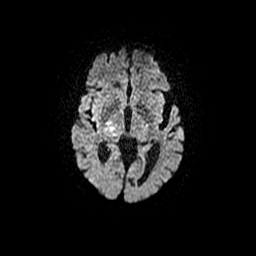
[im 61/98]
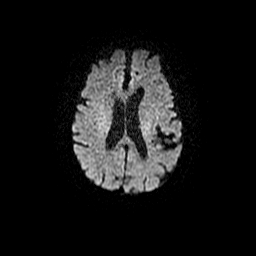
[im 73/98]
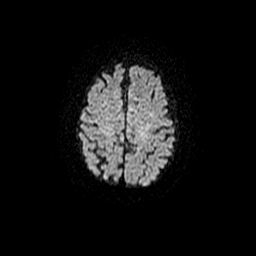
[im 85/98]
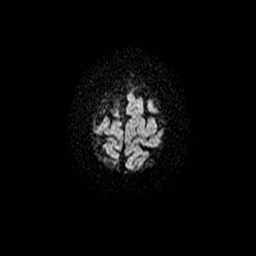
[im 98/98]
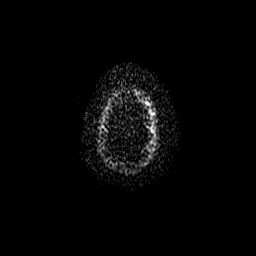

[Series 4: DWI · coronal · 5.0mm · 1.09mm/px · 6 of 74 slices shown (2 of 4)]
[im 1/74]
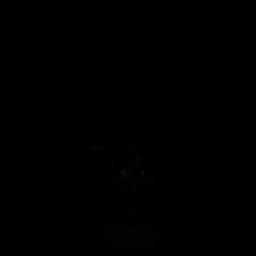
[im 15/74]
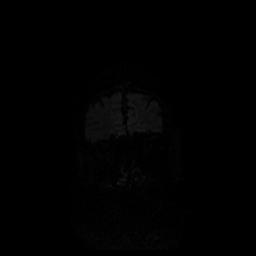
[im 30/74]
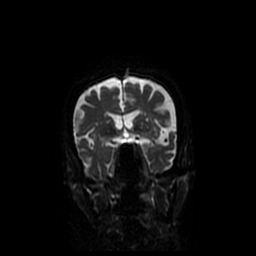
[im 44/74]
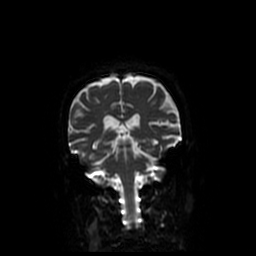
[im 59/74]
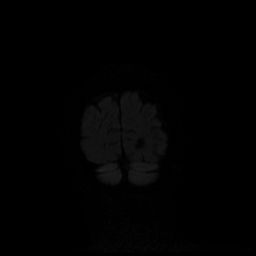
[im 74/74]
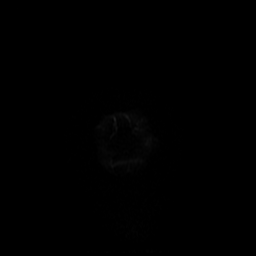

[Series 6: T2 · axial · 5.0mm · 0.47mm/px · z∈[-57,+81]mm · 2 of 24 slices shown]
[im 1/24]
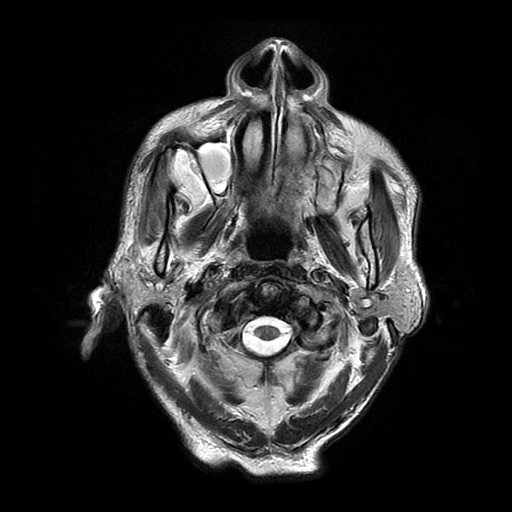
[im 24/24]
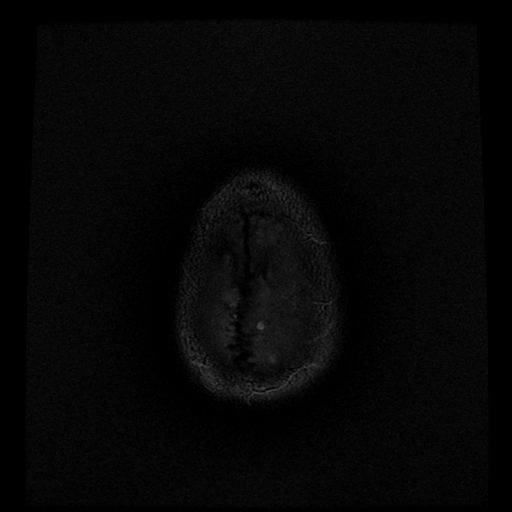

[Series 7: FLAIR · axial · 3.0mm · 0.47mm/px · z∈[-57,+81]mm · 2 of 24 slices shown]
[im 1/24]
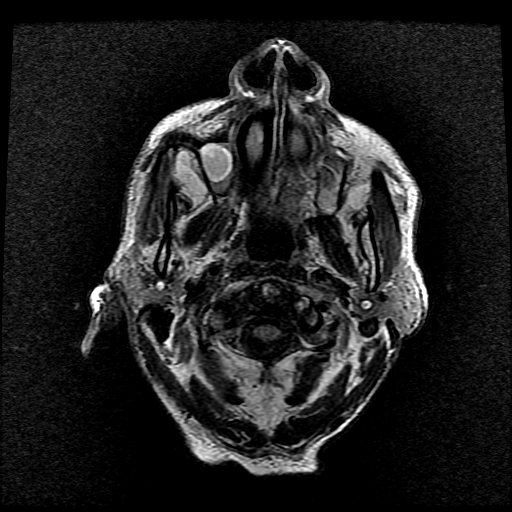
[im 24/24]
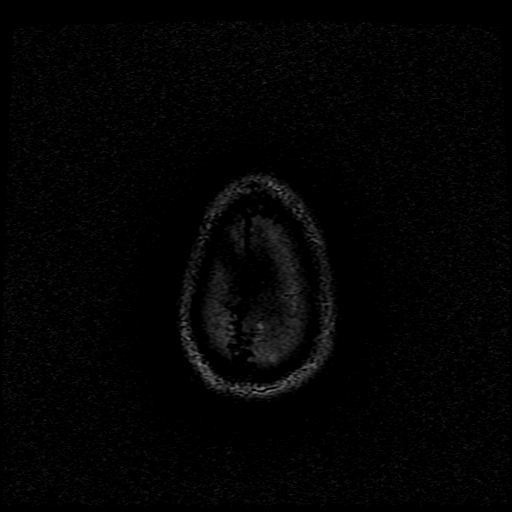

[Series 11: T1 post-contrast · axial · 3.0mm · 0.47mm/px · z∈[-62,+85]mm · 4 of 50 slices shown (1 of 3)]
[im 1/50]
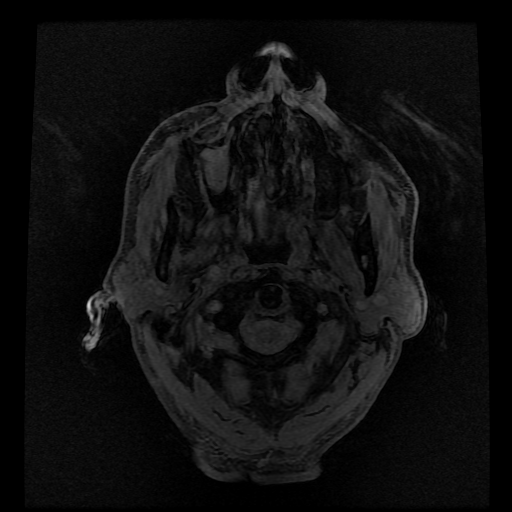
[im 17/50]
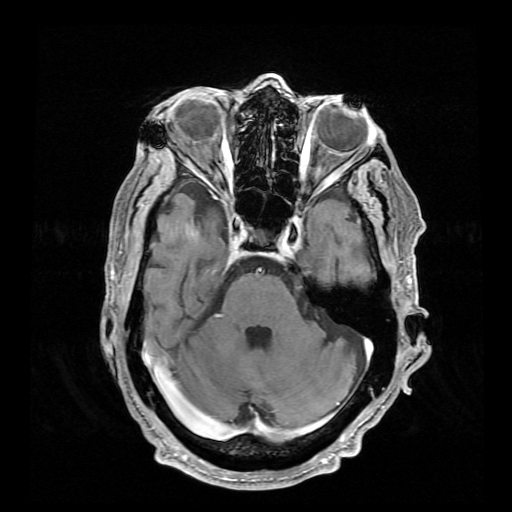
[im 33/50]
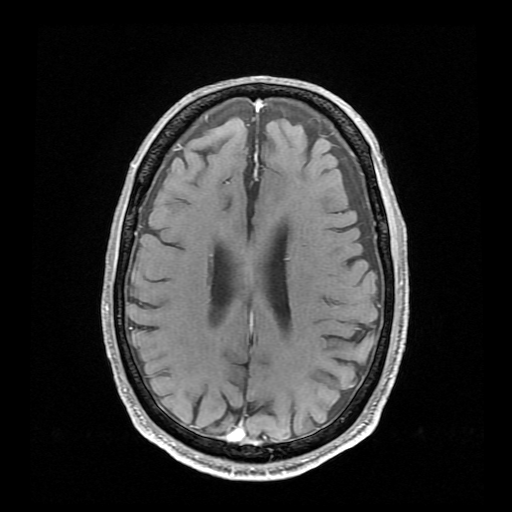
[im 50/50]
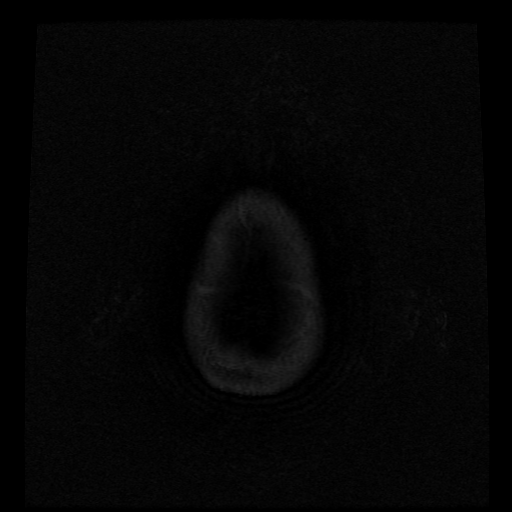

[Series 12: T1 post-contrast · coronal · 5.0mm · 0.39mm/px · 2 of 28 slices shown (2 of 3)]
[im 1/28]
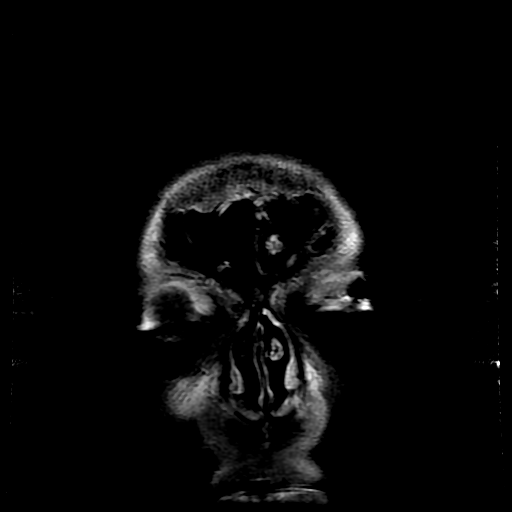
[im 28/28]
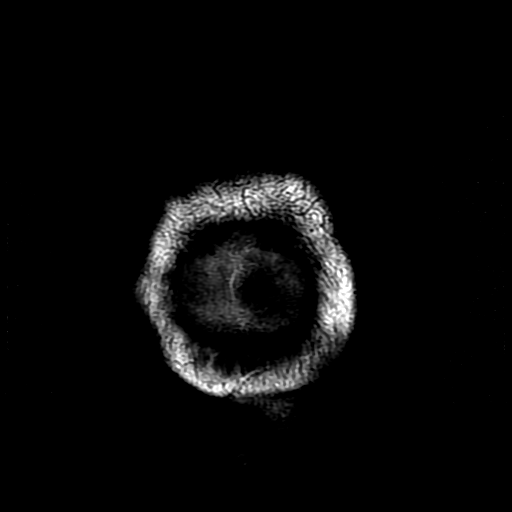

[Series 13: T1 post-contrast · sagittal · 5.0mm · 0.47mm/px · 2 of 24 slices shown (3 of 3)]
[im 1/24]
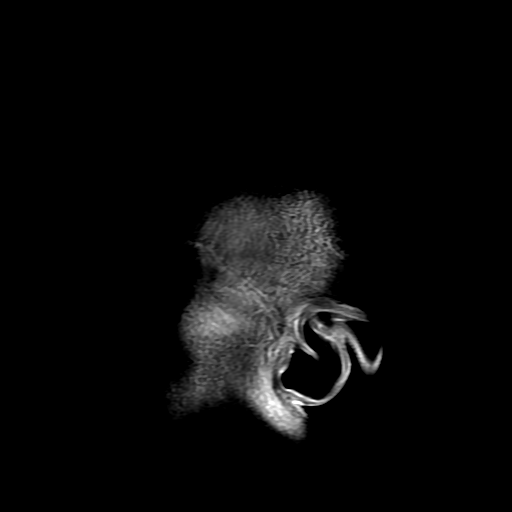
[im 24/24]
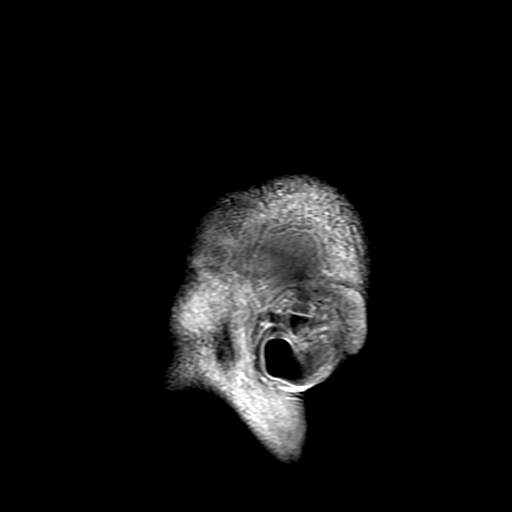

[Series 300: DWI · axial · 3.0mm · 1.09mm/px · z∈[-63,+81]mm · 4 of 49 slices shown (3 of 4)]
[im 1/49]
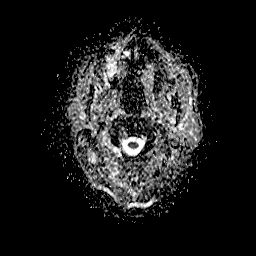
[im 17/49]
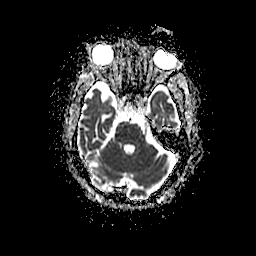
[im 33/49]
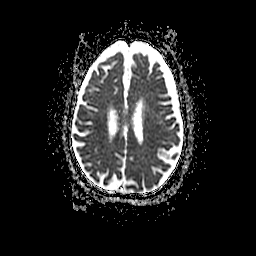
[im 49/49]
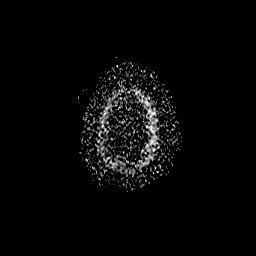

[Series 400: DWI · coronal · 5.0mm · 1.09mm/px · 3 of 37 slices shown (4 of 4)]
[im 1/37]
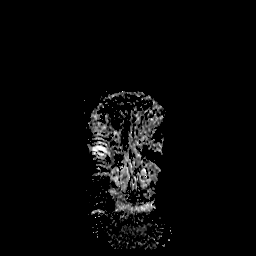
[im 19/37]
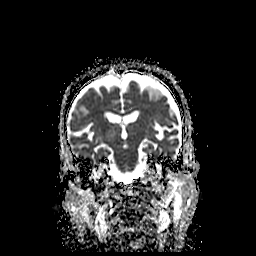
[im 37/37]
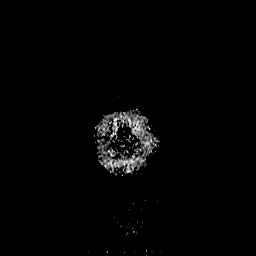

[34 of 48 positions shown; findings below may reference images not displayed]

FINDINGS: Brain: Stable bilobed right thalamic enhancing lesion measuring 9 mm
with surrounding edema. No new mass or abnormal enhancement. There
is no acute infarction or intracranial hemorrhage. There is no
hydrocephalus or extra-axial fluid collection. Additional patchy T2
hyperintensity in the supratentorial white matter is nonspecific but
may reflect mild chronic microvascular ischemic changes. There is
unchanged T2 hyperintensity at the ventral aspect of the superior
medulla without associated enhancement or reduced diffusion; this
presumably reflects gliosis. Prominence of the ventricles and sulci
reflects parenchymal volume loss.

Vascular: Major vessel flow voids at the skull base are preserved.

Skull and upper cervical spine: Normal marrow signal is preserved.

Sinuses/Orbits: Lobular paranasal sinus mucosal thickening. Orbits
are unremarkable.

Other: Sella is unremarkable.  Mastoid air cells are clear.
IMPRESSION: Stable bilobed right thalamic lesion and associated edema. No new
abnormality.

## 2022-01-28 MED ORDER — GADOBUTROL 1 MMOL/ML IV SOLN
6.0000 mL | Freq: Once | INTRAVENOUS | Status: AC | PRN
  Administered 2022-01-28: 6 mL via INTRAVENOUS

## 2022-01-28 MED ORDER — SODIUM CHLORIDE 0.9% FLUSH
3.0000 mL | Freq: Once | INTRAVENOUS | Status: AC
  Administered 2022-01-28: 3 mL via INTRAVENOUS

## 2022-01-28 NOTE — ED Triage Notes (Signed)
Patient BIB [facility] from home after his daughter called for stroke like symptoms. Patient reports going to sleep around 10pm last night and woke up around 0400 with reduced sensation on the left side. EMS reports patient had mild facial droop, left arm weakness, and was dragging his left foot when attempting to ambulate. Patient currently A&Ox4, no facial droop or left sided weakness at this time. Patient reports facial numbness and tingling in his left fingers.

## 2022-01-28 NOTE — ED Notes (Signed)
Daughter called, she is coming to pick him up. Daughter states this will take 18mins - an hour for her to get here.

## 2022-01-28 NOTE — Consult Note (Addendum)
NEUROLOGY CONSULTATION NOTE   Date of service: January 28, 2022 Patient Name: Robert Hamilton MRN:  [id number] DOB:  July 18, 1934 Reason for consult: "L sided numbness + weakness" Requesting Provider: Lianne Cure, DO _ _ _   _ __   _ __ _ _  __ __   _ __   __ _  History of Present Illness  Robert Hamilton is a 88 y.o. male with PMH significant for COPD, hypertension, GERD, recent diagnosis of's SCC of lung with bone and CNS mets on immunotherapy with pembrolizumab who presents with left facial droop and left-sided weakness.  Patient reports that he went to bed at 2200 on 01/27/2022 and woke up sometime after midnight with feeling like the left side of his face felt dead and his mattress felt solid and hard.  Patient's family noted that he was weak on the left side and called EMS.  EMS noted the patient was dragging his left foot when he attempted to ambulate.  He was brought in as a code stroke for acute neurological change.  On arrival, facial droop had resolved.  Patient continues to endorse left-sided numbness in his face and left arm.  He denies any prior history of strokes, he quit smoking several years ago, no family history of stroke.  LKW: 2200 on 01/27/2022. mRS: 0 tNKASE: Not offered due to known CNS met and outside the window. Thrombectomy: Not offered due to low suspicion for LVO. NIHSS components Score: Comment  1a Level of Conscious 0'[x]'  1'[]'  2'[]'  3'[]'      1b LOC Questions 0'[x]'  1'[]'  2'[]'       1c LOC Commands 0'[x]'  1'[]'  2'[]'       2 Best Gaze 0'[x]'  1'[]'  2'[]'       3 Visual 0'[x]'  1'[]'  2'[]'  3'[]'      4 Facial Palsy 0'[x]'  1'[]'  2'[]'  3'[]'      5a Motor Arm - left 0'[x]'  1'[]'  2'[]'  3'[]'  4'[]'  UN'[]'    5b Motor Arm - Right 0'[x]'  1'[]'  2'[]'  3'[]'  4'[]'  UN'[]'    6a Motor Leg - Left 0'[x]'  1'[]'  2'[]'  3'[]'  4'[]'  UN'[]'    6b Motor Leg - Right 0'[x]'  1'[]'  2'[]'  3'[]'  4'[]'  UN'[]'    7 Limb Ataxia 0'[x]'  1'[]'  2'[]'  3'[]'  UN'[]'     8 Sensory 0'[]'  1'[]'  2'[x]'  UN'[]'      9 Best Language 0'[x]'  1'[]'  2'[]'  3'[]'      10 Dysarthria 0'[x]'  1'[]'  2'[]'  UN'[]'      11 Extinct. and  Inattention 0'[x]'  1'[]'  2'[]'       TOTAL: 2     ROS   Constitutional Denies weight loss, fever and chills.   HEENT Denies changes in vision and hearing.   Respiratory Denies SOB and cough.   CV Denies palpitations and CP   GI Denies abdominal pain, nausea, vomiting and diarrhea.   GU Denies dysuria and urinary frequency.   MSK Denies myalgia and joint pain.   Skin Denies rash and pruritus.   Neurological Denies headache and syncope.   Psychiatric Denies recent changes in mood. Denies anxiety and depression.    Past History   Past Medical History:  Diagnosis Date   Anemia    Anemia 09/27/2021   Cancer (HCC)    CHF (congestive heart failure) (HCC)    Chronic kidney disease    COPD (chronic obstructive pulmonary disease) (HCC)    Diverticulosis    GERD (gastroesophageal reflux disease)    Hypertension    Past Surgical History:  Procedure Laterality Date   CRYOTHERAPY  06/26/2021  Procedure: CRYOTHERAPY;  Surgeon: Garner Nash, DO;  Location: [facility];  Service: Pulmonary;;   HEMOSTASIS CONTROL  06/26/2021   Procedure: HEMOSTASIS CONTROL;  Surgeon: Garner Nash, DO;  Location: [facility];  Service: Pulmonary;;   HERNIA REPAIR     PROSTATE SURGERY     PROSTATECTOMY     VIDEO BRONCHOSCOPY Left 06/26/2021   Procedure: VIDEO BRONCHOSCOPY WITHOUT FLUORO;  Surgeon: Garner Nash, DO;  Location: [facility];  Service: Pulmonary;  Laterality: Left;  possible cryotherapy   Family History  Problem Relation Age of Onset   Heart attack Mother    Hypertension Mother    Hypertension Father    Hypertension Sister    Hypertension Sister    Hypertension Sister    Hypertension Sister    Social History   Socioeconomic History   Marital status: Divorced    Spouse name: Not on file   Number of children: 2   Years of education: Not on file   Highest education level: Not on file  Occupational History   Not on file  Tobacco Use   Smoking status: Former    Packs/day:  0.25    Types: Cigarettes    Quit date: 12/2020    Years since quitting: 1.1   Smokeless tobacco: Never   Tobacco comments:    1-2 per day  Vaping Use   Vaping Use: Never used  Substance and Sexual Activity   Alcohol use: Not Currently    Comment: stopped 12/2018   Drug use: No   Sexual activity: Yes  Other Topics Concern   Not on file  Social History Narrative   Not on file   Social Determinants of Health   Financial Resource Strain: Not on file  Food Insecurity: Not on file  Transportation Needs: Not on file  Physical Activity: Not on file  Stress: Not on file  Social Connections: Not on file   Allergies  Allergen Reactions   Paclitaxel Shortness Of Breath    Medications  (Not in a hospital admission)    Vitals   Vitals:   01/28/22 1340 01/28/22 1344 01/28/22 1347  BP:  (!) 154/80   Pulse:  90   Resp:  14   Temp:  98 F (36.7 C)   TempSrc:  Oral   SpO2: 98%    Weight:   59 kg  Height:   '5\' 9"'  (1.753 m)     Body mass index is 19.2 kg/m.  Physical Exam   General: Laying comfortably in bed; in no acute distress.  HENT: Normal oropharynx and mucosa. Normal external appearance of ears and nose.  Neck: Supple, no pain or tenderness  CV: No JVD. No peripheral edema.  Pulmonary: Symmetric Chest rise. Normal respiratory effort.  Abdomen: Soft to touch, non-tender.  Ext: No cyanosis, edema, or deformity  Skin: No rash. Normal palpation of skin.   Musculoskeletal: Normal digits and nails by inspection. No clubbing.   Neurologic Examination  Mental status/Cognition: Alert, oriented to self, place, month and year, good attention.  Speech/language: Fluent, comprehension intact, object naming intact, repetition intact.  Cranial nerves:   CN II Pupils equal and reactive to light, no VF deficits    CN III,IV,VI EOM intact, no gaze preference or deviation, no nystagmus    CN V Decreased sensation in left lower face.   CN VII no asymmetry, no nasolabial fold  flattening    CN VIII normal hearing to speech   CN IX & X normal  palatal elevation, no uvular deviation    CN XI 5/5 head turn and 5/5 shoulder shrug bilaterally    CN XII midline tongue protrusion    Motor:  Muscle bulk: poor, tone normal, pronator drift none tremor none Mvmt Root Nerve  Muscle Right Left Comments  SA C5/6 Ax Deltoid 5 5   EF C5/6 Mc Biceps 5 5   EE C6/7/8 Rad Triceps 5 5   WF C6/7 Med FCR     WE C7/8 PIN ECU     F Ab C8/T1 U ADM/FDI 5 5   HF L1/2/3 Fem Illopsoas 5 5   KE L2/3/4 Fem Quad 5 5   DF L4/5 D Peron Tib Ant 5 5   PF S1/2 Tibial Grc/Sol 5 5    Reflexes:  Right Left Comments  Pectoralis      Biceps (C5/6) 2 2   Brachioradialis (C5/6) 2 2    Triceps (C6/7) 2 2    Patellar (L3/4) 2 2    Achilles (S1)      Hoffman      Plantar     Jaw jerk    Sensation:  Light touch Dec in L face but intact in L arm and L leg.   Pin prick    Temperature    Vibration   Proprioception    Coordination/Complex Motor:  - Finger to Nose intact BL - Heel to shin intact BL - Rapid alternating movement are slowed throughout - Gait: Deferred.  Labs   CBC:  Recent Labs  Lab 01/28/22 1321 01/28/22 1331  WBC 7.9  --   NEUTROABS 4.9  --   HGB 10.9* 11.6*  HCT 37.0* 34.0*  MCV 94.1  --   PLT 243  --     Basic Metabolic Panel:  Lab Results  Component Value Date   NA 141 01/28/2022   K 3.5 01/28/2022   CO2 23 (A) 01/18/2022   GLUCOSE 151 (H) 01/28/2022   BUN 19 01/28/2022   CREATININE 0.90 01/28/2022   CALCIUM 8.8 01/18/2022   GFRNONAA >60 01/16/2022   GFRAA 55 (L) 03/24/2019   Lipid Panel: No results found for: LDLCALC HgbA1c: No results found for: HGBA1C Urine Drug Screen:     Component Value Date/Time   LABOPIA NONE DETECTED 03/23/2019 1630   COCAINSCRNUR NONE DETECTED 03/23/2019 1630   LABBENZ NONE DETECTED 03/23/2019 1630   AMPHETMU NONE DETECTED 03/23/2019 1630   THCU NONE DETECTED 03/23/2019 1630   LABBARB NONE DETECTED 03/23/2019 1630     Alcohol Level No results found for: [place]  CT Head without contrast(Personally reviewed): Prior R thalamic lesion noted on MRI and characterized as a brain met appears heterogenous and stable in size.  MRI Brain: Pending  Impression   Robert Hamilton is a 88 y.o. male with PMH significant for COPD, hypertension, GERD, recent diagnosis of's SCC of lung with bone and CNS mets on immunotherapy with pembrolizumab who presents with left facial droop and left-sided weakness.   He has a known brain met in the R thalamus and his symptoms could be explained by the R thalamic lesion. He is hypercoagulable from his cancer and alternatively, could be an acute stroke or a new brain met.  Recommendations  - Recommend repeat MRI Brain with and without contrast. Further workup pending MRI Brain. _________________________________________________________________  Plan discussed with Dr. Pearline Cables with the ED team.  Update: Personally reviwed MRI Brain and shows that the R thalamic met is stable in size. I think  if his symptoms are particularly bothersome, we can do dexamethasone 20m BID and uptitrate as needed but at this time, I would lean towards holding it off. There is not a significant amount of edema. I would recommend follow up with Dr. ZCecil Cobbsfor further evaluation and management outpatient.  Thank you for the opportunity to take part in the care of this patient. If you have any further questions, please contact the neurology consultation attending.  Signed,  SBaton RougePager Number [phone]_ _ _   _ __   _ __ _ _  __ __   _ __   __ _

## 2022-01-28 NOTE — ED Provider Notes (Signed)
[facility] EMERGENCY DEPARTMENT  Provider Note  CSN: [id number] Arrival date & time: 01/28/22 1318  History Chief Complaint  Patient presents with   Code Stroke    Kyrese Gartman is a 88 y.o. male    Home Medications Prior to Admission medications   Medication Sig Start Date End Date Taking? Authorizing Provider  acetaminophen (TYLENOL) 325 MG tablet Take 2 tablets (650 mg total) by mouth every 6 (six) hours as needed for mild pain or fever (or Fever >/= 101). 06/28/21   Barb Merino, MD  albuterol (VENTOLIN HFA) 108 (90 Base) MCG/ACT inhaler 2 puffs every 6 (six) hours as needed. Shortness of breath 06/05/21   [provider]  allopurinol (ZYLOPRIM) 100 MG tablet Take 100 mg by mouth daily. 04/26/21   [provider]  dexamethasone (DECADRON) 4 MG tablet One tab po TID 01/18/22   Lavera Guise A, MD  ENTRESTO 24-26 MG TAKE 1 TABLET BY MOUTH TWICE DAILY 12/04/21   Patwardhan, Reynold Bowen, MD  folic acid (FOLVITE) 1 MG tablet Take 1 mg by mouth daily. 02/23/19   [provider]  metoprolol succinate (TOPROL-XL) 25 MG 24 hr tablet TAKE 1/2 TABLET(12.5 MG) BY MOUTH DAILY 11/05/21   Patwardhan, Manish J, MD  ondansetron (ZOFRAN) 4 MG tablet Take 1 tablet (4 mg total) by mouth every 4 (four) hours as needed for nausea. 09/17/21   Dayton Scrape A, NP  Oxycodone HCl 10 MG TABS Take 1 tablet (10 mg total) by mouth every 4 (four) hours as needed. 12/18/21   Dayton Scrape A, NP  pantoprazole (PROTONIX) 40 MG tablet Take 40 mg by mouth daily. 07/16/19   [provider]  prochlorperazine (COMPAZINE) 10 MG tablet Take 1 tablet (10 mg total) by mouth every 6 (six) hours as needed for nausea or vomiting. 09/17/21   Dayton Scrape A, NP  thiamine (VITAMIN B-1) 100 MG tablet Take 100 mg by mouth daily.    [provider]     Allergies    Paclitaxel   Review of Systems   Review of Systems  Constitutional:  Negative for chills and  fever.  HENT:  Negative for ear pain and sore throat.   Eyes:  Negative for pain and visual disturbance.  Respiratory:  Negative for cough and shortness of breath.   Cardiovascular:  Negative for chest pain and palpitations.  Gastrointestinal:  Negative for abdominal pain and vomiting.  Genitourinary:  Negative for dysuria and hematuria.  Musculoskeletal:  Negative for arthralgias and back pain.  Skin:  Negative for color change and rash.  Neurological:  Positive for facial asymmetry and weakness. Negative for seizures and syncope.  All other systems reviewed and are negative. Please see HPI for pertinent positives and negatives  Physical Exam BP (!) 154/80 (BP Location: Left Arm)    Pulse 90    Temp 98 F (36.7 C) (Oral)    Resp 14    Ht 5\' 9"  (1.753 m)    Wt 59 kg    SpO2 98%    BMI 19.20 kg/m   Physical Exam Vitals and nursing note reviewed.  Constitutional:      General: He is not in acute distress.    Appearance: He is well-developed.  HENT:     Head: Normocephalic and atraumatic.  Eyes:     Conjunctiva/sclera: Conjunctivae normal.  Cardiovascular:     Rate and Rhythm: Normal rate and regular rhythm.     Heart sounds: No murmur  heard. Pulmonary:     Effort: Pulmonary effort is normal. No respiratory distress.     Breath sounds: Normal breath sounds.  Abdominal:     Palpations: Abdomen is soft.     Tenderness: There is no abdominal tenderness.  Musculoskeletal:        General: No swelling.     Cervical back: Neck supple.  Skin:    General: Skin is warm and dry.     Capillary Refill: Capillary refill takes less than 2 seconds.  Neurological:     Mental Status: He is alert.     Comments: No pronator drift.  5 out of 5 strength in upper and lower extremities.  Mild facial sensation deficit in the left lower face.  EOMs are intact.  Pupils are equal round reactive to light.  Psychiatric:        Mood and Affect: Mood normal.    ED Results / Procedures / Treatments    EKG None  Procedures Procedures  Medications Ordered in the ED Medications  sodium chloride flush (NS) 0.9 % injection 3 mL (3 mLs Intravenous Given 01/28/22 1354)     ED Course   Clinical Course as of 01/28/22 1510  Mon Jan 28, 2022  1327 Pt evaluated at the bridge. Left sided facial droop present. Airway intact. Found at [place] known normal unknown. Pt sent directly to CT. Presenting symptoms resolving at this time.  [AG]  1440 Hx of squamous cell carcinoma with mets to bone. New thalamus lesion found on MRI 01/15/2022. Discussed with neurology, will plan for repeat MRI due to new symptoms.  [AG]    Clinical Course User Index [AG] Lianne Cure, DO     MDM   This patient presents to the ED for concern of stroke, this involves an extensive number of treatment options, and is a complaint that carries with it a high risk of complications and morbidity.  The differential diagnosis includes CVA, TIA, malignancy. Patients presentation is complicated by their history of squamous cell carcinoma with mets to bone   Additional history obtained: Additional history obtained from EMS  Records reviewed previous admission documents, Care Everywhere/External Records, and Primary Care Documents  Lab Tests: I Ordered, and personally interpreted labs.  The pertinent results include: No significant abnormalities.  Imaging Studies ordered: I ordered imaging studies including CT scan head   I independently visualized and interpreted imaging which showed similar appearance of known thalamic mass. I agree with the radiologist interpretation  EKG (personally reviewed and interpreted): No acute abnormalities.  No STEMI.  No ischemia.   Medical Decision Making: Patient here with concern for facial droop and possible left-sided weakness.  He was intact.  Found at 4 AM this morning.  Symptoms have resolved or are resolving at this time.  He does have a history of squamous cell cancer with mets  to his bones.  He had a new thalamic lesion found on MRI on 01/15/2022.  Neurology was here in the emergency room the patient's arrival.  They reviewed CT imaging. Dr. Lorrin Goodell from neuro recommending repeat MRI.  Plan to follow-up with neurology team after MRI is resulted for further recommendations.  Complexity of problems addressed: Patients presentation is most consistent with  acute presentation with potential threat to life or bodily function   Patient seen in conjunction with my attending, Dr. Pearline Cables.    Final Clinical Impression(s) / ED Diagnoses Final diagnoses:  Stroke-like symptoms  Hypocalcemia  Anemia of chronic disease  Rx / DC Orders ED Discharge Orders     None         Jacelyn Pi, MD 55/37/48 2707    Lianne Cure, DO 86/75/44 [phone]

## 2022-01-28 NOTE — ED Provider Notes (Signed)
.  Critical Care Performed by: Lianne Cure, DO Authorized by: Lianne Cure, DO   Critical care provider statement:    Critical care time (minutes):  33   Critical care start time:  01/28/2022 1:43 PM   Critical care end time:  01/28/2022 2:23 PM   Critical care was necessary to treat or prevent imminent or life-threatening deterioration of the following conditions: stroke like symptoms with stroke activation, brain mets on MRI.   Critical care was time spent personally by me on the following activities:  Development of treatment plan with patient or surrogate, discussions with consultants, evaluation of patient's response to treatment, examination of patient, ordering and review of laboratory studies, ordering and review of radiographic studies, ordering and performing treatments and interventions, pulse oximetry, re-evaluation of patient's condition and review of old charts    Lianne Cure, DO 16/10/96 1424

## 2022-01-28 NOTE — ED Provider Notes (Signed)
Transfer of Care Note I assumed care of Robert Hamilton on 01/28/2022 at 5:28 PM  Briefly, Robert Hamilton is a 88 y.o. male who: PMHx: Metastatic stage IV lung cancer with mets to the brain (poor prognosis), chronic hypoxic RF (on 3L O2 [place]), chronic systolic HF (LVEF 65%), COPD, anemia P/w strokelike symptoms, LKN 2000 on 2/26, reportedly awoke at 0400 on 2/27 with L facial droop and LHB weakness On arrival to the ED facial droop resolved, patient continues to endorse left facial and LUE numbness The patient has a known right thalamic brain met and his symptoms could be explained by this right thalamic lesion.  However, he is hypercoagulable from his cancer and could alternatively be an acute stroke or new brain met. Neurology has seen and recommended repeat MRI brain with and without contrast EKG reveals sinus rhythm, multiple PVCs, LBBB  Labs: No leukocytosis, no acute anemia, no emergent electrolyte abnormalities, no AKI, coags within normal range  Plan at the time of handoff: F/u MRI brain    Please refer to the original providers note for additional information regarding the care of Robert Hamilton.  Reassessment: I personally reassessed the patient:  Vital Signs:  The most current vitals were Blood pressure 125/88, pulse 82, temperature 98 F (36.7 C), temperature source Oral, resp. rate 17, height '5\' 9"'  (1.753 m), weight 59 kg, SpO2 99 %.   Hemodynamics:  The patient is hemodynamically stable. Mental Status:  The patient is alert  Additional MDM: MRI brain: Stable bilobed right thalamic lesion with associated edema.  No new abnormality.  Spoke with on-call neurologist Robert Simpers, MD) who has reviewed the MRI and recommends dexamethasone 2 mg twice daily and follow-up with Dr. Cecil Cobbs (neuro-oncology).  No additional recommendations, appropriate for discharge at this time.  On chart review, it appears that the patient is already taking dexamethasone 4 mg 3  times daily, will not require dexamethasone at this time.  Will continue with home medication.  Patient seen in conjunction with Dr. Vanita Panda  Electronically signed by:  Wynetta Fines, MD on 01/28/2022 at  5:28 PM  Clinical Impression:  1. Stroke-like symptoms   2. Hypocalcemia   3. Anemia of chronic disease     Dispo: Discharge     Wynetta Fines, MD 01/28/22 1728    Carmin Muskrat, MD 01/28/22 2213

## 2022-01-28 NOTE — Discharge Instructions (Addendum)
Call [phone] to follow-up with Dr. Cecil Cobbs with neuro oncology to discuss treatment options regarding metastatic lesion.  In the interim, given known metastatic lesion in the right thalamus of the brain with some surrounding edema, continue with your dexamethasone (steroid) home medication.  Follow-up with your primary care doctor for post ED follow-up visit.  New other home medications.

## 2022-01-28 NOTE — Code Documentation (Signed)
Stroke Response Nurse Documentation Code Documentation  Robert Hamilton is a 88 y.o. male arriving to [facility]  via [facility] on 01/28/22 with past medical hx of HTN, CA, COPD. On No antithrombotic. Code stroke was activated by EMS  Patient from home alone where he was was presumably [place] on 01/27/22 at 2200. Patient reports he woke up sometime after midnight and felt like his face and left side were numb. He reports around 1000 this AM he called his daughter to report this; she called EMS after she arrived and noticed he had a change in gait.   Stroke team at the bedside on patient arrival. Labs drawn and patient cleared for CT by Dr. Greta Doom. Patient to CT with team. NIHSS 1, see documentation for details and code stroke times. Patient with left decreased sensation on exam. The following imaging was completed: CT. Patient is not a candidate for IV Thrombolytic due to OOW. Patient is not a candidate for IR due to no suspected LVO.  Care/Plan: Q2 NIHSS and vital signs  Bedside handoff with ED RN Paden.  Meda Klinefelter  Stroke Response RN

## 2022-01-28 NOTE — ED Notes (Signed)
Pt currently in MRI

## 2022-01-29 ENCOUNTER — Other Ambulatory Visit: Payer: Self-pay | Admitting: Radiation Therapy

## 2022-01-30 DIAGNOSIS — I509 Heart failure, unspecified: Secondary | ICD-10-CM | POA: Diagnosis not present

## 2022-01-30 DIAGNOSIS — R7303 Prediabetes: Secondary | ICD-10-CM | POA: Diagnosis not present

## 2022-01-30 DIAGNOSIS — I129 Hypertensive chronic kidney disease with stage 1 through stage 4 chronic kidney disease, or unspecified chronic kidney disease: Secondary | ICD-10-CM | POA: Diagnosis not present

## 2022-01-30 DIAGNOSIS — M109 Gout, unspecified: Secondary | ICD-10-CM | POA: Diagnosis not present

## 2022-01-31 ENCOUNTER — Other Ambulatory Visit: Payer: Self-pay | Admitting: Cardiology

## 2022-01-31 DIAGNOSIS — I5022 Chronic systolic (congestive) heart failure: Secondary | ICD-10-CM

## 2022-02-01 NOTE — Progress Notes (Signed)
?[facility]  ?[address] ?[place],  [place]  [address] ?[phone] ? ?Clinic Day:  02/07/2022 ? ?Referring physician: Maryella Shivers, MD ? ?This document serves as a record of services personally performed by [name], MD. It was created on their behalf by Curry,Lauren E, a trained medical scribe. The creation of this record is based on the scribe's personal observations and the provider's statements to them. ? ?HISTORY OF PRESENT ILLNESS:  ?The patient is an 88 y.o. male with metastatic squamous cell lung cancer.  A brain MRI during a recent hospitalization showed a bilobed right thalamic lesion, consistent with metastatic brain disease.  Despite both him and his family being shown the images depicting his metastatic CNS disease, the patient has elected not to undergo radiation to his brain (including a stereotactic approach).  He comes in today to be evaluated before heading into his 4th cycle of maintenance pembrolizumab.  This was preceded by 4 cycles of chemotherapy, which consisted of carboplatin/nab-paclitaxel/pembrolizumab.  Since his last visit, the patient has been doing fairly well.  He denies having any headaches, vision changes, or altered mental status which concerns him for progression of his metastatic CNS disease.  Of note, he is on Decadron 4 mg 3 times daily to offset the vasogenic edema associated with his metastatic brain disease.  ? ?PHYSICAL EXAM: ?Blood pressure (!) 159/88, pulse 84, temperature 97.9 ?F (36.6 ?C), resp. rate 16, height 5\' 9"  (1.753 m), weight 139 lb 8 oz (63.3 kg), SpO2 100 %. ?Wt Readings from Last 3 Encounters:  ?02/07/22 139 lb 8 oz (63.3 kg)  ?01/28/22 130 lb (59 kg)  ?01/22/22 127 lb 1.3 oz (57.6 kg)  ? ?Body mass index is 20.6 kg/m?Marland Kitchen ?Performance status (ECOG): 2 - Symptomatic, <50% confined to bed ?Physical Exam ?Constitutional:   ?   General: He is not in acute distress. ?   Appearance: Normal appearance. He is normal  weight.  ?   Comments: Thin gentleman in a wheelchair  ?HENT:  ?   Head: Normocephalic and atraumatic.  ?Eyes:  ?   General: No scleral icterus. ?   Extraocular Movements: Extraocular movements intact.  ?   Conjunctiva/sclera: Conjunctivae normal.  ?   Pupils: Pupils are equal, round, and reactive to light.  ?Cardiovascular:  ?   Rate and Rhythm: Normal rate and regular rhythm.  ?   Pulses: Normal pulses.  ?   Heart sounds: Normal heart sounds. No murmur heard. ?  No friction rub. No gallop.  ?Pulmonary:  ?   Effort: Pulmonary effort is normal. No respiratory distress.  ?   Breath sounds: Normal breath sounds.  ?Abdominal:  ?   General: Bowel sounds are normal. There is no distension.  ?   Palpations: Abdomen is soft. There is no hepatomegaly, splenomegaly or mass.  ?   Tenderness: There is no abdominal tenderness.  ?Musculoskeletal:     ?   General: Normal range of motion.  ?   Cervical back: Normal range of motion and neck supple.  ?   Right lower leg: No edema.  ?   Left lower leg: No edema.  ?Lymphadenopathy:  ?   Cervical: No cervical adenopathy.  ?Skin: ?   General: Skin is warm and dry.  ?Neurological:  ?   General: No focal deficit present.  ?   Mental Status: He is alert and oriented to person, place, and time. Mental status is at baseline.  ?Psychiatric:     ?  Mood and Affect: Mood is anxious.     ?   Behavior: Behavior normal.     ?   Thought Content: Thought content normal.     ?   Judgment: Judgment normal.  ? ?LABS:  ? ? Latest Reference Range & Units 02/07/22 00:00  ?WBC  9.4  ?RBC 3.87 - 5.11  3.96  ?Hemoglobin 13.5 - 17.5  10.5 !  ?HCT 41 - 53  34 !  ?Platelets 150 - 400 K/uL 192  ?NEUT#  8.46  ?!: Data is abnormal ? Latest Reference Range & Units 02/07/22 00:00  ?Sodium 137 - 147  140  ?Potassium 3.5 - 5.1 mEq/L 3.9  ?Chloride 99 - 108  110 !  ?CO2 13 - 22  24 !  ?Glucose  137  ?BUN 4 - 21  23 !  ?Creatinine 0.6 - 1.3  1.0  ?Calcium 8.7 - 10.7  9.1  ?Alkaline Phosphatase 25 - 125  85  ?Albumin  3.5 - 5.0  3.3 !  ?AST 14 - 40  17  ?ALT 10 - 40 U/L 16  ?Bilirubin, Total  0.5  ?!: Data is abnormal ? ?ASSESSMENT & PLAN:  ?An 88 y.o. male with metastatic squamous cell lung cancer, which includes spread of disease to his right hip and brain.  He will proceed with his 4th cycle of maintenance pembrolizumab early next week.  Once again, I reminded the patient that most forms of treatment have a difficult time permeating the blood-brain barrier to attack metastatic disease in the brain.  He understands radiation is strongly recommended to treat his right thalamic brain lesion.  Despite hearing this, the patient still wished to defer any type of brain radiation.  His daughter, who is with him, is deferring all decision-making to him.  For now, he will continue taking Decadron on a daily basis.  Despite this issue, the patient clinically appears to be doing okay.  I will see him back in 3 weeks before he has into a potential 5th cycle of maintenance pembrolizumab.  The patient understands all the plans discussed today and is in agreement with them.  ? ?I, Rita Ohara, am acting as scribe for [name], MD   ? ?I have reviewed this report as typed by the medical scribe, and it is complete and accurate. ? ?Dequincy Macarthur Critchley, MD ? ? ? ?  ? ?

## 2022-02-04 ENCOUNTER — Inpatient Hospital Stay: Payer: Medicare Other | Attending: Hematology and Oncology

## 2022-02-04 ENCOUNTER — Telehealth: Payer: Self-pay | Admitting: Radiation Therapy

## 2022-02-04 DIAGNOSIS — C3492 Malignant neoplasm of unspecified part of left bronchus or lung: Secondary | ICD-10-CM | POA: Insufficient documentation

## 2022-02-04 DIAGNOSIS — Z79899 Other long term (current) drug therapy: Secondary | ICD-10-CM | POA: Insufficient documentation

## 2022-02-04 DIAGNOSIS — Z5112 Encounter for antineoplastic immunotherapy: Secondary | ICD-10-CM | POA: Insufficient documentation

## 2022-02-04 DIAGNOSIS — C7931 Secondary malignant neoplasm of brain: Secondary | ICD-10-CM | POA: Insufficient documentation

## 2022-02-04 DIAGNOSIS — C7951 Secondary malignant neoplasm of bone: Secondary | ICD-10-CM | POA: Insufficient documentation

## 2022-02-04 NOTE — Telephone Encounter (Signed)
I called [name] daughter to introduce myself and share the recommendations from the brain and spine conference this morning. Dr. Mickeal Skinner and the other providers present, would like to offer radiosurgery to treat the new Rt. Thalamic lesion demonstrated during his recent hospital admission. He will also need to follow-up with Dr. Mickeal Skinner to help manage his symptoms and steroid medication.  ? ?Per his daughter, [name] is not interested in being seen by Dr. Mickeal Skinner or one of the radiation oncologists. He said to her, "I don't want anyone messing with my brain." ? ?[name] is scheduled to see Dr. Bobby Rumpf this Thursday, 3/9. I asked the daughter if it is OK for me to call her back again after his visit with Dr. Bobby Rumpf, to see if he has changed his mind, or reconsidered seeing just Dr. Mickeal Skinner for the symptom and steroid management. I will also forward this message to Dr. Bobby Rumpf so he is aware of the pt.'s disposition prior to his appointment with him.  ? ? ?Mont Dutton R.T.(R)(T) ?Radiation Special Procedures Navigator  ? ?

## 2022-02-07 ENCOUNTER — Other Ambulatory Visit: Payer: Self-pay | Admitting: Oncology

## 2022-02-07 ENCOUNTER — Inpatient Hospital Stay (INDEPENDENT_AMBULATORY_CARE_PROVIDER_SITE_OTHER): Payer: Medicare Other | Admitting: Oncology

## 2022-02-07 ENCOUNTER — Inpatient Hospital Stay: Payer: Medicare Other

## 2022-02-07 ENCOUNTER — Encounter: Payer: Self-pay | Admitting: Oncology

## 2022-02-07 ENCOUNTER — Other Ambulatory Visit: Payer: Self-pay

## 2022-02-07 VITALS — BP 159/88 | HR 84 | Temp 97.9°F | Resp 16 | Ht 69.0 in | Wt 139.5 lb

## 2022-02-07 DIAGNOSIS — C7951 Secondary malignant neoplasm of bone: Secondary | ICD-10-CM | POA: Diagnosis not present

## 2022-02-07 DIAGNOSIS — C3492 Malignant neoplasm of unspecified part of left bronchus or lung: Secondary | ICD-10-CM | POA: Diagnosis not present

## 2022-02-07 DIAGNOSIS — C7931 Secondary malignant neoplasm of brain: Secondary | ICD-10-CM | POA: Diagnosis not present

## 2022-02-07 DIAGNOSIS — Z5112 Encounter for antineoplastic immunotherapy: Secondary | ICD-10-CM | POA: Diagnosis not present

## 2022-02-07 DIAGNOSIS — Z79899 Other long term (current) drug therapy: Secondary | ICD-10-CM | POA: Diagnosis not present

## 2022-02-07 LAB — CBC AND DIFFERENTIAL
HCT: 34 — AB (ref 41–53)
Hemoglobin: 10.5 — AB (ref 13.5–17.5)
Neutrophils Absolute: 8.46
Platelets: 192 10*3/uL (ref 150–400)
WBC: 9.4

## 2022-02-07 LAB — HEPATIC FUNCTION PANEL
ALT: 16 U/L (ref 10–40)
AST: 17 (ref 14–40)
Alkaline Phosphatase: 85 (ref 25–125)
Bilirubin, Total: 0.5

## 2022-02-07 LAB — COMPREHENSIVE METABOLIC PANEL
Albumin: 3.3 — AB (ref 3.5–5.0)
Calcium: 9.1 (ref 8.7–10.7)

## 2022-02-07 LAB — BASIC METABOLIC PANEL
BUN: 23 — AB (ref 4–21)
CO2: 24 — AB (ref 13–22)
Chloride: 110 — AB (ref 99–108)
Creatinine: 1 (ref 0.6–1.3)
Glucose: 137
Potassium: 3.9 mEq/L (ref 3.5–5.1)
Sodium: 140 (ref 137–147)

## 2022-02-07 LAB — TSH: TSH: 1.993 u[IU]/mL (ref 0.350–4.500)

## 2022-02-07 LAB — CBC: RBC: 3.96 (ref 3.87–5.11)

## 2022-02-08 LAB — T4: T4, Total: 5.3 ug/dL (ref 4.5–12.0)

## 2022-02-08 MED FILL — Pembrolizumab IV Soln 100 MG/4ML (25 MG/ML): INTRAVENOUS | Qty: 8 | Status: AC

## 2022-02-08 MED FILL — Zoledronic Acid Inj Conc For IV Infusion 4 MG/5ML: INTRAVENOUS | Qty: 3.75 | Status: AC

## 2022-02-10 ENCOUNTER — Encounter: Payer: Self-pay | Admitting: Oncology

## 2022-02-12 ENCOUNTER — Inpatient Hospital Stay: Payer: Medicare Other

## 2022-02-12 VITALS — BP 134/62 | HR 74 | Temp 97.9°F | Resp 18 | Ht 69.0 in | Wt 137.8 lb

## 2022-02-12 DIAGNOSIS — C7931 Secondary malignant neoplasm of brain: Secondary | ICD-10-CM | POA: Diagnosis not present

## 2022-02-12 DIAGNOSIS — C3492 Malignant neoplasm of unspecified part of left bronchus or lung: Secondary | ICD-10-CM | POA: Diagnosis not present

## 2022-02-12 DIAGNOSIS — Z79899 Other long term (current) drug therapy: Secondary | ICD-10-CM | POA: Diagnosis not present

## 2022-02-12 DIAGNOSIS — Z5112 Encounter for antineoplastic immunotherapy: Secondary | ICD-10-CM | POA: Diagnosis not present

## 2022-02-12 DIAGNOSIS — C7951 Secondary malignant neoplasm of bone: Secondary | ICD-10-CM | POA: Diagnosis not present

## 2022-02-12 MED ORDER — SODIUM CHLORIDE 0.9 % IV SOLN: Freq: Once | INTRAVENOUS | Status: AC

## 2022-02-12 MED ORDER — ZOLEDRONIC ACID 4 MG/5ML IV CONC
3.0000 mg | Freq: Once | INTRAVENOUS | Status: AC
  Administered 2022-02-12: 3 mg via INTRAVENOUS
  Filled 2022-02-12: qty 3.75

## 2022-02-12 MED ORDER — HEPARIN SOD (PORK) LOCK FLUSH 100 UNIT/ML IV SOLN
500.0000 [IU] | Freq: Once | INTRAVENOUS | Status: AC | PRN
  Administered 2022-02-12: 500 [IU]

## 2022-02-12 MED ORDER — SODIUM CHLORIDE 0.9 % IV SOLN
200.0000 mg | Freq: Once | INTRAVENOUS | Status: AC
  Administered 2022-02-12: 200 mg via INTRAVENOUS
  Filled 2022-02-12: qty 8

## 2022-02-12 MED ORDER — SODIUM CHLORIDE 0.9% FLUSH
10.0000 mL | INTRAVENOUS | Status: DC | PRN
  Administered 2022-02-12: 10 mL

## 2022-02-12 NOTE — Patient Instructions (Signed)
[facility]  Discharge Instructions: ?Thank you for choosing [facility] to provide your oncology and hematology care.  ?If you have a lab appointment with the [facility], please go directly to the [facility] and check in at the registration area. ?  ?Wear comfortable clothing and clothing appropriate for easy access to any Portacath or PICC line.  ? ?We strive to give you quality time with your provider. You may need to reschedule your appointment if you arrive late (15 or more minutes).  Arriving late affects you and other patients whose appointments are after yours.  Also, if you miss three or more appointments without notifying the office, you may be dismissed from the clinic at the provider?s discretion.    ?  ?For prescription refill requests, have your pharmacy contact our office and allow 72 hours for refills to be completed.   ? ?Today you received the following chemotherapy and/or immunotherapy agents Pembrolizumab, zoledronic acid    ?  ?To help prevent nausea and vomiting after your treatment, we encourage you to take your nausea medication as directed. ? ?BELOW ARE SYMPTOMS THAT SHOULD BE REPORTED IMMEDIATELY: ?*FEVER GREATER THAN 100.4 F (38 ?C) OR HIGHER ?*CHILLS OR SWEATING ?*NAUSEA AND VOMITING THAT IS NOT CONTROLLED WITH YOUR NAUSEA MEDICATION ?*UNUSUAL SHORTNESS OF BREATH ?*UNUSUAL BRUISING OR BLEEDING ?*URINARY PROBLEMS (pain or burning when urinating, or frequent urination) ?*BOWEL PROBLEMS (unusual diarrhea, constipation, pain near the anus) ?TENDERNESS IN MOUTH AND THROAT WITH OR WITHOUT PRESENCE OF ULCERS (sore throat, sores in mouth, or a toothache) ?UNUSUAL RASH, SWELLING OR PAIN  ?UNUSUAL VAGINAL DISCHARGE OR ITCHING  ? ?Items with * indicate a potential emergency and should be followed up as soon as possible or go to the Emergency Department if any problems should occur. ? ?Please show the CHEMOTHERAPY ALERT CARD or IMMUNOTHERAPY ALERT CARD at  check-in to the Emergency Department and triage nurse. ? ?Should you have questions after your visit or need to cancel or reschedule your appointment, please contact [facility]  Dept: [phone]  and follow the prompts.  Office hours are 8:00 a.m. to 4:30 p.m. Monday - Friday. Please note that voicemails left after 4:00 p.m. may not be returned until the following business day.  We are closed weekends and major holidays. You have access to a nurse at all times for urgent questions. Please call the main number to the clinic Dept: [phone] and follow the prompts. ? ?For any non-urgent questions, you may also contact your provider using MyChart. We now offer e-Visits for anyone 102 and older to request care online for non-urgent symptoms. For details visit [email or web address]. ?  ?Also download the MyChart app! Go to the app store, search "MyChart", open the app, select [facility], and log in with your MyChart username and password. ? ?Due to Covid, a mask is required upon entering the hospital/clinic. If you do not have a mask, one will be given to you upon arrival. For doctor visits, patients may have 1 support person aged 26 or older with them. For treatment visits, patients cannot have anyone with them due to current Covid guidelines and our immunocompromised population.  ? ? ?

## 2022-02-13 ENCOUNTER — Other Ambulatory Visit: Payer: Self-pay | Admitting: *Deleted

## 2022-02-13 NOTE — Patient Outreach (Signed)
[facility]) Care Management ? ?02/13/2022 ? ?Robert Hamilton ?[birth year] ?[id number] ? ? ?Referral Date: 3/13 ?Referral Source: Insurance ?Referral Reason: Chronic care management ?Insurance: BCBS ? ? ?Outreach attempt #1, unsuccessful.  Unable to leave message, voice mail full. ? ?Plan: ?RN CM will send outreach letter and follow up within the next 3-4 business days. ? ?Valente David, RN, MSN, CCM ?[facility] Care Management  ?Community Care Manager ?[phone] ? ?

## 2022-02-18 ENCOUNTER — Other Ambulatory Visit: Payer: Self-pay | Admitting: *Deleted

## 2022-02-18 NOTE — Patient Outreach (Addendum)
[facility]) Care Management ? ?02/18/2022 ? ?Robert Hamilton ?[birth year] ?[id number] ? ?  ?Referral Date: 3/13 ?Referral Source: Insurance ?Referral Reason: Chronic care management ?Insurance: BCBS ?  ? ? ?Outreach attempt #2, unsuccessful to both member and daughter.  Member's mailbox is full, unable to leave message.  HIPAA compliant voice message left for daughter.   ? ?Plan: ?RN CM will follow up within the next 3-4 business days. ? ? ? ? ?UPDATE: ? ?Incoming call received back from member's daughter.  Identity verified.  This care manager introduced self and stated purpose of call.  [facility] care management services explained.   ? ?She report member has been adamant about not wanting or needing any assistance with managing his care.  He continues to live alone but she helps him when he needs assistance.  Encouraged to consider program, she agrees to speak with him.  Advised that brochure was mailed out to his home, she will review it with him, agrees to follow up within the next 2 weeks. ? ?Valente David, RN, MSN, CCM ?[facility] Care Management  ?Community Care Manager ?[phone] ? ?

## 2022-02-27 ENCOUNTER — Other Ambulatory Visit: Payer: Self-pay | Admitting: Oncology

## 2022-02-27 DIAGNOSIS — D696 Thrombocytopenia, unspecified: Secondary | ICD-10-CM

## 2022-02-27 NOTE — Progress Notes (Signed)
?[facility]  ?[address] ?[place],  [place]  [address] ?[phone] ? ?Clinic Day:  02/28/2022 ? ?Referring physician: Maryella Shivers, MD ? ?HISTORY OF PRESENT ILLNESS:  ?The patient is an 88 y.o. male with metastatic squamous cell lung cancer.  A brain MRI during a recent hospitalization showed a bilobed right thalamic lesion, consistent with metastatic brain disease.  Despite both him and his family being shown the images depicting his metastatic CNS disease, the patient has elected not to undergo radiation to his brain (including a stereotactic approach).  He comes in today to be evaluated before heading into his 5th cycle of maintenance pembrolizumab.  This was preceded by 4 cycles of chemotherapy, which consisted of carboplatin/nab-paclitaxel/pembrolizumab.  Since his last visit, the patient has been doing fairly well.  He denies having any headaches, vision changes, or altered mental status which concerns him for progression of his metastatic CNS disease.  Of note, he is on Decadron 4 mg 3 times daily to offset the vasogenic edema associated with his metastatic brain disease.  However, he claims his sleep has been altered over these past few weeks, likely due to this agent. ? ?PHYSICAL EXAM: ?Blood pressure (!) 158/75, pulse 85, temperature 97.9 ?F (36.6 ?C), resp. rate 16, height 5\' 9"  (1.753 m), weight 139 lb 9.6 oz (63.3 kg), SpO2 100 %. ?Wt Readings from Last 3 Encounters:  ?02/28/22 139 lb 9.6 oz (63.3 kg)  ?02/12/22 137 lb 12 oz (62.5 kg)  ?02/07/22 139 lb 8 oz (63.3 kg)  ? ?Body mass index is 20.62 kg/m?Marland Kitchen ?Performance status (ECOG): 2 - Symptomatic, <50% confined to bed ?Physical Exam ?Constitutional:   ?   General: He is not in acute distress. ?   Appearance: Normal appearance. He is normal weight.  ?   Comments: Thin gentleman in a wheelchair  ?HENT:  ?   Head: Normocephalic and atraumatic.  ?Eyes:  ?   General: No scleral icterus. ?   Extraocular Movements:  Extraocular movements intact.  ?   Conjunctiva/sclera: Conjunctivae normal.  ?   Pupils: Pupils are equal, round, and reactive to light.  ?Cardiovascular:  ?   Rate and Rhythm: Normal rate and regular rhythm.  ?   Pulses: Normal pulses.  ?   Heart sounds: Normal heart sounds. No murmur heard. ?  No friction rub. No gallop.  ?Pulmonary:  ?   Effort: Pulmonary effort is normal. No respiratory distress.  ?   Breath sounds: Normal breath sounds.  ?Abdominal:  ?   General: Bowel sounds are normal. There is no distension.  ?   Palpations: Abdomen is soft. There is no hepatomegaly, splenomegaly or mass.  ?   Tenderness: There is no abdominal tenderness.  ?Musculoskeletal:     ?   General: Normal range of motion.  ?   Cervical back: Normal range of motion and neck supple.  ?   Right lower leg: No edema.  ?   Left lower leg: No edema.  ?Lymphadenopathy:  ?   Cervical: No cervical adenopathy.  ?Skin: ?   General: Skin is warm and dry.  ?Neurological:  ?   General: No focal deficit present.  ?   Mental Status: He is alert and oriented to person, place, and time. Mental status is at baseline.  ?Psychiatric:     ?   Behavior: Behavior normal.     ?   Thought Content: Thought content normal.     ?   Judgment:  Judgment normal.  ? ?LABS:  ? ? Latest Reference Range & Units 02/28/22 00:00  ?Sodium 137 - 147  141 (E)  ?Potassium 3.5 - 5.1 mEq/L 3.9 (E)  ?Chloride 99 - 108  111 ! (E)  ?CO2 13 - 22  23 ! (E)  ?Glucose  104 (E)  ?BUN 4 - 21  27 ! (E)  ?Creatinine 0.6 - 1.3  1.0 (E)  ?Calcium 8.7 - 10.7  9.1 (E)  ?Alkaline Phosphatase 25 - 125  93 (E)  ?Albumin 3.5 - 5.0  3.5 (E)  ?AST 14 - 40  26 (E)  ?ALT 10 - 40 U/L 33 (E)  ?Bilirubin, Total  0.5 (E)  ?WBC  9.1 (E)  ?RBC 3.87 - 5.11  4.11 (E)  ?Hemoglobin 13.5 - 17.5  10.5 ! (E)  ?HCT 41 - 53  34 ! (E)  ?Platelets 150 - 400 K/uL 214 (E)  ?NEUT#  7.46 (E)  ?!: Data is abnormal ?(E): External lab result ? ?ASSESSMENT & PLAN:  ?An 88 y.o. male with metastatic squamous cell lung cancer,  which includes spread of disease to his right hip and brain.  He will proceed with his 5th cycle of maintenance pembrolizumab early next week.  Once again, I reminded the patient that most forms of treatment have a difficult time permeating the blood-brain barrier to attack metastatic disease in the brain.  He understands radiation is strongly recommended to treat his right thalamic brain lesion.  Despite hearing this, the patient still wished to defer any type of brain radiation.  His daughter, who is with him, is deferring all decision-making to him.  For now, he will continue taking Decadron on a daily basis.  However, I will decrease his Decadron to 4 mg twice daily, as the evening dose will be omitted so he can sleep better at night.  Ambien 10 mg will also be prescribed for better sleep.  Overall, the patient clinically appears to be doing okay.  I will see him back in 3 weeks before he has into his 6th cycle of maintenance pembrolizumab.  The patient understands all the plans discussed today and is in agreement with them.  ? ?Kei Mcelhiney Macarthur Critchley, MD ? ? ? ?  ? ?

## 2022-02-28 ENCOUNTER — Inpatient Hospital Stay: Payer: Medicare Other

## 2022-02-28 ENCOUNTER — Other Ambulatory Visit: Payer: Self-pay | Admitting: Oncology

## 2022-02-28 ENCOUNTER — Inpatient Hospital Stay (INDEPENDENT_AMBULATORY_CARE_PROVIDER_SITE_OTHER): Payer: Medicare Other | Admitting: Oncology

## 2022-02-28 ENCOUNTER — Other Ambulatory Visit: Payer: Medicare Other

## 2022-02-28 ENCOUNTER — Ambulatory Visit: Payer: Medicare Other | Admitting: Oncology

## 2022-02-28 VITALS — BP 158/75 | HR 85 | Temp 97.9°F | Resp 16 | Ht 69.0 in | Wt 139.6 lb

## 2022-02-28 DIAGNOSIS — D696 Thrombocytopenia, unspecified: Secondary | ICD-10-CM | POA: Diagnosis not present

## 2022-02-28 DIAGNOSIS — Z5112 Encounter for antineoplastic immunotherapy: Secondary | ICD-10-CM | POA: Diagnosis not present

## 2022-02-28 DIAGNOSIS — C7931 Secondary malignant neoplasm of brain: Secondary | ICD-10-CM | POA: Diagnosis not present

## 2022-02-28 DIAGNOSIS — D649 Anemia, unspecified: Secondary | ICD-10-CM | POA: Diagnosis not present

## 2022-02-28 DIAGNOSIS — C3492 Malignant neoplasm of unspecified part of left bronchus or lung: Secondary | ICD-10-CM

## 2022-02-28 DIAGNOSIS — Z79899 Other long term (current) drug therapy: Secondary | ICD-10-CM | POA: Diagnosis not present

## 2022-02-28 DIAGNOSIS — C7951 Secondary malignant neoplasm of bone: Secondary | ICD-10-CM | POA: Diagnosis not present

## 2022-02-28 LAB — CBC AND DIFFERENTIAL
HCT: 34 — AB (ref 41–53)
Hemoglobin: 10.5 — AB (ref 13.5–17.5)
Neutrophils Absolute: 7.46
Platelets: 214 10*3/uL (ref 150–400)
WBC: 9.1

## 2022-02-28 LAB — IRON AND TIBC
Iron: 51 ug/dL (ref 45–182)
Saturation Ratios: 16 % — ABNORMAL LOW (ref 17.9–39.5)
TIBC: 323 ug/dL (ref 250–450)
UIBC: 272 ug/dL

## 2022-02-28 LAB — BASIC METABOLIC PANEL
BUN: 27 — AB (ref 4–21)
CO2: 23 — AB (ref 13–22)
Chloride: 111 — AB (ref 99–108)
Creatinine: 1 (ref 0.6–1.3)
Glucose: 104
Potassium: 3.9 mEq/L (ref 3.5–5.1)
Sodium: 141 (ref 137–147)

## 2022-02-28 LAB — HEPATIC FUNCTION PANEL
ALT: 33 U/L (ref 10–40)
AST: 26 (ref 14–40)
Alkaline Phosphatase: 93 (ref 25–125)
Bilirubin, Total: 0.5

## 2022-02-28 LAB — TSH: TSH: 2.956 u[IU]/mL (ref 0.350–4.500)

## 2022-02-28 LAB — FERRITIN: Ferritin: 169 ng/mL (ref 24–336)

## 2022-02-28 LAB — COMPREHENSIVE METABOLIC PANEL
Albumin: 3.5 (ref 3.5–5.0)
Calcium: 9.1 (ref 8.7–10.7)

## 2022-02-28 LAB — CBC: RBC: 4.11 (ref 3.87–5.11)

## 2022-02-28 MED ORDER — ZOLPIDEM TARTRATE 10 MG PO TABS: 10.0000 mg | ORAL_TABLET | Freq: Every evening | ORAL | 0 refills | Status: DC | PRN

## 2022-03-01 ENCOUNTER — Other Ambulatory Visit: Payer: Self-pay | Admitting: *Deleted

## 2022-03-01 LAB — T4: T4, Total: 6.6 ug/dL (ref 4.5–12.0)

## 2022-03-01 NOTE — Patient Outreach (Signed)
[facility]) Care Management ? ?03/01/2022 ? ?Brentyn Seehafer ?02-24-1934 ?[id number] ? ? ?Referral Date: 3/13 ?Referral Source: Insurance ?Referral Reason: Chronic care management ?Insurance: BCBS ?  ?  ?  ?Outreach attempt #3 to follow up on decision for [facility] involvement, unsuccessful to both member and daughter.  Member's mailbox is full, unable to leave message.  HIPAA compliant voice message left for daughter.   ?  ?Plan: ?Will make 4th and final attempt within the next 3 weeks, if remain unsuccessful will close case due to inability to maintain contact. ? ?Valente David, RN, MSN, CCM ?[facility] Care Management  ?Community Care Manager ?[phone] ? ?

## 2022-03-04 ENCOUNTER — Encounter: Payer: Self-pay | Admitting: Oncology

## 2022-03-05 ENCOUNTER — Inpatient Hospital Stay: Payer: Medicare Other | Attending: Hematology and Oncology

## 2022-03-05 VITALS — BP 143/96 | HR 80 | Temp 97.9°F | Resp 18 | Ht 69.0 in | Wt 136.5 lb

## 2022-03-05 DIAGNOSIS — C3492 Malignant neoplasm of unspecified part of left bronchus or lung: Secondary | ICD-10-CM | POA: Insufficient documentation

## 2022-03-05 DIAGNOSIS — Z5112 Encounter for antineoplastic immunotherapy: Secondary | ICD-10-CM | POA: Insufficient documentation

## 2022-03-05 DIAGNOSIS — C7931 Secondary malignant neoplasm of brain: Secondary | ICD-10-CM | POA: Insufficient documentation

## 2022-03-05 DIAGNOSIS — Z79899 Other long term (current) drug therapy: Secondary | ICD-10-CM | POA: Diagnosis not present

## 2022-03-05 DIAGNOSIS — C7951 Secondary malignant neoplasm of bone: Secondary | ICD-10-CM | POA: Insufficient documentation

## 2022-03-05 MED ORDER — SODIUM CHLORIDE 0.9 % IV SOLN
200.0000 mg | Freq: Once | INTRAVENOUS | Status: AC
  Administered 2022-03-05: 200 mg via INTRAVENOUS
  Filled 2022-03-05: qty 8

## 2022-03-05 MED ORDER — HEPARIN SOD (PORK) LOCK FLUSH 100 UNIT/ML IV SOLN
500.0000 [IU] | Freq: Once | INTRAVENOUS | Status: AC | PRN
  Administered 2022-03-05: 500 [IU]

## 2022-03-05 MED ORDER — SODIUM CHLORIDE 0.9 % IV SOLN: Freq: Once | INTRAVENOUS | Status: AC

## 2022-03-05 MED ORDER — SODIUM CHLORIDE 0.9% FLUSH
10.0000 mL | INTRAVENOUS | Status: DC | PRN
  Administered 2022-03-05: 10 mL

## 2022-03-05 MED ORDER — ZOLEDRONIC ACID 4 MG/5ML IV CONC
3.0000 mg | Freq: Once | INTRAVENOUS | Status: AC
  Administered 2022-03-05: 3 mg via INTRAVENOUS
  Filled 2022-03-05: qty 3.75

## 2022-03-05 NOTE — Patient Instructions (Signed)
[facility]  Discharge Instructions: ?Thank you for choosing [facility] to provide your oncology and hematology care.  ?If you have a lab appointment with the [facility], please go directly to the [facility] and check in at the registration area. ?  ?Wear comfortable clothing and clothing appropriate for easy access to any Portacath or PICC line.  ? ?We strive to give you quality time with your provider. You may need to reschedule your appointment if you arrive late (15 or more minutes).  Arriving late affects you and other patients whose appointments are after yours.  Also, if you miss three or more appointments without notifying the office, you may be dismissed from the clinic at the provider?s discretion.    ?  ?For prescription refill requests, have your pharmacy contact our office and allow 72 hours for refills to be completed.   ? ?Today you received the following chemotherapy and/or immunotherapy agents Pembrolizumab    ?  ?To help prevent nausea and vomiting after your treatment, we encourage you to take your nausea medication as directed. ? ?BELOW ARE SYMPTOMS THAT SHOULD BE REPORTED IMMEDIATELY: ?*FEVER GREATER THAN 100.4 F (38 ?C) OR HIGHER ?*CHILLS OR SWEATING ?*NAUSEA AND VOMITING THAT IS NOT CONTROLLED WITH YOUR NAUSEA MEDICATION ?*UNUSUAL SHORTNESS OF BREATH ?*UNUSUAL BRUISING OR BLEEDING ?*URINARY PROBLEMS (pain or burning when urinating, or frequent urination) ?*BOWEL PROBLEMS (unusual diarrhea, constipation, pain near the anus) ?TENDERNESS IN MOUTH AND THROAT WITH OR WITHOUT PRESENCE OF ULCERS (sore throat, sores in mouth, or a toothache) ?UNUSUAL RASH, SWELLING OR PAIN  ?UNUSUAL VAGINAL DISCHARGE OR ITCHING  ? ?Items with * indicate a potential emergency and should be followed up as soon as possible or go to the Emergency Department if any problems should occur. ? ?Please show the CHEMOTHERAPY ALERT CARD or IMMUNOTHERAPY ALERT CARD at check-in to the  Emergency Department and triage nurse. ? ?Should you have questions after your visit or need to cancel or reschedule your appointment, please contact [facility]  Dept: [phone]  and follow the prompts.  Office hours are 8:00 a.m. to 4:30 p.m. Monday - Friday. Please note that voicemails left after 4:00 p.m. may not be returned until the following business day.  We are closed weekends and major holidays. You have access to a nurse at all times for urgent questions. Please call the main number to the clinic Dept: [phone] and follow the prompts. ? ?For any non-urgent questions, you may also contact your provider using MyChart. We now offer e-Visits for anyone 42 and older to request care online for non-urgent symptoms. For details visit [email or web address]. ?  ?Also download the MyChart app! Go to the app store, search "MyChart", open the app, select [facility], and log in with your MyChart username and password. ? ?Due to Covid, a mask is required upon entering the hospital/clinic. If you do not have a mask, one will be given to you upon arrival. For doctor visits, patients may have 1 support person aged 86 or older with them. For treatment visits, patients cannot have anyone with them due to current Covid guidelines and our immunocompromised population.  ? ?Zoledronic Acid Injection (Hypercalcemia, Oncology) ?What is this medication? ?ZOLEDRONIC ACID (ZOE le dron ik AS id) slows calcium loss from bones. It high calcium levels in the blood from some kinds of cancer. It may be used in other people at risk for bone loss. ?This medicine may be used  for other purposes; ask your health care provider or pharmacist if you have questions. ?COMMON BRAND NAME(S): Zometa ?What should I tell my care team before I take this medication? ?They need to know if you have any of these conditions: ?cancer ?dehydration ?dental disease ?kidney disease ?liver disease ?low levels of calcium in the  blood ?lung or breathing disease (asthma) ?receiving steroids like dexamethasone or prednisone ?an unusual or allergic reaction to zoledronic acid, other medicines, foods, dyes, or preservatives ?pregnant or trying to get pregnant ?breast-feeding ?How should I use this medication? ?This drug is injected into a vein. It is given by a health care provider in a hospital or clinic setting. ?Talk to your health care provider about the use of this drug in children. Special care may be needed. ?Overdosage: If you think you have taken too much of this medicine contact a poison control center or emergency room at once. ?NOTE: This medicine is only for you. Do not share this medicine with others. ?What if I miss a dose? ?Keep appointments for follow-up doses. It is important not to miss your dose. Call your health care provider if you are unable to keep an appointment. ?What may interact with this medication? ?certain antibiotics given by injection ?NSAIDs, medicines for pain and inflammation, like ibuprofen or naproxen ?some diuretics like bumetanide, furosemide ?teriparatide ?thalidomide ?This list may not describe all possible interactions. Give your health care provider a list of all the medicines, herbs, non-prescription drugs, or dietary supplements you use. Also tell them if you smoke, drink alcohol, or use illegal drugs. Some items may interact with your medicine. ?What should I watch for while using this medication? ?Visit your health care provider for regular checks on your progress. It may be some time before you see the benefit from this drug. ?Some people who take this drug have severe bone, joint, or muscle pain. This drug may also increase your risk for jaw problems or a broken thigh bone. Tell your health care provider right away if you have severe pain in your jaw, bones, joints, or muscles. Tell you health care provider if you have any pain that does not go away or that gets worse. ?Tell your dentist and  dental surgeon that you are taking this drug. You should not have major dental surgery while on this drug. See your dentist to have a dental exam and fix any dental problems before starting this drug. Take good care of your teeth while on this drug. Make sure you see your dentist for regular follow-up appointments. ?You should make sure you get enough calcium and vitamin D while you are taking this drug. Discuss the foods you eat and the vitamins you take with your health care provider. ?Check with your health care provider if you have severe diarrhea, nausea, and vomiting, or if you sweat a lot. The loss of too much body fluid may make it dangerous for you to take this drug. ?You may need blood work done while you are taking this drug. ?Do not become pregnant while taking this drug. Women should inform their health care provider if they wish to become pregnant or think they might be pregnant. There is potential for serious harm to an unborn child. Talk to your health care provider for more information. ?What side effects may I notice from receiving this medication? ?Side effects that you should report to your doctor or health care provider as soon as possible: ?allergic reactions (skin rash, itching or hives; swelling of  the face, lips, or tongue) ?bone pain ?infection (fever, chills, cough, sore throat, pain or trouble passing urine) ?jaw pain, especially after dental work ?joint pain ?kidney injury (trouble passing urine or change in the amount of urine) ?low blood pressure (dizziness; feeling faint or lightheaded, falls; unusually weak or tired) ?low calcium levels (fast heartbeat; muscle cramps or pain; pain, tingling, or numbness in the hands or feet; seizures) ?low magnesium levels (fast, irregular heartbeat; muscle cramp or pain; muscle weakness; tremors; seizures) ?low red blood cell counts (trouble breathing; feeling faint; lightheaded, falls; unusually weak or tired) ?muscle pain ?redness, blistering,  peeling, or loosening of the skin, including inside the mouth ?severe diarrhea ?swelling of the ankles, feet, hands ?trouble breathing ?Side effects that usually do not require medical attention (report to your

## 2022-03-07 ENCOUNTER — Other Ambulatory Visit: Payer: Self-pay | Admitting: *Deleted

## 2022-03-07 NOTE — Patient Outreach (Signed)
[facility]) Care Management ? ?03/07/2022 ? ?Fletcher Ostermiller ?Jun 06, 1934 ?[id number] ? ? ?Outreach attempt #2 to daughter to follow up on decision for [facility] involvement.  No answer, HIPAA compliant voice message left.  Will follow up with the next 3-4 business days. ? ?Valente David, RN, MSN, CCM ?[facility] Care Management  ?Community Care Manager ?[phone] ? ?

## 2022-03-08 ENCOUNTER — Other Ambulatory Visit: Payer: Self-pay | Admitting: *Deleted

## 2022-03-08 NOTE — Patient Outreach (Signed)
[facility]) Care Management ? ?03/08/2022 ? ?Robert Hamilton ?July 21, 1934 ?[id number] ? ?Voice message received back from daughter after missed call.  Call placed back, she state member continues to decline services at this time.  She will speak with him again as she feels he would benefit from the program and call this care manager is he changes his mind.  Will close case at this time. ? ?Valente David, RN, MSN, CCM ?[facility] Care Management  ?Community Care Manager ?[phone] ? ? ?

## 2022-03-12 ENCOUNTER — Ambulatory Visit: Payer: Self-pay | Admitting: *Deleted

## 2022-03-16 ENCOUNTER — Other Ambulatory Visit: Payer: Self-pay | Admitting: Cardiology

## 2022-03-16 DIAGNOSIS — I5022 Chronic systolic (congestive) heart failure: Secondary | ICD-10-CM

## 2022-03-20 ENCOUNTER — Other Ambulatory Visit: Payer: Self-pay

## 2022-03-20 ENCOUNTER — Other Ambulatory Visit: Payer: Medicare Other

## 2022-03-20 ENCOUNTER — Inpatient Hospital Stay: Payer: Medicare Other

## 2022-03-20 ENCOUNTER — Encounter: Payer: Self-pay | Admitting: Hematology and Oncology

## 2022-03-20 ENCOUNTER — Inpatient Hospital Stay (INDEPENDENT_AMBULATORY_CARE_PROVIDER_SITE_OTHER): Payer: Medicare Other | Admitting: Hematology and Oncology

## 2022-03-20 VITALS — BP 141/68 | HR 80 | Temp 98.1°F | Resp 18 | Ht 69.0 in

## 2022-03-20 DIAGNOSIS — D6481 Anemia due to antineoplastic chemotherapy: Secondary | ICD-10-CM | POA: Diagnosis not present

## 2022-03-20 DIAGNOSIS — T451X5A Adverse effect of antineoplastic and immunosuppressive drugs, initial encounter: Secondary | ICD-10-CM

## 2022-03-20 DIAGNOSIS — C7951 Secondary malignant neoplasm of bone: Secondary | ICD-10-CM | POA: Diagnosis not present

## 2022-03-20 DIAGNOSIS — C7931 Secondary malignant neoplasm of brain: Secondary | ICD-10-CM | POA: Diagnosis not present

## 2022-03-20 DIAGNOSIS — Z79899 Other long term (current) drug therapy: Secondary | ICD-10-CM | POA: Diagnosis not present

## 2022-03-20 DIAGNOSIS — C3492 Malignant neoplasm of unspecified part of left bronchus or lung: Secondary | ICD-10-CM

## 2022-03-20 DIAGNOSIS — Z5112 Encounter for antineoplastic immunotherapy: Secondary | ICD-10-CM | POA: Diagnosis not present

## 2022-03-20 DIAGNOSIS — D649 Anemia, unspecified: Secondary | ICD-10-CM | POA: Diagnosis not present

## 2022-03-20 HISTORY — DX: Secondary malignant neoplasm of brain: C79.31

## 2022-03-20 LAB — BASIC METABOLIC PANEL
BUN: 22 — AB (ref 4–21)
CO2: 20 (ref 13–22)
Chloride: 111 — AB (ref 99–108)
Creatinine: 1 (ref 0.6–1.3)
Glucose: 135
Potassium: 3.2 mEq/L — AB (ref 3.5–5.1)
Sodium: 144 (ref 137–147)

## 2022-03-20 LAB — CBC AND DIFFERENTIAL
HCT: 36 — AB (ref 41–53)
Hemoglobin: 11.2 — AB (ref 13.5–17.5)
Neutrophils Absolute: 7.07
Platelets: 232 10*3/uL (ref 150–400)
WBC: 9.3

## 2022-03-20 LAB — TSH: TSH: 3.168 u[IU]/mL (ref 0.350–4.500)

## 2022-03-20 LAB — HEPATIC FUNCTION PANEL
ALT: 16 U/L (ref 10–40)
AST: 20 (ref 14–40)
Alkaline Phosphatase: 103 (ref 25–125)
Bilirubin, Total: 0.6

## 2022-03-20 LAB — CBC: RBC: 4.37 (ref 3.87–5.11)

## 2022-03-20 LAB — COMPREHENSIVE METABOLIC PANEL
Albumin: 3.7 (ref 3.5–5.0)
Calcium: 8.8 (ref 8.7–10.7)

## 2022-03-20 NOTE — Assessment & Plan Note (Signed)
He continues zoledronic acid every 3 weeks.  He denies pain at this time. ?

## 2022-03-20 NOTE — Assessment & Plan Note (Signed)
He continues epoetin every 21 days. ?

## 2022-03-20 NOTE — Progress Notes (Cosign Needed)
[facility]  [address] [place],  [place]  [address] [phone]  Clinic Day:  03/20/2022  Referring physician: Charlott Rakes, MD  ASSESSMENT & PLAN:   Assessment & Plan: Metastasis to brain [facility]) Brain metastasis diagnosed in February with a solitary bilobed mass in the thalamus.  He declined a radiation to the brain.  He had been taking dexamethasone 4 mg 3 times daily, but this was decreased to twice daily at his last visit per his daughter.  He has worsening neurologic symptoms concerning for progression of brain metastasis.  I will increase his dexamethasone to 3 times daily and repeat MRI brain with and without contrast soon as possible to reevaluate.  If he continues to have a solitary metastasis, we will refer him to radiation therapy at [facility] for consideration of stereotactic radiation therapy.  If he has more widespread disease, we will likely recommend whole brain radiation.  Squamous cell lung cancer, left (HCC) He continues maintenance pembrolizumab every 3 weeks without significant difficulty.  Metastasis to bone [facility]) He continues zoledronic acid every 3 weeks.  He denies pain at this time.  Anemia due to antineoplastic chemotherapy He continues epoetin every 21 days.    The patient understands the plans discussed today and is in agreement with them.  He knows to contact our office if he develops concerns prior to his next appointment.  He will keep his appointment as scheduled on Friday, April 21st.    [name], [name] AT [facility] [address] [place] [address] Dept: [phone] Dept Fax: [phone]   Orders Placed This Encounter  Procedures   MR BRAIN W WO CONTRAST    MCD MCR Epic ORDER NO COVID WT:136 HT:5'8 WHEELCHAIR CAN STAND AND PIVOT/NO CLAUS/ NO METAL REMOVED/ PORT DOES NOT WANT ACCESS/  BULLET IN ABDOMEN HAD PREVIOUS MRI/  NO GLUCOSE MONITOR,  STIMULATOR OR INJECTORS DEFIBRULATOR PORT NO BRAIN CLIP / NO PREV SX/ NO BRAIN HEART EYE OR EAR SX NKDA TO IV DYE CONTRAST DP AND PT 03-20-2022 ** PT AWARE OF 75$ NO SHOW FEE**     Standing Status:   Future    Standing Expiration Date:   03/21/2023    Order Specific Question:   If indicated for the ordered procedure, I authorize the administration of contrast media per Radiology protocol    Answer:   Yes    Order Specific Question:   What is the patient's sedation requirement?    Answer:   No Sedation    Order Specific Question:   Does the patient have a pacemaker or implanted devices?    Answer:   No    Order Specific Question:   Radiology Contrast Protocol - do NOT remove file path    Answer:   [email or web address]    Order Specific Question:   Preferred imaging location?    Answer:   [facility] (table limit-550lbs)   CBC and differential    This external order was created through the Results Console.   CBC    This external order was created through the Results Console.   Basic metabolic panel    This external order was created through the Results Console.   Comprehensive metabolic panel    This external order was created through the Results Console.   Hepatic function panel    This external order was created through the Results Console.  CHIEF COMPLAINT:  CC:  Falls, worsening numbness and weakness of left arm and leg  Current Treatment: Maintenance pembrolizumab every 3 weeks  HISTORY OF PRESENT ILLNESS:   Oncology History  Squamous cell lung cancer, left (HCC)  06/26/2021 Initial Diagnosis   Squamous cell lung cancer, left (HCC)    07/11/2021 Cancer Staging   Staging form: Lung, AJCC 8th Edition - Clinical stage from 07/11/2021: Stage IVA (cT3, cN0, cM1b) - Signed by Weston Settle, MD on 07/11/2021 Histopathologic type: Squamous cell carcinoma, NOS Stage prefix: Initial diagnosis    09/18/2021 -  09/18/2021 Chemotherapy   Patient is on Treatment Plan : LUNG NSCLC Carboplatin + Paclitaxel + Pembrolizumab q21d x 4 cycles / Pembrolizumab Maintenance Q21D       10/09/2021 -  Chemotherapy   Patient is on Treatment Plan : LUNG NSCLC CARBOplatin + Abraxane + Pembrolizumab q21d X 4 Cycles / Pembrolizumab Maintenance q21d       01/15/2022 Imaging   MRI brain with and without contrast  IMPRESSION: Lobulated enhancing lesion or 2 adjacent subcentimeter enhancing lesions in the right thalamus with surrounding edema, concerning for metastatic disease. No significant mass effect or midline shift.    01/28/2022 Imaging   MRI brain with and without contrast  IMPRESSION: Stable bilobed right thalamic lesion and associated edema. No new abnormality.       INTERVAL HISTORY:  Robert Hamilton is added to the schedule today as he telephoned reporting falls this morning.  He denies dizziness.  He reports blurry vision.  He also reports worsening numbness and weakness in the left arm and leg.   He denies fevers or chills. He reports mild lower abdominal discomfort since he fell this morning.  He states his bowel movements are regular. His appetite is good. His weight has decreased 3 pounds over last 3 weeks .  Up until today, he has been ambulating with a walker, but due to his falls, he is hesitant to ambulate.  He has known brain metastasis diagnosed in February, for which he is currently on dexamethasone 4 mg twice daily, as he declined radiation therapy to the brain.  He states he is willing to undergo radiation at this time.  REVIEW OF SYSTEMS:  Review of Systems  Constitutional:  Positive for fatigue. Negative for appetite change, chills, fever and unexpected weight change.  HENT:   Negative for lump/mass, mouth sores and sore throat.   Respiratory:  Negative for cough and shortness of breath.   Cardiovascular:  Negative for chest pain and leg swelling.  Gastrointestinal:  Positive for abdominal pain  (Lower abdominal discomfort). Negative for constipation, diarrhea, nausea and vomiting.  Genitourinary:  Negative for difficulty urinating, dysuria, frequency and hematuria.   Musculoskeletal:  Negative for arthralgias, back pain and myalgias.  Skin:  Negative for itching, rash and wound.  Neurological:  Positive for extremity weakness (Left arm and leg) and numbness (Left arm and leg). Negative for dizziness, headaches and light-headedness.  Hematological:  Negative for adenopathy.  Psychiatric/Behavioral:  Negative for depression and sleep disturbance. The patient is not nervous/anxious.     VITALS:  Blood pressure (!) 141/68, pulse 80, temperature 98.1 F (36.7 C), temperature source Oral, resp. rate 18, height 5\' 9"  (1.753 m), SpO2 99 %.  Wt Readings from Last 3 Encounters:  03/05/22 136 lb 8 oz (61.9 kg)  02/28/22 139 lb 9.6 oz (63.3 kg)  02/12/22 137 lb 12 oz (62.5 kg)    Body mass index is 20.16 kg/m.  Performance  status (ECOG): 3 - Symptomatic, >50% confined to bed  PHYSICAL EXAM:  Physical Exam Vitals and nursing note reviewed.  Constitutional:      General: He is not in acute distress.    Appearance: Normal appearance. He is normal weight.  HENT:     Head: Normocephalic and atraumatic.     Mouth/Throat:     Mouth: Mucous membranes are moist.     Pharynx: Oropharynx is clear. No oropharyngeal exudate or posterior oropharyngeal erythema.  Eyes:     General: No scleral icterus.    Extraocular Movements: Extraocular movements intact.     Conjunctiva/sclera: Conjunctivae normal.     Pupils: Pupils are equal, round, and reactive to light.  Cardiovascular:     Rate and Rhythm: Normal rate and regular rhythm.     Heart sounds: Normal heart sounds. No murmur heard.   No friction rub. No gallop.  Pulmonary:     Effort: Pulmonary effort is normal.     Breath sounds: Normal breath sounds. No wheezing, rhonchi or rales.  Abdominal:     General: Bowel sounds are normal. There  is no distension.     Palpations: Abdomen is soft. There is no hepatomegaly, splenomegaly or mass.     Tenderness: There is no abdominal tenderness.  Musculoskeletal:        General: Normal range of motion.     Cervical back: Normal range of motion and neck supple. No tenderness.     Right lower leg: No edema.     Left lower leg: No edema.  Lymphadenopathy:     Cervical: No cervical adenopathy.     Upper Body:     Right upper body: No supraclavicular or axillary adenopathy.     Left upper body: No supraclavicular or axillary adenopathy.     Lower Body: No right inguinal adenopathy. No left inguinal adenopathy.  Skin:    General: Skin is warm and dry.     Coloration: Skin is not jaundiced.     Findings: No rash.  Neurological:     Mental Status: He is alert and oriented to person, place, and time.     Cranial Nerves: No cranial nerve deficit.     Sensory: Sensory deficit (Left distal arm and left proximal leg) present.     Motor: Motor function is intact.     Coordination: Coordination abnormal (Left arm).  Psychiatric:        Mood and Affect: Mood normal.        Behavior: Behavior normal.        Thought Content: Thought content normal.    LABS:      Latest Ref Rng & Units 03/20/2022   12:00 AM 02/28/2022   12:00 AM 02/07/2022   12:00 AM  CBC  WBC  9.3      9.1      9.4    Hemoglobin 13.5 - 17.5 11.2      10.5      10.5    Hematocrit 41 - 53 36      34      34    Platelets 150 - 400 K/uL 232      214      192       This result is from an external source.      Latest Ref Rng & Units 03/20/2022   12:00 AM 02/28/2022   12:00 AM 02/07/2022   12:00 AM  CMP  BUN 4 - 21 22  27      23    Creatinine 0.6 - 1.3 1.0      1.0      1.0    Sodium 137 - 147 144      141      140    Potassium 3.5 - 5.1 mEq/L 3.2      3.9      3.9    Chloride 99 - 108 111      111      110    CO2 13 - 22 20      23      24     Calcium 8.7 - 10.7 8.8      9.1      9.1    Alkaline Phos 25 - 125 103       93      85    AST 14 - 40 20      26      17     ALT 10 - 40 U/L 16      33      16       This result is from an external source.     No results found for: CEA1 / No results found for: CEA1 No results found for: PSA1 No results found for: WUJ811 No results found for: BJY782  Lab Results  Component Value Date   TOTALPROTELP 6.7 06/26/2021   Lab Results  Component Value Date   TIBC 323 02/28/2022   TIBC 179 (L) 09/27/2021   FERRITIN 169 02/28/2022   FERRITIN 574 (H) 09/27/2021   IRONPCTSAT 16 (L) 02/28/2022   IRONPCTSAT 13 (L) 09/27/2021   Lab Results  Component Value Date   LDH 120 01/13/2022    STUDIES:  No results found.    HISTORY:   Past Medical History:  Diagnosis Date   Anemia    Anemia 09/27/2021   Cancer (HCC)    CHF (congestive heart failure) (HCC)    Chronic kidney disease    COPD (chronic obstructive pulmonary disease) (HCC)    Diverticulosis    GERD (gastroesophageal reflux disease)    Hypertension    Metastasis to brain (HCC) 03/20/2022    Past Surgical History:  Procedure Laterality Date   CRYOTHERAPY  06/26/2021   Procedure: CRYOTHERAPY;  Surgeon: Josephine Igo, DO;  Location: [facility];  Service: Pulmonary;;   HEMOSTASIS CONTROL  06/26/2021   Procedure: HEMOSTASIS CONTROL;  Surgeon: Josephine Igo, DO;  Location: [facility];  Service: Pulmonary;;   HERNIA REPAIR     PROSTATE SURGERY     PROSTATECTOMY     VIDEO BRONCHOSCOPY Left 06/26/2021   Procedure: VIDEO BRONCHOSCOPY WITHOUT FLUORO;  Surgeon: Josephine Igo, DO;  Location: [facility];  Service: Pulmonary;  Laterality: Left;  possible cryotherapy    Family History  Problem Relation Age of Onset   Heart attack Mother    Hypertension Mother    Hypertension Father    Hypertension Sister    Hypertension Sister    Hypertension Sister    Hypertension Sister     Social History:  reports that he quit smoking about 15 months ago. His smoking use included cigarettes. He  smoked an average of .25 packs per day. He has never used smokeless tobacco. He reports that he does not currently use alcohol. He reports that he does not use drugs.The patient is accompanied by his daughter today.  Allergies:  Allergies  Allergen Reactions  Paclitaxel Shortness Of Breath    Current Medications: Current Outpatient Medications  Medication Sig Dispense Refill   acetaminophen (TYLENOL) 325 MG tablet Take 2 tablets (650 mg total) by mouth every 6 (six) hours as needed for mild pain or fever (or Fever >/= 101).     albuterol (VENTOLIN HFA) 108 (90 Base) MCG/ACT inhaler 2 puffs every 6 (six) hours as needed. Shortness of breath     allopurinol (ZYLOPRIM) 100 MG tablet Take 100 mg by mouth daily.     dexamethasone (DECADRON) 4 MG tablet One tab po TID 90 tablet 1   ENTRESTO 24-26 MG TAKE 1 TABLET BY MOUTH TWICE DAILY 60 tablet 1   folic acid (FOLVITE) 1 MG tablet Take 1 mg by mouth daily.     metoprolol succinate (TOPROL-XL) 25 MG 24 hr tablet TAKE 1/2 TABLET(12.5 MG) BY MOUTH DAILY 30 tablet 1   ondansetron (ZOFRAN) 4 MG tablet Take 1 tablet (4 mg total) by mouth every 4 (four) hours as needed for nausea. 90 tablet 3   Oxycodone HCl 10 MG TABS Take 1 tablet (10 mg total) by mouth every 4 (four) hours as needed. 120 tablet 0   pantoprazole (PROTONIX) 40 MG tablet Take 40 mg by mouth daily.     prochlorperazine (COMPAZINE) 10 MG tablet Take 1 tablet (10 mg total) by mouth every 6 (six) hours as needed for nausea or vomiting. 90 tablet 3   thiamine (VITAMIN B-1) 100 MG tablet Take 100 mg by mouth daily.     zolpidem (AMBIEN) 10 MG tablet Take 1 tablet (10 mg total) by mouth at bedtime as needed for sleep. 30 tablet 0   No current facility-administered medications for this visit.

## 2022-03-20 NOTE — Assessment & Plan Note (Addendum)
Brain metastasis diagnosed in February with a solitary bilobed mass in the thalamus.  He declined a radiation to the brain.  He had been taking dexamethasone 4 mg 3 times daily, but this was decreased to twice daily at his last visit per his daughter.  He has worsening neurologic symptoms concerning for progression of brain metastasis.  I will increase his dexamethasone to 3 times daily and repeat MRI brain with and without contrast soon as possible to reevaluate.  If he continues to have a solitary metastasis, we will refer him to radiation therapy at [facility] for consideration of stereotactic radiation therapy.  If he has more widespread disease, we will likely recommend whole brain radiation. ?

## 2022-03-20 NOTE — Assessment & Plan Note (Signed)
He continues maintenance pembrolizumab every 3 weeks without significant difficulty. ?

## 2022-03-21 ENCOUNTER — Ambulatory Visit
Admission: RE | Admit: 2022-03-21 | Discharge: 2022-03-21 | Disposition: A | Discharge status: Home / Self Care | Payer: Medicare Other | Source: Ambulatory Visit | Attending: Hematology and Oncology | Admitting: Hematology and Oncology

## 2022-03-21 DIAGNOSIS — J3489 Other specified disorders of nose and nasal sinuses: Secondary | ICD-10-CM | POA: Diagnosis not present

## 2022-03-21 DIAGNOSIS — G936 Cerebral edema: Secondary | ICD-10-CM | POA: Diagnosis not present

## 2022-03-21 DIAGNOSIS — C7931 Secondary malignant neoplasm of brain: Secondary | ICD-10-CM

## 2022-03-21 DIAGNOSIS — J341 Cyst and mucocele of nose and nasal sinus: Secondary | ICD-10-CM | POA: Diagnosis not present

## 2022-03-21 IMAGING — MR MR HEAD WO/W CM
15 series · 48 of 48 positions shown · IV contrast (12 ml multihance)
Comparison: Brain MRI [DATE]

CLINICAL DATA: Known metastatic disease, follow-up

EXAM:
MRI HEAD WITHOUT AND WITH CONTRAST
TECHNIQUE: Multiplanar, multiecho pulse sequences of the brain and surrounding
structures were obtained without and with intravenous contrast.
CONTRAST:  12mL MULTIHANCE GADOBENATE DIMEGLUMINE 529 MG/ML IV SOLN

[Series 5: T1 · sagittal · 4.0mm · 0.75mm/px · 2 of 31 slices shown (1 of 3)]
[im 1/31]
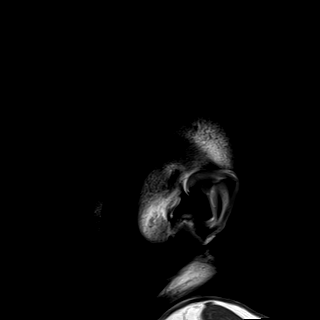
[im 31/31]
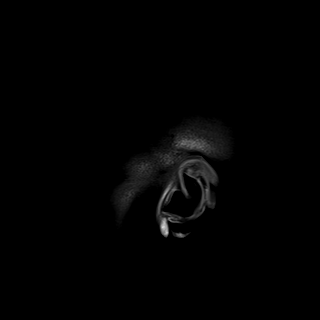

[Series 6: DWI · axial · 3.0mm · 0.94mm/px · z∈[-68,+72]mm · 7 of 160 slices shown (1 of 3)]
[im 1/160]
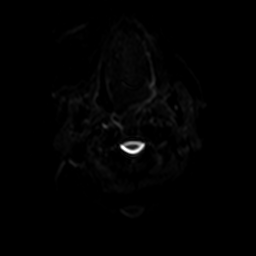
[im 27/160]
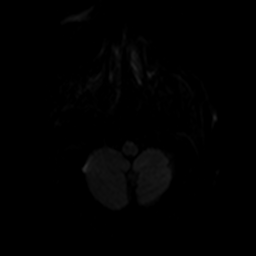
[im 54/160]
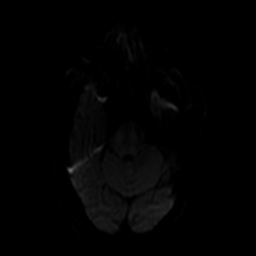
[im 80/160]
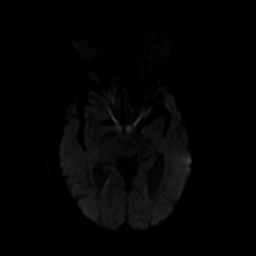
[im 107/160]
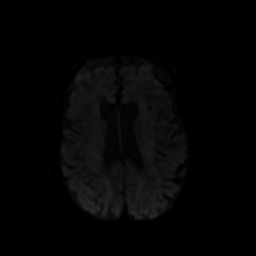
[im 133/160]
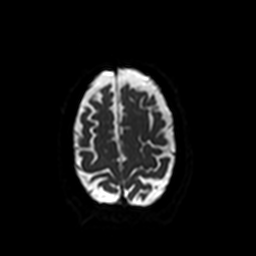
[im 160/160]
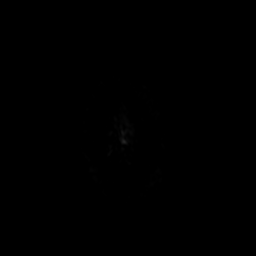

[Series 7: ax dwi_tracew · axial · 3.0mm · 0.94mm/px · z∈[-68,+72]mm · 3 of 80 slices shown]
[im 1/80]
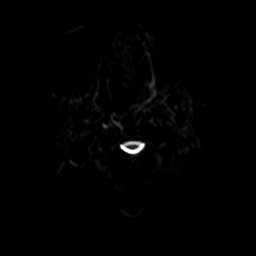
[im 40/80]
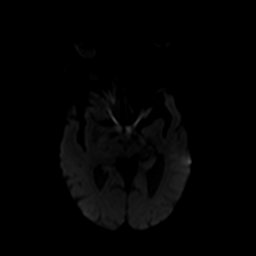
[im 80/80]
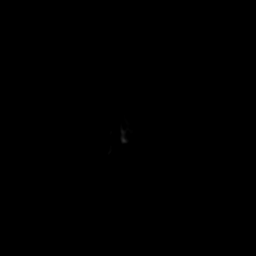

[Series 8: ax dwi_adc · axial · 3.0mm · 0.94mm/px · z∈[-68,+72]mm · 2 of 40 slices shown]
[im 1/40]
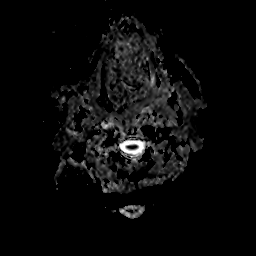
[im 40/40]
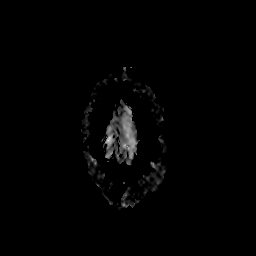

[Series 9: DWI · coronal · 5.0mm · 1.44mm/px · 3 of 60 slices shown (2 of 3)]
[im 1/60]
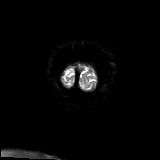
[im 30/60]
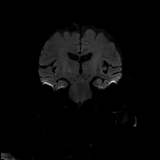
[im 60/60]
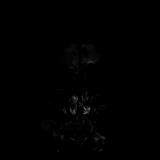

[Series 10: DWI · coronal · 5.0mm · 1.44mm/px · 1 of 30 slices shown (3 of 3)]
[im 1/30]
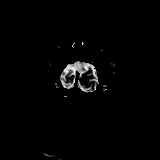

[Series 11: T2 · axial · 4.0mm · 0.36mm/px · 1 of 27 slices shown]
[im 1/27]
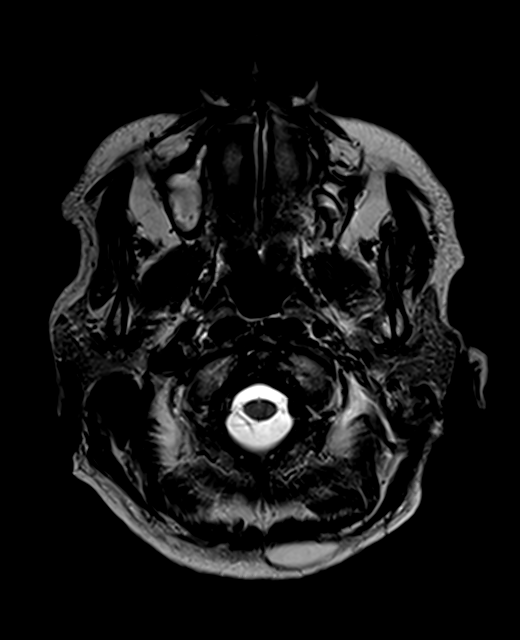

[Series 12: FLAIR · axial · 3.0mm · 0.72mm/px · 1 of 26 slices shown]
[im 1/26]
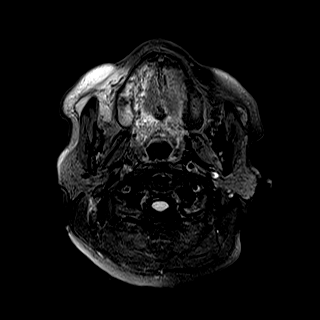

[Series 14: swi_images · axial · 1.5mm · 0.90mm/px · z∈[-48,+91]mm · 4 of 96 slices shown]
[im 1/96]
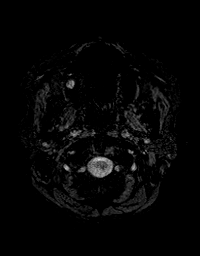
[im 32/96]
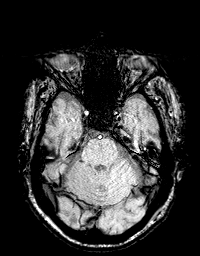
[im 64/96]
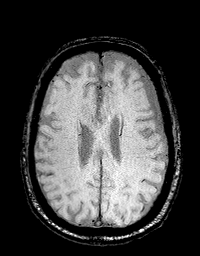
[im 96/96]
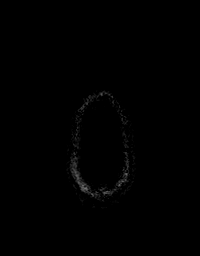

[Series 15: T1 · axial · 1.0mm · 0.94mm/px · z∈[-65,+91]mm · 7 of 160 slices shown (2 of 3)]
[im 1/160]
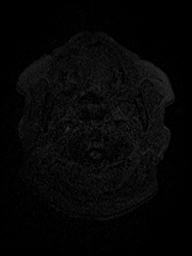
[im 27/160]
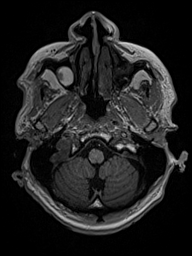
[im 54/160]
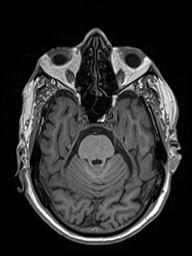
[im 80/160]
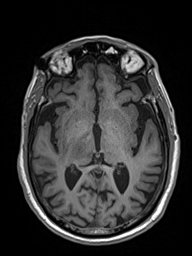
[im 107/160]
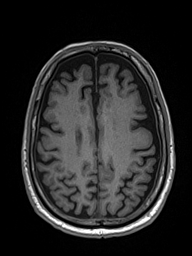
[im 133/160]
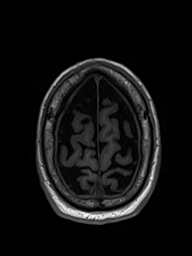
[im 160/160]
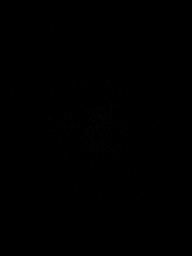

[Series 16: T2 post-contrast · coronal · 4.0mm · 0.36mm/px · 1 of 34 slices shown]
[im 1/34]
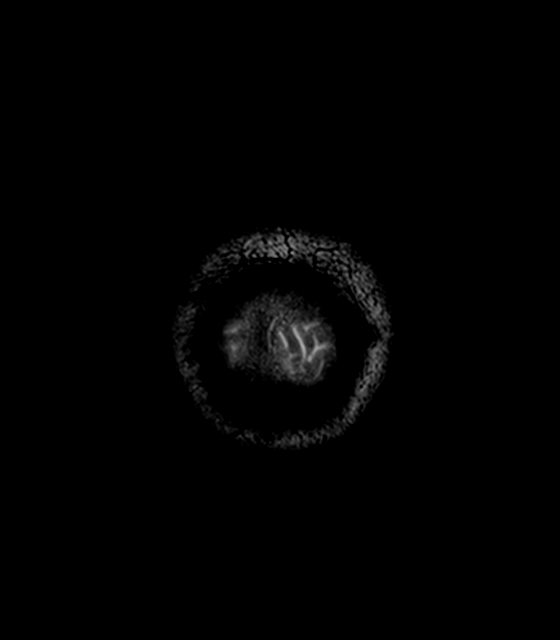

[Series 17: T1 post-contrast · axial · 1.0mm · 0.94mm/px · z∈[-65,+91]mm · 7 of 160 slices shown (1 of 3)]
[im 1/160]
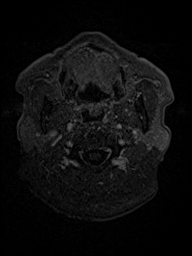
[im 27/160]
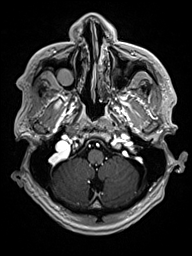
[im 54/160]
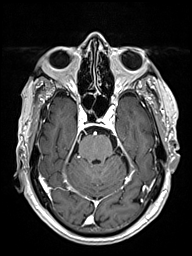
[im 80/160]
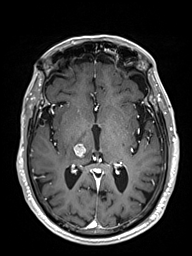
[im 107/160]
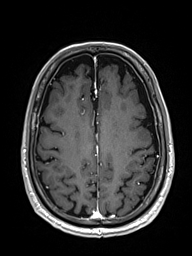
[im 133/160]
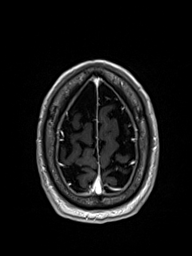
[im 160/160]
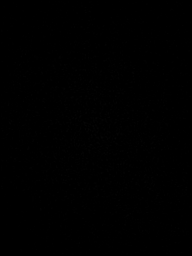

[Series 17: T1 · axial · 1.0mm · 0.94mm/px · z∈[-65,+91]mm · 7 of 160 slices shown (3 of 3)]
[im 1/160]
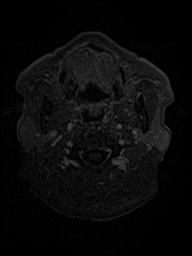
[im 27/160]
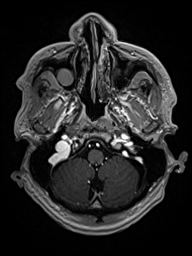
[im 54/160]
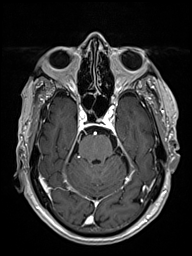
[im 80/160]
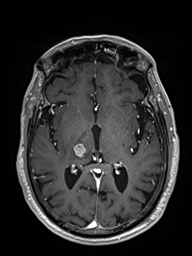
[im 107/160]
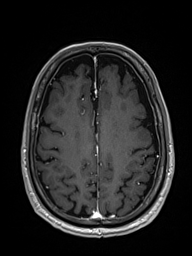
[im 133/160]
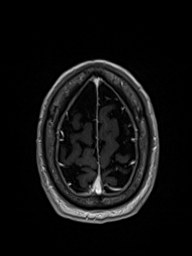
[im 160/160]
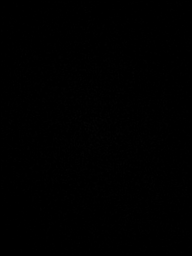

[Series 18: T1 post-contrast · coronal · 4.0mm · 0.72mm/px · 1 of 34 slices shown (2 of 3)]
[im 1/34]
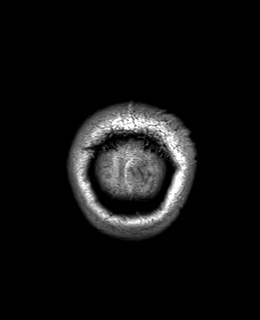

[Series 19: T1 post-contrast · sagittal · 4.0mm · 0.75mm/px · 1 of 31 slices shown (3 of 3)]
[im 1/31]
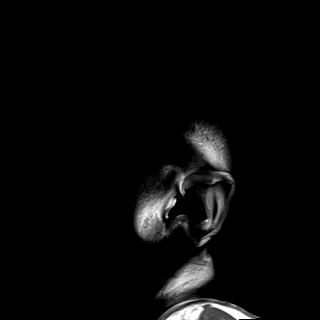

[48 of 48 positions shown; findings below may reference images not displayed]

FINDINGS: Brain: The peripherally enhancing right thalamic lesion has
increased in size since [DATE], now measuring 1.7 cm AP by
cm TV in the axial plane, previously measured 1.0 cm x 0.8 cm when
remeasured. A small focus of diffusion restriction in the right
thalamus is most likely related to the metastatic lesion as opposed
to acute ischemia. Edema surrounding the lesion has also increased,
now extending throughout much of the posterior limb of the internal
capsule and inferiorly into the right cerebral peduncle. There is
mild regional mass effect with inward bowing of the right wall of
third ventricle. There is no hydrocephalus. There is no midline
shift.

No new lesions are seen.

There is no evidence of acute intracranial hemorrhage, extra-axial
fluid collection, or infarct. The ventricles are stable in size.
Scattered foci of FLAIR signal abnormality in the subcortical and
periventricular white matter are nonspecific but again most likely
reflects sequela of mild chronic white matter microangiopathy. A
small focus of FLAIR signal abnormality along the ventral medulla is
unchanged without associated enhancement or diffusion restriction.

Vascular: Normal flow voids.

Skull and upper cervical spine: There are no suspicious marrow
signal abnormality. There is degenerative change at C3-C4.

Sinuses/Orbits: There is mild mucosal thickening throughout the
paranasal sinuses with the right maxillary sinus mucous retention
cyst. The globes and orbits are unremarkable.

Other: None.
IMPRESSION: 1. Increased size of the metastatic lesion in the right thalamus
since [DATE] with worsened surrounding edema. There is mild
regional mass effect with no midline shift.
2. No new lesions identified.

## 2022-03-21 MED ORDER — GADOBENATE DIMEGLUMINE 529 MG/ML IV SOLN
12.0000 mL | Freq: Once | INTRAVENOUS | Status: AC | PRN
  Administered 2022-03-21: 12 mL via INTRAVENOUS

## 2022-03-21 NOTE — Progress Notes (Signed)
?Robert Hamilton  ?[address] ?[place],  [place]  [address] ?[phone] ? ?Clinic Day:  03/22/2022 ? ?Referring physician: Maryella Shivers, MD ? ?HISTORY OF PRESENT ILLNESS:  ?The patient is an 88 y.o. male with metastatic squamous cell lung cancer.  A brain MRI during a recent hospitalization showed a bilobed right thalamic lesion, consistent with metastatic brain disease.  He comes in today to go over his brain MRI, which was done as he has begun falling.  His baseline balance has also been suboptimal.  Despite both him and his family being shown the images depicting his metastatic CNS disease, the patient has not elected to undergo radiation to treat his metastatic brain disease.  He continues to take Decadron for the vasogenic edema associated with his metastatic brain disease.  Overall, the patient claims to feel weaker.  He believes his immunotherapy is contributing to this to where he wishes to stop these treatments.    ? ?PHYSICAL EXAM: ?Blood pressure 124/70, pulse (!) 104, temperature 97.9 ?F (36.6 ?C), resp. rate 16, height 5\' 9"  (1.753 m), weight 138 lb 14.4 oz (63 kg), SpO2 92 %. ?Wt Readings from Last 3 Encounters:  ?03/22/22 138 lb 14.4 oz (63 kg)  ?03/05/22 136 lb 8 oz (61.9 kg)  ?02/28/22 139 lb 9.6 oz (63.3 kg)  ? ?Body mass index is 20.51 kg/m?Marland Kitchen ?Performance status (ECOG): 2 - Symptomatic, <50% confined to bed ?Physical Exam ?Constitutional:   ?   General: He is not in acute distress. ?   Appearance: Normal appearance. He is normal weight.  ?   Comments: Thin gentleman in a wheelchair  ?HENT:  ?   Head: Normocephalic and atraumatic.  ?Eyes:  ?   General: No scleral icterus. ?   Extraocular Movements: Extraocular movements intact.  ?   Conjunctiva/sclera: Conjunctivae normal.  ?   Pupils: Pupils are equal, round, and reactive to light.  ?Cardiovascular:  ?   Rate and Rhythm: Normal rate and regular rhythm.  ?   Pulses: Normal pulses.  ?   Heart sounds: Normal  heart sounds. No murmur heard. ?  No friction rub. No gallop.  ?Pulmonary:  ?   Effort: Pulmonary effort is normal. No respiratory distress.  ?   Breath sounds: Normal breath sounds.  ?Abdominal:  ?   General: Bowel sounds are normal. There is no distension.  ?   Palpations: Abdomen is soft. There is no hepatomegaly, splenomegaly or mass.  ?   Tenderness: There is no abdominal tenderness.  ?Musculoskeletal:     ?   General: Normal range of motion.  ?   Cervical back: Normal range of motion and neck supple.  ?   Right lower leg: No edema.  ?   Left lower leg: No edema.  ?Lymphadenopathy:  ?   Cervical: No cervical adenopathy.  ?Skin: ?   General: Skin is warm and dry.  ?Neurological:  ?   General: No focal deficit present.  ?   Mental Status: He is alert and oriented to person, place, and time. Mental status is at baseline.  ?Psychiatric:     ?   Behavior: Behavior normal.     ?   Thought Content: Thought content normal.     ?   Judgment: Judgment normal.  ? ?SCANS:  His brain MRI revealed the following: ?FINDINGS: ?Brain: The peripherally enhancing right thalamic lesion has ?increased in size since 01/28/2022, now measuring 1.7 cm AP by 1.3 ?cm TV in  the axial plane, previously measured 1.0 cm x 0.8 cm when ?remeasured. A small focus of diffusion restriction in the right ?thalamus is most likely related to the metastatic lesion as opposed ?to acute ischemia. Edema surrounding the lesion has also increased, ?now extending throughout much of the posterior limb of the internal ?capsule and inferiorly into the right cerebral peduncle. There is ?mild regional mass effect with inward bowing of the right wall of ?third ventricle. There is no hydrocephalus. There is no midline ?shift. ? ?No new lesions are seen. ? ?There is no evidence of acute intracranial hemorrhage, extra-axial ?fluid collection, or infarct. The ventricles are stable in size. ?Scattered foci of FLAIR signal abnormality in the subcortical  and ?periventricular white matter are nonspecific but again most likely ?reflects sequela of mild chronic white matter microangiopathy. A ?small focus of FLAIR signal abnormality along the ventral medulla is ?unchanged without associated enhancement or diffusion restriction. ? ?Vascular: Normal flow voids. ? ?Skull and upper cervical spine: There are no suspicious marrow ?signal abnormality. There is degenerative change at C3-C4. ? ?Sinuses/Orbits: There is mild mucosal thickening throughout the ?paranasal sinuses with the right maxillary sinus mucous retention ?cyst. The globes and orbits are unremarkable. ? ?Other: None. ? ?IMPRESSION: ?1. Increased size of the metastatic lesion in the right thalamus ?since 01/28/2022 with worsened surrounding edema. There is mild ?regional mass effect with no midline shift. ?2. No new lesions identified. ? ?ASSESSMENT & PLAN:  ?An 88 y.o. male with metastatic squamous cell lung cancer, which includes spread of disease to his right hip and brain.  In clinic today, I went over his brain MRI images with him and his family, for which they could see that his right thalamic brain lesion has gotten larger in size.  He understands this is the reason why his balance has gotten worse over these past few weeks despite steroids.  Based upon his neurologic changes and MRI findings, the patient now understands the need to undergo radiation to treat his metastatic brain lesion.  As it is singular in nature, I will refer him to radiation oncology in [facility] to undergo stereotactic radiation.  With respect to systemic therapy, as he feels his pembrolizumab immunotherapy is causing him more fatigue, this therapy will be held for now.  I will see him back in 6 weeks for repeat clinical assessment.  Before that visit, repeat CT scans will be done to ascertain his new disease baseline.  The patient understands all the plans discussed today and is in agreement with them.  ? ?Robert Moomaw Macarthur Critchley,  MD ? ? ? ?  ? ?

## 2022-03-22 ENCOUNTER — Other Ambulatory Visit: Payer: Self-pay | Admitting: Oncology

## 2022-03-22 ENCOUNTER — Other Ambulatory Visit: Payer: Medicare Other

## 2022-03-22 ENCOUNTER — Inpatient Hospital Stay (INDEPENDENT_AMBULATORY_CARE_PROVIDER_SITE_OTHER): Payer: Medicare Other | Admitting: Oncology

## 2022-03-22 VITALS — BP 124/70 | HR 104 | Temp 97.9°F | Resp 16 | Ht 69.0 in | Wt 138.9 lb

## 2022-03-22 DIAGNOSIS — C3492 Malignant neoplasm of unspecified part of left bronchus or lung: Secondary | ICD-10-CM | POA: Diagnosis not present

## 2022-03-22 DIAGNOSIS — E063 Autoimmune thyroiditis: Secondary | ICD-10-CM

## 2022-03-23 ENCOUNTER — Encounter: Payer: Self-pay | Admitting: Oncology

## 2022-03-23 LAB — T4: T4, Total: 6.6 ug/dL (ref 4.5–12.0)

## 2022-03-25 ENCOUNTER — Ambulatory Visit
Admission: RE | Admit: 2022-03-25 | Discharge: 2022-03-25 | Disposition: A | Discharge status: Home / Self Care | Payer: Medicare Other | Source: Ambulatory Visit | Attending: Radiation Oncology | Admitting: Radiation Oncology

## 2022-03-25 ENCOUNTER — Other Ambulatory Visit: Payer: Self-pay | Admitting: Radiation Therapy

## 2022-03-25 ENCOUNTER — Other Ambulatory Visit: Payer: Self-pay

## 2022-03-25 VITALS — Ht 69.0 in | Wt 135.0 lb

## 2022-03-25 DIAGNOSIS — C7931 Secondary malignant neoplasm of brain: Secondary | ICD-10-CM

## 2022-03-25 DIAGNOSIS — S0280XA Fracture of other specified skull and facial bones, unspecified side, initial encounter for closed fracture: Secondary | ICD-10-CM | POA: Insufficient documentation

## 2022-03-25 DIAGNOSIS — C3412 Malignant neoplasm of upper lobe, left bronchus or lung: Secondary | ICD-10-CM | POA: Diagnosis not present

## 2022-03-25 DIAGNOSIS — Z87891 Personal history of nicotine dependence: Secondary | ICD-10-CM | POA: Diagnosis not present

## 2022-03-25 NOTE — Progress Notes (Signed)
Histology and Location of Primary Cancer: Squamous cell left lung cancer ? ?Sites of Visceral and Bony Metastatic Disease: Metastasis to brain and bone ? ?Location(s) of Symptomatic Metastases: Right hip and brain ? ?Past/Anticipated chemotherapy by medical oncology, if any:    ?Current Treatment: Maintenance pembrolizumab every 3 weeks (Lung) ?  ?Pain on a scale of 0-10 is:  0/10 ?Respiratory complaints:  Sob not using oxygen ?Hemoptysis:  no ? ?If Spine Met(s), symptoms, if any, include: ?Bowel/Bladder retention or incontinence (please describe): No ?Numbness or weakness in extremities (please describe): Left arm and left leg numbness/weakness ?Current Decadron regimen, if applicable: Decadron 4 mg po three times a day. ? ?Ambulatory status? Walker? Wheelchair?:  Walker ? ?SAFETY ISSUES: ?Prior radiation?   Yes, 12/2021 ?Pacemaker/ICD? No ?Possible current pregnancy?  Male ?Is the patient on methotrexate? No ? ?Current Complaints / other details:   ? ? ?

## 2022-03-25 NOTE — Progress Notes (Signed)
Histology and Location of Primary Cancer: Squamous cell left lung cancer ? ?Sites of Visceral and Bony Metastatic Disease: Metastasis to brain and bone ? ?Location(s) of Symptomatic Metastases: Right hip ? ?Past/Anticipated chemotherapy by medical oncology, if any:  Current Treatment: Maintenance pembrolizumab every 3 weeks (Lung). ? ?Pain on a scale of 0-10 is:  0 ?Respiratory complaints: No ?Hemoptysis:  No ? ?If Spine Met(s), symptoms, if any, include: ?Bowel/Bladder retention or incontinence (please describe): No ?Numbness or weakness in extremities (please describe): Left arm and left leg numbness/weakness ?Current Decadron regimen, if applicable: Decadron 4 mg po three times a day. ? ?Ambulatory status? Walker? Wheelchair?:  Walker ? ?SAFETY ISSUES: ?Prior radiation? Yes ?Pacemaker/ICD? No ?Possible current pregnancy? Male ?Is the patient on methotrexate? No ? ?Current Complaints / other details:   ? ? ?

## 2022-03-25 NOTE — Progress Notes (Signed)
?Radiation Oncology         [phone] ?________________________________ ? ?Outpatient Follow-Up Re-Evaluation ? ?Conducted via telephone due to current COVID-19 concerns for limiting patient exposure ? ? ?Name: Robert Hamilton MRN: [id number]  ?Date: 03/25/2022  DOB: [birth year] ? ?JZ:PHXTAV, Beatriz Chancellor, MD  [name], MD  ? ?REFERRING PHYSICIAN: [name], MD ? ?DIAGNOSIS: 88 y.o. gentleman with a solitary 1.7 cm right thalamic brain metastasis from PD-L1 positive (TPS=1%) squamous cell carcinoma of the left upper lobe of the lung - Stage IVA ? ?  ICD-10-CM   ?1. Metastasis to brain [facility])  C79.31   ?  ? ? ?HISTORY OF PRESENT ILLNESS: Robert Hamilton is a 88 y.o. male who initially presented to the emergency department here on 06/25/2021 for evaluation of increased shortness of breath and hemoptysis as well as severe right hip pain.  He was previously seen in the emergency department in [place] in June after a fall at home and imaging during that visit demonstrated a left hilar mass with small peripheral nodules, felt likely to represent a small cell lung cancer as well as lytic lesion of the right iliac bone involving the right acetabulum.  He was seen by Dr. Alcide Clever in medical oncology in [place] on 05/14/2021 and scheduled for a PET scan.  His next scheduled follow-up visit was 07/11/2021 but in the interim he had developed worsening shortness of breath and intermittent hemoptysis as well as progressive right hip pain which brought him to the emergency department most recently.  He had not had any tissue biopsy but per his daughter's report, he did have the PET scan in [place] recently.  A repeat CT scan on admission shows a 6 cm left hilar mass with distal occlusion of the left mainstem bronchus extending through the left upper lobe bronchus, contiguous with the hilar mass, a partially occlusive left middle lobe pulmonary embolism without evidence of right heart strain and a large, 8.1 cm  lytic lesion on the right iliac crest extending to the acetabulum.  His right hip pain had been poorly controlled and he has been unable to weight-bear/ambulate due to the pain.  Given the obstructive nature of the lung mass resulting in increased difficulty breathing and hemoptysis, he was taken for bronchoscopy on 06/26/2021 for biopsy/tissue confirmation and debulking of the obstructing endobronchial lesion.  Dr. Valeta Harms was able to successfully debulk the endobronchial tumor and remove approximately 90% of the tumor to reopen the airway.  Pathology showed squamous cell carcinoma PD-L1 positive at 1%.  We saw him in consultation on 06/27/22 during that hospital stay and recommended palliative radiotherapy to the left lung and right hip, and coordinated for him to receive that treatment with Dr. Orlene Erm in [place], which was successfully completed in August.   ? ?He has subsequently been treated with the following regimen: ? ?Oncology History  ?Squamous cell lung cancer, left ([place])  ?06/26/2021 Initial Diagnosis  ? Squamous cell lung cancer, left ([place]) ? ?  ?07/11/2021 Cancer Staging  ? Staging form: Lung, AJCC 8th Edition ?- Clinical stage from 07/11/2021: Stage IVA (cT3, cN0, cM1b) - Signed by [name], MD on 07/11/2021 ?Histopathologic type: Squamous cell carcinoma, NOS ?Stage prefix: Initial diagnosis ? ?  ?09/18/2021 - 09/18/2021 Chemotherapy  ? Patient is on Treatment Plan : LUNG NSCLC Carboplatin + Paclitaxel + Pembrolizumab q21d x 4 cycles / Pembrolizumab Maintenance Q21D  ? ?  ?  ?10/09/2021 -  Chemotherapy  ? Patient is on Treatment  Plan : LUNG NSCLC CARBOplatin + Abraxane + Pembrolizumab q21d X 4 Cycles / Pembrolizumab Maintenance q21d  ? ?  ?  ? ?MRI's of the brain on 01/15/22, 01/28/22 and 03/21/22 have shown a solitary enlarging bi-lobed 1.7 cm right thalamic brain metastasis.  Dr. Bobby Rumpf recommended [place] when it was discovered on 01/18/22, but, the patient refused.  He has become more symptomatic and  agreed to referral today to see me to discuss SRS ? ? ?PREVIOUS RADIATION THERAPY: Yes palliative XRT to left lung and right hip with Dr. Orlene Erm ? ?PAST MEDICAL HISTORY:  ?Past Medical History:  ?Diagnosis Date  ? Anemia   ? Anemia 09/27/2021  ? Cancer [facility])   ? CHF (congestive heart failure) ([place])   ? Chronic kidney disease   ? COPD (chronic obstructive pulmonary disease) ([place])   ? Diverticulosis   ? GERD (gastroesophageal reflux disease)   ? Hypertension   ? Metastasis to brain [facility]) 03/20/2022  ?   ? ?PAST SURGICAL HISTORY: ?Past Surgical History:  ?Procedure Laterality Date  ? CRYOTHERAPY  06/26/2021  ? Procedure: CRYOTHERAPY;  Surgeon: Garner Nash, DO;  Location: [facility];  Service: Pulmonary;;  ? HEMOSTASIS CONTROL  06/26/2021  ? Procedure: HEMOSTASIS CONTROL;  Surgeon: Garner Nash, DO;  Location: [facility];  Service: Pulmonary;;  ? HERNIA REPAIR    ? PROSTATE SURGERY    ? PROSTATECTOMY    ? VIDEO BRONCHOSCOPY Left 06/26/2021  ? Procedure: VIDEO BRONCHOSCOPY WITHOUT FLUORO;  Surgeon: Garner Nash, DO;  Location: [facility];  Service: Pulmonary;  Laterality: Left;  possible cryotherapy  ? ? ?FAMILY HISTORY:  ?Family History  ?Problem Relation Age of Onset  ? Heart attack Mother   ? Hypertension Mother   ? Hypertension Father   ? Hypertension Sister   ? Hypertension Sister   ? Hypertension Sister   ? Hypertension Sister   ? ? ?SOCIAL HISTORY:  ?Social History  ? ?Socioeconomic History  ? Marital status: Divorced  ?  Spouse name: Not on file  ? Number of children: 2  ? Years of education: Not on file  ? Highest education level: Not on file  ?Occupational History  ? Not on file  ?Tobacco Use  ? Smoking status: Former  ?  Packs/day: 0.25  ?  Types: Cigarettes  ?  Quit date: 12/2020  ?  Years since quitting: 1.3  ? Smokeless tobacco: Never  ? Tobacco comments:  ?  1-2 per day  ?Vaping Use  ? Vaping Use: Never used  ?Substance and Sexual Activity  ? Alcohol use: Not Currently  ?  Comment: stopped  12/2018  ? Drug use: No  ? Sexual activity: Yes  ?Other Topics Concern  ? Not on file  ?Social History Narrative  ? Not on file  ? ?Social Determinants of Health  ? ?Financial Resource Strain: Not on file  ?Food Insecurity: Not on file  ?Transportation Needs: Not on file  ?Physical Activity: Not on file  ?Stress: Not on file  ?Social Connections: Not on file  ?Intimate Partner Violence: Not on file  ? ? ?ALLERGIES: Paclitaxel ? ?MEDICATIONS:  ?Current Outpatient Medications  ?Medication Sig Dispense Refill  ? acetaminophen (TYLENOL) 325 MG tablet Take 2 tablets (650 mg total) by mouth every 6 (six) hours as needed for mild pain or fever (or Fever >/= 101).    ? albuterol (VENTOLIN HFA) 108 (90 Base) MCG/ACT inhaler 2 puffs every 6 (six) hours as needed. Shortness of breath    ?  allopurinol (ZYLOPRIM) 100 MG tablet Take 100 mg by mouth daily.    ? dexamethasone (DECADRON) 4 MG tablet One tab po TID 90 tablet 1  ? ENTRESTO 24-26 MG TAKE 1 TABLET BY MOUTH TWICE DAILY 60 tablet 1  ? folic acid (FOLVITE) 1 MG tablet Take 1 mg by mouth daily.    ? metoprolol succinate (TOPROL-XL) 25 MG 24 hr tablet TAKE 1/2 TABLET(12.5 MG) BY MOUTH DAILY 30 tablet 1  ? ondansetron (ZOFRAN) 4 MG tablet Take 1 tablet (4 mg total) by mouth every 4 (four) hours as needed for nausea. 90 tablet 3  ? Oxycodone HCl 10 MG TABS Take 1 tablet (10 mg total) by mouth every 4 (four) hours as needed. 120 tablet 0  ? pantoprazole (PROTONIX) 40 MG tablet Take 40 mg by mouth daily.    ? prochlorperazine (COMPAZINE) 10 MG tablet Take 1 tablet (10 mg total) by mouth every 6 (six) hours as needed for nausea or vomiting. 90 tablet 3  ? thiamine (VITAMIN B-1) 100 MG tablet Take 100 mg by mouth daily.    ? zolpidem (AMBIEN) 10 MG tablet Take 1 tablet (10 mg total) by mouth at bedtime as needed for sleep. 30 tablet 0  ? ?No current facility-administered medications for this encounter.  ? ? ?REVIEW OF SYSTEMS:  On review of systems, the patient reports that he is  doing well overall. He denies any chest pain, shortness of breath, cough, fevers, chills, night sweats, unintended weight changes. He denies any bowel disturbances, and denies abdominal pain, nausea or vomi

## 2022-03-25 NOTE — Progress Notes (Signed)
Received a message from Dr. Orlene Erm, Dr. Bobby Rumpf is requesting this patient be seen by Dr. Tammi Klippel for consideration of SRS to solitary brain met from [place].  ? ?Mont Dutton R.T.(R)(T) ?Radiation Special Procedures Navigator  ?

## 2022-03-26 ENCOUNTER — Telehealth: Payer: Self-pay | Admitting: Radiation Therapy

## 2022-03-26 ENCOUNTER — Ambulatory Visit: Payer: Medicare Other

## 2022-03-26 DIAGNOSIS — Z87891 Personal history of nicotine dependence: Secondary | ICD-10-CM | POA: Diagnosis not present

## 2022-03-26 DIAGNOSIS — C7931 Secondary malignant neoplasm of brain: Secondary | ICD-10-CM | POA: Diagnosis not present

## 2022-03-26 DIAGNOSIS — C3412 Malignant neoplasm of upper lobe, left bronchus or lung: Secondary | ICD-10-CM | POA: Diagnosis not present

## 2022-03-26 NOTE — Progress Notes (Signed)
?  Radiation Oncology         [phone]) [phone] ?________________________________ ? ?Name: Robert Hamilton MRN: [id number]  ?Date: 03/27/2022  DOB: [birth year] ? ?SIMULATION AND TREATMENT PLANNING NOTE ? ?  ICD-10-CM   ?1. Metastasis to brain [facility])  C79.31   ?  ? ? ?DIAGNOSIS:  88 y.o. gentleman with a solitary 1.7 cm right thalamic brain metastasis from PD-L1 positive (TPS=1%) squamous cell carcinoma of the left upper lobe of the lung - Stage IVA ? ?NARRATIVE:  The patient was brought to the [facility].  Identity was confirmed.  All relevant records and images related to the planned course of therapy were reviewed.  The patient freely provided informed written consent to proceed with treatment after reviewing the details related to the planned course of therapy. The consent form was witnessed and verified by the simulation staff. Intravenous access was established for contrast administration. Then, the patient was set-up in a stable reproducible supine position for radiation therapy.  A relocatable thermoplastic stereotactic head frame was fabricated for precise immobilization.  CT images were obtained.  Surface markings were placed.  The CT images were loaded into the planning software and fused with the patient's targeting MRI scan.  Then the target and avoidance structures were contoured.  Treatment planning then occurred.  The radiation prescription was entered and confirmed.  I have requested 3D planning  I have requested a DVH of the following structures: Brain stem, brain, left eye, right eye, lenses, optic chiasm, target volumes, uninvolved brain, and normal tissue.   ? ?SPECIAL TREATMENT PROCEDURE:  The planned course of therapy using radiation constitutes a special treatment procedure. Special care is required in the management of this patient for the following reasons. This treatment constitutes a Special Treatment Procedure for the following reason: High dose per fraction requiring special  monitoring for increased toxicities of treatment including daily imaging.  The special nature of the planned course of radiotherapy will require increased physician supervision and oversight to ensure patient's safety with optimal treatment outcomes.  This requires extended time and effort. ? ?PLAN:  The patient will receive 18 Gy in 1 fraction. ? ?________________________________ ? ?Sheral Apley Tammi Klippel, M.D. ? ?

## 2022-03-26 NOTE — Telephone Encounter (Signed)
Spoke with [name] daughter, Robert Hamilton, about his upcoming radiation treatment planning appointments. Dr. Zada Finders is the neurosurgeon working with Dr. Tammi Klippel for this treatment course. Dr. Zada Finders has agreed to come here the day of treatment to meet with [name] to save them a trip to [place].  ? ?[facility] 4/26- arrive at 12:00 for the IV start and signing consent. ?Treatment 5/1- Meeting with Dr. Zada Finders here @ 2:30, then treat at 3:00. ? ? ?Mont Dutton R.T.(R)(T) ?Radiation Special Procedure Navigator ?

## 2022-03-27 ENCOUNTER — Other Ambulatory Visit: Payer: Self-pay

## 2022-03-27 ENCOUNTER — Ambulatory Visit
Admission: RE | Admit: 2022-03-27 | Discharge: 2022-03-27 | Disposition: A | Discharge status: Home / Self Care | Payer: Medicare Other | Source: Ambulatory Visit | Attending: Radiation Oncology | Admitting: Radiation Oncology

## 2022-03-27 DIAGNOSIS — C7931 Secondary malignant neoplasm of brain: Secondary | ICD-10-CM | POA: Diagnosis not present

## 2022-03-27 DIAGNOSIS — Z87891 Personal history of nicotine dependence: Secondary | ICD-10-CM | POA: Diagnosis not present

## 2022-03-27 DIAGNOSIS — C3412 Malignant neoplasm of upper lobe, left bronchus or lung: Secondary | ICD-10-CM | POA: Insufficient documentation

## 2022-03-27 MED ORDER — SODIUM CHLORIDE 0.9% FLUSH
10.0000 mL | Freq: Once | INTRAVENOUS | Status: AC
  Administered 2022-03-27: 10 mL via INTRAVENOUS

## 2022-03-27 MED ORDER — HEPARIN SOD (PORK) LOCK FLUSH 100 UNIT/ML IV SOLN
500.0000 [IU] | Freq: Once | INTRAVENOUS | Status: AC
  Administered 2022-03-27: 500 [IU] via INTRAVENOUS

## 2022-03-27 NOTE — Progress Notes (Signed)
Has armband been applied?  Yes ? ?Does patient have an allergy to IV contrast dye?: No ?  ?Has patient ever received premedication for IV contrast dye?: No  ? ?Does patient take metformin?: No ? ?If patient does take metformin when was the last dose: NA ? ?Date of lab work: 03/20/2022 ?BUN: 22 ?CR: 1.0 ? ?IV site: Port-a-cath right chest ? ?Has IV site been added to flowsheet?  Yes ? ?Vitals:  97.0 temp, 71 pulse, 20 respirations, 159/78 BP, 99% pulse ox. ?

## 2022-03-28 DIAGNOSIS — Z87891 Personal history of nicotine dependence: Secondary | ICD-10-CM | POA: Diagnosis not present

## 2022-03-28 DIAGNOSIS — C3412 Malignant neoplasm of upper lobe, left bronchus or lung: Secondary | ICD-10-CM | POA: Diagnosis not present

## 2022-03-28 DIAGNOSIS — C7931 Secondary malignant neoplasm of brain: Secondary | ICD-10-CM | POA: Diagnosis not present

## 2022-03-29 DIAGNOSIS — C3412 Malignant neoplasm of upper lobe, left bronchus or lung: Secondary | ICD-10-CM | POA: Diagnosis not present

## 2022-03-29 DIAGNOSIS — Z87891 Personal history of nicotine dependence: Secondary | ICD-10-CM | POA: Diagnosis not present

## 2022-03-29 DIAGNOSIS — C7931 Secondary malignant neoplasm of brain: Secondary | ICD-10-CM | POA: Diagnosis not present

## 2022-04-01 ENCOUNTER — Encounter: Payer: Self-pay | Admitting: Urology

## 2022-04-01 ENCOUNTER — Other Ambulatory Visit: Payer: Self-pay

## 2022-04-01 ENCOUNTER — Ambulatory Visit
Admission: RE | Admit: 2022-04-01 | Discharge: 2022-04-01 | Disposition: A | Discharge status: Home / Self Care | Payer: Medicare Other | Source: Ambulatory Visit | Attending: Radiation Oncology | Admitting: Radiation Oncology

## 2022-04-01 VITALS — BP 134/71 | HR 76 | Temp 97.8°F | Resp 20

## 2022-04-01 DIAGNOSIS — C3490 Malignant neoplasm of unspecified part of unspecified bronchus or lung: Secondary | ICD-10-CM | POA: Diagnosis not present

## 2022-04-01 DIAGNOSIS — Z87891 Personal history of nicotine dependence: Secondary | ICD-10-CM | POA: Diagnosis not present

## 2022-04-01 DIAGNOSIS — Z510 Encounter for antineoplastic radiation therapy: Secondary | ICD-10-CM | POA: Diagnosis not present

## 2022-04-01 DIAGNOSIS — C61 Malignant neoplasm of prostate: Secondary | ICD-10-CM | POA: Diagnosis not present

## 2022-04-01 DIAGNOSIS — F17211 Nicotine dependence, cigarettes, in remission: Secondary | ICD-10-CM | POA: Diagnosis not present

## 2022-04-01 DIAGNOSIS — C7931 Secondary malignant neoplasm of brain: Secondary | ICD-10-CM

## 2022-04-01 DIAGNOSIS — C3412 Malignant neoplasm of upper lobe, left bronchus or lung: Secondary | ICD-10-CM | POA: Diagnosis not present

## 2022-04-01 LAB — RAD ONC ARIA SESSION SUMMARY
Course Elapsed Days: 0
Plan Fractions Treated to Date: 1
Plan Prescribed Dose Per Fraction: 18 Gy
Plan Total Fractions Prescribed: 1
Plan Total Prescribed Dose: 18 Gy
Reference Point Dosage Given to Date: 18 Gy
Reference Point Session Dosage Given: 18 Gy
Session Number: 1

## 2022-04-01 NOTE — Progress Notes (Signed)
[name] rested with [place] for 30 minutes following his SRS treatment.  Patient denies headache, dizziness, nausea, diplopia or ringing in the ears. Denies fatigue. Patient without complaints. Understands to avoid strenuous activity for the next 24 hours and call [phone] with needs. ? ?BP 134/71 (BP Location: Right Arm, Patient Position: Sitting, Cuff Size: Normal)   Pulse 76   Temp 97.8 ?F (36.6 ?C)   Resp 20   SpO2 99%   ? ?Gloriajean Dell. Leonie Green, BSN   ?

## 2022-04-01 NOTE — H&P (Signed)
Neurosurgery H&P ? ?CC: Left sided weakness ? ?HPI: This is a 88 y.o. man with a history of NSCLC with progressive left sided weakness. He endorses weakness in the LUE / LLE as well as clumsiness and difficulty with controlling those limbs. At baseline he's normally fairly ambidextrous with some left sided dominance in some tasks but has been forced to only use his right hand as well as wheelchair bound due to LLE weakness / ataxia. Being evaluated today prior to [facility].  ? ? ?ROS: A 14 point ROS was performed and is negative except as noted in the HPI.  ? ?PMHx:  ?Past Medical History:  ?Diagnosis Date  ? Anemia   ? Anemia 09/27/2021  ? Cancer [facility])   ? CHF (congestive heart failure) ([place])   ? Chronic kidney disease   ? COPD (chronic obstructive pulmonary disease) ([place])   ? Diverticulosis   ? GERD (gastroesophageal reflux disease)   ? Hypertension   ? Metastasis to brain [facility]) 03/20/2022  ? ?FamHx:  ?Family History  ?Problem Relation Age of Onset  ? Heart attack Mother   ? Hypertension Mother   ? Hypertension Father   ? Hypertension Sister   ? Hypertension Sister   ? Hypertension Sister   ? Hypertension Sister   ? ?SocHx:  reports that he quit smoking about 15 months ago. His smoking use included cigarettes. He smoked an average of .25 packs per day. He has never used smokeless tobacco. He reports that he does not currently use alcohol. He reports that he does not use drugs. ? ?Exam: ?Vital signs in last 24 hours: ?Temp:  [97.8 ?F (36.6 ?C)] 97.8 ?F (36.6 ?C) (05/01 1524) ?Pulse Rate:  [76] 76 (05/01 1524) ?Resp:  [20] 20 (05/01 1524) ?BP: (134)/(71) 134/71 (05/01 1524) ?SpO2:  [99 %] 99 % (05/01 1524) ?General: Awake, alert, cooperative, sitting in wheelchair in NAD ?Head: Normocephalic and atruamatic ?HEENT: Neck supple ?Pulmonary: breathing room air comfortably, no evidence of increased work of breathing ?Cardiac: RRR ?Abdomen: S NT ND ?Extremities: Warm and well perfused x4 ?Neuro: AOx3, PERRL, EOMI, FS ?Strength  5/5 except LUE weakness 4- to 4/5, LLE weakness 4-/5 and 3/5 in some groups ? ?Assessment and Plan: 88 y.o. man with NSCLC and progressive left sided weakness. MRI brain personally reviewed, it shows a right thalamic metastasis that extends into the proximal midbrain.  ? ?-Discussed with the patient, I think this is a fairly straightforward decision regarding SRS being the best modality for treatment of this lesions. We discussed r/b/a and he'd like to proceed.  ? ?Judith Part, MD ?04/01/22 ?3:41 PM ?[facility] ? ?

## 2022-04-01 NOTE — Op Note (Signed)
?  Name: Robert Hamilton  MRN: [id number]  ?Date: 04/01/2022   DOB: 04/18/1934 ? ?Stereotactic Radiosurgery Operative Note ? ?PRE-OPERATIVE DIAGNOSIS:  Multiple Brain Metastases ? ?POST-OPERATIVE DIAGNOSIS:  Multiple Brain Metastases ? ?PROCEDURE:  Stereotactic Radiosurgery ? ?SURGEON:  Judith Part, MD ? ?NARRATIVE: The patient underwent a radiation treatment planning session in the radiation oncology simulation suite under the care of the radiation oncology physician and physicist.  I participated closely in the radiation treatment planning afterwards. The patient underwent planning CT which was fused to 3T high resolution MRI with 1 mm axial slices.  These images were fused on the planning system.  We contoured the gross target volumes and subsequently expanded this to yield the Planning Target Volume. I actively participated in the planning process.  I helped to define and review the target contours and also the contours of the optic pathway, eyes, brainstem and selected nearby organs at risk.  All the dose constraints for critical structures were reviewed and compared to AAPM Task Group 101.  The prescription dose conformity was reviewed.  I approved the plan electronically.   ? ?Accordingly, Robert Hamilton was brought to the TrueBeam stereotactic radiation treatment linac and placed in the custom immobilization mask.  The patient was aligned according to the IR fiducial markers with BrainLab Exactrac, then orthogonal x-rays were used in ExacTrac with the 6DOF robotic table and the shifts were made to align the patient ? ?Robert Hamilton received stereotactic radiosurgery uneventfully.   ? ?Lesions treated:  1  ? ?Complex lesions treated:  1 (>3.5 cm, <76mm of optic path, or within the brainstem)  - lesion and treatment field extend into the right midbrain ? ?The detailed description of the procedure is recorded in the radiation oncology procedure note.  I was present for the duration of the  procedure. ? ?DISPOSITION:  Following delivery, the patient was transported to nursing in stable condition and monitored for possible acute effects to be discharged to home in stable condition with follow-up in one month. ? ?Judith Part, MD ?04/01/2022 3:45 PM ? ?

## 2022-04-02 NOTE — Progress Notes (Signed)
?  Radiation Oncology         [phone] ?________________________________ ? ?Stereotactic Treatment Procedure Note ? ?Name: Robert Hamilton MRN: [id number]  ?Date: 04/01/2022  DOB: [birth year] ? ?SPECIAL TREATMENT PROCEDURE ? ?  ICD-10-CM   ?1. Prostate cancer ([place])  C61   ?  ? ? ?3D TREATMENT PLANNING AND DOSIMETRY:  The patient's radiation plan was reviewed and approved by neurosurgery and radiation oncology prior to treatment.  It showed 3-dimensional radiation distributions overlaid onto the planning CT/MRI image set.  The [facility] for the target structures as well as the organs at risk were reviewed. The documentation of the 3D plan and dosimetry are filed in the radiation oncology EMR. ? ?NARRATIVE:  Robert Hamilton was brought to the TrueBeam stereotactic radiation treatment machine and placed supine on the CT couch. The head frame was applied, and the patient was set up for stereotactic radiosurgery.  Neurosurgery was present for the set-up and delivery ? ?SIMULATION VERIFICATION:  In the couch zero-angle position, the patient underwent Exactrac imaging using the Brainlab system with orthogonal KV images.  These were carefully aligned and repeated to confirm treatment position for each of the isocenters.  The Exactrac snap film verification was repeated at each couch angle. ? ?PROCEDURE: Robert Hamilton received stereotactic radiosurgery to the following targets: ?Right Thalamic 17 mm target was treated using 5 Rapid Arc VMAT Beams to a prescription dose of 18 Gy.  ExacTrac registration was performed for each couch angle.  The 100% isodose line was prescribed.  6 MV X-rays were delivered in the flattening filter free beam mode. ? ?STEREOTACTIC TREATMENT MANAGEMENT:  Following delivery, the patient was transported to nursing in stable condition and monitored for possible acute effects.  Vital signs were recorded BP 134/71 (BP Location: Right Arm, Patient Position: Sitting, Cuff Size: Normal)   Pulse 76    Temp 97.8 ?F (36.6 ?C)   Resp 20   SpO2 99% . The patient tolerated treatment without significant acute effects, and was discharged to home in stable condition.   ? ?PLAN: Follow-up in one month. ? ?________________________________ ? ?Sheral Apley Tammi Klippel, M.D. ? ? ?

## 2022-04-19 DIAGNOSIS — N3944 Nocturnal enuresis: Secondary | ICD-10-CM | POA: Diagnosis not present

## 2022-04-19 DIAGNOSIS — Z1331 Encounter for screening for depression: Secondary | ICD-10-CM | POA: Diagnosis not present

## 2022-04-19 DIAGNOSIS — Z6821 Body mass index (BMI) 21.0-21.9, adult: Secondary | ICD-10-CM | POA: Diagnosis not present

## 2022-04-24 ENCOUNTER — Observation Stay (HOSPITAL_COMMUNITY)
Admission: EM | Admit: 2022-04-24 | Discharge: 2022-04-25 | Disposition: A | Discharge status: Home / Self Care | Payer: Medicare Other | Attending: Internal Medicine | Admitting: Internal Medicine

## 2022-04-24 ENCOUNTER — Encounter (HOSPITAL_COMMUNITY): Payer: Self-pay | Admitting: Emergency Medicine

## 2022-04-24 DIAGNOSIS — Z66 Do not resuscitate: Secondary | ICD-10-CM | POA: Diagnosis present

## 2022-04-24 DIAGNOSIS — R58 Hemorrhage, not elsewhere classified: Secondary | ICD-10-CM | POA: Diagnosis not present

## 2022-04-24 DIAGNOSIS — I5022 Chronic systolic (congestive) heart failure: Secondary | ICD-10-CM | POA: Diagnosis not present

## 2022-04-24 DIAGNOSIS — J449 Chronic obstructive pulmonary disease, unspecified: Secondary | ICD-10-CM | POA: Diagnosis not present

## 2022-04-24 DIAGNOSIS — K648 Other hemorrhoids: Secondary | ICD-10-CM | POA: Diagnosis not present

## 2022-04-24 DIAGNOSIS — C7800 Secondary malignant neoplasm of unspecified lung: Secondary | ICD-10-CM | POA: Insufficient documentation

## 2022-04-24 DIAGNOSIS — Z87891 Personal history of nicotine dependence: Secondary | ICD-10-CM | POA: Diagnosis not present

## 2022-04-24 DIAGNOSIS — I10 Essential (primary) hypertension: Secondary | ICD-10-CM | POA: Diagnosis present

## 2022-04-24 DIAGNOSIS — K921 Melena: Secondary | ICD-10-CM

## 2022-04-24 DIAGNOSIS — I130 Hypertensive heart and chronic kidney disease with heart failure and stage 1 through stage 4 chronic kidney disease, or unspecified chronic kidney disease: Secondary | ICD-10-CM | POA: Insufficient documentation

## 2022-04-24 DIAGNOSIS — K625 Hemorrhage of anus and rectum: Secondary | ICD-10-CM | POA: Diagnosis not present

## 2022-04-24 DIAGNOSIS — K922 Gastrointestinal hemorrhage, unspecified: Secondary | ICD-10-CM | POA: Diagnosis not present

## 2022-04-24 DIAGNOSIS — Z8546 Personal history of malignant neoplasm of prostate: Secondary | ICD-10-CM | POA: Diagnosis not present

## 2022-04-24 DIAGNOSIS — C3492 Malignant neoplasm of unspecified part of left bronchus or lung: Secondary | ICD-10-CM | POA: Diagnosis present

## 2022-04-24 DIAGNOSIS — Z79899 Other long term (current) drug therapy: Secondary | ICD-10-CM | POA: Diagnosis not present

## 2022-04-24 DIAGNOSIS — N1831 Chronic kidney disease, stage 3a: Secondary | ICD-10-CM | POA: Diagnosis present

## 2022-04-24 DIAGNOSIS — C7931 Secondary malignant neoplasm of brain: Secondary | ICD-10-CM | POA: Diagnosis not present

## 2022-04-24 DIAGNOSIS — I5042 Chronic combined systolic (congestive) and diastolic (congestive) heart failure: Secondary | ICD-10-CM | POA: Diagnosis present

## 2022-04-24 LAB — CBC
HCT: 38.8 % — ABNORMAL LOW (ref 39.0–52.0)
HCT: 32 % — ABNORMAL LOW (ref 39.0–52.0)
HCT: 29.1 % — ABNORMAL LOW (ref 39.0–52.0)
Hemoglobin: 11.5 g/dL — ABNORMAL LOW (ref 13.0–17.0)
Hemoglobin: 9.8 g/dL — ABNORMAL LOW (ref 13.0–17.0)
Hemoglobin: 9.1 g/dL — ABNORMAL LOW (ref 13.0–17.0)
MCH: 26.6 pg (ref 26.0–34.0)
MCH: 27.2 pg (ref 26.0–34.0)
MCH: 26.9 pg (ref 26.0–34.0)
MCHC: 29.6 g/dL — ABNORMAL LOW (ref 30.0–36.0)
MCHC: 30.6 g/dL (ref 30.0–36.0)
MCHC: 31.3 g/dL (ref 30.0–36.0)
MCV: 89.6 fL (ref 80.0–100.0)
MCV: 88.9 fL (ref 80.0–100.0)
MCV: 86.1 fL (ref 80.0–100.0)
Platelets: 152 10*3/uL (ref 150–400)
Platelets: 150 10*3/uL (ref 150–400)
Platelets: 148 10*3/uL — ABNORMAL LOW (ref 150–400)
RBC: 4.33 MIL/uL (ref 4.22–5.81)
RBC: 3.6 MIL/uL — ABNORMAL LOW (ref 4.22–5.81)
RBC: 3.38 MIL/uL — ABNORMAL LOW (ref 4.22–5.81)
RDW: 15.9 % — ABNORMAL HIGH (ref 11.5–15.5)
RDW: 16 % — ABNORMAL HIGH (ref 11.5–15.5)
RDW: 16.1 % — ABNORMAL HIGH (ref 11.5–15.5)
WBC: 8.2 10*3/uL (ref 4.0–10.5)
WBC: 7.9 10*3/uL (ref 4.0–10.5)
WBC: 6.7 10*3/uL (ref 4.0–10.5)
nRBC: 0.2 % (ref 0.0–0.2)
nRBC: 0.4 % — ABNORMAL HIGH (ref 0.0–0.2)
nRBC: 0.3 % — ABNORMAL HIGH (ref 0.0–0.2)

## 2022-04-24 LAB — COMPREHENSIVE METABOLIC PANEL
ALT: 19 U/L (ref 0–44)
AST: 15 U/L (ref 15–41)
Albumin: 2.6 g/dL — ABNORMAL LOW (ref 3.5–5.0)
Alkaline Phosphatase: 67 U/L (ref 38–126)
Anion gap: 8 (ref 5–15)
BUN: 26 mg/dL — ABNORMAL HIGH (ref 8–23)
CO2: 22 mmol/L (ref 22–32)
Calcium: 8.2 mg/dL — ABNORMAL LOW (ref 8.9–10.3)
Chloride: 114 mmol/L — ABNORMAL HIGH (ref 98–111)
Creatinine, Ser: 1.24 mg/dL (ref 0.61–1.24)
GFR, Estimated: 57 mL/min — ABNORMAL LOW (ref >60)
Glucose, Bld: 131 mg/dL — ABNORMAL HIGH (ref 70–99)
Potassium: 3.5 mmol/L (ref 3.5–5.1)
Sodium: 144 mmol/L (ref 135–145)
Total Bilirubin: 0.8 mg/dL (ref 0.3–1.2)
Total Protein: 6.1 g/dL — ABNORMAL LOW (ref 6.5–8.1)

## 2022-04-24 LAB — TYPE AND SCREEN
ABO/RH(D): B POS
Antibody Screen: NEGATIVE

## 2022-04-24 LAB — POC OCCULT BLOOD, ED: Fecal Occult Bld: POSITIVE — AB

## 2022-04-24 MED ORDER — SODIUM CHLORIDE 0.9% FLUSH
3.0000 mL | Freq: Two times a day (BID) | INTRAVENOUS | Status: DC
  Administered 2022-04-24 – 2022-04-25 (×2): 3 mL via INTRAVENOUS

## 2022-04-24 MED ORDER — ONDANSETRON HCL 4 MG/2ML IJ SOLN: 4.0000 mg | Freq: Four times a day (QID) | INTRAMUSCULAR | Status: DC | PRN

## 2022-04-24 MED ORDER — PANTOPRAZOLE SODIUM 40 MG PO TBEC
40.0000 mg | DELAYED_RELEASE_TABLET | Freq: Every day | ORAL | Status: DC
  Administered 2022-04-25: 40 mg via ORAL
  Filled 2022-04-24: qty 1

## 2022-04-24 MED ORDER — SACUBITRIL-VALSARTAN 24-26 MG PO TABS
1.0000 | ORAL_TABLET | Freq: Two times a day (BID) | ORAL | Status: DC
  Administered 2022-04-25: 1 via ORAL
  Filled 2022-04-24 (×3): qty 1

## 2022-04-24 MED ORDER — HYDRALAZINE HCL 20 MG/ML IJ SOLN: 5.0000 mg | INTRAMUSCULAR | Status: DC | PRN

## 2022-04-24 MED ORDER — LACTATED RINGERS IV SOLN: INTRAVENOUS | Status: DC

## 2022-04-24 MED ORDER — ACETAMINOPHEN 325 MG PO TABS
650.0000 mg | ORAL_TABLET | Freq: Four times a day (QID) | ORAL | Status: DC | PRN
  Administered 2022-04-25: 650 mg via ORAL
  Filled 2022-04-24: qty 2

## 2022-04-24 MED ORDER — ALLOPURINOL 100 MG PO TABS
100.0000 mg | ORAL_TABLET | Freq: Every day | ORAL | Status: DC
  Administered 2022-04-25: 100 mg via ORAL
  Filled 2022-04-24: qty 1

## 2022-04-24 MED ORDER — OXYCODONE HCL 5 MG PO TABS: 10.0000 mg | ORAL_TABLET | ORAL | Status: DC | PRN

## 2022-04-24 MED ORDER — ACETAMINOPHEN 650 MG RE SUPP: 650.0000 mg | Freq: Four times a day (QID) | RECTAL | Status: DC | PRN

## 2022-04-24 MED ORDER — FOLIC ACID 1 MG PO TABS
1.0000 mg | ORAL_TABLET | Freq: Every day | ORAL | Status: DC
  Administered 2022-04-25: 1 mg via ORAL
  Filled 2022-04-24: qty 1

## 2022-04-24 MED ORDER — HYDROCORTISONE ACETATE 25 MG RE SUPP
25.0000 mg | Freq: Every day | RECTAL | Status: DC
  Filled 2022-04-24: qty 1

## 2022-04-24 MED ORDER — ONDANSETRON HCL 4 MG PO TABS: 4.0000 mg | ORAL_TABLET | Freq: Four times a day (QID) | ORAL | Status: DC | PRN

## 2022-04-24 MED ORDER — MORPHINE SULFATE (PF) 2 MG/ML IV SOLN: 2.0000 mg | INTRAVENOUS | Status: DC | PRN

## 2022-04-24 MED ORDER — ALBUTEROL SULFATE (2.5 MG/3ML) 0.083% IN NEBU: 5.0000 mg | INHALATION_SOLUTION | Freq: Four times a day (QID) | RESPIRATORY_TRACT | Status: DC | PRN

## 2022-04-24 MED ORDER — ALBUTEROL SULFATE HFA 108 (90 BASE) MCG/ACT IN AERS: 2.0000 | INHALATION_SPRAY | Freq: Four times a day (QID) | RESPIRATORY_TRACT | Status: DC | PRN

## 2022-04-24 MED ORDER — METOPROLOL SUCCINATE ER 25 MG PO TB24
25.0000 mg | ORAL_TABLET | Freq: Every day | ORAL | Status: DC
  Administered 2022-04-25: 25 mg via ORAL
  Filled 2022-04-24: qty 1

## 2022-04-24 MED ORDER — VITAMIN B-1 100 MG PO TABS
100.0000 mg | ORAL_TABLET | Freq: Every day | ORAL | Status: DC
  Administered 2022-04-25: 100 mg via ORAL
  Filled 2022-04-24 (×2): qty 1

## 2022-04-24 NOTE — Progress Notes (Signed)
Patient arrived to [place] room 20 alert and oriented x4. Pain level 0/10. Assisted patient with ambulation to bathroom using walker. Still having a lot of blood in stool.. Bed in lowest position. Call light in reach. Will continue to monitor pt.

## 2022-04-24 NOTE — Consult Note (Addendum)
Consultation Note   Referring Provider: [facility] PCP: Maryella Shivers, MD Primary Gastroenterologist: Jackquline Denmark, MD Reason for consultation: GI bleed  Hospital Day: 1   Attending physician's note  I have taken a history, reviewed the chart and examined the patient. I performed a substantive portion of this encounter, including complete performance of at least one of the key components, in conjunction with the APP. I agree with the APP's note, impression and recommendations.    88 year old very pleasant gentleman with multiple medical problems, recent diagnosis of metastatic lung cancer currently undergoing radiation therapy and chemotherapy presented with small-volume bright red blood per rectum, likely etiology small-volume hemorrhage from internal hemorrhoids.  No significant drop in hemoglobin He is hemodynamically stable  Use Anusol suppository per rectum daily at bedtime for 5 to 7 days Continue diet as tolerated We will hold off diagnostic exam unless it is indicated for therapeutic intervention if he continues to have significant bleeding  Patient wants to go home, ok to discharge from GI standpoint   The patient was provided an opportunity to ask questions and all were answered. The patient agreed with the plan and demonstrated an understanding of the instructions.  Damaris Hippo , MD [phone]    Assessment   88 yo male with multiple medical problems presenting with painless rectal bleeding. Started this am. Hemodynamically stable and no decline in hgb at this point. Possibly a diverticular hemorrhage though prior imaging hasn't shown diverticular disease.  Hemorrhoidal bleeding is possible. Doubt ischemic colitis in absence of pain, normal WBC. Doubt brisk upper GI bleed. Colon neoplasm not excluded.   Metastatic lung cancer undergoing chemotherapy and radiation to brain.   [place] anemia, chronic. Probably due  in part to chemotherapy  GERD, on PPI at home  See PMH for additional medical problems   Plan   History of Present Illness:  Robert Hamilton is a 88 y.o. male with a past medical history significant for GERD, CKD, CHF, HTN, metastatic squamous cell lung cancer undergoing chemotherapy and brain radiation.  See PMH for any additional medical problems.  Patient presented to ED from [facility] for rectal bleeding.  This morning patient felt the urge to have a bowel movement but passed some bright red blood instead.  When he stood up the blood was leaking from his rectum as he was walking.  He had no associated abdominal pain or rectal pain.  No nausea nor vomiting.  He is not anticoagulated.  Denies NSAID use.  Prior to the onset of bleeding patient did not have constipation or diarrhea.  In fact he says his bowel movements are typically normal.  He reports that his last colonoscopy was maybe 9 years ago.  No family history of colon cancer  In the ED he is afebrile, normotensive.  Heart rate normal.  His hemoglobin is 11.5 which is at baseline.  MCV 89.  Platelets are 152.  BUN is elevated at 26 with creatinine of 1.24.  His BUN has been elevated in the past as well.  Albumin 2.6, liver chemistries otherwise unremarkable.  On DRE there is a very scant amount of red stool in vault.   Previous GI Evaluation / History     Labs:  Recent  Labs    04/24/22 0906  WBC 8.2  HGB 11.5*  HCT 38.8*  PLT 152   Recent Labs    04/24/22 0906  NA 144  K 3.5  CL 114*  CO2 22  GLUCOSE 131*  BUN 26*  CREATININE 1.24  CALCIUM 8.2*   Recent Labs    04/24/22 0906  PROT 6.1*  ALBUMIN 2.6*  AST 15  ALT 19  ALKPHOS 67  BILITOT 0.8   No results for input(s): HEPBSAG, HCVAB, HEPAIGM, HEPBIGM in the last 72 hours. No results for input(s): LABPROT, INR in the last 72 hours.  Past Medical History:  Diagnosis Date   Anemia    Anemia 09/27/2021   Cancer (HCC)    CHF (congestive heart failure)  (HCC)    Chronic kidney disease    COPD (chronic obstructive pulmonary disease) (HCC)    Diverticulosis    GERD (gastroesophageal reflux disease)    Hypertension    Metastasis to brain ([place]) 03/20/2022    Past Surgical History:  Procedure Laterality Date   CRYOTHERAPY  06/26/2021   Procedure: CRYOTHERAPY;  Surgeon: Garner Nash, DO;  Location: [facility];  Service: Pulmonary;;   HEMOSTASIS CONTROL  06/26/2021   Procedure: HEMOSTASIS CONTROL;  Surgeon: Garner Nash, DO;  Location: [place];  Service: Pulmonary;;   HERNIA REPAIR     PROSTATE SURGERY     PROSTATECTOMY     VIDEO BRONCHOSCOPY Left 06/26/2021   Procedure: VIDEO BRONCHOSCOPY WITHOUT FLUORO;  Surgeon: Garner Nash, DO;  Location: [facility];  Service: Pulmonary;  Laterality: Left;  possible cryotherapy    Family History  Problem Relation Age of Onset   Heart attack Mother    Hypertension Mother    Hypertension Father    Hypertension Sister    Hypertension Sister    Hypertension Sister    Hypertension Sister     Prior to Admission medications   Medication Sig Start Date End Date Taking? Authorizing Provider  acetaminophen (TYLENOL) 325 MG tablet Take 2 tablets (650 mg total) by mouth every 6 (six) hours as needed for mild pain or fever (or Fever >/= 101). 06/28/21   Barb Merino, MD  albuterol (VENTOLIN HFA) 108 (90 Base) MCG/ACT inhaler 2 puffs every 6 (six) hours as needed. Shortness of breath 06/05/21   [provider]  allopurinol (ZYLOPRIM) 100 MG tablet Take 100 mg by mouth daily. 04/26/21   [provider]  dexamethasone (DECADRON) 4 MG tablet One tab po TID 01/18/22   Lavera Guise A, MD  ENTRESTO 24-26 MG TAKE 1 TABLET BY MOUTH TWICE DAILY 01/31/22   Patwardhan, Reynold Bowen, MD  folic acid (FOLVITE) 1 MG tablet Take 1 mg by mouth daily. 02/23/19   [provider]  metoprolol succinate (TOPROL-XL) 25 MG 24 hr tablet TAKE 1/2 TABLET(12.5 MG) BY MOUTH DAILY 03/18/22    Patwardhan, Manish J, MD  ondansetron (ZOFRAN) 4 MG tablet Take 1 tablet (4 mg total) by mouth every 4 (four) hours as needed for nausea. 09/17/21   Dayton Scrape A, NP  Oxycodone HCl 10 MG TABS Take 1 tablet (10 mg total) by mouth every 4 (four) hours as needed. 12/18/21   Dayton Scrape A, NP  pantoprazole (PROTONIX) 40 MG tablet Take 40 mg by mouth daily. 07/16/19   [provider]  prochlorperazine (COMPAZINE) 10 MG tablet Take 1 tablet (10 mg total) by mouth every 6 (six) hours as needed for nausea or vomiting. 09/17/21   Dayton Scrape  A, NP  thiamine (VITAMIN B-1) 100 MG tablet Take 100 mg by mouth daily.    [provider]  zolpidem (AMBIEN) 10 MG tablet Take 1 tablet (10 mg total) by mouth at bedtime as needed for sleep. 02/28/22 03/30/22  [name], MD    No current facility-administered medications for this encounter.   Current Outpatient Medications  Medication Sig Dispense Refill   acetaminophen (TYLENOL) 325 MG tablet Take 2 tablets (650 mg total) by mouth every 6 (six) hours as needed for mild pain or fever (or Fever >/= 101).     albuterol (VENTOLIN HFA) 108 (90 Base) MCG/ACT inhaler 2 puffs every 6 (six) hours as needed. Shortness of breath     allopurinol (ZYLOPRIM) 100 MG tablet Take 100 mg by mouth daily.     dexamethasone (DECADRON) 4 MG tablet One tab po TID 90 tablet 1   ENTRESTO 24-26 MG TAKE 1 TABLET BY MOUTH TWICE DAILY 60 tablet 1   folic acid (FOLVITE) 1 MG tablet Take 1 mg by mouth daily.     metoprolol succinate (TOPROL-XL) 25 MG 24 hr tablet TAKE 1/2 TABLET(12.5 MG) BY MOUTH DAILY 30 tablet 1   ondansetron (ZOFRAN) 4 MG tablet Take 1 tablet (4 mg total) by mouth every 4 (four) hours as needed for nausea. 90 tablet 3   Oxycodone HCl 10 MG TABS Take 1 tablet (10 mg total) by mouth every 4 (four) hours as needed. 120 tablet 0   pantoprazole (PROTONIX) 40 MG tablet Take 40 mg by mouth daily.     prochlorperazine (COMPAZINE) 10 MG tablet  Take 1 tablet (10 mg total) by mouth every 6 (six) hours as needed for nausea or vomiting. 90 tablet 3   thiamine (VITAMIN B-1) 100 MG tablet Take 100 mg by mouth daily.     zolpidem (AMBIEN) 10 MG tablet Take 1 tablet (10 mg total) by mouth at bedtime as needed for sleep. 30 tablet 0    Allergies as of 04/24/2022 - Review Complete 04/24/2022  Allergen Reaction Noted   Paclitaxel Shortness Of Breath 09/18/2021    Social History   Socioeconomic History   Marital status: Divorced    Spouse name: Not on file   Number of children: 2   Years of education: Not on file   Highest education level: Not on file  Occupational History   Not on file  Tobacco Use   Smoking status: Former    Packs/day: 0.25    Types: Cigarettes    Quit date: 12/2020    Years since quitting: 1.3   Smokeless tobacco: Never   Tobacco comments:    1-2 per day  Vaping Use   Vaping Use: Never used  Substance and Sexual Activity   Alcohol use: Not Currently    Comment: stopped 12/2018   Drug use: No   Sexual activity: Yes  Other Topics Concern   Not on file  Social History Narrative   Not on file   Social Determinants of Health   Financial Resource Strain: Not on file  Food Insecurity: Not on file  Transportation Needs: Not on file  Physical Activity: Not on file  Stress: Not on file  Social Connections: Not on file  Intimate Partner Violence: Not on file    Review of Systems: All systems reviewed and negative except where noted in HPI.  Physical Exam: Vital signs in last 24 hours: Temp:  [97.4 F (36.3 C)] 97.4 F (36.3 C) (05/24 0858) Pulse Rate:  [  75-88] 88 (05/24 1030) Resp:  [16-18] 18 (05/24 1030) BP: (118-120)/(68-98) 120/98 (05/24 1030) SpO2:  [98 %-100 %] 100 % (05/24 1030)    General:  Alert male in NAD Psych:  Pleasant, cooperative. Normal mood and affect Eyes: Pupils equal, no icterus. Conjunctive pink Ears:  Normal auditory acuity Nose: No deformity, discharge or  lesions Neck:  Supple, no masses felt Lungs:  Clear to auscultation.  Heart:  Regular rate, regular rhythm. No lower extremity edema Abdomen:  Soft, nondistended, nontender, active bowel sounds, no masses felt Rectal :  Scant amount of red stool in vault. He had no pain with DRE Msk: Symmetrical without gross deformities.  Neurologic:  Alert, oriented, grossly normal neurologically Skin:  Intact without significant lesions.    Intake/Output from previous day: No intake/output data recorded. Intake/Output this shift:  No intake/output data recorded.    Principal Problem:   Acute lower GI bleeding    Tye Savoy, NP-C @  04/24/2022, 12:07 PM

## 2022-04-24 NOTE — ED Triage Notes (Signed)
Patient BIB [facility] for new bright red rectal bleeding. Patient states when he stood up earlier today blood leaked from his rectum, denies being on blood thinners. Patient is alert and oriented and in no apparent distress. Denies pain.

## 2022-04-24 NOTE — H&P (Signed)
History and Physical    Patient: Robert Hamilton DEY:[id number] DOB: 1934/09/23 DOA: 04/24/2022 DOS: the patient was seen and examined on 04/24/2022 PCP: Maryella Shivers, MD  Patient coming from: Home - lives alone; NOK: Daughter, Forde Radon, [phone]   Chief Complaint: Rectal bleeding  HPI: Robert Hamilton is a 88 y.o. male with medical history significant of COPD; CKD; CHF; HTN; and prostate CA with brain mets undergoing active radiation therapy presenting with BRBPR.  He reports that he was sleeping and woke up to go to the bathroom. He looked behind him and there was nothing but blood.  He has some urinary incontinence and he thought it was related to that until he saw the blood.  He had a prior bleed a few years ago.  No pain.    He is taking treatments and it got worse, "changed me around, I'm not the same person I once was."  He doesn't feel like doing anything.  He nails are falling off.  He hasn't been since February to med oncology.  He is not sure if he has finished radiation treatments to his brain.    ER Course:  Rectal bleeding.  "Rush of bright red blood."  Hgb stable.  Last GI bleed 2020.  GI to consult.     Review of Systems: As mentioned in the history of present illness. All other systems reviewed and are negative. Past Medical History:  Diagnosis Date   Anemia    Anemia 09/27/2021   Cancer (HCC)    CHF (congestive heart failure) (HCC)    Chronic kidney disease    COPD (chronic obstructive pulmonary disease) (HCC)    Diverticulosis    GERD (gastroesophageal reflux disease)    Hypertension    Metastasis to brain ([place]) 03/20/2022   Past Surgical History:  Procedure Laterality Date   CRYOTHERAPY  06/26/2021   Procedure: CRYOTHERAPY;  Surgeon: Garner Nash, DO;  Location: [facility];  Service: Pulmonary;;   HEMOSTASIS CONTROL  06/26/2021   Procedure: HEMOSTASIS CONTROL;  Surgeon: Garner Nash, DO;  Location: [facility];  Service:  Pulmonary;;   HERNIA REPAIR     PROSTATE SURGERY     PROSTATECTOMY     VIDEO BRONCHOSCOPY Left 06/26/2021   Procedure: VIDEO BRONCHOSCOPY WITHOUT FLUORO;  Surgeon: Garner Nash, DO;  Location: [facility];  Service: Pulmonary;  Laterality: Left;  possible cryotherapy   Social History:  reports that he quit smoking about 16 months ago. His smoking use included cigarettes. He smoked an average of .25 packs per day. He has never used smokeless tobacco. He reports that he does not currently use alcohol. He reports that he does not use drugs.  Allergies  Allergen Reactions   Paclitaxel Shortness Of Breath    Family History  Problem Relation Age of Onset   Heart attack Mother    Hypertension Mother    Hypertension Father    Hypertension Sister    Hypertension Sister    Hypertension Sister    Hypertension Sister     Prior to Admission medications   Medication Sig Start Date End Date Taking? Authorizing Provider  acetaminophen (TYLENOL) 325 MG tablet Take 2 tablets (650 mg total) by mouth every 6 (six) hours as needed for mild pain or fever (or Fever >/= 101). 06/28/21   Barb Merino, MD  albuterol (VENTOLIN HFA) 108 (90 Base) MCG/ACT inhaler 2 puffs every 6 (six) hours as needed. Shortness of breath 06/05/21   [provider]  allopurinol (ZYLOPRIM) 100 MG tablet Take 100 mg by mouth daily. 04/26/21   [provider]  dexamethasone (DECADRON) 4 MG tablet One tab po TID 01/18/22   Lavera Guise A, MD  ENTRESTO 24-26 MG TAKE 1 TABLET BY MOUTH TWICE DAILY 01/31/22   Patwardhan, Reynold Bowen, MD  folic acid (FOLVITE) 1 MG tablet Take 1 mg by mouth daily. 02/23/19   [provider]  metoprolol succinate (TOPROL-XL) 25 MG 24 hr tablet TAKE 1/2 TABLET(12.5 MG) BY MOUTH DAILY 03/18/22   Patwardhan, Manish J, MD  ondansetron (ZOFRAN) 4 MG tablet Take 1 tablet (4 mg total) by mouth every 4 (four) hours as needed for nausea. 09/17/21   Dayton Scrape A, NP  Oxycodone HCl 10  MG TABS Take 1 tablet (10 mg total) by mouth every 4 (four) hours as needed. 12/18/21   Dayton Scrape A, NP  pantoprazole (PROTONIX) 40 MG tablet Take 40 mg by mouth daily. 07/16/19   [provider]  prochlorperazine (COMPAZINE) 10 MG tablet Take 1 tablet (10 mg total) by mouth every 6 (six) hours as needed for nausea or vomiting. 09/17/21   Dayton Scrape A, NP  thiamine (VITAMIN B-1) 100 MG tablet Take 100 mg by mouth daily.    [provider]  zolpidem (AMBIEN) 10 MG tablet Take 1 tablet (10 mg total) by mouth at bedtime as needed for sleep. 02/28/22 03/30/22  [name], MD    Physical Exam: Vitals:   04/24/22 1430 04/24/22 1445 04/24/22 1500 04/24/22 1610  BP: 103/67 100/70 (!) 113/59 135/78  Pulse: 74 74 71 74  Resp: (!) 21 17 16 18   Temp:      TempSrc:      SpO2: 100% 100% 100% 100%  Weight:      Height:       General:  Appears calm and comfortable and is in NAD Eyes:  EOMI, normal lids, iris ENT:  grossly normal hearing, lips & tongue, mmm; some absent dentition Neck:  no LAD, masses or thyromegaly Cardiovascular:  RRR, no m/r/g. No LE edema.  Respiratory:   CTA bilaterally with no wheezes/rales/rhonchi.  Normal respiratory effort. Abdomen:  soft, NT, ND Skin:  no rash or induration seen on limited exam Musculoskeletal:  grossly normal tone BUE/BLE, good ROM, no bony abnormality Psychiatric:  grossly normal mood and affect, speech fluent and appropriate, AOx3 Neurologic:  CN 2-12 grossly intact, moves all extremities in coordinated fashion   Radiological Exams on Admission: Independently reviewed - see discussion in A/P where applicable  No results found.  EKG: pending   Labs on Admission: I have personally reviewed the available labs and imaging studies at the time of the admission.  Pertinent labs:    Glucose 131 BUN 26/Creatinine 1.24/GFR 57 - stable Albumin 2.6 WBC 8.2 Hgb 11.5 -> 9.8 Heme positive   Assessment and  Plan: Principal Problem:   Acute lower GI bleeding Active Problems:   Essential hypertension   Chronic systolic (congestive) heart failure (HCC)   Squamous cell lung cancer, left (HCC)   Chronic kidney disease, stage 3a (HCC)   DNR (do not resuscitate)   Lower GI bleeding -Differential to include bleeding hemorrhoids; bacterial infectious colitisi; colonic diverticula; and AVM - but most likely etiology is diverticular bleeding -He is afebrile at this time without tachycardia, and no leukocytosis; will not give antibiotics at this time.  -Will admit -Continue to monitor for recurrent bleeding -CTA is the best radiologic test for localization of GI bleeding,  detecting bleeding of 0.3-0.5cc/min -If significant bleeding, a tagged RBC scan could be considered to try to determine the most likely source of the bleeding  -GI consult has been requested -Hgb is dropping but still appropriate currently -Type and screen were done in ED.  -Monitor closely and follow cbc q8h, transfuse as necessary for Hbg <7   Metastatic lung CA -Multiple brain mets, recently underwent stereotactic radiosurgery with Drs. Ostergard and [employer] -Also with hip mets -He reports that his fingernails and toenails all fell off with prior chemi -During his last oncology visit he was offered the option of transitioning to comfort care only and he declined -Would suggest ongoing discussions in this regard  Chronic Systolic CHF -Continue Entresto -Appears to be compensated at this time  Stage 3a CKD -Appears to be stable at this time -Will follow  HTN -Continue Toprol XL  Gout -Continue Allopurinol  DNR -I have discussed code status with the patient and he would not desire resuscitation and would prefer to die a natural death should that situation arise. -He will need a gold out of facility DNR form at the time of discharge    Advance Care Planning:   Code Status: DNR   Consults: GI  DVT Prophylaxis:  SCDs  Family Communication: None present; I spoke with his daughter on the evening of admission  Severity of Illness: The appropriate patient status for this patient is INPATIENT. Inpatient status is judged to be reasonable and necessary in order to provide the required intensity of service to ensure the patient's safety. The patient's presenting symptoms, physical exam findings, and initial radiographic and laboratory data in the context of their chronic comorbidities is felt to place them at high risk for further clinical deterioration. Furthermore, it is not anticipated that the patient will be medically stable for discharge from the hospital within 2 midnights of admission.   * I certify that at the point of admission it is my clinical judgment that the patient will require inpatient hospital care spanning beyond 2 midnights from the point of admission due to high intensity of service, high risk for further deterioration and high frequency of surveillance required.*  Author: Karmen Bongo, MD 04/24/2022 7:59 PM  For on call review [email or web address].

## 2022-04-24 NOTE — Plan of Care (Signed)

## 2022-04-24 NOTE — ED Provider Notes (Signed)
[facility] EMERGENCY DEPARTMENT Provider Note   CSN: [id number] Arrival date & time: 04/24/22  0848     History  Chief Complaint  Patient presents with   Rectal Bleeding    Robert Hamilton is a 88 y.o. male.  Patient with history of metastatic stage IV lung cancer with mets to the brain, CHF, diverticulosis, GI bleed, COPD, and anemia presents today with complaints of rectal bleeding. States that when he woke up this morning he felt like he needed to have a bowel movement. States that when he stood up to do so he felt a rush of blood run down his legs. He states that the blood is bright red in nature. Last bowel movement prior to this was yesterday afternoon and was nonbloody and nonmelanous. Hx of same attributed to diverticulosis, last episode of this was in 2020. He is not on blood thinners. Denies any abdominal pain, nausea, vomiting, or diarrhea.   The history is provided by the patient. No language interpreter was used.  Rectal Bleeding     Home Medications Prior to Admission medications   Medication Sig Start Date End Date Taking? Authorizing Provider  acetaminophen (TYLENOL) 325 MG tablet Take 2 tablets (650 mg total) by mouth every 6 (six) hours as needed for mild pain or fever (or Fever >/= 101). 06/28/21   Barb Merino, MD  albuterol (VENTOLIN HFA) 108 (90 Base) MCG/ACT inhaler 2 puffs every 6 (six) hours as needed. Shortness of breath 06/05/21   [provider]  allopurinol (ZYLOPRIM) 100 MG tablet Take 100 mg by mouth daily. 04/26/21   [provider]  dexamethasone (DECADRON) 4 MG tablet One tab po TID 01/18/22   Lavera Guise A, MD  ENTRESTO 24-26 MG TAKE 1 TABLET BY MOUTH TWICE DAILY 01/31/22   Patwardhan, Reynold Bowen, MD  folic acid (FOLVITE) 1 MG tablet Take 1 mg by mouth daily. 02/23/19   [provider]  metoprolol succinate (TOPROL-XL) 25 MG 24 hr tablet TAKE 1/2 TABLET(12.5 MG) BY MOUTH DAILY 03/18/22   Patwardhan, Manish  J, MD  ondansetron (ZOFRAN) 4 MG tablet Take 1 tablet (4 mg total) by mouth every 4 (four) hours as needed for nausea. 09/17/21   Dayton Scrape A, NP  Oxycodone HCl 10 MG TABS Take 1 tablet (10 mg total) by mouth every 4 (four) hours as needed. 12/18/21   Dayton Scrape A, NP  pantoprazole (PROTONIX) 40 MG tablet Take 40 mg by mouth daily. 07/16/19   [provider]  prochlorperazine (COMPAZINE) 10 MG tablet Take 1 tablet (10 mg total) by mouth every 6 (six) hours as needed for nausea or vomiting. 09/17/21   Dayton Scrape A, NP  thiamine (VITAMIN B-1) 100 MG tablet Take 100 mg by mouth daily.    [provider]  zolpidem (AMBIEN) 10 MG tablet Take 1 tablet (10 mg total) by mouth at bedtime as needed for sleep. 02/28/22 03/30/22  [name], MD      Allergies    Paclitaxel    Review of Systems   Review of Systems  Gastrointestinal:  Positive for blood in stool and hematochezia.  All other systems reviewed and are negative.  Physical Exam Updated Vital Signs BP 118/68 (BP Location: Right Arm)   Pulse 75   Temp (!) 97.4 F (36.3 C) (Oral)   Resp 16   SpO2 98%  Physical Exam Vitals and nursing note reviewed. Exam conducted with a chaperone present.  Constitutional:  General: He is not in acute distress.    Appearance: Normal appearance. He is normal weight. He is not ill-appearing, toxic-appearing or diaphoretic.  HENT:     Head: Normocephalic and atraumatic.  Cardiovascular:     Rate and Rhythm: Normal rate.  Pulmonary:     Effort: Pulmonary effort is normal. No respiratory distress.  Abdominal:     General: Abdomen is flat.     Palpations: Abdomen is soft.     Tenderness: There is no abdominal tenderness.  Genitourinary:    Comments: Bright red blood present in the rectal vault. No visible hemorrhoids, fissures, or other abnormality present   Musculoskeletal:        General: Normal range of motion.     Cervical back: Normal range of motion.   Skin:    General: Skin is warm and dry.  Neurological:     General: No focal deficit present.     Mental Status: He is alert.  Psychiatric:        Mood and Affect: Mood normal.        Behavior: Behavior normal.    ED Results / Procedures / Treatments   Labs (all labs ordered are listed, but only abnormal results are displayed) Labs Reviewed  COMPREHENSIVE METABOLIC PANEL - Abnormal; Notable for the following components:      Result Value   Chloride 114 (*)    Glucose, Bld 131 (*)    BUN 26 (*)    Calcium 8.2 (*)    Total Protein 6.1 (*)    Albumin 2.6 (*)    GFR, Estimated 57 (*)    All other components within normal limits  CBC - Abnormal; Notable for the following components:   Hemoglobin 11.5 (*)    HCT 38.8 (*)    MCHC 29.6 (*)    RDW 15.9 (*)    All other components within normal limits  POC OCCULT BLOOD, ED - Abnormal; Notable for the following components:   Fecal Occult Bld POSITIVE (*)    All other components within normal limits  TYPE AND SCREEN    EKG None  Radiology No results found.  Procedures Procedures    Medications Ordered in ED Medications - No data to display  ED Course/ Medical Decision Making/ A&P                           Medical Decision Making Amount and/or Complexity of Data Reviewed Labs: ordered.   This patient presents to the ED for concern of rectal bleeding, this involves an extensive number of treatment options, and is a complaint that carries with it a high risk of complications and morbidity.   Co morbidities that complicate the patient evaluation  Hx metastatic cancer with bone and brain mets, CKD, GI bleeding, diverticulosis, CHF   Additional history obtained:  Additional history obtained from previous oncology notes and previous ER notes from 2020 GI bleed admission   Lab Tests:  I Ordered, and personally interpreted labs.  The pertinent results include:  Hemoglobin 11.5, Total protein 6.1, albumin 2.6. No  other acute laboratory findings   Consultations Obtained:  I requested consultation with the on call [facility],  and discussed lab and imaging findings as well as pertinent plan - they recommend: admit to medicine with GI following  Test / Admission - Considered:  88 year old patient with several comorbid conditions presents today with complaints of rectal bleeding. He is currently  otherwise asymptomatic, however bright red blood is appreciated in the rectal vault with dried blood on the patients legs. Given he appears to still be actively bleeding with hemoglobin 11.5, feel patient will require admission with concerns for decompensation from his GI bleed. Discussed this with patient who is understanding and amenable with plan.   Discussed patient with hospitalist who agrees to admit.    This is a shared visit with supervising physician Dr. Roslynn Amble who has independently evaluated patient & provided guidance in evaluation/management/disposition, in agreement with care    Final Clinical Impression(s) / ED Diagnoses Final diagnoses:  Hematochezia    Rx / DC Orders ED Discharge Orders     None         Nestor Lewandowsky 04/24/22 1339    Lucrezia Starch, MD 04/25/22 [phone]

## 2022-04-25 DIAGNOSIS — K921 Melena: Secondary | ICD-10-CM | POA: Diagnosis not present

## 2022-04-25 DIAGNOSIS — N1831 Chronic kidney disease, stage 3a: Secondary | ICD-10-CM | POA: Diagnosis not present

## 2022-04-25 DIAGNOSIS — K922 Gastrointestinal hemorrhage, unspecified: Secondary | ICD-10-CM | POA: Diagnosis not present

## 2022-04-25 LAB — CBC
HCT: 29.9 % — ABNORMAL LOW (ref 39.0–52.0)
HCT: 31.3 % — ABNORMAL LOW (ref 39.0–52.0)
Hemoglobin: 9 g/dL — ABNORMAL LOW (ref 13.0–17.0)
Hemoglobin: 9.7 g/dL — ABNORMAL LOW (ref 13.0–17.0)
MCH: 26.5 pg (ref 26.0–34.0)
MCH: 26.9 pg (ref 26.0–34.0)
MCHC: 30.1 g/dL (ref 30.0–36.0)
MCHC: 31 g/dL (ref 30.0–36.0)
MCV: 87.9 fL (ref 80.0–100.0)
MCV: 86.9 fL (ref 80.0–100.0)
Platelets: 142 10*3/uL — ABNORMAL LOW (ref 150–400)
Platelets: 148 10*3/uL — ABNORMAL LOW (ref 150–400)
RBC: 3.4 MIL/uL — ABNORMAL LOW (ref 4.22–5.81)
RBC: 3.6 MIL/uL — ABNORMAL LOW (ref 4.22–5.81)
RDW: 16.2 % — ABNORMAL HIGH (ref 11.5–15.5)
RDW: 16.1 % — ABNORMAL HIGH (ref 11.5–15.5)
WBC: 6.3 10*3/uL (ref 4.0–10.5)
WBC: 7.5 10*3/uL (ref 4.0–10.5)
nRBC: 0 % (ref 0.0–0.2)
nRBC: 0.3 % — ABNORMAL HIGH (ref 0.0–0.2)

## 2022-04-25 MED ORDER — HYDROCORTISONE ACETATE 25 MG RE SUPP: 25.0000 mg | Freq: Every day | RECTAL | 0 refills | Status: AC

## 2022-04-25 NOTE — Consult Note (Signed)
   [facility] Inpatient Consult   04/25/2022  Robert Hamilton January 14, 1934 [id number]  [facility] [facility] [ACO] Patient: Medicare ACO REACH  Primary Care Provider:  Maryella Shivers, MD, at [facility]    Patient reviewed for hospitalization with noted high risk score for unplanned readmission risk and to assess for potential [employer] [facility] service needs for post hospital transition.  Review of patient's medical record reveals patient is currently in observation. Recently, has had [facility] outreach for care management services but declined services.   Plan:  No additional needs noted at this time.    For questions contact:   Natividad Brood, RN BSN [facility] Liaison  [phone] business mobile phone Toll free office [phone]  Fax number: [phone] [email or web address]

## 2022-04-25 NOTE — Discharge Summary (Signed)
Physician Discharge Summary  Robert Hamilton FIE:[id number] DOB: 09-26-1934 DOA: 04/24/2022  PCP: Maryella Shivers, MD  Admit date: 04/24/2022 Discharge date: 04/25/2022  Admitted From: Home Disposition: Home  Recommendations for Outpatient Follow-up:  Follow up with PCP in 1-2 weeks Continue Anusol suppositories for 7 days for treatment of hemorrhoids per gastroenterology Please obtain CBC in one week to ensure hemoglobin remained stable Needs further goals of care discussions as per his last oncology visit was offered option of transition to comfort care only.  Home Health: No Equipment/Devices: None  Discharge Condition: Stable CODE STATUS: DNR Diet recommendation: Heart healthy diet  History of present illness:  Robert Hamilton is an 88 year old male with past medical history significant for COPD, CKD stage IIIa, essential hypertension, prostate cancer, lung cancer with metastatic disease to brain on radiation, chronic systolic congestive heart failure, gout, COPD resented to [facility] ED on 5/24 via EMS for blood in the stool.  Patient reports he was sleeping and woke up to go the bathroom and noticed the blood that was present.  He reports some urinary incontinence and thought it was related to that until he saw the blood.  He had a prior GI bleed a few years ago, denies any pain.  In the ED, temperature 97.4 F, HR 75, RR 16, BP 118/68, SPO2 98% on room air.  WBC 8.2, hemoglobin 11.5, MCV 89.6, platelet count 152.  Sodium 144, potassium 3.5, chloride 114, CO2 22, glucose 131, BUN 26, creatinine 1.24.  AST 15, ALT 19, total bilirubin 0.8.  FOBT positive.  Gastroenterology was consulted.  Hospital service consulted for admission for further evaluation and management of GI bleed.  Hospital course:  Lower GI bleeding likely secondary to hemorrhoidal bleeding Patient presents via EMS from home with acute onsets of blood in the stool.  Initial hemoglobin 11.5.  Gastroenterology was  consulted and followed during hospital course.  GI believes etiology likely small volume hemorrhage from internal hemorrhoids with no significant drop in hemoglobin and patient is hemodynamically stable.  Recommended Anusol suppository per rectum daily at bedtime for 7 days.  Okay for discharge per Dr. Burna Mortimer.  Patient's hemoglobin trended down to a low of 9.0, repeat at time of discharge was 9.7.  Did not require any transfusions during hospitalization.  Recommend outpatient follow-up with PCP with repeat CBC 1 week.  Metastatic lung cancer Patient with multiple brain metastases, recently underwent stereotactic radiosurgery with Dr. Zada Finders and Dr. Tammi Klippel. Continue outpatient follow-up with medical oncology.  Needs continued discussions regarding goals of care as during his last oncology visit it was offered the option of transitioning to comfort care only.  Chronic systolic congestive heart failure, compensated Continue Entresto.  Outpatient follow-up with cardiology  CKD stage IIIa Creatinine 1.24 on admission, stable.  Essential hypertension Continue Entresto and metoprolol succinate.  Gout Continue allopurinol  Discharge Diagnoses:  Principal Problem:   Acute lower GI bleeding Active Problems:   Essential hypertension   Chronic systolic (congestive) heart failure (HCC)   Squamous cell lung cancer, left (HCC)   Chronic kidney disease, stage 3a ([place])   DNR (do not resuscitate)    Discharge Instructions  Discharge Instructions     Call MD for:  difficulty breathing, headache or visual disturbances   Complete by: As directed    Call MD for:  extreme fatigue   Complete by: As directed    Call MD for:  persistant dizziness or light-headedness   Complete by: As directed    Call MD  for:  persistant nausea and vomiting   Complete by: As directed    Call MD for:  severe uncontrolled pain   Complete by: As directed    Call MD for:  temperature >100.4   Complete by: As  directed    Diet - low sodium heart healthy   Complete by: As directed    Increase activity slowly   Complete by: As directed       Allergies as of 04/25/2022       Reactions   Paclitaxel Shortness Of Breath        Medication List     STOP taking these medications    naproxen sodium 220 MG tablet Commonly known as: ALEVE   ondansetron 4 MG tablet Commonly known as: ZOFRAN   Oxycodone HCl 10 MG Tabs   prochlorperazine 10 MG tablet Commonly known as: COMPAZINE   zolpidem 10 MG tablet Commonly known as: AMBIEN       TAKE these medications    acetaminophen 325 MG tablet Commonly known as: TYLENOL Take 2 tablets (650 mg total) by mouth every 6 (six) hours as needed for mild pain or fever (or Fever >/= 101).   albuterol 108 (90 Base) MCG/ACT inhaler Commonly known as: VENTOLIN HFA 2 puffs every 6 (six) hours as needed. Shortness of breath   allopurinol 100 MG tablet Commonly known as: ZYLOPRIM Take 100 mg by mouth every morning.   CALCIUM 600-D PO Take 1 tablet by mouth every morning.   dexamethasone 4 MG tablet Commonly known as: DECADRON One tab po TID What changed:  how much to take how to take this when to take this   Entresto 24-26 MG Generic drug: sacubitril-valsartan TAKE 1 TABLET BY MOUTH TWICE DAILY What changed: how to take this   folic acid 1 MG tablet Commonly known as: FOLVITE Take 1 mg by mouth every morning.   hydrocortisone 25 MG suppository Commonly known as: ANUSOL-HC Place 1 suppository (25 mg total) rectally at bedtime for 7 days.   metoprolol succinate 25 MG 24 hr tablet Commonly known as: TOPROL-XL TAKE 1/2 TABLET(12.5 MG) BY MOUTH DAILY What changed: See the new instructions.   OXYGEN Inhale into the lungs as needed (shortness of breath).   pantoprazole 40 MG tablet Commonly known as: PROTONIX Take 40 mg by mouth every morning.   thiamine 100 MG tablet Commonly known as: Vitamin B-1 Take 100 mg by mouth every  morning.        Follow-up Information     Maryella Shivers, MD. Schedule an appointment as soon as possible for a visit in 1 week(s).   Specialty: Family Medicine Why: repeat CBC to assess hemoglobin level Contact information: [address] [place] [address] [phone]                Allergies  Allergen Reactions   Paclitaxel Shortness Of Breath    Consultations: [facility], Dr. Silverio Decamp   Procedures/Studies: No results found.   Subjective: Patient seen examined bedside, resting comfortably.  No specific complaints this morning.  Seen by gastroenterology with recommendations of Anusol suppositories for likely hemorrhoidal bleeding causing his lower GI bleed.  Hemoglobin has remained stable and now up trended to 9.7 at time of discharge.  Discussed with patient recommend follow-up with PCP in 1 week for repeat labs.  No other questions or concerns at this time.  Denies headache, no dizziness, no chest pain, no palpitations, no shortness of breath, no abdominal pain,  no fever/chills/night sweats, no nausea/vomiting/diarrhea, no focal weakness, no cough/congestion, no fatigue, no paresthesias.  No acute events overnight per nurse staff.  Discharge Exam: Vitals:   04/25/22 0524 04/25/22 0815  BP: 102/61 127/63  Pulse: 79 72  Resp: 18 17  Temp: 97.6 F (36.4 C) 97.7 F (36.5 C)  SpO2: 100% 100%   Vitals:   04/24/22 2101 04/25/22 0012 04/25/22 0524 04/25/22 0815  BP: 112/75 (!) 105/57 102/61 127/63  Pulse: 76 80 79 72  Resp: 20 18 18 17   Temp: 97.7 F (36.5 C) (!) 97.4 F (36.3 C) 97.6 F (36.4 C) 97.7 F (36.5 C)  TempSrc: Oral Oral Oral Oral  SpO2: 100% 100% 100% 100%  Weight:      Height:        Physical Exam: GEN: NAD, alert and oriented x 3, wd/wn HEENT: NCAT, PERRL, EOMI, sclera clear, MMM PULM: CTAB w/o wheezes/crackles, normal respiratory effort CV: RRR w/o M/G/R GI: abd soft, NTND, NABS, no R/G/M MSK: no  peripheral edema, muscle strength globally intact 5/5 bilateral upper/lower extremities NEURO: CN II-XII intact, no focal deficits, sensation to light touch intact PSYCH: normal mood/affect Integumentary: dry/intact, no rashes or wounds    The results of significant diagnostics from this hospitalization (including imaging, microbiology, ancillary and laboratory) are listed below for reference.     Microbiology: No results found for this or any previous visit (from the past 240 hour(s)).   Labs: BNP (last 3 results) Recent Labs    01/13/22 0548 01/14/22 0358 01/15/22 0842  BNP 760.8* 433.7* 509.3*   Basic Metabolic Panel: Recent Labs  Lab 04/24/22 0906  NA 144  K 3.5  CL 114*  CO2 22  GLUCOSE 131*  BUN 26*  CREATININE 1.24  CALCIUM 8.2*   Liver Function Tests: Recent Labs  Lab 04/24/22 0906  AST 15  ALT 19  ALKPHOS 67  BILITOT 0.8  PROT 6.1*  ALBUMIN 2.6*   No results for input(s): LIPASE, AMYLASE in the last 168 hours. No results for input(s): AMMONIA in the last 168 hours. CBC: Recent Labs  Lab 04/24/22 0906 04/24/22 1624 04/24/22 2046 04/25/22 0234 04/25/22 0940  WBC 8.2 7.9 6.7 6.3 7.5  HGB 11.5* 9.8* 9.1* 9.0* 9.7*  HCT 38.8* 32.0* 29.1* 29.9* 31.3*  MCV 89.6 88.9 86.1 87.9 86.9  PLT 152 150 148* 142* 148*   Cardiac Enzymes: No results for input(s): CKTOTAL, CKMB, CKMBINDEX, TROPONINI in the last 168 hours. BNP: Invalid input(s): POCBNP CBG: No results for input(s): GLUCAP in the last 168 hours. D-Dimer No results for input(s): DDIMER in the last 72 hours. Hgb A1c No results for input(s): HGBA1C in the last 72 hours. Lipid Profile No results for input(s): CHOL, HDL, LDLCALC, TRIG, CHOLHDL, LDLDIRECT in the last 72 hours. Thyroid function studies No results for input(s): TSH, T4TOTAL, T3FREE, THYROIDAB in the last 72 hours.  Invalid input(s): FREET3 Anemia work up No results for input(s): VITAMINB12, FOLATE, FERRITIN, TIBC, IRON,  RETICCTPCT in the last 72 hours. Urinalysis    Component Value Date/Time   COLORURINE STRAW (A) 01/12/2022 1050   APPEARANCEUR CLEAR 01/12/2022 1050   LABSPEC 1.008 01/12/2022 1050   PHURINE 7.0 01/12/2022 1050   GLUCOSEU NEGATIVE 01/12/2022 1050   HGBUR NEGATIVE 01/12/2022 1050   BILIRUBINUR NEGATIVE 01/12/2022 1050   KETONESUR NEGATIVE 01/12/2022 1050   PROTEINUR NEGATIVE 01/12/2022 1050   NITRITE NEGATIVE 01/12/2022 1050   LEUKOCYTESUR NEGATIVE 01/12/2022 1050   Sepsis Labs Invalid input(s): PROCALCITONIN,  WBC,  [place] Microbiology No results found for this or any previous visit (from the past 240 hour(s)).   Time coordinating discharge: Over 30 minutes  SIGNED:   Kashauna Celmer J [place] DO  [facility] 04/25/2022, 10:25 AM

## 2022-04-25 NOTE — Care Management CC44 (Signed)
Condition Code 44 Documentation Completed  Patient Details  Name: Robert Hamilton MRN: [id number] Date of Birth: Dec 15, 1933   Condition Code 44 given:  Yes Patient signature on Condition Code 44 notice:  Yes Documentation of 2 MD's agreement:  Yes Code 44 added to claim:  Yes  Called daughter and discussed via phone   Marilu Favre, RN 04/25/2022, 11:25 AM

## 2022-04-25 NOTE — Progress Notes (Addendum)
Daily Progress Note  Hospital Day: 2  Chief Complaint: rectal bleeding  Brief History Robert Hamilton is a 88 y.o. male with a pmh not limited to GERD, CKD, CHF, HTN, metastatic squamous cell lung cancer undergoing chemotherapy and brain radiation.    Assessment  88 yo male with multiple medical problems presenting yesterday with painless rectal bleeding.  Hemodynamically stable and no decline in hgb.  Suspected hemorrhoidal bleeding. No BMs/ bleeding today.;    Metastatic lung cancer undergoing chemotherapy and radiation to brain.    [place] anemia, chronic. Probably due in part to chemotherapy   GERD, on PPI at home   See PMH for additional medical problems   Plan   Continue Anusol suppositories for total of 7 days.   Subjective  Slight headache, otherwise feels okay. Small bowel movement yesterday with a small amount of "old blood" .    Objective  Imaging No results found.  Lab Results: Recent Labs    04/24/22 2046 04/25/22 0234 04/25/22 0940  WBC 6.7 6.3 7.5  HGB 9.1* 9.0* 9.7*  HCT 29.1* 29.9* 31.3*  PLT 148* 142* 148*   BMET Recent Labs    04/24/22 0906  NA 144  K 3.5  CL 114*  CO2 22  GLUCOSE 131*  BUN 26*  CREATININE 1.24  CALCIUM 8.2*   LFT Recent Labs    04/24/22 0906  PROT 6.1*  ALBUMIN 2.6*  AST 15  ALT 19  ALKPHOS 67  BILITOT 0.8   PT/INR No results for input(s): LABPROT, INR in the last 72 hours.   Scheduled inpatient medications:   allopurinol  100 mg Oral Daily   folic acid  1 mg Oral Daily   hydrocortisone  25 mg Rectal QHS   metoprolol succinate  25 mg Oral Daily   pantoprazole  40 mg Oral Daily   sacubitril-valsartan  1 tablet Oral BID   sodium chloride flush  3 mL Intravenous Q12H   thiamine  100 mg Oral Daily   Continuous inpatient infusions:   lactated ringers 100 mL/hr at 04/24/22 1403   PRN inpatient medications: acetaminophen **OR** acetaminophen, albuterol, hydrALAZINE, morphine injection, ondansetron  **OR** ondansetron (ZOFRAN) IV, oxyCODONE  Vital signs in last 24 hours: Temp:  [97.4 F (36.3 C)-98.1 F (36.7 C)] 98.1 F (36.7 C) (05/25 1101) Pulse Rate:  [50-98] 95 (05/25 1101) Resp:  [9-24] 19 (05/25 1101) BP: (92-135)/(56-78) 119/70 (05/25 1101) SpO2:  [97 %-100 %] 100 % (05/25 1101) Weight:  [65 kg] 65 kg (05/24 1335) Last BM Date : 04/24/22  Intake/Output Summary (Last 24 hours) at 04/25/2022 1220 Last data filed at 04/24/2022 1614 Gross per 24 hour  Intake 189.41 ml  Output --  Net 189.41 ml   Physical Exam:  General: Alert male in NAD Heart:  Regular rate and rhythm. No lower extremity edema Pulmonary: Normal respiratory effort Abdomen: Soft, nondistended, nontender. Normal bowel sounds.  Neurologic: Alert and oriented Psych: Pleasant. Cooperative.    Intake/Output from previous day: 05/24 0701 - 05/25 0700 In: 189.4 [I.V.:189.4] Out: -  Intake/Output this shift: No intake/output data recorded.    Principal Problem:   Acute lower GI bleeding Active Problems:   Essential hypertension   Chronic systolic (congestive) heart failure (HCC)   Squamous cell lung cancer, left (HCC)   Chronic kidney disease, stage 3a ([place])   DNR (do not resuscitate)     LOS: 1 day   Tye Savoy ,NP 04/25/2022, 12:20 PM    Attending  physician's note   I have taken a history, reviewed the chart and examined the patient. I performed a substantive portion of this encounter, including complete performance of at least one of the key components, in conjunction with the APP. I agree with the APP's note, impression and recommendations.    No further bleeding. Hgb has declined to 9.7. No transfusion requirement Follow up with Heme Onc Continue Anusol suppositories Call GI office with recurrent rectal bleeding OK to discharge home from GI standpoint  The patient was provided an opportunity to ask questions and all were answered. The patient agreed with the plan and demonstrated  an understanding of the instructions.   Damaris Hippo , MD [phone]

## 2022-04-25 NOTE — Plan of Care (Signed)
  Problem: Nutrition: Goal: Adequate nutrition will be maintained Outcome: Progressing   Problem: Pain Managment: Goal: General experience of comfort will improve Outcome: Progressing   Problem: Safety: Goal: Ability to remain free from injury will improve Outcome: Progressing   

## 2022-04-27 ENCOUNTER — Other Ambulatory Visit: Payer: Self-pay | Admitting: Oncology

## 2022-05-01 ENCOUNTER — Encounter: Payer: Self-pay | Admitting: Urology

## 2022-05-01 NOTE — Progress Notes (Signed)
Telephone appointment. I spoke w/ patient's daughter Ms. Forde Radon, verified her identity and began nursing interview. She reports that patient still has some significant total LT sided weakness, but is otherwise doing okay.  Meaningful use complete.  Reminded Ms. Shon Baton of patient's 10:30am-05/02/22 telephone appointment w/ Ashlyn Bruning PA-C. I left my extension [phone] in case patient needs anything. Patient verbalized understanding.  Patient preferred contact [phone]

## 2022-05-02 ENCOUNTER — Ambulatory Visit
Admission: RE | Admit: 2022-05-02 | Discharge: 2022-05-02 | Disposition: A | Discharge status: Home / Self Care | Payer: Medicare Other | Source: Ambulatory Visit | Attending: Urology | Admitting: Urology

## 2022-05-02 DIAGNOSIS — C7931 Secondary malignant neoplasm of brain: Secondary | ICD-10-CM

## 2022-05-03 ENCOUNTER — Ambulatory Visit: Payer: Medicare Other | Admitting: Oncology

## 2022-05-03 NOTE — Progress Notes (Signed)
Radiation Oncology         ([phone]) [phone] ________________________________  Name: Robert Hamilton MRN: [id number]  Date: 05/02/2022  DOB: 1933-12-15  Post Treatment Note  CC: Robert Shivers, MD  Robert Shivers, MD  Diagnosis:    88 y.o. gentleman with a solitary 1.7 cm right thalamic brain metastasis from PD-L1 positive (TPS=1%) squamous cell carcinoma of the left upper lobe of the lung - Stage IVA     Interval Since Last Radiation:  4.5 weeks   04/01/22:    Narrative:  I spoke with the patient to conduct his routine scheduled 1 month follow up visit via telephone to spare the patient unnecessary potential exposure in the healthcare setting during the current COVID-19 pandemic.  The patient was notified in advance and gave permission to proceed with this visit format.  He tolerated radiation treatment relatively well without any ill side effects and was discharged home in stable condition.                              On review of systems, the patient states that he has can continued with persistent generalized weakness, fatigue and imbalance which is not progressively worsening but is not improving.  He denies any headaches, recent changes in visual or auditory acuity, difficulty with speech, tremor or seizure activity.  He is still taking dexamethasone 4 mg p.o. twice daily which he has been on for months now, having started this well before his treatment.  Unfortunately, he did not get instructions for medication taper to his knowledge.  He has been off his maintenance systemic therapy since April 2023.  He also continues with decreased appetite but is eating regularly in an attempt to maintain his weight.  He has a scheduled follow-up visit with Dr. Bobby Hamilton on 05/03/2022.  He was planning to have disease restaging systemic imaging prior to this visit but that was never scheduled.  They will address this at his follow-up visit on 05/03/2022.  ALLERGIES:  is allergic to  paclitaxel.  Meds: Current Outpatient Medications  Medication Sig Dispense Refill   acetaminophen (TYLENOL) 325 MG tablet Take 2 tablets (650 mg total) by mouth every 6 (six) hours as needed for mild pain or fever (or Fever >/= 101).     allopurinol (ZYLOPRIM) 100 MG tablet Take 100 mg by mouth every morning.     Calcium Carb-Cholecalciferol (CALCIUM 600-D PO) Take 1 tablet by mouth every morning.     dexamethasone (DECADRON) 4 MG tablet One tab po TID (Patient taking differently: Take 4 mg by mouth 2 (two) times daily. One tab po TID) 90 tablet 1   ENTRESTO 24-26 MG TAKE 1 TABLET BY MOUTH TWICE DAILY (Patient taking differently: 1 tablet 2 (two) times daily.) 60 tablet 1   folic acid (FOLVITE) 1 MG tablet Take 1 mg by mouth every morning.     metoprolol succinate (TOPROL-XL) 25 MG 24 hr tablet TAKE 1/2 TABLET(12.5 MG) BY MOUTH DAILY (Patient taking differently: 12.5 mg every morning.) 30 tablet 1   OXYGEN Inhale into the lungs as needed (shortness of breath).     pantoprazole (PROTONIX) 40 MG tablet Take 40 mg by mouth every morning.     thiamine (VITAMIN B-1) 100 MG tablet Take 100 mg by mouth every morning.     albuterol (VENTOLIN HFA) 108 (90 Base) MCG/ACT inhaler 2 puffs every 6 (six) hours as needed. Shortness of breath (Patient not taking: Reported  on 04/24/2022)     No current facility-administered medications for this encounter.    Physical Findings:  vitals were not taken for this visit.  Pain Assessment Pain Score: 0-No pain/10 Unable to assess due to telephone follow-up visit format.  Lab Findings: Lab Results  Component Value Date   WBC 7.5 04/25/2022   HGB 9.7 (L) 04/25/2022   HCT 31.3 (L) 04/25/2022   MCV 86.9 04/25/2022   PLT 148 (L) 04/25/2022     Radiographic Findings: No results found.  Impression/Plan: 1.  88 y.o. gentleman with a solitary 1.7 cm right thalamic brain metastasis from PD-L1 positive (TPS=1%) squamous cell carcinoma of the left upper lobe of  the lung - Stage IVA . He appears to have tolerated his recent SRS brain treatment well.  He has continued with generalized weakness and imbalance, likely secondary to prolonged steroid use as he was not aware that he should taper off the steroids after treatment.  I have provided written instructions for steroid taper today and emailed those to his daughter, Robert Hamilton, as requested.  He will keep his scheduled follow-up visit with Dr. Bobby Hamilton on 05/03/2022 to discuss continued management of his systemic disease.  We will plan to obtain a posttreatment MRI brain scan in August 2023, approximately 3 months after treatment, to assess his treatment response, and pending this scan is stable, we will then plan to move forward with serial MRI brain scans every 3 months to continue to closely monitor for any evidence of disease recurrence or progression.  I will follow-up with him by telephone following each scan to review results and recommendations from multidisciplinary brain conference.  They have our contact information and knows that they are welcome to call with any questions or concerns in the interim.   Robert Johns, PA-C

## 2022-05-03 NOTE — Progress Notes (Signed)
  Radiation Oncology         (336) [phone] ________________________________  Name: Robert Hamilton MRN: [id number]  Date: 04/01/2022  DOB: [birth year]  End of Treatment Note  Diagnosis:   88 y.o. gentleman with a solitary 1.7 cm right thalamic brain metastasis from PD-L1 positive (TPS=1%) squamous cell carcinoma of the left upper lobe of the lung - Stage IVA     Indication for treatment:  Palliation       Radiation treatment dates:   04/01/22  Site/dose/beams/energy:    Narrative: The patient tolerated radiation treatment relatively well without any ill side effects and was discharged home in stable condition.  Plan: The patient has completed radiation treatment. The patient will return to radiation oncology clinic for routine followup in one month. I advised them to call or return sooner if they have any questions or concerns related to their recovery or treatment. ________________________________  Sheral Apley. Tammi Klippel, M.D.

## 2022-05-04 ENCOUNTER — Other Ambulatory Visit: Payer: Self-pay | Admitting: Cardiology

## 2022-05-04 DIAGNOSIS — I5022 Chronic systolic (congestive) heart failure: Secondary | ICD-10-CM

## 2022-05-10 DIAGNOSIS — Z7689 Persons encountering health services in other specified circumstances: Secondary | ICD-10-CM | POA: Diagnosis not present

## 2022-05-10 DIAGNOSIS — C3490 Malignant neoplasm of unspecified part of unspecified bronchus or lung: Secondary | ICD-10-CM | POA: Diagnosis not present

## 2022-05-10 DIAGNOSIS — K625 Hemorrhage of anus and rectum: Secondary | ICD-10-CM | POA: Diagnosis not present

## 2022-05-22 ENCOUNTER — Other Ambulatory Visit: Payer: Self-pay | Admitting: Radiation Therapy

## 2022-05-22 ENCOUNTER — Telehealth: Payer: Self-pay | Admitting: Radiation Therapy

## 2022-05-22 DIAGNOSIS — C7931 Secondary malignant neoplasm of brain: Secondary | ICD-10-CM

## 2022-05-22 NOTE — Telephone Encounter (Signed)
I called Robert Hamilton about the upcoming brain MRI and follow-up appointment we have scheduled later in July. During our conversation he shared that he has not been feeling well. He does not have any energy and finds himself only laying in the bed or sitting on the couch. His breathing is labored when he walks, and his balance is not particularly good either. He has almost completed the steroid taper instructions given by Ashlyn during his last follow-up.   Robert Hamilton has requested to come in and be seen by a provider. Per Ashlyn Bruning's request, I have entered in a referral for Robert Hamilton to see Dr. Mickeal Skinner for evaluation.   I spoke with both Robert Hamilton and his daughter about this. They are expecting a call to schedule.   The patient's daughter also shared that Robert Hamilton has discontinued his systemic treatment with Dr. Bobby Rumpf due to the side effects and does not have intentions of following up.   Mont Dutton R.T.(R)(T) Radiation Special Procedures Navigator

## 2022-05-23 ENCOUNTER — Telehealth: Payer: Self-pay | Admitting: Internal Medicine

## 2022-05-23 NOTE — Telephone Encounter (Signed)
Scheduled appt per 6/21 referral. Pt's daughter is aware of appt date and time. Pt's daughter is aware to arrive 15 mins prior to appt time and to bring and updated insurance card. Pt's daughter is aware of appt location.

## 2022-05-30 ENCOUNTER — Inpatient Hospital Stay: Payer: Medicare Other | Attending: Hematology and Oncology | Admitting: Internal Medicine

## 2022-05-30 ENCOUNTER — Other Ambulatory Visit: Payer: Self-pay

## 2022-05-30 VITALS — BP 128/82 | HR 87 | Temp 97.7°F | Resp 19 | Ht 69.0 in

## 2022-05-30 DIAGNOSIS — Z87891 Personal history of nicotine dependence: Secondary | ICD-10-CM | POA: Diagnosis not present

## 2022-05-30 DIAGNOSIS — C7931 Secondary malignant neoplasm of brain: Secondary | ICD-10-CM | POA: Diagnosis not present

## 2022-05-30 DIAGNOSIS — I509 Heart failure, unspecified: Secondary | ICD-10-CM | POA: Diagnosis not present

## 2022-05-30 DIAGNOSIS — N189 Chronic kidney disease, unspecified: Secondary | ICD-10-CM | POA: Insufficient documentation

## 2022-05-30 DIAGNOSIS — C3492 Malignant neoplasm of unspecified part of left bronchus or lung: Secondary | ICD-10-CM | POA: Insufficient documentation

## 2022-05-30 DIAGNOSIS — I130 Hypertensive heart and chronic kidney disease with heart failure and stage 1 through stage 4 chronic kidney disease, or unspecified chronic kidney disease: Secondary | ICD-10-CM | POA: Diagnosis not present

## 2022-05-30 NOTE — Progress Notes (Signed)
[facility] [place], [place] [address] [phone]   New Patient Evaluation  Date of Service: 05/30/22 Patient Name: Dennis Hegeman Patient MRN: [id number] Patient DOB: 1934/08/09 Provider: Ventura Sellers, MD  Identifying Statement:  Charon Akamine is a 88 y.o. male with Metastasis to brain [facility]) who presents for initial consultation and evaluation regarding cancer associated neurologic deficits.    Referring Provider: Tyler Pita, MD [address] [place],  [place] [address]  Primary Cancer:  Oncologic History: Oncology History  Squamous cell lung cancer, left ([place])  06/26/2021 Initial Diagnosis   Squamous cell lung cancer, left ([place])   07/11/2021 Cancer Staging   Staging form: Lung, AJCC 8th Edition - Clinical stage from 07/11/2021: Stage IVA (cT3, cN0, cM1b) - Signed by [name], MD on 07/11/2021 Histopathologic type: Squamous cell carcinoma, NOS Stage prefix: Initial diagnosis   09/18/2021 - 09/18/2021 Chemotherapy   Patient is on Treatment Plan : LUNG NSCLC Carboplatin + Paclitaxel + Pembrolizumab q21d x 4 cycles / Pembrolizumab Maintenance Q21D     10/09/2021 -  Chemotherapy   Patient is on Treatment Plan : LUNG NSCLC CARBOplatin + Abraxane + Pembrolizumab q21d X 4 Cycles / Pembrolizumab Maintenance q21d     01/15/2022 Imaging   MRI brain with and without contrast  IMPRESSION: Lobulated enhancing lesion or 2 adjacent subcentimeter enhancing lesions in the right thalamus with surrounding edema, concerning for metastatic disease. No significant mass effect or midline shift.    01/28/2022 Imaging   MRI brain with and without contrast  IMPRESSION: Stable bilobed right thalamic lesion and associated edema. No new abnormality.    CNS Oncologic History 04/02/22: SRS to R thalamic metastasis [facility])  History of Present Illness: The patient's records from the referring physician were  obtained and reviewed and the patient interviewed to confirm this HPI.  Osvaldo Angst presents today for evaluation and follow up for brain metastasis.  He tolerated treatment in May well without complication.  Steroids have been fully weaned without ill-effect.  Left arm weakness is present but static at this time.  Gait is limited mainly by pain and orthopedic issues.  He uses a walker to ambulate at home.  Remains on observation systemically with Dr. Bobby Rumpf.  Denies seizures, headaches.  Medications: Current Outpatient Medications on File Prior to Visit  Medication Sig Dispense Refill   acetaminophen (TYLENOL) 325 MG tablet Take 2 tablets (650 mg total) by mouth every 6 (six) hours as needed for mild pain or fever (or Fever >/= 101). (Patient not taking: Reported on 05/30/2022)     albuterol (VENTOLIN HFA) 108 (90 Base) MCG/ACT inhaler 2 puffs every 6 (six) hours as needed. Shortness of breath (Patient not taking: Reported on 04/24/2022)     allopurinol (ZYLOPRIM) 100 MG tablet Take 100 mg by mouth every morning. (Patient not taking: Reported on 05/30/2022)     Calcium Carb-Cholecalciferol (CALCIUM 600-D PO) Take 1 tablet by mouth every morning. (Patient not taking: Reported on 05/30/2022)     dexamethasone (DECADRON) 4 MG tablet One tab po TID (Patient not taking: Reported on 05/30/2022) 90 tablet 1   ENTRESTO 24-26 MG TAKE 1 TABLET BY MOUTH TWICE DAILY (Patient not taking: Reported on 05/30/2022) 60 tablet 1   folic acid (FOLVITE) 1 MG tablet Take 1 mg by mouth every morning. (Patient not taking: Reported on 05/30/2022)     metoprolol succinate (TOPROL-XL) 25 MG 24 hr tablet TAKE 1/2  TABLET(12.5 MG) BY MOUTH DAILY (Patient not taking: Reported on 05/30/2022) 30 tablet 1   OXYGEN Inhale into the lungs as needed (shortness of breath). (Patient not taking: Reported on 05/30/2022)     pantoprazole (PROTONIX) 40 MG tablet Take 40 mg by mouth every morning. (Patient not taking: Reported on 05/30/2022)      thiamine (VITAMIN B-1) 100 MG tablet Take 100 mg by mouth every morning. (Patient not taking: Reported on 05/30/2022)     No current facility-administered medications on file prior to visit.    Allergies:  Allergies  Allergen Reactions   Paclitaxel Shortness Of Breath   Past Medical History:  Past Medical History:  Diagnosis Date   Anemia    Anemia 09/27/2021   Cancer ([place])    CHF (congestive heart failure) (HCC)    Chronic kidney disease    COPD (chronic obstructive pulmonary disease) (HCC)    Diverticulosis    GERD (gastroesophageal reflux disease)    Hypertension    Metastasis to brain ([place]) 03/20/2022   Past Surgical History:  Past Surgical History:  Procedure Laterality Date   CRYOTHERAPY  06/26/2021   Procedure: CRYOTHERAPY;  Surgeon: Garner Nash, DO;  Location: [facility];  Service: Pulmonary;;   HEMOSTASIS CONTROL  06/26/2021   Procedure: HEMOSTASIS CONTROL;  Surgeon: Garner Nash, DO;  Location: [facility];  Service: Pulmonary;;   HERNIA REPAIR     PROSTATE SURGERY     PROSTATECTOMY     VIDEO BRONCHOSCOPY Left 06/26/2021   Procedure: VIDEO BRONCHOSCOPY WITHOUT FLUORO;  Surgeon: Garner Nash, DO;  Location: [facility];  Service: Pulmonary;  Laterality: Left;  possible cryotherapy   Social History:  Social History   Socioeconomic History   Marital status: Divorced    Spouse name: Not on file   Number of children: 2   Years of education: Not on file   Highest education level: Not on file  Occupational History   Occupation: retired  Tobacco Use   Smoking status: Former    Packs/day: 0.25    Types: Cigarettes    Quit date: 12/2020    Years since quitting: 1.4   Smokeless tobacco: Never   Tobacco comments:    It's been a while  Vaping Use   Vaping Use: Never used  Substance and Sexual Activity   Alcohol use: Not Currently    Comment: stopped 12/2018, h/o heavy use   Drug use: No   Sexual activity: Yes  Other Topics Concern   Not on  file  Social History Narrative   Not on file   Social Determinants of Health   Financial Resource Strain: Not on file  Food Insecurity: Not on file  Transportation Needs: Not on file  Physical Activity: Not on file  Stress: Not on file  Social Connections: Not on file  Intimate Partner Violence: Not on file   Family History:  Family History  Problem Relation Age of Onset   Heart attack Mother    Hypertension Mother    Hypertension Father    Hypertension Sister    Hypertension Sister    Hypertension Sister    Hypertension Sister     Review of Systems: Constitutional: Doesn't report fevers, chills or abnormal weight loss Eyes: Doesn't report blurriness of vision Ears, nose, mouth, throat, and face: Doesn't report sore throat Respiratory: Doesn't report cough, dyspnea or wheezes Cardiovascular: Doesn't report palpitation, chest discomfort  Gastrointestinal:  Doesn't report nausea, constipation, diarrhea GU: Doesn't report incontinence Skin: Doesn't report  skin rashes Neurological: Per HPI Musculoskeletal: Doesn't report joint pain Behavioral/Psych: Doesn't report anxiety  Physical Exam: Vitals:   05/30/22 1159  BP: 128/82  Pulse: 87  Resp: 19  Temp: 97.7 F (36.5 C)  SpO2: 99%   KPS: 60. General: Alert, cooperative, pleasant, in no acute distress Head: Normal EENT: No conjunctival injection or scleral icterus.  Lungs: Resp effort normal Cardiac: Regular rate Abdomen: Non-distended abdomen Skin: No rashes cyanosis or petechiae. Extremities: No clubbing or edema  Neurologic Exam: Mental Status: Awake, alert, attentive to examiner. Oriented to self and environment. Language is fluent with intact comprehension.  Cranial Nerves: Visual acuity is grossly normal. Visual fields are full. Extra-ocular movements intact. No ptosis. Face is symmetric Motor: Tone and bulk are normal. Power is 4/5 in left arm, 5- in left leg. Reflexes are symmetric, no pathologic reflexes  present.  Sensory: Intact to light touch Gait: Walker assisted   Labs: I have reviewed the data as listed    Component Value Date/Time   NA 144 04/24/2022 0906   NA 144 03/20/2022 0000   K 3.5 04/24/2022 0906   CL 114 (H) 04/24/2022 0906   CO2 22 04/24/2022 0906   GLUCOSE 131 (H) 04/24/2022 0906   BUN 26 (H) 04/24/2022 0906   BUN 22 (A) 03/20/2022 0000   CREATININE 1.24 04/24/2022 0906   CREATININE 1.37 (H) 05/22/2017 0954   CALCIUM 8.2 (L) 04/24/2022 0906   PROT 6.1 (L) 04/24/2022 0906   ALBUMIN 2.6 (L) 04/24/2022 0906   AST 15 04/24/2022 0906   ALT 19 04/24/2022 0906   ALKPHOS 67 04/24/2022 0906   BILITOT 0.8 04/24/2022 0906   GFRNONAA 57 (L) 04/24/2022 0906   GFRAA 55 (L) 03/24/2019 1008   Lab Results  Component Value Date   WBC 7.5 04/25/2022   NEUTROABS 7.07 03/20/2022   HGB 9.7 (L) 04/25/2022   HCT 31.3 (L) 04/25/2022   MCV 86.9 04/25/2022   PLT 148 (L) 04/25/2022    Assessment/Plan Metastasis to brain [facility])  Osvaldo Angst is clinically stable today, now almost 2 months removed from radiosurgery to right thalamic metastasis.  He has successfully weaned dexamethasone without focal recurrence.    He will continue to follow with Dr. Bobby Rumpf for systemic therapy, currently on observation.  We spent twenty additional minutes teaching regarding the natural history, biology, and historical experience in the treatment of neurologic complications of cancer.   We appreciate the opportunity to participate in the care of Norval Slaven.   We ask that Lorain Keast return to clinic in 1 months following next brain MRI, or sooner as needed.  All questions were answered. The patient knows to call the clinic with any problems, questions or concerns. No barriers to learning were detected.  The total time spent in the encounter was 40 minutes and more than 50% was on counseling and review of test results   Ventura Sellers, MD Medical Director of  Neuro-Oncology [facility] [place] 05/30/22 3:08 PM

## 2022-05-31 ENCOUNTER — Telehealth: Payer: Self-pay | Admitting: Internal Medicine

## 2022-05-31 NOTE — Telephone Encounter (Signed)
Per 6/29 los called and spoke to pot daughter about pt appointment .  She confirmed the appointment

## 2022-06-15 ENCOUNTER — Emergency Department (HOSPITAL_COMMUNITY): Payer: Medicare Other

## 2022-06-15 ENCOUNTER — Encounter (HOSPITAL_COMMUNITY): Payer: Self-pay | Admitting: Emergency Medicine

## 2022-06-15 ENCOUNTER — Inpatient Hospital Stay (HOSPITAL_COMMUNITY)
Admission: EM | Admit: 2022-06-15 | Discharge: 2022-07-02 | DRG: 871 | Disposition: E | Payer: Medicare Other | Attending: Family Medicine | Admitting: Family Medicine

## 2022-06-15 ENCOUNTER — Other Ambulatory Visit: Payer: Self-pay

## 2022-06-15 DIAGNOSIS — R001 Bradycardia, unspecified: Secondary | ICD-10-CM | POA: Diagnosis not present

## 2022-06-15 DIAGNOSIS — I447 Left bundle-branch block, unspecified: Secondary | ICD-10-CM | POA: Diagnosis present

## 2022-06-15 DIAGNOSIS — J189 Pneumonia, unspecified organism: Secondary | ICD-10-CM | POA: Diagnosis present

## 2022-06-15 DIAGNOSIS — I491 Atrial premature depolarization: Secondary | ICD-10-CM | POA: Diagnosis not present

## 2022-06-15 DIAGNOSIS — N1831 Chronic kidney disease, stage 3a: Secondary | ICD-10-CM | POA: Diagnosis present

## 2022-06-15 DIAGNOSIS — M109 Gout, unspecified: Secondary | ICD-10-CM | POA: Diagnosis present

## 2022-06-15 DIAGNOSIS — Z87891 Personal history of nicotine dependence: Secondary | ICD-10-CM

## 2022-06-15 DIAGNOSIS — R451 Restlessness and agitation: Secondary | ICD-10-CM | POA: Diagnosis not present

## 2022-06-15 DIAGNOSIS — I700 Atherosclerosis of aorta: Secondary | ICD-10-CM | POA: Diagnosis not present

## 2022-06-15 DIAGNOSIS — N179 Acute kidney failure, unspecified: Secondary | ICD-10-CM | POA: Diagnosis not present

## 2022-06-15 DIAGNOSIS — E872 Acidosis, unspecified: Secondary | ICD-10-CM | POA: Diagnosis not present

## 2022-06-15 DIAGNOSIS — J90 Pleural effusion, not elsewhere classified: Secondary | ICD-10-CM | POA: Diagnosis not present

## 2022-06-15 DIAGNOSIS — I340 Nonrheumatic mitral (valve) insufficiency: Secondary | ICD-10-CM | POA: Diagnosis not present

## 2022-06-15 DIAGNOSIS — Z86711 Personal history of pulmonary embolism: Secondary | ICD-10-CM | POA: Diagnosis not present

## 2022-06-15 DIAGNOSIS — Z66 Do not resuscitate: Secondary | ICD-10-CM | POA: Diagnosis not present

## 2022-06-15 DIAGNOSIS — I130 Hypertensive heart and chronic kidney disease with heart failure and stage 1 through stage 4 chronic kidney disease, or unspecified chronic kidney disease: Secondary | ICD-10-CM | POA: Diagnosis not present

## 2022-06-15 DIAGNOSIS — C7931 Secondary malignant neoplasm of brain: Secondary | ICD-10-CM | POA: Diagnosis not present

## 2022-06-15 DIAGNOSIS — Z888 Allergy status to other drugs, medicaments and biological substances status: Secondary | ICD-10-CM

## 2022-06-15 DIAGNOSIS — I071 Rheumatic tricuspid insufficiency: Secondary | ICD-10-CM | POA: Diagnosis present

## 2022-06-15 DIAGNOSIS — Z515 Encounter for palliative care: Secondary | ICD-10-CM

## 2022-06-15 DIAGNOSIS — C3492 Malignant neoplasm of unspecified part of left bronchus or lung: Secondary | ICD-10-CM | POA: Diagnosis present

## 2022-06-15 DIAGNOSIS — I5042 Chronic combined systolic (congestive) and diastolic (congestive) heart failure: Secondary | ICD-10-CM | POA: Diagnosis present

## 2022-06-15 DIAGNOSIS — Z20822 Contact with and (suspected) exposure to covid-19: Secondary | ICD-10-CM | POA: Diagnosis not present

## 2022-06-15 DIAGNOSIS — J948 Other specified pleural conditions: Secondary | ICD-10-CM | POA: Diagnosis not present

## 2022-06-15 DIAGNOSIS — J439 Emphysema, unspecified: Secondary | ICD-10-CM | POA: Diagnosis not present

## 2022-06-15 DIAGNOSIS — Z79899 Other long term (current) drug therapy: Secondary | ICD-10-CM

## 2022-06-15 DIAGNOSIS — K219 Gastro-esophageal reflux disease without esophagitis: Secondary | ICD-10-CM | POA: Diagnosis not present

## 2022-06-15 DIAGNOSIS — A419 Sepsis, unspecified organism: Principal | ICD-10-CM | POA: Diagnosis present

## 2022-06-15 DIAGNOSIS — R0602 Shortness of breath: Secondary | ICD-10-CM

## 2022-06-15 DIAGNOSIS — R6520 Severe sepsis without septic shock: Secondary | ICD-10-CM | POA: Diagnosis present

## 2022-06-15 DIAGNOSIS — I468 Cardiac arrest due to other underlying condition: Secondary | ICD-10-CM | POA: Diagnosis not present

## 2022-06-15 DIAGNOSIS — I4819 Other persistent atrial fibrillation: Secondary | ICD-10-CM | POA: Diagnosis not present

## 2022-06-15 DIAGNOSIS — R059 Cough, unspecified: Secondary | ICD-10-CM | POA: Diagnosis not present

## 2022-06-15 DIAGNOSIS — J440 Chronic obstructive pulmonary disease with acute lower respiratory infection: Secondary | ICD-10-CM | POA: Diagnosis present

## 2022-06-15 DIAGNOSIS — Z8249 Family history of ischemic heart disease and other diseases of the circulatory system: Secondary | ICD-10-CM

## 2022-06-15 DIAGNOSIS — R5381 Other malaise: Secondary | ICD-10-CM | POA: Diagnosis present

## 2022-06-15 DIAGNOSIS — J9601 Acute respiratory failure with hypoxia: Secondary | ICD-10-CM | POA: Diagnosis not present

## 2022-06-15 DIAGNOSIS — R000 Tachycardia, unspecified: Secondary | ICD-10-CM | POA: Diagnosis not present

## 2022-06-15 DIAGNOSIS — I499 Cardiac arrhythmia, unspecified: Secondary | ICD-10-CM | POA: Diagnosis not present

## 2022-06-15 DIAGNOSIS — Z7189 Other specified counseling: Secondary | ICD-10-CM | POA: Diagnosis not present

## 2022-06-15 DIAGNOSIS — I4891 Unspecified atrial fibrillation: Secondary | ICD-10-CM | POA: Diagnosis not present

## 2022-06-15 DIAGNOSIS — Z781 Physical restraint status: Secondary | ICD-10-CM

## 2022-06-15 HISTORY — DX: Other persistent atrial fibrillation: I48.19

## 2022-06-15 LAB — COMPREHENSIVE METABOLIC PANEL
ALT: 10 U/L (ref 0–44)
AST: 21 U/L (ref 15–41)
Albumin: 2.5 g/dL — ABNORMAL LOW (ref 3.5–5.0)
Alkaline Phosphatase: 67 U/L (ref 38–126)
Anion gap: 20 — ABNORMAL HIGH (ref 5–15)
BUN: 13 mg/dL (ref 8–23)
CO2: 17 mmol/L — ABNORMAL LOW (ref 22–32)
Calcium: 8.8 mg/dL — ABNORMAL LOW (ref 8.9–10.3)
Chloride: 106 mmol/L (ref 98–111)
Creatinine, Ser: 1.51 mg/dL — ABNORMAL HIGH (ref 0.61–1.24)
GFR, Estimated: 45 mL/min — ABNORMAL LOW (ref >60)
Glucose, Bld: 128 mg/dL — ABNORMAL HIGH (ref 70–99)
Potassium: 3.9 mmol/L (ref 3.5–5.1)
Sodium: 143 mmol/L (ref 135–145)
Total Bilirubin: 1.4 mg/dL — ABNORMAL HIGH (ref 0.3–1.2)
Total Protein: 6.5 g/dL (ref 6.5–8.1)

## 2022-06-15 LAB — SARS CORONAVIRUS 2 BY RT PCR: SARS Coronavirus 2 by RT PCR: NEGATIVE

## 2022-06-15 LAB — CBC WITH DIFFERENTIAL/PLATELET
Abs Immature Granulocytes: 0.04 10*3/uL (ref 0.00–0.07)
Basophils Absolute: 0 10*3/uL (ref 0.0–0.1)
Basophils Relative: 1 %
Eosinophils Absolute: 0 10*3/uL (ref 0.0–0.5)
Eosinophils Relative: 0 %
HCT: 37 % — ABNORMAL LOW (ref 39.0–52.0)
Hemoglobin: 10.6 g/dL — ABNORMAL LOW (ref 13.0–17.0)
Immature Granulocytes: 1 %
Lymphocytes Relative: 15 %
Lymphs Abs: 1.1 10*3/uL (ref 0.7–4.0)
MCH: 26.4 pg (ref 26.0–34.0)
MCHC: 28.6 g/dL — ABNORMAL LOW (ref 30.0–36.0)
MCV: 92.3 fL (ref 80.0–100.0)
Monocytes Absolute: 0.6 10*3/uL (ref 0.1–1.0)
Monocytes Relative: 8 %
Neutro Abs: 5.8 10*3/uL (ref 1.7–7.7)
Neutrophils Relative %: 75 %
Platelets: 350 10*3/uL (ref 150–400)
RBC: 4.01 MIL/uL — ABNORMAL LOW (ref 4.22–5.81)
RDW: 15.3 % (ref 11.5–15.5)
WBC: 7.6 10*3/uL (ref 4.0–10.5)
nRBC: 0 % (ref 0.0–0.2)

## 2022-06-15 LAB — I-STAT CHEM 8, ED
BUN: 14 mg/dL (ref 8–23)
Calcium, Ion: 0.92 mmol/L — ABNORMAL LOW (ref 1.15–1.40)
Chloride: 110 mmol/L (ref 98–111)
Creatinine, Ser: 1.2 mg/dL (ref 0.61–1.24)
Glucose, Bld: 124 mg/dL — ABNORMAL HIGH (ref 70–99)
HCT: 36 % — ABNORMAL LOW (ref 39.0–52.0)
Hemoglobin: 12.2 g/dL — ABNORMAL LOW (ref 13.0–17.0)
Potassium: 3.9 mmol/L (ref 3.5–5.1)
Sodium: 142 mmol/L (ref 135–145)
TCO2: 19 mmol/L — ABNORMAL LOW (ref 22–32)

## 2022-06-15 LAB — MAGNESIUM: Magnesium: 1.9 mg/dL (ref 1.7–2.4)

## 2022-06-15 LAB — BRAIN NATRIURETIC PEPTIDE: B Natriuretic Peptide: 488.2 pg/mL — ABNORMAL HIGH (ref 0.0–100.0)

## 2022-06-15 LAB — LACTIC ACID, PLASMA: Lactic Acid, Venous: 4 mmol/L (ref 0.5–1.9)

## 2022-06-15 MED ORDER — LACTATED RINGERS IV BOLUS (SEPSIS)
1000.0000 mL | Freq: Once | INTRAVENOUS | Status: AC
  Administered 2022-06-15: 1000 mL via INTRAVENOUS

## 2022-06-15 MED ORDER — POTASSIUM CHLORIDE CRYS ER 20 MEQ PO TBCR
40.0000 meq | EXTENDED_RELEASE_TABLET | Freq: Once | ORAL | Status: AC
  Administered 2022-06-15: 40 meq via ORAL
  Filled 2022-06-15: qty 2

## 2022-06-15 MED ORDER — SODIUM CHLORIDE 0.9 % IV SOLN
500.0000 mg | INTRAVENOUS | Status: DC
  Administered 2022-06-15 – 2022-06-16 (×2): 500 mg via INTRAVENOUS
  Filled 2022-06-15 (×3): qty 5

## 2022-06-15 MED ORDER — DILTIAZEM HCL-DEXTROSE 125-5 MG/125ML-% IV SOLN (PREMIX)
5.0000 mg/h | INTRAVENOUS | Status: DC
  Administered 2022-06-15: 5 mg/h via INTRAVENOUS
  Administered 2022-06-16 (×2): 10 mg/h via INTRAVENOUS
  Filled 2022-06-15 (×3): qty 125

## 2022-06-15 MED ORDER — ACETAMINOPHEN 650 MG RE SUPP: 650.0000 mg | Freq: Four times a day (QID) | RECTAL | Status: DC | PRN

## 2022-06-15 MED ORDER — LEVALBUTEROL HCL 1.25 MG/0.5ML IN NEBU
1.2500 mg | INHALATION_SOLUTION | RESPIRATORY_TRACT | Status: DC | PRN
  Administered 2022-06-16 – 2022-06-17 (×3): 1.25 mg via RESPIRATORY_TRACT
  Filled 2022-06-15 (×3): qty 0.5

## 2022-06-15 MED ORDER — LACTATED RINGERS IV SOLN: INTRAVENOUS | Status: AC

## 2022-06-15 MED ORDER — SODIUM CHLORIDE 0.9 % IV SOLN
2.0000 g | INTRAVENOUS | Status: AC
  Administered 2022-06-15 – 2022-06-18 (×4): 2 g via INTRAVENOUS
  Filled 2022-06-15 (×5): qty 20

## 2022-06-15 MED ORDER — MAGNESIUM SULFATE 2 GM/50ML IV SOLN
2.0000 g | Freq: Once | INTRAVENOUS | Status: AC
  Administered 2022-06-15: 2 g via INTRAVENOUS
  Filled 2022-06-15: qty 50

## 2022-06-15 MED ORDER — ACETAMINOPHEN 325 MG PO TABS: 650.0000 mg | ORAL_TABLET | Freq: Four times a day (QID) | ORAL | Status: DC | PRN

## 2022-06-15 NOTE — Sepsis Progress Note (Signed)
Elink following code sepsis °

## 2022-06-15 NOTE — ED Provider Notes (Signed)
[facility] EMERGENCY DEPARTMENT Provider Note   CSN: [id number] Arrival date & time: 06/28/2022  1946     History  Chief Complaint  Patient presents with   Shortness of Breath   Atrial Fibrillation    Robert Hamilton is a 88 y.o. male.  The history is provided by the patient, medical records and the EMS personnel. No language interpreter was used.  Shortness of Breath Severity:  Moderate Onset quality:  Gradual Duration:  2 days Timing:  Constant Progression:  Waxing and waning Chronicity:  Recurrent Context: URI   Relieved by:  Nothing Worsened by:  Coughing and exertion Ineffective treatments:  None tried Associated symptoms: cough, fever and sputum production   Associated symptoms: no abdominal pain, no chest pain, no diaphoresis, no headaches, no neck pain, no vomiting and no wheezing   Risk factors: hx of cancer and hx of PE/DVT   Atrial Fibrillation This is a chronic problem. The problem occurs constantly. The problem has not changed since onset.Associated symptoms include shortness of breath. Pertinent negatives include no chest pain, no abdominal pain and no headaches. Nothing aggravates the symptoms. Nothing relieves the symptoms. He has tried nothing for the symptoms.       Home Medications Prior to Admission medications   Medication Sig Start Date End Date Taking? Authorizing Provider  acetaminophen (TYLENOL) 325 MG tablet Take 2 tablets (650 mg total) by mouth every 6 (six) hours as needed for mild pain or fever (or Fever >/= 101). Patient not taking: Reported on 05/30/2022 06/28/21   Barb Merino, MD  albuterol (VENTOLIN HFA) 108 (90 Base) MCG/ACT inhaler 2 puffs every 6 (six) hours as needed. Shortness of breath Patient not taking: Reported on 04/24/2022 06/05/21   [provider]  allopurinol (ZYLOPRIM) 100 MG tablet Take 100 mg by mouth every morning. Patient not taking: Reported on 05/30/2022 04/26/21   [provider]   Calcium Carb-Cholecalciferol (CALCIUM 600-D PO) Take 1 tablet by mouth every morning. Patient not taking: Reported on 05/30/2022    [provider]  dexamethasone (DECADRON) 4 MG tablet One tab po TID Patient not taking: Reported on 05/30/2022 01/18/22   [name], MD  ENTRESTO 24-26 MG TAKE 1 TABLET BY MOUTH TWICE DAILY Patient not taking: Reported on 05/30/2022 05/06/22   [name], MD  folic acid (FOLVITE) 1 MG tablet Take 1 mg by mouth every morning. Patient not taking: Reported on 05/30/2022 02/23/19   [provider]  metoprolol succinate (TOPROL-XL) 25 MG 24 hr tablet TAKE 1/2 TABLET(12.5 MG) BY MOUTH DAILY Patient not taking: Reported on 05/30/2022 03/18/22   [name], MD  OXYGEN Inhale into the lungs as needed (shortness of breath). Patient not taking: Reported on 05/30/2022    [provider]  pantoprazole (PROTONIX) 40 MG tablet Take 40 mg by mouth every morning. Patient not taking: Reported on 05/30/2022 07/16/19   [provider]  thiamine (VITAMIN B-1) 100 MG tablet Take 100 mg by mouth every morning. Patient not taking: Reported on 05/30/2022    [provider]      Allergies    Paclitaxel    Review of Systems   Review of Systems  Constitutional:  Positive for chills, fatigue and fever. Negative for diaphoresis.  HENT:  Positive for congestion.   Respiratory:  Positive for cough, sputum production and shortness of breath. Negative for chest tightness, wheezing and stridor.   Cardiovascular:  Negative for chest pain, palpitations and leg  swelling.  Gastrointestinal:  Negative for abdominal pain, constipation, diarrhea, nausea and vomiting.  Genitourinary:  Negative for dysuria and flank pain.  Musculoskeletal:  Negative for back pain, neck pain and neck stiffness.  Skin:  Negative for wound.  Neurological:  Negative for light-headedness and headaches.  Psychiatric/Behavioral:  Negative for agitation and  confusion.   All other systems reviewed and are negative.   Physical Exam Updated Vital Signs BP (!) 127/101   Pulse (!) 59   Temp (!) 97.5 F (36.4 C) (Oral)   Resp (!) 26   SpO2 99%  Physical Exam Vitals and nursing note reviewed.  Constitutional:      General: He is not in acute distress.    Appearance: He is well-developed. He is not ill-appearing, toxic-appearing or diaphoretic.  HENT:     Head: Normocephalic and atraumatic.     Mouth/Throat:     Mouth: Mucous membranes are moist.  Eyes:     Conjunctiva/sclera: Conjunctivae normal.     Pupils: Pupils are equal, round, and reactive to light.  Cardiovascular:     Rate and Rhythm: Tachycardia present. Rhythm irregular.     Heart sounds: No murmur heard. Pulmonary:     Effort: Pulmonary effort is normal. Tachypnea present. No respiratory distress.     Breath sounds: Rhonchi present. No wheezing or rales.  Chest:     Chest wall: No tenderness.  Abdominal:     Palpations: Abdomen is soft.     Tenderness: There is no abdominal tenderness.  Musculoskeletal:        General: No swelling.     Cervical back: Neck supple.     Right lower leg: No tenderness. No edema.     Left lower leg: No tenderness. No edema.  Skin:    General: Skin is warm and dry.     Capillary Refill: Capillary refill takes less than 2 seconds.     Findings: No erythema.  Neurological:     General: No focal deficit present.     Mental Status: He is alert.  Psychiatric:        Mood and Affect: Mood normal.     ED Results / Procedures / Treatments   Labs (all labs ordered are listed, but only abnormal results are displayed) Labs Reviewed  CBC WITH DIFFERENTIAL/PLATELET - Abnormal; Notable for the following components:      Result Value   RBC 4.01 (*)    Hemoglobin 10.6 (*)    HCT 37.0 (*)    MCHC 28.6 (*)    All other components within normal limits  COMPREHENSIVE METABOLIC PANEL - Abnormal; Notable for the following components:   CO2 17 (*)     Glucose, Bld 128 (*)    Creatinine, Ser 1.51 (*)    Calcium 8.8 (*)    Albumin 2.5 (*)    Total Bilirubin 1.4 (*)    GFR, Estimated 45 (*)    Anion gap 20 (*)    All other components within normal limits  LACTIC ACID, PLASMA - Abnormal; Notable for the following components:   Lactic Acid, Venous 4.0 (*)    All other components within normal limits  BRAIN NATRIURETIC PEPTIDE - Abnormal; Notable for the following components:   B Natriuretic Peptide 488.2 (*)    All other components within normal limits  I-STAT CHEM 8, ED - Abnormal; Notable for the following components:   Glucose, Bld 124 (*)    Calcium, Ion 0.92 (*)    TCO2 19 (*)  Hemoglobin 12.2 (*)    HCT 36.0 (*)    All other components within normal limits  SARS CORONAVIRUS 2 BY RT PCR  CULTURE, BLOOD (ROUTINE X 2)  CULTURE, BLOOD (ROUTINE X 2)  MAGNESIUM  LACTIC ACID, PLASMA  URINALYSIS, ROUTINE W REFLEX MICROSCOPIC  CBC WITH DIFFERENTIAL/PLATELET  COMPREHENSIVE METABOLIC PANEL  MAGNESIUM    EKG EKG Interpretation  Date/Time:  Saturday June 15 2022 19:55:00 EDT Ventricular Rate:  122 PR Interval:    QRS Duration: 131 QT Interval:  352 QTC Calculation: 502 R Axis:   -3 Text Interpretation: Atrial fibrillation Left bundle branch block when compared to prior now afib with RVR. similar LBBB. No STEMI Confirmed by Antony Blackbird [phone]) on 06/20/2022 8:01:08 PM  Radiology DG Chest Portable 1 View  Result Date: 06/05/2022 CLINICAL DATA:  Shortness of breath.  Atrial fibrillation.  Cough. EXAM: PORTABLE CHEST 1 VIEW COMPARISON:  January 15, 2022 FINDINGS: New left perihilar opacity. New opacity in the right base. Stable cardiomegaly. The mediastinum is unchanged. No pneumothorax. Bullets project over the chest. Stable right Port-A-Cath. No other acute abnormalities. IMPRESSION: Increased opacity in the right base in the left perihilar region worrisome for pneumonia or aspiration. Recommend short-term follow-up  imaging to ensure resolution. No other acute abnormalities. Electronically Signed   By: Dorise Bullion III M.D.   On: 06/24/2022 20:23    Procedures Procedures    CRITICAL CARE Performed by: Gwenyth Allegra Amram Maya Total critical care time: 35 minutes Critical care time was exclusive of separately billable procedures and treating other patients. Critical care was necessary to treat or prevent imminent or life-threatening deterioration. Critical care was time spent personally by me on the following activities: development of treatment plan with patient and/or surrogate as well as nursing, discussions with consultants, evaluation of patient's response to treatment, examination of patient, obtaining history from patient or surrogate, ordering and performing treatments and interventions, ordering and review of laboratory studies, ordering and review of radiographic studies, pulse oximetry and re-evaluation of patient's condition.   Medications Ordered in ED Medications  diltiazem (CARDIZEM) 125 mg in dextrose 5% 125 mL (1 mg/mL) infusion (10 mg/hr Intravenous Rate/Dose Change 06/13/2022 2213)  lactated ringers infusion ( Intravenous New Bag/Given 06/26/2022 2211)  lactated ringers bolus 1,000 mL (1,000 mLs Intravenous New Bag/Given 06/08/2022 2211)    And  lactated ringers bolus 1,000 mL (has no administration in time range)  cefTRIAXone (ROCEPHIN) 2 g in sodium chloride 0.9 % 100 mL IVPB (2 g Intravenous New Bag/Given 06/18/2022 2212)  azithromycin (ZITHROMAX) 500 mg in sodium chloride 0.9 % 250 mL IVPB (has no administration in time range)  acetaminophen (TYLENOL) tablet 650 mg (has no administration in time range)    Or  acetaminophen (TYLENOL) suppository 650 mg (has no administration in time range)    ED Course/ Medical Decision Making/ A&P                           Medical Decision Making Amount and/or Complexity of Data Reviewed Labs: ordered. Radiology: ordered.  Risk Prescription drug  management. Decision regarding hospitalization.    Bertha Earwood is a 88 y.o. male past medical history significant for lung cancer with documented metastasis to brain and bone, CKD, prior diverticulitis, previous A-fib and V. tach, previous pulmonary embolism, acute GI bleeding with anemia, COPD, and CHF who presents for chills, cough, shortness of breath, and fatigue.  According to patient, he started having some chills  and a cough today and coughing up some phlegm.  He denies any chest pain but does report some shortness of breath especially with exertion.  He denies any palpitations or syncope but does feel fatigued and lightheaded.  Denies any specific headache, neck pain, or abdominal pain.  Denies back pains.  No discomfort at all right now just feeling tired.  On arrival, patient was found to be in A-fib with RVR with a rate in the 130s on arrival.  He is not hypotensive but is tachypneic.  Is also warm to the touch.  Rectal temperature was 99.  On exam, he does have significant rhonchi in all lung fields.  Chest and abdomen are nontender.  Back is nontender.  No focal neurologic deficits.  Neck nontender.  Given the patient's chills, cough, and vital signs I am somewhat concerned that patient has recurrent A-fib with RVR in the setting of infection or even pneumonia.  We will get chest x-ray and labs to further evaluate.  Given his lack of any pain, have less suspicion for a PE cause of symptoms at this time.  Chart review showed that he had a PE study several months ago that did not show evidence of pulmonary embolism.  Patient had work-up started and he was started on some diltiazem to slow his heart rate.  Heart rate is now around 100 and he is resting.  He is on 4 L nasal cannula.  His work-up started to return and although he does not have a white count and has mild anemia, his lactic acid is elevated at 4.0.  His metabolic panel shows increased creatinine from prior.  BNP is elevated  but not as high as has been in the past.  Magnesium is normal.  Chest x-ray shows evidence of acute pneumonia.  Given the patient's tachycardia, tachypnea, and now concern for a pneumonia source, we will give antibiotics and he will need admission.  He is on a dill drip for his heart rate as well.  Clinically he does not look significantly fluid overloaded so given his lactic acid returned at 4, we will make him a code sepsis and treat for pneumonia.  We will call for admission.        Final Clinical Impression(s) / ED Diagnoses Final diagnoses:  Atrial fibrillation with RVR (HCC)  Shortness of breath  Community acquired pneumonia, unspecified laterality    Clinical Impression: 1. Atrial fibrillation with RVR ([place])   2. Shortness of breath   3. Community acquired pneumonia, unspecified laterality     Disposition: Admit  This note was prepared with assistance of Systems analyst. Occasional wrong-word or sound-a-like substitutions may have occurred due to the inherent limitations of voice recognition software.     Eve Rey, Gwenyth Allegra, MD 06/30/2022 2226

## 2022-06-15 NOTE — ED Notes (Signed)
Pt rectal temp 99.6

## 2022-06-16 DIAGNOSIS — I4891 Unspecified atrial fibrillation: Secondary | ICD-10-CM | POA: Diagnosis not present

## 2022-06-16 DIAGNOSIS — J9601 Acute respiratory failure with hypoxia: Secondary | ICD-10-CM | POA: Diagnosis present

## 2022-06-16 DIAGNOSIS — E872 Acidosis, unspecified: Secondary | ICD-10-CM | POA: Diagnosis present

## 2022-06-16 LAB — MAGNESIUM: Magnesium: 2.2 mg/dL (ref 1.7–2.4)

## 2022-06-16 LAB — URINALYSIS, ROUTINE W REFLEX MICROSCOPIC
Bacteria, UA: NONE SEEN
Bilirubin Urine: NEGATIVE
Glucose, UA: NEGATIVE mg/dL
Ketones, ur: 5 mg/dL — AB
Nitrite: NEGATIVE
Protein, ur: 30 mg/dL — AB
RBC / HPF: >50 RBC/hpf — ABNORMAL HIGH (ref 0–5)
Specific Gravity, Urine: 1.024 (ref 1.005–1.030)
WBC, UA: >50 WBC/hpf — ABNORMAL HIGH (ref 0–5)
pH: 5 (ref 5.0–8.0)

## 2022-06-16 LAB — CBC WITH DIFFERENTIAL/PLATELET
Abs Immature Granulocytes: 0.03 10*3/uL (ref 0.00–0.07)
Basophils Absolute: 0 10*3/uL (ref 0.0–0.1)
Basophils Relative: 0 %
Eosinophils Absolute: 0 10*3/uL (ref 0.0–0.5)
Eosinophils Relative: 0 %
HCT: 32.2 % — ABNORMAL LOW (ref 39.0–52.0)
Hemoglobin: 9.3 g/dL — ABNORMAL LOW (ref 13.0–17.0)
Immature Granulocytes: 0 %
Lymphocytes Relative: 17 %
Lymphs Abs: 1.2 10*3/uL (ref 0.7–4.0)
MCH: 26.1 pg (ref 26.0–34.0)
MCHC: 28.9 g/dL — ABNORMAL LOW (ref 30.0–36.0)
MCV: 90.4 fL (ref 80.0–100.0)
Monocytes Absolute: 0.7 10*3/uL (ref 0.1–1.0)
Monocytes Relative: 9 %
Neutro Abs: 5.4 10*3/uL (ref 1.7–7.7)
Neutrophils Relative %: 74 %
Platelets: 345 10*3/uL (ref 150–400)
RBC: 3.56 MIL/uL — ABNORMAL LOW (ref 4.22–5.81)
RDW: 15.1 % (ref 11.5–15.5)
WBC: 7.4 10*3/uL (ref 4.0–10.5)
nRBC: 0 % (ref 0.0–0.2)

## 2022-06-16 LAB — CREATININE, URINE, RANDOM: Creatinine, Urine: 286 mg/dL

## 2022-06-16 LAB — COMPREHENSIVE METABOLIC PANEL
ALT: 8 U/L (ref 0–44)
AST: 19 U/L (ref 15–41)
Albumin: 2.3 g/dL — ABNORMAL LOW (ref 3.5–5.0)
Alkaline Phosphatase: 70 U/L (ref 38–126)
Anion gap: 14 (ref 5–15)
BUN: 11 mg/dL (ref 8–23)
CO2: 18 mmol/L — ABNORMAL LOW (ref 22–32)
Calcium: 8.7 mg/dL — ABNORMAL LOW (ref 8.9–10.3)
Chloride: 109 mmol/L (ref 98–111)
Creatinine, Ser: 1.24 mg/dL (ref 0.61–1.24)
GFR, Estimated: 57 mL/min — ABNORMAL LOW (ref >60)
Glucose, Bld: 106 mg/dL — ABNORMAL HIGH (ref 70–99)
Potassium: 3.8 mmol/L (ref 3.5–5.1)
Sodium: 141 mmol/L (ref 135–145)
Total Bilirubin: 0.7 mg/dL (ref 0.3–1.2)
Total Protein: 6 g/dL — ABNORMAL LOW (ref 6.5–8.1)

## 2022-06-16 LAB — BLOOD GAS, VENOUS
Acid-base deficit: 4 mmol/L — ABNORMAL HIGH (ref 0.0–2.0)
Bicarbonate: 19.6 mmol/L — ABNORMAL LOW (ref 20.0–28.0)
Drawn by: 8419
O2 Saturation: 77.2 %
Patient temperature: 37
pCO2, Ven: 31 mmHg — ABNORMAL LOW (ref 44–60)
pH, Ven: 7.41 (ref 7.25–7.43)
pO2, Ven: 52 mmHg — ABNORMAL HIGH (ref 32–45)

## 2022-06-16 LAB — PHOSPHORUS: Phosphorus: 3 mg/dL (ref 2.5–4.6)

## 2022-06-16 LAB — SODIUM, URINE, RANDOM: Sodium, Ur: 58 mmol/L

## 2022-06-16 LAB — SALICYLATE LEVEL: Salicylate Lvl: <7 mg/dL — ABNORMAL LOW (ref 7.0–30.0)

## 2022-06-16 LAB — PROTIME-INR
INR: 1.3 — ABNORMAL HIGH (ref 0.8–1.2)
Prothrombin Time: 15.7 seconds — ABNORMAL HIGH (ref 11.4–15.2)

## 2022-06-16 LAB — STREP PNEUMONIAE URINARY ANTIGEN: Strep Pneumo Urinary Antigen: NEGATIVE

## 2022-06-16 LAB — MRSA NEXT GEN BY PCR, NASAL: MRSA by PCR Next Gen: NOT DETECTED

## 2022-06-16 LAB — LACTIC ACID, PLASMA
Lactic Acid, Venous: 2.3 mmol/L (ref 0.5–1.9)
Lactic Acid, Venous: 2.6 mmol/L (ref 0.5–1.9)

## 2022-06-16 LAB — PROCALCITONIN: Procalcitonin: 0.54 ng/mL

## 2022-06-16 NOTE — H&P (Signed)
She is History and Physical    PLEASE NOTE THAT DRAGON DI[employer] SOFTWARE WAS USED IN THE CONSTRUCTION OF THIS NOTE.   Robert Hamilton WRU:[id number] DOB: 1933/12/10 DOA: 06/24/2022  PCP: Maryella Shivers, MD  Patient coming from: home   I have personally briefly reviewed patient's old medical records in [place]  Chief Complaint: Cough  HPI: Robert Hamilton is a 88 y.o. male with medical history significant for worsening metastatic disease to the brain, paroxysmal atrial fibrillation not anticoagulated in the setting of history of gastrointestinal bleed, chronic systolic/diastolic heart failure, who is admitted to [facility] on 06/11/2022 with atrial fibrillation with RVR after presenting from home to [facility] ED complaining of cough.   The patient reports to 3 days of progressive nonproductive cough associated with chills in the absence of full body rigors and in the absence of generalized myalgias.  He notes mild associated shortness of breath in the absence of any orthopnea, PND, or worsening peripheral edema over that timeframe.  Also denies any associated chest pain, palpitations, diaphoresis, dizziness, presyncope, or syncope.  Has history of chronic systolic/diastolic heart failure, with most recent echocardiogram in May 2022 notable for LVEF 35 to 91%, grade 2 diastolic dysfunction, severely dilated left atrium, moderate to severe mitral regurgitation and moderate to severe tricuspid cuspid regurgitation.  He also has a history of paroxysmal atrial fibrillation, but not anticoagulated in the setting of history of gastrointestinal bleed.  He has a distant history of acute pulmonary embolism, but the patient reporting that the constellation of symptoms with which he presents this evening are much different than having with which he had presented at the time of his prior pulmonary embolism.  He reports no baseline supplemental oxygen requirements.  Of note, the patient was  recently hospitalized in May 2023 for acute lower gastrointestinal bleed in the setting of bleeding hemorrhoid.  Per chart review, he has a baseline creatinine range of 0.9-1.2, with most recent prior value noted to be 1.24 on 04/24/2022.  Of note, patient also has history squamous cell carcinoma of the left lung with documentation of a history of metastatic disease to the brain.  Follows with Dr. Mickeal Skinner as his outpatient oncologist.    ED Course:  Vital signs in the ED were notable for the following: Afebrile; presented in atrial fibrillation with RVR, initial heart rates in the 130s to 140s, subsequently improving into the low 100s following initiation of diltiazem drip; normotensive blood pressures; respiratory rate 19-26, initial oxygen saturations in the mid 80s on room air, socially improving into the mid to high 90s on 4 L nasal cannula.  Labs were notable for the following: CMP notable for the following: Potassium 3.9 creatinine 1.51.  BNP 488 compared to 760 on day of hospitalization in February 2023.  CBC notable for white blood cell count 7600 with 75% neutrophils, hemoglobin 10.6.  Initial lactate 4.0.  Urinalysis ordered, with result currently pending.  Blood cultures x2 collected prior to initiation of IV antibiotics.  COVID-19 PCR negative.  Imaging and additional notable ED work-up: EKG, in comparison to most recent prior from March 2023 shows atrial fibrillation with RVR, rate 122, left bundle branch block, also noted on most recent prior EKG, nonspecific T wave inversion in aVL, no evidence of ST changes, including no evidence of interval ST elevation.  Chest x-ray, in comparison to CXR from 01/15/2022 shows interval development of airspace opacities in the right lung base as well as the left perihilar region,  concerning for pneumonia in the absence of any evidence of edema, effusion, or pneumothorax.  While in the ED, the following were administered: Azithromycin, Rocephin, diltiazem  drip, lactated Ringer's x2 Kirbi Farrugia bolus followed by continuous LR at 150 cc/h.  Subsequently, the patient was admitted for further evaluation management of atrial fibrillation with RVR in the setting of suspected pneumonia complicated by acute hypoxic respiratory failure, with presenting labs also notable for lactic acidosis and acute kidney injury.    Review of Systems: As per HPI otherwise 10 point review of systems negative.   Past Medical History:  Diagnosis Date   Anemia    Anemia 09/27/2021   Cancer ([place])    squamous cell cancer of left lung, with mets to brain   CHF (congestive heart failure) (HCC)    Chronic kidney disease    COPD (chronic obstructive pulmonary disease) (HCC)    Diverticulosis    GERD (gastroesophageal reflux disease)    Hypertension    Metastasis to brain ([place]) 03/20/2022   Persistent atrial fibrillation [facility])     Past Surgical History:  Procedure Laterality Date   CRYOTHERAPY  06/26/2021   Procedure: CRYOTHERAPY;  Surgeon: Garner Nash, DO;  Location: [place];  Service: Pulmonary;;   HEMOSTASIS CONTROL  06/26/2021   Procedure: HEMOSTASIS CONTROL;  Surgeon: Garner Nash, DO;  Location: [place] [facility];  Service: Pulmonary;;   HERNIA REPAIR     PROSTATE SURGERY     PROSTATECTOMY     VIDEO BRONCHOSCOPY Left 06/26/2021   Procedure: VIDEO BRONCHOSCOPY WITHOUT FLUORO;  Surgeon: Garner Nash, DO;  Location: [facility];  Service: Pulmonary;  Laterality: Left;  possible cryotherapy    Social History:  reports that he quit smoking about 18 months ago. His smoking use included cigarettes. He smoked an average of .25 packs per day. He has never used smokeless tobacco. He reports that he does not currently use alcohol. He reports that he does not use drugs.   Allergies  Allergen Reactions   Paclitaxel Shortness Of Breath    Family History  Problem Relation Age of Onset   Heart attack Mother    Hypertension Mother    Hypertension Father     Hypertension Sister    Hypertension Sister    Hypertension Sister    Hypertension Sister     Family history reviewed and not pertinent    Prior to Admission medications   Medication Sig Start Date End Date Taking? Authorizing Provider  acetaminophen (TYLENOL) 325 MG tablet Take 2 tablets (650 mg total) by mouth every 6 (six) hours as needed for mild pain or fever (or Fever >/= 101). Patient not taking: Reported on 05/30/2022 06/28/21   Barb Merino, MD  albuterol (VENTOLIN HFA) 108 (90 Base) MCG/ACT inhaler 2 puffs every 6 (six) hours as needed. Shortness of breath Patient not taking: Reported on 04/24/2022 06/05/21   [provider]  allopurinol (ZYLOPRIM) 100 MG tablet Take 100 mg by mouth every morning. Patient not taking: Reported on 05/30/2022 04/26/21   [provider]  dexamethasone (DECADRON) 4 MG tablet One tab po TID Patient not taking: Reported on 05/30/2022 01/18/22   [name], MD  ENTRESTO 24-26 MG TAKE 1 TABLET BY MOUTH TWICE DAILY Patient not taking: Reported on 05/30/2022 05/06/22   [name], MD  metoprolol succinate (TOPROL-XL) 25 MG 24 hr tablet TAKE 1/2 TABLET(12.5 MG) BY MOUTH DAILY Patient not taking: Reported on 05/30/2022 03/18/22   [name], MD  OXYGEN  Inhale into the lungs as needed (shortness of breath). Patient not taking: Reported on 05/30/2022    [provider]     Objective    Physical Exam: Vitals:   06/04/2022 2345 06/16/22 0100 06/16/22 0230 06/16/22 0505  BP: 121/75 130/75 133/82 112/70  Pulse:    77  Resp:  (!) 22 (!) 29 20  Temp:   (!) 97.3 F (36.3 C) 97.7 F (36.5 C)  TempSrc:   Oral Oral  SpO2:    99%    General: appears to be stated age; alert, oriented Skin: warm, dry, no rash Head:  AT/[place] Mouth:  Oral mucosa membranes appear moist, normal dentition Neck: supple; trachea midline Heart: Tachycardic, regular; did not appreciate any M/R/G Lungs: CTAB, did not appreciate any  wheezes, rales, or rhonchi Abdomen: + BS; soft, ND, NT Vascular: 2+ pedal pulses b/l; 2+ radial pulses b/l Extremities: no peripheral edema, no muscle wasting Neuro: strength and sensation intact in upper and lower extremities b/l    Labs on Admission: I have personally reviewed following labs and imaging studies  CBC: Recent Labs  Lab 06/29/2022 2000 06/01/2022 2014 06/16/22 0459  WBC 7.6  --  7.4  NEUTROABS 5.8  --  5.4  HGB 10.6* 12.2* 9.3*  HCT 37.0* 36.0* 32.2*  MCV 92.3  --  90.4  PLT 350  --  446   Basic Metabolic Panel: Recent Labs  Lab 06/29/2022 2000 06/10/2022 2014 06/16/22 0459  NA 143 142 141  K 3.9 3.9 3.8  CL 106 110 109  CO2 17*  --  18*  GLUCOSE 128* 124* 106*  BUN _0 CREATININE 1.51* 1.20 1.24  CALCIUM 8.8*  --  8.7*  MG 1.9  --  2.2  PHOS  --   --  3.0   GFR: CrCl cannot be calculated (Unknown ideal weight.). Liver Function Tests: Recent Labs  Lab 06/03/2022 2000 06/16/22 0459  AST 21 19  ALT 10 8  ALKPHOS 67 70  BILITOT 1.4* 0.7  PROT 6.5 6.0*  ALBUMIN 2.5* 2.3*   No results for input(s): "LIPASE", "AMYLASE" in the last 168 hours. No results for input(s): "AMMONIA" in the last 168 hours. Coagulation Profile: Recent Labs  Lab 06/16/22 0459  INR 1.3*   Cardiac Enzymes: No results for input(s): "CKTOTAL", "CKMB", "CKMBINDEX", "TROPONINI" in the last 168 hours. BNP (last 3 results) No results for input(s): "PROBNP" in the last 8760 hours. HbA1C: No results for input(s): "HGBA1C" in the last 72 hours. CBG: No results for input(s): "GLUCAP" in the last 168 hours. Lipid Profile: No results for input(s): "CHOL", "HDL", "LDLCALC", "TRIG", "CHOLHDL", "LDLDIRECT" in the last 72 hours. Thyroid Function Tests: No results for input(s): "TSH", "T4TOTAL", "FREET4", "T3FREE", "THYROIDAB" in the last 72 hours. Anemia Panel: No results for input(s): "VITAMINB12", "FOLATE", "FERRITIN", "TIBC", "IRON", "RETICCTPCT" in the last 72 hours. Urine  analysis:    Component Value Date/Time   COLORURINE AMBER (A) 06/16/2022 0141   APPEARANCEUR CLOUDY (A) 06/16/2022 0141   LABSPEC 1.024 06/16/2022 0141   PHURINE 5.0 06/16/2022 0141   GLUCOSEU NEGATIVE 06/16/2022 0141   HGBUR MODERATE (A) 06/16/2022 0141   BILIRUBINUR NEGATIVE 06/16/2022 0141   KETONESUR 5 (A) 06/16/2022 0141   PROTEINUR 30 (A) 06/16/2022 0141   NITRITE NEGATIVE 06/16/2022 0141   LEUKOCYTESUR MODERATE (A) 06/16/2022 0141    Radiological Exams on Admission: DG Chest Portable 1 View  Result Date: 06/01/2022 CLINICAL DATA:  Shortness of breath.  Atrial fibrillation.  Cough. EXAM: PORTABLE CHEST 1 VIEW COMPARISON:  January 15, 2022 FINDINGS: New left perihilar opacity. New opacity in the right base. Stable cardiomegaly. The mediastinum is unchanged. No pneumothorax. Bullets project over the chest. Stable right Port-A-Cath. No other acute abnormalities. IMPRESSION: Increased opacity in the right base in the left perihilar region worrisome for pneumonia or aspiration. Recommend short-term follow-up imaging to ensure resolution. No other acute abnormalities. Electronically Signed   By: Dorise Bullion III M.D.   On: 06/06/2022 20:23     EKG: Independently reviewed, with result as described above.    Assessment/Plan   Principal Problem:   Atrial fibrillation with RVR (HCC) Active Problems:   AKI (acute kidney injury) ([place])   Chronic combined systolic and diastolic heart failure (HCC)   CAP (community acquired pneumonia)   Lactic acidosis   Acute respiratory failure with hypoxia (HCC)      #(atrial fibrillation with RVR: In setting of tach and history of paroxysmal atrial fibrillation, noted to be in A-fib RVR with initial heart rates in the 130s 140s, subsequently decreasing into the low 100s following initiation of diltiazem drip.  Blood pressure has tolerated both the RVR as well as ensuing initiation of diltiazem drip, without any overt hypotension.  Appears to be  on the basis of potentially multifactorial causes, including physiologic stress stemming from suspected bilateral pneumonia as well as resultant acute hypoxic respiratory failure, as further detailed below.  Of note, ACS felt to be less likely, in the absence of any recent chest pain, while presenting EKG shows no evidence of acute ischemic changes.   While he has a CHA2DS2-VASc of 5, he is not currently on anticoagulation given history of gastrointestinal bleed.  Associated increased risk for PE noted, although clinically, presentation appears less suggestive of such at this time, particular given alternate explanation for his shortness of breath, cough, acute hypoxic respiratory failure in the setting of a pneumonia, as above.   Plan: Continue diltiazem drip.  Hold home metoprolol succinate for now.  Monitor on telemetry.  Potassium chloride 40 mEq p.o. x1 dose now.  Magnesium sulfate 2 g IV over 2 hours x 1 dose now.  Further evaluation management of presenting pneumonia, as below.         #) Community-acquired pneumonia: Presents with a suspected multilobular pneumonia in the setting of 2 to 3 days of new onset cough, shortness of breath, chills, with suggestion of interval infiltrates in the right lung base as well as left perihilar region concerning for pneumonia.  In the absence of leukocytosis or objective fever, SIRS of criteria for sepsis not currently met.  In reviewing discharge summary from most recent hospitalization in May 2023 for acute lower gastrointestinal bleed, it does not appear that the patient received any IV antibiotics over the hospital course.  Consequently, we will continue with empiric IV antibiotics for community-acquired pneumonia in the form of azithromycin and Rocephin, which were initiated in the ED this evening.  Of note, blood cultures x2 were collected prior to receipt of these antibiotics and COVID-19 PCR found to be negative.  Plan: Follow-up results of blood  cultures x2.  Continue azithromycin Rocephin.  Add on procalcitonin level.  Repeat CBC with differential in the morning.  Flutter valve, incentive spirometry.  Add on strep pneumoniae urine antigen.  Check urinalysis.          #) Lactic acidosis: Presenting lactate 4.0, in the absence of formal SIRS criteria for sepsis, as further detailed above.  Appears  multifactorial nature, with contributions from generalized tissue hypoperfusion in the setting of acute hypoxic respiratory failure as well as acute kidney injury.  There may also be an element of chronicity to his elevated lactate given his history of metastatic lung cancer.   Plan: Continuous IV fluids.  Follow for repeat lactate level.  Further evaluation management of AKI, as further detailed below.  Add on salicylate level.  Check VBG, INR.  Further evaluation management of presenting acute hypoxic respiratory failure, as below.           #) Acute Kidney Injury: Presenting creatinine 1.51 compared to baseline of 0.9-1.2.  Appears prerenal in the setting of presenting infection as well as relative decline in renal perfusion as a consequence of diminished oxygen delivery capacity in the setting of acute hypoxic respiratory failure.  Notable outpatient medications include Entresto.  Plan: monitor strict I's & O's and daily weights. Attempt to avoid nephrotoxic agents.  Holding home [place] for now .refrain from NSAIDs. Repeat CMP in the morning.  Check urinalysis with microscopy.  Add-on random urine sodium and random urine creatinine.         #) Acute hypoxic respiratory failure: in the context of acute respiratory symptoms and no known baseline supplemental O2 requirements, presenting O2 sat in the mid 80s on room air, subsequent proving into the mid 90s on 4 L nasal cannula. Appears to be on basis of multifocal pneumonia, as above.  No clinical or radiographic evidence to suggest acutely decompensated failure at this  time.  In terms of other considered etiologies, ACS appears less likely at this time in the absence of any recent CP and in the context of negative troponin and presenting EKG showing no e/o acute ischemic process.  COVID PCR negative.  Acute pulmonary embolism remains in the differential, although clinically, appears less likely relative to the above, at this time..   Plan: further evaluation/management of presenting multifocal pneumonia, as above. Monitor continuous pulse ox with prn supplemental O2 to maintain O2 sats greater than or equal to 92%. monitor on telemetry. CMP/CBC in the AM. check Phos level. Check blood gas. Flutter valve, incentive spirometry.  Add on procalcitonin level. prn Xopenex nebulizer.          #) Chronic combined systolic and diastolic heart failure: documented history of such, with most recent echocardiogram performed in May 2022 notable for LVEF 35 to 40% as well as grade 2 diastolic dysfunction, with additional details as conveyed above.  No clinical or radiographic evidence to suggest acutely decompensated heart failure at this time, although the patient is at risk for ensuing development of acutely decompensated heart failure in the setting of his presenting atrial fibrillation with RVR.  At present , it does not appear that the patient is on a scheduled diuretic medications at home.  Home cardiac medications include Entresto as well as metoprolol succinate.    Plan: monitor strict I's & O's and daily weights. Repeat CMP and mag level in the morning. Will transiently hold Entresto in setting of concomitant acute kidney injury.  Holding outpatient metoprolol succinate for now while on diltiazem drip.  Monitor continuous pulse oximetry.      DVT prophylaxis: SCD's   Code Status: DNR/DNI (per documentation from most recent prior hospitalization, and confirmed per my discussions with the patient this evening) Family Communication: none Disposition Plan: Per  Rounding Team Consults called: none;  Admission status: Inpatient    PLEASE NOTE THAT DRAGON DICTATION SOFTWARE WAS USED IN THE  CONSTRUCTION OF THIS NOTE.   [place]ruff DO [facility]  From [place]   06/16/2022, 6:21 AM

## 2022-06-16 NOTE — Progress Notes (Addendum)
PROGRESS NOTE  Robert Hamilton  JGG:[id number] DOB: Jun 21, 1934 DOA: 06/09/2022 PCP: Maryella Shivers, MD   Brief Narrative: Patient is 88 year old male with history of metastatic cancer to the brain, paroxysmal A-fib not on anticoagulation in the setting of GI bleed, chronic systolic/diastolic CHF who presented from home with cough, fast heartbeat, chills, shortness of breath.  Patient was recently hospitalized in May for acute lower GI bleed.  On admission he was in A-fib with RVR and had to be started on Cardizem drip.  He was noted to be hypoxic on room air.  Lactate was 4 on presentation.  Blood cultures were collected, started on antibiotics after finding of airspace opacities in the right lung base as well as left perihilar region concerning for pneumonia.   Assessment & Plan:  Principal Problem:   Atrial fibrillation with RVR (HCC)  A-fib with RVR: Started on Cardizem drip.  Monitor on telemetry.  Not on anticoagulation due to history of GI bleed.a CHA2DS2-VASc of 5.  Also on metoprolol.  He is also not a candidate for anticoagulation due to history of metastatic lung cancer to the brain.  Sepsis/community-acquired pneumonia/lactic acidosis: Presented with fever, chills.  Elevated lactate on presentation.  Chest x-ray showed possible pneumonia in the right lung base as well as left perihilar region.  Started on ceftriaxone /azithromycin.  Follow-up cultures.  Not on oxygen at home.  Currently on 2 L of oxygen per minute.  We will try to wean the oxygen  Acute hypoxic respiratory failure: Most likely secondary to pneumonia.  Continue to wean the oxygen.  UTI: UA suspicious for UTI.  Follow-up cultures.  Continue current antibiotics.  He denies any dysuria  Recent history of lower GI bleed: Thought to be secondary to hemorrhoidal bleeding.  Currently hemoglobin stable.  No evidence of active blood loss  Metastatic lung cancer: Found to have multiple brain mets status post start tactic  radiosurgery.  We recommend continued goals of care discussion regarding his poor prognosis.  Follows with oncology as well  Chronic combined systolic/diastolic congestive heart failure: On Entresto.  Follows with cardiology.  Echo done on 04/26/2021 showed EF of 35 to 54%, grade 2 diastolic dysfunction.  Appears to be dehydrated on presentation, started on IV fluids  AKI CKD stage IIIa: Baseline creatinine is from 0.9-1.2.  Continue IV fluids for today.  Hypertension: On Entresto, metoprolol  History of gout: On allopurinol  Debility/deconditioning: Lives alone, will request PT/OT evaluation         DVT prophylaxis:SCDs Start: 06/11/2022 2221     Code Status: DNR  Family Communication: Called daughter on phone, call not received  Patient status: Inpatient  Patient is from : Home  Anticipated discharge to: Home  Estimated DC date: 2 to 3 days   Consultants: None  Procedures: None  Antimicrobials:  Anti-infectives (From admission, onward)    Start     Dose/Rate Route Frequency Ordered Stop   06/09/2022 2145  cefTRIAXone (ROCEPHIN) 2 g in sodium chloride 0.9 % 100 mL IVPB        2 g 200 mL/hr over 30 Minutes Intravenous Every 24 hours 06/24/2022 2142 06/20/22 2159   06/29/2022 2145  azithromycin (ZITHROMAX) 500 mg in sodium chloride 0.9 % 250 mL IVPB        500 mg 250 mL/hr over 60 Minutes Intravenous Every 24 hours 06/04/2022 2142 06/20/22 2159       Subjective: Patient seen and examined at the bedside this morning.  Hemodynamically stable.  Heart  rate controlled with Cardizem drip.  Denies any worsening shortness of breath or cough or chest pain.  Objective: Vitals:   06/16/22 0100 06/16/22 0230 06/16/22 0505 06/16/22 0734  BP: 130/75 133/82 112/70 121/73  Pulse:   77 71  Resp: (!) 22 (!) 29 20 18   Temp:  (!) 97.3 F (36.3 C) 97.7 F (36.5 C) 97.9 F (36.6 C)  TempSrc:  Oral Oral Oral  SpO2:   99%     Intake/Output Summary (Last 24 hours) at 06/16/2022  0740 Last data filed at 06/16/2022 0230 Gross per 24 hour  Intake 2812.91 ml  Output --  Net 2812.91 ml   There were no vitals filed for this visit.  Examination:  General exam: Overall comfortable, not in distress, deconditioned, pleasant elderly male HEENT: PERRL Respiratory system:  no wheezes or crackles  Cardiovascular system: Irregularly irregular rhythm  gastrointestinal system: Abdomen is nondistended, soft and nontender. Central nervous system: Alert and oriented Extremities: No edema, no clubbing ,no cyanosis Skin: No rashes, no ulcers,no icterus     Data Reviewed: I have personally reviewed following labs and imaging studies  CBC: Recent Labs  Lab 06/06/2022 2000 06/21/2022 2014 06/16/22 0459  WBC 7.6  --  7.4  NEUTROABS 5.8  --  5.4  HGB 10.6* 12.2* 9.3*  HCT 37.0* 36.0* 32.2*  MCV 92.3  --  90.4  PLT 350  --  818   Basic Metabolic Panel: Recent Labs  Lab 06/21/2022 2000 06/12/2022 2014 06/16/22 0459  NA 143 142 141  K 3.9 3.9 3.8  CL 106 110 109  CO2 17*  --  18*  GLUCOSE 128* 124* 106*  BUN 13 14 11   CREATININE 1.51* 1.20 1.24  CALCIUM 8.8*  --  8.7*  MG 1.9  --  2.2  PHOS  --   --  3.0     Recent Results (from the past 240 hour(s))  SARS Coronavirus 2 by RT PCR (hospital order, performed in [facility] hospital lab) *cepheid single result test* Anterior Nasal Swab     Status: None   Collection Time: 06/18/2022  8:36 PM   Specimen: Anterior Nasal Swab  Result Value Ref Range Status   SARS Coronavirus 2 by RT PCR NEGATIVE NEGATIVE Final    Comment: (NOTE) SARS-CoV-2 target nucleic acids are NOT DETECTED.  The SARS-CoV-2 RNA is generally detectable in upper and lower respiratory specimens during the acute phase of infection. The lowest concentration of SARS-CoV-2 viral copies this assay can detect is 250 copies / mL. A negative result does not preclude SARS-CoV-2 infection and should not be used as the sole basis for treatment or other patient  management decisions.  A negative result may occur with improper specimen collection / handling, submission of specimen other than nasopharyngeal swab, presence of viral mutation(s) within the areas targeted by this assay, and inadequate number of viral copies (<250 copies / mL). A negative result must be combined with clinical observations, patient history, and epidemiological information.  Fact Sheet for Patients:   https://www.patel.info/  Fact Sheet for Healthcare Providers: https://hall.com/  This test is not yet approved or  cleared by the [place] FDA and has been authorized for detection and/or diagnosis of SARS-CoV-2 by FDA under an Emergency Use Authorization (EUA).  This EUA will remain in effect (meaning this test can be used) for the duration of the COVID-19 declaration under Section 564(b)(1) of the Act, 21 U.S.C. section 360bbb-3(b)(1), unless the authorization is terminated or revoked sooner.  Performed at [facility], [place] [address] [place], [place] [address]   MRSA Next Gen by PCR, Nasal     Status: None   Collection Time: 06/16/22  2:29 AM   Specimen: Nasal Mucosa; Nasal Swab  Result Value Ref Range Status   MRSA by PCR Next Gen NOT DETECTED NOT DETECTED Final    Comment: (NOTE) The GeneXpert MRSA Assay (FDA approved for NASAL specimens only), is one component of a comprehensive MRSA colonization surveillance program. It is not intended to diagnose MRSA infection nor to guide or monitor treatment for MRSA infections. Test performance is not FDA approved in patients less than 71 years old. Performed at [facility], [place] [address], [place], [place] [address]      Radiology Studies: DG Chest Portable 1 View  Result Date: 06/03/2022 CLINICAL DATA:  Shortness of breath.  Atrial fibrillation.  Cough. EXAM: PORTABLE CHEST 1 VIEW COMPARISON:  January 15, 2022 FINDINGS: New left perihilar opacity. New  opacity in the right base. Stable cardiomegaly. The mediastinum is unchanged. No pneumothorax. Bullets project over the chest. Stable right Port-A-Cath. No other acute abnormalities. IMPRESSION: Increased opacity in the right base in the left perihilar region worrisome for pneumonia or aspiration. Recommend short-term follow-up imaging to ensure resolution. No other acute abnormalities. Electronically Signed   By: Dorise Bullion III M.D.   On: 06/21/2022 20:23    Scheduled Meds: Continuous Infusions:  azithromycin Stopped (06/16/22 0144)   cefTRIAXone (ROCEPHIN)  IV Stopped (06/30/2022 2313)   diltiazem (CARDIZEM) infusion 10 mg/hr (06/16/22 0230)   lactated ringers 100 mL/hr at 06/16/22 0230     LOS: 1 day   Shelly Coss, MD [facility] P7/16/2023, 7:40 AM

## 2022-06-16 NOTE — ED Triage Notes (Signed)
Patient arrived via [facility] with complaints of SHOB and weakness for about 4 days. Patient states weakness has been ongoing for months since he was started on chemo for lung cancer. EMS reported that he was found to have a-fib with RVR and went into VT en-route but converted without intervention. Per EMS BP-137/72, Spo2-95% on room air, CBG-162, on O2 2L [place] on as needed bases. Patient alerts and oriented at arrival to ED. States he used walker at home to walk.

## 2022-06-16 NOTE — Progress Notes (Signed)
  Transition of Care [facility]) Screening Note   Patient Details  Name: Robert Hamilton Date of Birth: Oct 22, 1934   Transition of Care [facility]) CM/SW Contact:    Bary Castilla, LCSW Phone Number: 06/16/2022, 12:19 PM    Transition of Care Department [facility]) has reviewed patient and no TOC needs have been identified at this time. We will continue to monitor patient advancement through interdisciplinary progression rounds. If new patient transition needs arise, please place a TOC consult.

## 2022-06-17 ENCOUNTER — Inpatient Hospital Stay (HOSPITAL_COMMUNITY): Payer: Medicare Other

## 2022-06-17 ENCOUNTER — Other Ambulatory Visit: Payer: Self-pay | Admitting: Oncology

## 2022-06-17 DIAGNOSIS — I4891 Unspecified atrial fibrillation: Secondary | ICD-10-CM | POA: Diagnosis not present

## 2022-06-17 LAB — CBC
HCT: 33 % — ABNORMAL LOW (ref 39.0–52.0)
Hemoglobin: 9.5 g/dL — ABNORMAL LOW (ref 13.0–17.0)
MCH: 25.9 pg — ABNORMAL LOW (ref 26.0–34.0)
MCHC: 28.8 g/dL — ABNORMAL LOW (ref 30.0–36.0)
MCV: 89.9 fL (ref 80.0–100.0)
Platelets: 233 10*3/uL (ref 150–400)
RBC: 3.67 MIL/uL — ABNORMAL LOW (ref 4.22–5.81)
RDW: 15.3 % (ref 11.5–15.5)
WBC: 8.2 10*3/uL (ref 4.0–10.5)
nRBC: 0 % (ref 0.0–0.2)

## 2022-06-17 LAB — BASIC METABOLIC PANEL
Anion gap: 15 (ref 5–15)
BUN: 11 mg/dL (ref 8–23)
CO2: 19 mmol/L — ABNORMAL LOW (ref 22–32)
Calcium: 8.7 mg/dL — ABNORMAL LOW (ref 8.9–10.3)
Chloride: 106 mmol/L (ref 98–111)
Creatinine, Ser: 1.67 mg/dL — ABNORMAL HIGH (ref 0.61–1.24)
GFR, Estimated: 40 mL/min — ABNORMAL LOW (ref >60)
Glucose, Bld: 127 mg/dL — ABNORMAL HIGH (ref 70–99)
Potassium: 4.8 mmol/L (ref 3.5–5.1)
Sodium: 140 mmol/L (ref 135–145)

## 2022-06-17 LAB — LACTIC ACID, PLASMA: Lactic Acid, Venous: 3.7 mmol/L (ref 0.5–1.9)

## 2022-06-17 MED ORDER — DOXYCYCLINE HYCLATE 100 MG PO TABS
100.0000 mg | ORAL_TABLET | Freq: Two times a day (BID) | ORAL | Status: AC
Start: 2022-06-17 — End: 2022-06-20
  Administered 2022-06-17 – 2022-06-19 (×4): 100 mg via ORAL
  Filled 2022-06-17 (×6): qty 1

## 2022-06-17 MED ORDER — SODIUM CHLORIDE 0.9 % IV SOLN: INTRAVENOUS | Status: DC

## 2022-06-17 MED ORDER — CHLORHEXIDINE GLUCONATE CLOTH 2 % EX PADS
6.0000 | MEDICATED_PAD | Freq: Every day | CUTANEOUS | Status: DC
  Administered 2022-06-17 – 2022-06-22 (×6): 6 via TOPICAL

## 2022-06-17 MED ORDER — SODIUM CHLORIDE 0.9% FLUSH: 10.0000 mL | INTRAVENOUS | Status: DC | PRN

## 2022-06-17 MED ORDER — GUAIFENESIN-DM 100-10 MG/5ML PO SYRP
5.0000 mL | ORAL_SOLUTION | ORAL | Status: DC | PRN
  Administered 2022-06-17 – 2022-06-18 (×2): 5 mL via ORAL
  Filled 2022-06-17 (×2): qty 5

## 2022-06-17 MED ORDER — METOPROLOL SUCCINATE ER 25 MG PO TB24
25.0000 mg | ORAL_TABLET | Freq: Every day | ORAL | Status: DC
  Administered 2022-06-17 – 2022-06-18 (×2): 25 mg via ORAL
  Filled 2022-06-17 (×2): qty 1

## 2022-06-17 MED ORDER — SODIUM CHLORIDE 0.9% FLUSH
10.0000 mL | Freq: Two times a day (BID) | INTRAVENOUS | Status: DC
  Administered 2022-06-17 – 2022-06-22 (×6): 10 mL

## 2022-06-17 NOTE — Progress Notes (Signed)
PROGRESS NOTE  Robert Hamilton  HAL:[id number] DOB: [birth year] DOA: 06/17/2022 PCP: Maryella Shivers, MD   Brief Narrative: Patient is 88 year old male with history of metastatic cancer to the brain, paroxysmal A-fib not on anticoagulation in the setting of GI bleed, chronic systolic/diastolic CHF who presented from home with cough, fast heartbeat, chills, shortness of breath.  Patient was recently hospitalized in May for acute lower GI bleed.  On admission he was in A-fib with RVR and had to be started on Cardizem drip.  He was noted to be hypoxic on room air.  Lactate was 4 on presentation.  Blood cultures were collected, started on antibiotics after finding of airspace opacities in the right lung base as well as left perihilar region concerning for pneumonia.  Started on antibiotics.   Assessment & Plan:  Principal Problem:   Atrial fibrillation with RVR (HCC) Active Problems:   AKI (acute kidney injury) ([place])   Chronic combined systolic and diastolic heart failure ([place])   CAP (community acquired pneumonia)   Lactic acidosis   Acute respiratory failure with hypoxia (HCC)  A-fib with RVR: Started on Cardizem drip.  .  Not on anticoagulation due to history of GI bleed.a CHA2DS2-VASc of 5.   He also may not be a candidate for anticoagulation due to history of metastatic lung cancer to the brain.  This morning rate is controlled.  Restarted home metoprolol   sepsis/community-acquired pneumonia/lactic acidosis: Presented with fever, chills.  Elevated lactate on presentation.  Chest x-ray showed possible pneumonia in the right lung base   Started on ceftriaxone /azithromycin.  Follow-up cultures.  On oxygen at 2 L as needed at home.  Currently on 2 L of oxygen per minute.   CT chest without contrast done for the follow-up exam showed moderate right-sided pleural effusion, requesting thoracentesis.  It also showed progression of left hilar mass.  Acute hypoxic respiratory failure: Most likely  secondary to pneumonia.  Continue to wean the oxygen.  UTI: UA suspicious for UTI.  Follow-up cultures.  Continue current antibiotics.  He denies any dysuria  Recent history of lower GI bleed: Thought to be secondary to hemorrhoidal bleeding.  Currently hemoglobin stable.  No evidence of active blood loss  Metastatic left lung cancer: Found to have multiple brain mets status post start tactic radiosurgery.  We recommend continued goals of care discussion regarding his poor prognosis.  Follows with oncology as well at [facility].  CT imaging showed progression of left hilar mass.  Chronic combined systolic/diastolic congestive heart failure: On Entresto.  Follows with cardiology.  Echo done on 04/26/2021 showed EF of 35 to 73%, grade 2 diastolic dysfunction.  Creatinine went up today.  Continue IV fluids. Entresto on hold due to AKI.  AKI CKD stage IIIa: Baseline creatinine is from 0.9-1.2.  Continue IV fluids for today for AKI.  Hypertension: On Entresto, metoprolol at home.  Currently blood pressure stable  History of gout: On allopurinol  Debility/deconditioning: Lives alone, will request PT/OT evaluation         DVT prophylaxis:SCDs Start: 06/08/2022 2221     Code Status: DNR  Family Communication: Called and discussed with daughter on phone on 7/17  Patient status: Inpatient  Patient is from : Home  Anticipated discharge to: Home  Estimated DC date: 2 to 3 days   Consultants: None  Procedures: None  Antimicrobials:  Anti-infectives (From admission, onward)    Start     Dose/Rate Route Frequency Ordered Stop   06/11/2022 2145  cefTRIAXone (  ROCEPHIN) 2 g in sodium chloride 0.9 % 100 mL IVPB        2 g 200 mL/hr over 30 Minutes Intravenous Every 24 hours 06/01/2022 2142 06/20/22 2159   06/26/2022 2145  azithromycin (ZITHROMAX) 500 mg in sodium chloride 0.9 % 250 mL IVPB        500 mg 250 mL/hr over 60 Minutes Intravenous Every 24 hours 06/30/2022 2142 06/20/22 2159        Subjective: Patient seen and examined the bedside this morning.  Hemodynamically stable.  Heart rate well controlled today on A-fib as per monitor.  On 2 to 4 L of oxygen per minute.  Not in respiratory distress but complains of weakness.  Objective: Vitals:   06/16/22 2058 06/16/22 2300 06/17/22 0401 06/17/22 0704  BP: (!) 142/78  136/83 126/74  Pulse: 83  86 69  Resp: (!) 22  (!) 25 (!) 21  Temp: 98 F (36.7 C) 97.6 F (36.4 C) 97.8 F (36.6 C) 97.9 F (36.6 C)  TempSrc: Oral Oral Oral Oral  SpO2: 99%  96% 90%  Weight:   65 kg   Height:   5\' 9"  (1.753 m)     Intake/Output Summary (Last 24 hours) at 06/17/2022 1043 Last data filed at 06/17/2022 0300 Gross per 24 hour  Intake 832.21 ml  Output 51 ml  Net 781.21 ml   Filed Weights   06/17/22 0401  Weight: 65 kg    Examination:      General exam: Overall comfortable, not in distress, deconditioned, weak, pleasant elderly male HEENT: PERRL Respiratory system: Diminished air sounds on the right side Cardiovascular system: Irregular heart rhythm Gastrointestinal system: Abdomen is nondistended, soft and nontender. Central nervous system: Alert and oriented Extremities: No edema, no clubbing ,no cyanosis Skin: No rashes, no ulcers,no icterus     Data Reviewed: I have personally reviewed following labs and imaging studies  CBC: Recent Labs  Lab 06/08/2022 2000 06/06/2022 2014 06/16/22 0459 06/17/22 0052  WBC 7.6  --  7.4 8.2  NEUTROABS 5.8  --  5.4  --   HGB 10.6* 12.2* 9.3* 9.5*  HCT 37.0* 36.0* 32.2* 33.0*  MCV 92.3  --  90.4 89.9  PLT 350  --  345 725   Basic Metabolic Panel: Recent Labs  Lab 06/27/2022 2000 06/27/2022 2014 06/16/22 0459 06/17/22 0052  NA 143 142 141 140  K 3.9 3.9 3.8 4.8  CL 106 110 109 106  CO2 17*  --  18* 19*  GLUCOSE 128* 124* 106* 127*  BUN 13 14 11 11   CREATININE 1.51* 1.20 1.24 1.67*  CALCIUM 8.8*  --  8.7* 8.7*  MG 1.9  --  2.2  --   PHOS  --   --  3.0  --       Recent Results (from the past 240 hour(s))  SARS Coronavirus 2 by RT PCR (hospital order, performed in [facility] hospital lab) *cepheid single result test* Anterior Nasal Swab     Status: None   Collection Time: 06/26/2022  8:36 PM   Specimen: Anterior Nasal Swab  Result Value Ref Range Status   SARS Coronavirus 2 by RT PCR NEGATIVE NEGATIVE Final    Comment: (NOTE) SARS-CoV-2 target nucleic acids are NOT DETECTED.  The SARS-CoV-2 RNA is generally detectable in upper and lower respiratory specimens during the acute phase of infection. The lowest concentration of SARS-CoV-2 viral copies this assay can detect is 250 copies / mL. A negative result does not  preclude SARS-CoV-2 infection and should not be used as the sole basis for treatment or other patient management decisions.  A negative result may occur with improper specimen collection / handling, submission of specimen other than nasopharyngeal swab, presence of viral mutation(s) within the areas targeted by this assay, and inadequate number of viral copies (<250 copies / mL). A negative result must be combined with clinical observations, patient history, and epidemiological information.  Fact Sheet for Patients:   https://www.patel.info/  Fact Sheet for Healthcare Providers: https://hall.com/  This test is not yet approved or  cleared by the [place] FDA and has been authorized for detection and/or diagnosis of SARS-CoV-2 by FDA under an Emergency Use Authorization (EUA).  This EUA will remain in effect (meaning this test can be used) for the duration of the COVID-19 declaration under Section 564(b)(1) of the Act, 21 U.S.C. section 360bbb-3(b)(1), unless the authorization is terminated or revoked sooner.  Performed at [facility], [place] [address] [place], [place] [address]   Blood culture (routine x 2)     Status: None (Preliminary result)   Collection Time:  06/17/2022  8:49 PM   Specimen: BLOOD RIGHT HAND  Result Value Ref Range Status   Specimen Description BLOOD RIGHT HAND  Final   Special Requests   Final    BOTTLES DRAWN AEROBIC ONLY Blood Culture results may not be optimal due to an inadequate volume of blood received in culture bottles   Culture   Final    NO GROWTH 2 DAYS Performed at [facility], [place] [address] [place], [place] [address]    Report Status PENDING  Incomplete  MRSA Next Gen by PCR, Nasal     Status: None   Collection Time: 06/16/22  2:29 AM   Specimen: Nasal Mucosa; Nasal Swab  Result Value Ref Range Status   MRSA by PCR Next Gen NOT DETECTED NOT DETECTED Final    Comment: (NOTE) The GeneXpert MRSA Assay (FDA approved for NASAL specimens only), is one component of a comprehensive MRSA colonization surveillance program. It is not intended to diagnose MRSA infection nor to guide or monitor treatment for MRSA infections. Test performance is not FDA approved in patients less than 70 years old. Performed at [facility], [place] [address], [place], [place] [address]   Blood culture (routine x 2)     Status: None (Preliminary result)   Collection Time: 06/16/22  4:59 AM   Specimen: BLOOD RIGHT HAND  Result Value Ref Range Status   Specimen Description BLOOD RIGHT HAND  Final   Special Requests   Final    BOTTLES DRAWN AEROBIC AND ANAEROBIC Blood Culture adequate volume   Culture   Final    NO GROWTH 1 DAY Performed at [facility], [place] [address], [place], [place] [address]    Report Status PENDING  Incomplete     Radiology Studies: CT CHEST WO CONTRAST  Result Date: 06/17/2022 CLINICAL DATA:  Abnormal chest x-ray; * Tracking Code: BO * EXAM: CT CHEST WITHOUT CONTRAST TECHNIQUE: Multidetector CT imaging of the chest was performed following the standard protocol without IV contrast. RADIATION DOSE REDUCTION: This exam was performed according to the departmental dose-optimization program which  includes automated exposure control, adjustment of the mA and/or kV according to patient size and/or use of iterative reconstruction technique. COMPARISON:  Chest x-ray dated June 15, 2022; CT chest angio dated January 14, 2022 FINDINGS: Cardiovascular: Cardiomegaly. No pericardial effusion. Mild lad and  circumflex coronary artery calcifications. Atherosclerotic disease of the thoracic aorta. Right chest wall port with tip near the superior cavoatrial junction. Mediastinum/Nodes: Thyroid and esophagus are unremarkable. No pathologically enlarged mediastinal or axillary lymph nodes. Lungs/Pleura: Central airways are patent. Moderate centrilobular emphysema. Left hilar mass is increased in size when compared with prior chest CT more superior component measures approximally 3.8 x 3.6 cm on series 4, image 63, previously 3.3 x 2.5 cm when remeasured at a similar location. More inferior and anterior component measures approximally 2.6 x 2.7 cm on series 4, image 77, previously 1.7 x 1.8 cm when remeasured in similar location. Increased narrowing of the adjacent central left lung bronchi. Increased linear opacity of the left upper lobe which is likely a combination of postradiation change and atelectasis. Moderate right and small left pleural effusions associated atelectasis, including near complete collapse of the right lower lobe. Upper Abdomen: No acute abnormality. Musculoskeletal: No chest wall mass or suspicious bone lesions identified. IMPRESSION: 1. Interval increased size of left hilar mass when compared with January 14, 2022 prior, concerning for progressive disease. 2. Increased bandlike opacity of the left upper lobe which is likely due to a combination of postradiation change and atelectasis related to increased narrowing of the central left lung bronchi. 3. Moderate right and small left pleural effusions with associated atelectasis, including near complete collapse of the right lower lobe. 4. Aortic  Atherosclerosis (ICD10-I70.0) and Emphysema (ICD10-J43.9). Electronically Signed   By: Yetta Glassman M.D.   On: 06/17/2022 10:22   DG Chest Portable 1 View  Result Date: 06/19/2022 CLINICAL DATA:  Shortness of breath.  Atrial fibrillation.  Cough. EXAM: PORTABLE CHEST 1 VIEW COMPARISON:  January 15, 2022 FINDINGS: New left perihilar opacity. New opacity in the right base. Stable cardiomegaly. The mediastinum is unchanged. No pneumothorax. Bullets project over the chest. Stable right Port-A-Cath. No other acute abnormalities. IMPRESSION: Increased opacity in the right base in the left perihilar region worrisome for pneumonia or aspiration. Recommend short-term follow-up imaging to ensure resolution. No other acute abnormalities. Electronically Signed   By: Dorise Bullion III M.D.   On: 06/02/2022 20:23    Scheduled Meds:  metoprolol succinate  25 mg Oral Daily   Continuous Infusions:  sodium chloride     azithromycin 250 mL/hr at 06/17/22 0300   cefTRIAXone (ROCEPHIN)  IV 200 mL/hr at 06/17/22 0300     LOS: 2 days   Shelly Coss, MD [facility] P7/17/2023, 10:43 AM

## 2022-06-17 NOTE — Evaluation (Signed)
Physical Therapy Evaluation Patient Details Name: Robert Hamilton MRN: [id number] DOB: September 08, 1934 Today's Date: 06/17/2022  History of Present Illness  88 yo male admitted 7/15 with increased HR and SOB. Pt with Afib with RVR, hypoxia, CAP and left lung mass. PMhx: met CA to the brain, paroxysmal Afib not on anticoagulation, CHF  Clinical Impression  Pt pleasant and reports living alone but daughter lives nearby. Daughter provides all transportation and assists with iADLS. Pt reports multiple falls and that he cannot walk more than across the room without giving out. Pt with RW at home that he utilizes but without WC. Daughter cannot provide 24hr assist and pt continues to have progressive decline in function. Per his report he is not eating and is fatigued all the time with inability to care for himself. Pt with decreased balance, strength and function who will benefit from acute therapy to maximize mobility and safety.     90-95% on 2L  HR 82-97     Recommendations for follow up therapy are one component of a multi-disciplinary discharge planning process, led by the attending physician.  Recommendations may be updated based on patient status, additional functional criteria and insurance authorization.  Follow Up Recommendations Skilled nursing-short term rehab (<3 hours/day) Can patient physically be transported by private vehicle: Yes    Assistance Recommended at Discharge Frequent or constant Supervision/Assistance  Patient can return home with the following  A little help with walking and/or transfers;Assistance with cooking/housework;A little help with bathing/dressing/bathroom;Direct supervision/assist for financial management;Direct supervision/assist for medications management;Assist for transportation    Equipment Recommendations BSC/3in1  Recommendations for Other Services       Functional Status Assessment Patient has had a recent decline in their functional status and  demonstrates the ability to make significant improvements in function in a reasonable and predictable amount of time.     Precautions / Restrictions Precautions Precautions: Fall;Other (comment) Precaution Comments: watch sats      Mobility  Bed Mobility Overal bed mobility: Needs Assistance Bed Mobility: Supine to Sit     Supine to sit: Supervision     General bed mobility comments: HOB 10 degrees with increased time and struggle to rise to sitting    Transfers Overall transfer level: Needs assistance   Transfers: Sit to/from Stand Sit to Stand: Min guard           General transfer comment: guarding for lines and safety with cues for direction to prevent tangling in lines. 95% with drop to 90% on 2L with activity    Ambulation/Gait Ambulation/Gait assistance: Min guard Gait Distance (Feet): 12 Feet Assistive device: Rolling walker (2 wheels) Gait Pattern/deviations: Step-through pattern, Decreased stride length   Gait velocity interpretation: <1.31 ft/sec, indicative of household ambulator   General Gait Details: pt limited by fatigue and reports his legs give out if he attempts to walk further. Pt on 2L throughout  Stairs            Wheelchair Mobility    Modified Rankin (Stroke Patients Only)       Balance Overall balance assessment: Needs assistance   Sitting balance-Leahy Scale: Fair Sitting balance - Comments: static sitting without UE support   Standing balance support: Bilateral upper extremity supported, Reliant on assistive device for balance Standing balance-Leahy Scale: Poor Standing balance comment: bil UE support in standing  Pertinent Vitals/Pain Pain Assessment Pain Assessment: 0-10 Pain Score: 5  Pain Location: right wrist Pain Descriptors / Indicators: Aching, Guarding Pain Intervention(s): Limited activity within patient's tolerance, Monitored during session, Heat applied, Repositioned     Home Living Family/patient expects to be discharged to:: Private residence Living Arrangements: Alone Available Help at Discharge: Family;Available PRN/intermittently Type of Home: Apartment Home Access: Level entry       Home Layout: One level Home Equipment: Conservation officer, nature (2 wheels);Kasandra Knudsen - single point Additional Comments: daughter lives within a mile    Prior Function Prior Level of Function : Needs assist             Mobility Comments: RW for gait only walking about 5' at a time ADLs Comments: requires assist for IADL's from daughter i.e. medication management, meals. Pt reports he does ADLs on his own with significantly increased time     Hand Dominance        Extremity/Trunk Assessment   Upper Extremity Assessment Upper Extremity Assessment: Generalized weakness    Lower Extremity Assessment Lower Extremity Assessment: Generalized weakness    Cervical / Trunk Assessment Cervical / Trunk Assessment: Normal  Communication   Communication: No difficulties  Cognition Arousal/Alertness: Awake/alert Behavior During Therapy: WFL for tasks assessed/performed Overall Cognitive Status: No family/caregiver present to determine baseline cognitive functioning Area of Impairment: Problem solving, Safety/judgement                         Safety/Judgement: Decreased awareness of safety, Decreased awareness of deficits   Problem Solving: Slow processing General Comments: pt reporting he can barely walk 10' at home, "falls all the time" and lives alone but no awareness of why this is unsafe        General Comments      Exercises     Assessment/Plan    PT Assessment Patient needs continued PT services  PT Problem List Decreased activity tolerance;Decreased balance;Decreased knowledge of use of DME;Decreased cognition;Cardiopulmonary status limiting activity;Decreased mobility;Decreased safety awareness;Decreased strength       PT Treatment  Interventions Gait training;Balance training;Functional mobility training;Therapeutic activities;Patient/family education;Cognitive remediation;DME instruction;Therapeutic exercise    PT Goals (Current goals can be found in the Care Plan section)  Acute Rehab PT Goals Patient Stated Goal: return home PT Goal Formulation: With patient Time For Goal Achievement: 07/01/22 Potential to Achieve Goals: Fair    Frequency Min 3X/week     Co-evaluation               AM-PAC PT "6 Clicks" Mobility  Outcome Measure Help needed turning from your back to your side while in a flat bed without using bedrails?: A Little Help needed moving from lying on your back to sitting on the side of a flat bed without using bedrails?: A Little Help needed moving to and from a bed to a chair (including a wheelchair)?: A Little Help needed standing up from a chair using your arms (e.g., wheelchair or bedside chair)?: A Little Help needed to walk in hospital room?: A Little Help needed climbing 3-5 steps with a railing? : Total 6 Click Score: 16    End of Session Equipment Utilized During Treatment: Gait belt;Oxygen Activity Tolerance: Patient tolerated treatment well Patient left: in chair;with call bell/phone within reach;with chair alarm set Nurse Communication: Mobility status PT Visit Diagnosis: Other abnormalities of gait and mobility (R26.89)    Time: 1202-1223 PT Time Calculation (min) (ACUTE ONLY): 21 min   Charges:  PT Evaluation $PT Eval Moderate Complexity: 1 Mod          Belem Hintze P, PT Acute Rehabilitation Services Office: [phone]   Sandy Salaam Keshaun Dubey 06/17/2022, 1:04 PM

## 2022-06-18 ENCOUNTER — Inpatient Hospital Stay (HOSPITAL_COMMUNITY): Payer: Medicare Other

## 2022-06-18 DIAGNOSIS — I4891 Unspecified atrial fibrillation: Secondary | ICD-10-CM | POA: Diagnosis not present

## 2022-06-18 HISTORY — PX: IR THORACENTESIS ASP PLEURAL SPACE W/IMG GUIDE: IMG5380

## 2022-06-18 LAB — BODY FLUID CELL COUNT WITH DIFFERENTIAL
Lymphs, Fluid: 72 %
Monocyte-Macrophage-Serous Fluid: 11 % — ABNORMAL LOW (ref 50–90)
Neutrophil Count, Fluid: 17 % (ref 0–25)
Total Nucleated Cell Count, Fluid: 200 cu mm (ref 0–1000)

## 2022-06-18 LAB — BASIC METABOLIC PANEL
Anion gap: 11 (ref 5–15)
BUN: 11 mg/dL (ref 8–23)
CO2: 22 mmol/L (ref 22–32)
Calcium: 8.4 mg/dL — ABNORMAL LOW (ref 8.9–10.3)
Chloride: 109 mmol/L (ref 98–111)
Creatinine, Ser: 1.21 mg/dL (ref 0.61–1.24)
GFR, Estimated: 59 mL/min — ABNORMAL LOW (ref >60)
Glucose, Bld: 100 mg/dL — ABNORMAL HIGH (ref 70–99)
Potassium: 3.4 mmol/L — ABNORMAL LOW (ref 3.5–5.1)
Sodium: 142 mmol/L (ref 135–145)

## 2022-06-18 LAB — LACTIC ACID, PLASMA: Lactic Acid, Venous: 1.4 mmol/L (ref 0.5–1.9)

## 2022-06-18 LAB — GRAM STAIN

## 2022-06-18 LAB — PROTEIN, PLEURAL OR PERITONEAL FLUID: Total protein, fluid: <3 g/dL

## 2022-06-18 LAB — GLUCOSE, PLEURAL OR PERITONEAL FLUID: Glucose, Fluid: 111 mg/dL

## 2022-06-18 MED ORDER — LOPERAMIDE HCL 2 MG PO CAPS
2.0000 mg | ORAL_CAPSULE | Freq: Three times a day (TID) | ORAL | Status: DC | PRN
  Administered 2022-06-18: 2 mg via ORAL
  Filled 2022-06-18: qty 1

## 2022-06-18 MED ORDER — MELATONIN 5 MG PO TABS
5.0000 mg | ORAL_TABLET | Freq: Every evening | ORAL | Status: DC | PRN
  Administered 2022-06-18 – 2022-06-22 (×2): 5 mg via ORAL
  Filled 2022-06-18 (×2): qty 1

## 2022-06-18 MED ORDER — POTASSIUM CHLORIDE CRYS ER 20 MEQ PO TBCR
40.0000 meq | EXTENDED_RELEASE_TABLET | Freq: Once | ORAL | Status: AC
  Administered 2022-06-18: 40 meq via ORAL
  Filled 2022-06-18: qty 2

## 2022-06-18 MED ORDER — MELATONIN 5 MG PO TABS
ORAL_TABLET | ORAL | Status: AC
  Filled 2022-06-18: qty 1

## 2022-06-18 MED ORDER — LIDOCAINE HCL (PF) 1 % IJ SOLN
INTRAMUSCULAR | Status: DC | PRN
  Administered 2022-06-18: 5 mL

## 2022-06-18 MED ORDER — METOPROLOL SUCCINATE ER 50 MG PO TB24
50.0000 mg | ORAL_TABLET | Freq: Every day | ORAL | Status: DC
  Administered 2022-06-19 – 2022-06-22 (×4): 50 mg via ORAL
  Filled 2022-06-18 (×4): qty 1

## 2022-06-18 MED ORDER — LIDOCAINE HCL 1 % IJ SOLN
INTRAMUSCULAR | Status: AC
  Filled 2022-06-18: qty 20

## 2022-06-18 MED ORDER — DILTIAZEM HCL-DEXTROSE 125-5 MG/125ML-% IV SOLN (PREMIX)
5.0000 mg/h | INTRAVENOUS | Status: DC
  Administered 2022-06-18: 5 mg/h via INTRAVENOUS
  Administered 2022-06-19: 7.5 mg/h via INTRAVENOUS
  Filled 2022-06-18 (×3): qty 125

## 2022-06-18 MED ORDER — METOPROLOL SUCCINATE ER 25 MG PO TB24
25.0000 mg | ORAL_TABLET | Freq: Once | ORAL | Status: AC
  Administered 2022-06-18: 25 mg via ORAL
  Filled 2022-06-18: qty 1

## 2022-06-18 NOTE — Progress Notes (Signed)
Physical Therapy Treatment Patient Details Name: Robert Hamilton MRN: [id number] DOB: 06/03/1934 Today's Date: 06/18/2022   History of Present Illness 88 yo male admitted 7/15 with increased HR and SOB. Pt with Afib with RVR, hypoxia, CAP and left lung mass. 7/18 thoracentesis. PMhx: met CA to the brain, paroxysmal Afib not on anticoagulation, CHF    PT Comments    Pt reporting need to get to bathroom on arrival however pt already incontinent of stool. Assisted to [facility] with max assist for pericare, physical assist for all transfers and pt limited by fatigue. Pt performed 3 standing trials and 2 pivots, refused attempt at gait. Return to bed with pt demonstrating increased confusion from 7/17 and continue to recommend SNF.   HR 110-155 SpO2 88-94% on 2L    Recommendations for follow up therapy are one component of a multi-disciplinary discharge planning process, led by the attending physician.  Recommendations may be updated based on patient status, additional functional criteria and insurance authorization.  Follow Up Recommendations  Skilled nursing-short term rehab (<3 hours/day) Can patient physically be transported by private vehicle: Yes   Assistance Recommended at Discharge Frequent or constant Supervision/Assistance  Patient can return home with the following A little help with walking and/or transfers;Assistance with cooking/housework;A little help with bathing/dressing/bathroom;Direct supervision/assist for financial management;Direct supervision/assist for medications management;Assist for transportation   Equipment Recommendations  BSC/3in1    Recommendations for Other Services       Precautions / Restrictions Precautions Precautions: Fall;Other (comment) Precaution Comments: watch sats, incontinent of stool     Mobility  Bed Mobility Overal bed mobility: Needs Assistance Bed Mobility: Supine to Sit     Supine to sit: Min assist, HOB elevated     General bed  mobility comments: HOB 20 degrees with increased time, struggle and max cues with min assist to rise from surface. Return to bed without physical assist with cues for positioning    Transfers Overall transfer level: Needs assistance   Transfers: Sit to/from Stand, Bed to chair/wheelchair/BSC Sit to Stand: Min assist Stand pivot transfers: Min assist         General transfer comment: min assist to stand from bed and BSC with mod cues for hand placement and sequence (x 3 trials). min assist to pivot bed<>BSC with RW. pt fatigued with each transfers with time to recover and HR max 155    Ambulation/Gait               General Gait Details: pt denied attempting   Stairs             Wheelchair Mobility    Modified Rankin (Stroke Patients Only)       Balance Overall balance assessment: Needs assistance   Sitting balance-Leahy Scale: Fair Sitting balance - Comments: static sitting without UE support   Standing balance support: Bilateral upper extremity supported, Reliant on assistive device for balance Standing balance-Leahy Scale: Poor Standing balance comment: bil UE support in standing                            Cognition Arousal/Alertness: Awake/alert Behavior During Therapy: Flat affect Overall Cognitive Status: No family/caregiver present to determine baseline cognitive functioning Area of Impairment: Problem solving, Safety/judgement, Attention                   Current Attention Level: Sustained     Safety/Judgement: Decreased awareness of safety, Decreased awareness of deficits  Problem Solving: Slow processing, Requires verbal cues General Comments: pt unsure if he had BM in the bed, found with incontinent diarrhea on arrival. pt unable to sequence how to perform transfers without sequential steps        Exercises      General Comments        Pertinent Vitals/Pain Pain Assessment Pain Assessment: 0-10 Pain Score: 4   Pain Location: left arm Pain Descriptors / Indicators: Aching, Guarding Pain Intervention(s): Limited activity within patient's tolerance, Monitored during session, Repositioned    Home Living                          Prior Function            PT Goals (current goals can now be found in the care plan section) Progress towards PT goals: Not progressing toward goals - comment (limited by fatigue, incontinence and HR max 155 with limited mobility)    Frequency    Min 3X/week      PT Plan Current plan remains appropriate    Co-evaluation              AM-PAC PT "6 Clicks" Mobility   Outcome Measure  Help needed turning from your back to your side while in a flat bed without using bedrails?: A Little Help needed moving from lying on your back to sitting on the side of a flat bed without using bedrails?: A Little Help needed moving to and from a bed to a chair (including a wheelchair)?: A Little Help needed standing up from a chair using your arms (e.g., wheelchair or bedside chair)?: A Little Help needed to walk in hospital room?: A Lot Help needed climbing 3-5 steps with a railing? : Total 6 Click Score: 15    End of Session Equipment Utilized During Treatment: Oxygen Activity Tolerance: Patient limited by fatigue Patient left: in bed;with call bell/phone within reach;with bed alarm set Nurse Communication: Mobility status PT Visit Diagnosis: Other abnormalities of gait and mobility (R26.89)     Time: 2072-1828 PT Time Calculation (min) (ACUTE ONLY): 26 min  Charges:  $Therapeutic Activity: 23-37 mins                     Bayard Males, PT Acute Rehabilitation Services Office: [place] 06/18/2022, 2:03 PM

## 2022-06-18 NOTE — Progress Notes (Signed)
   06/18/22 1356  Assess: MEWS Score  Pulse Rate (!) 155  SpO2 94 %  O2 Device Nasal Cannula  O2 Flow Rate (L/min) 2 L/min  Assess: MEWS Score  MEWS Temp 0  MEWS Systolic 0  MEWS Pulse 3  MEWS RR 1  MEWS LOC 0  MEWS Score 4  MEWS Score Color Red  Assess: if the MEWS score is Yellow or Red  Were vital signs taken at a resting state? No  Focused Assessment No change from prior assessment  Does the patient meet 2 or more of the SIRS criteria? No  Does the patient have a confirmed or suspected source of infection? No  MEWS guidelines implemented *See Row Information* No, vital signs rechecked  Notify: Charge Nurse/RN  Name of Charge Nurse/RN Notified kristy Rn  Date Charge Nurse/RN Notified 06/18/22  Time Charge Nurse/RN Notified 50  Notify: Provider  Provider Name/Title DR. Tawanna Solo  Date Provider Notified 06/18/22  Time Provider Notified 0370  Method of Notification  (secure chat)  Notification Reason Other (Comment) (increased of heart rate on activity.)  Provider response No new orders  Date of Provider Response 06/18/22  Time of Provider Response 1424  Document  Patient Outcome Other (Comment);Not stable and remains on department (assessed)  Progress note created (see row info) Yes  Assess: SIRS CRITERIA  SIRS Temperature  0  SIRS Pulse 1  SIRS Respirations  1  SIRS WBC 1  SIRS Score Sum  3

## 2022-06-18 NOTE — Plan of Care (Signed)
  Problem: Elimination: Goal: Will not experience complications related to urinary retention Outcome: Progressing   Problem: Pain Managment: Goal: General experience of comfort will improve Outcome: Progressing   Problem: Clinical Measurements: Goal: Respiratory complications will improve Outcome: Not Progressing Goal: Cardiovascular complication will be avoided Outcome: Not Progressing   Problem: Activity: Goal: Risk for activity intolerance will decrease Outcome: Not Progressing   Problem: Nutrition: Goal: Adequate nutrition will be maintained Outcome: Not Progressing   Problem: Coping: Goal: Level of anxiety will decrease Outcome: Not Progressing

## 2022-06-18 NOTE — Evaluation (Signed)
Occupational Therapy Evaluation Patient Details Name: Robert Hamilton MRN: [id number] DOB: Dec 09, 1933 Today's Date: 06/18/2022   History of Present Illness 88 yo male admitted 7/15 with increased HR and SOB. Pt with Afib with RVR, hypoxia, CAP and left lung mass. 7/18 thoracentesis. PMhx: met CA to the brain, paroxysmal Afib not on anticoagulation, CHF   Clinical Impression   Limited OT eval secondary to fatigue and tachycardia with HR ranging from low 100s up to 141 with sitting EOB.  Oxygen sats maintained greater than 93-94% on 4Ls nasal cannula.  Currently, needs mod to max assist for LB selfcare sit to stand secondary to limited flexibility for reaching his LEs with stimulation.  Min guard for supine to sit and for scooting up to the EOB.  He declined attempt to stand secondary to dizziness in sitting with transition to supine before BP could be obtained.  BP in supine with HOB up approximately 20 degrees 125/80.  Feel he will benefit from acute care OT to progress ADL status to a more independent level, however anticipate the need for follow-up SNF level therapy secondary to not having 24 hr supervision at home.           Recommendations for follow up therapy are one component of a multi-disciplinary discharge planning process, led by the attending physician.  Recommendations may be updated based on patient status, additional functional criteria and insurance authorization.   Follow Up Recommendations  Skilled nursing-short term rehab (<3 hours/day)    Assistance Recommended at Discharge Frequent or constant Supervision/Assistance  Patient can return home with the following A little help with walking and/or transfers;A little help with bathing/dressing/bathroom;Assistance with cooking/housework;Assist for transportation;Help with stairs or ramp for entrance;Direct supervision/assist for medications management    Functional Status Assessment  Patient has had a recent decline in their  functional status and demonstrates the ability to make significant improvements in function in a reasonable and predictable amount of time.  Equipment Recommendations  None recommended by OT;Other (comment) (TBD next venue of care.)       Precautions / Restrictions Precautions Precautions: Fall;Other (comment) Precaution Comments: watch sats, incontinent of stool Restrictions Weight Bearing Restrictions: No      Mobility Bed Mobility Overal bed mobility: Needs Assistance Bed Mobility: Supine to Sit, Sit to Supine     Supine to sit: Min guard Sit to supine: Min guard   General bed mobility comments: Pt able to throw himself foward using the RUE to pull up on the EOB for supine to sit rapidly at min guard assist level.  Prior to this when attempting to have him roll over onto his side and then transition from sidelying to sitting he stated "I can't do It, that's not the way I've been doing it for 40 years".    Transfers                   General transfer comment: Pt not wanting to stand secondary to fatigue and dizziness but did scoot up toward the top of the bed a min guard assist level before transitioning to supine.      Balance Overall balance assessment: Needs assistance Sitting-balance support: Feet supported, Bilateral upper extremity supported Sitting balance-Leahy Scale: Fair                                     ADL either performed or assessed with clinical judgement  ADL Overall ADL's : Needs assistance/impaired Eating/Feeding: Independent;Sitting   Grooming: Wash/dry face;Set up;Sitting   Upper Body Bathing: Supervision/ safety;Sitting   Lower Body Bathing: Minimal assistance;Sit to/from stand Lower Body Bathing Details (indicate cue type and reason): simulated Upper Body Dressing : Minimal assistance;Sitting Upper Body Dressing Details (indicate cue type and reason): simulated Lower Body Dressing: Maximal assistance;Sit to/from  stand Lower Body Dressing Details (indicate cue type and reason): simulated pt declined standing               General ADL Comments: Limited OT eval secondary to pt being fatigued and having tacycardia.  Heart rate varied from low 100s to 140 BPM in sitting and with scooting up toward the top of the bed.  Oxygen sats at 93% or greater throughout on 4Ls nasal cannula.     Vision Baseline Vision/History: 0 No visual deficits Ability to See in Adequate Light: 0 Adequate Patient Visual Report: No change from baseline Vision Assessment?: No apparent visual deficits (Will continue to assess in function)     Perception Perception Perception: Within Functional Limits   Praxis Praxis Praxis: Intact    Pertinent Vitals/Pain Pain Assessment Pain Assessment: Faces Faces Pain Scale: Hurts a little bit Pain Location: neck pain Pain Descriptors / Indicators: Discomfort Pain Intervention(s): Limited activity within patient's tolerance, Monitored during session, Repositioned        Extremity/Trunk Assessment Upper Extremity Assessment Upper Extremity Assessment: LUE deficits/detail;RUE deficits/detail RUE Deficits / Details: shoulder strength 3/5 with all other joints 4/5 throughout RUE Sensation: WNL RUE Coordination: WNL (no formally assessed) LUE Deficits / Details: Shoulder flexion AROM 0-60 degrees AAROM 0-150 degrees with history of shoulder dysfunction noted.  All other joints AROM WFLS with strength 3+/5. LUE Sensation: decreased light touch (pt reports numbness in fingertips) LUE Coordination: WNL (not formally assessed)       Cervical / Trunk Assessment Cervical / Trunk Assessment: Normal   Communication Communication Communication: (P) No difficulties   Cognition Arousal/Alertness: Awake/alert Behavior During Therapy: Flat affect Overall Cognitive Status: No family/caregiver present to determine baseline cognitive functioning Area of Impairment: Problem solving,  Awareness, Memory, Orientation                 Orientation Level: Disoriented to, Time, Situation (Pt stated "October" and then "November") Current Attention Level: Sustained Memory: Decreased short-term memory   Safety/Judgement: Decreased awareness of safety, Decreased awareness of deficits Awareness: Emergent                    Home Living Family/patient expects to be discharged to:: (P) Private residence Living Arrangements: (P) Alone Available Help at Discharge: (P) Family;Available PRN/intermittently Type of Home: (P) Apartment Home Access: (P) Level entry     Home Layout: (P) One level     Bathroom Shower/Tub: (P) Tub/shower unit   Bathroom Toilet: (P) Standard     Home Equipment: (P) Rolling Walker (2 wheels);Cane - single point   Additional Comments: (P) daughter lives within a mile      Prior Functioning/Environment Prior Level of Function : (P) Needs assist             Mobility Comments: (P) RW for gait only walking about 5' at a time ADLs Comments: (P) requires assist for IADL's from daughter i.e. medication management, meals. Pt reports he does ADLs on his own with significantly increased time        OT Problem List: Decreased strength;Decreased knowledge of use of DME or AE;Decreased activity tolerance;Decreased  cognition;Impaired balance (sitting and/or standing)      OT Treatment/Interventions: Self-care/ADL training;Therapeutic exercise;DME and/or AE instruction;Therapeutic activities;Balance training;Patient/family education    OT Goals(Current goals can be found in the care plan section) Acute Rehab OT Goals Patient Stated Goal: Pt did not state specifically during session. OT Goal Formulation: With patient Time For Goal Achievement: 07/02/22 Potential to Achieve Goals: Good  OT Frequency: Min 2X/week       AM-PAC OT "6 Clicks" Daily Activity     Outcome Measure Help from another person eating meals?: None Help from another  person taking care of personal grooming?: A Little Help from another person toileting, which includes using toliet, bedpan, or urinal?: A Little Help from another person bathing (including washing, rinsing, drying)?: A Little Help from another person to put on and taking off regular upper body clothing?: A Little Help from another person to put on and taking off regular lower body clothing?: A Little 6 Click Score: 19   End of Session Nurse Communication: Mobility status  Activity Tolerance: Patient limited by fatigue Patient left: in bed;with call bell/phone within reach;with bed alarm set  OT Visit Diagnosis: Muscle weakness (generalized) (M62.81);Unsteadiness on feet (R26.81);Other symptoms and signs involving cognitive function                Time: 1352-1418 OT Time Calculation (min): 26 min Charges:  OT General Charges $OT Visit: 1 Visit OT Evaluation $OT Eval Moderate Complexity: 1 Mod OT Treatments $Self Care/Home Management : 8-22 mins Yarielys Beed,Atilano OTR/L 06/18/2022, 2:46 PM

## 2022-06-18 NOTE — Progress Notes (Signed)
Transported to interventional radiology  for thoracentesis by bed awake and alert.

## 2022-06-18 NOTE — Progress Notes (Signed)
Pt is experiencing sxs of confusion and unable to sleep. Paged MD and made aware, awaiting order.

## 2022-06-18 NOTE — Care Management Important Message (Signed)
Important Message  Patient Details  Name: Robert Hamilton MRN: [id number] Date of Birth: 04-Aug-1934   Medicare Important Message Given:  Yes     Orbie Pyo 06/18/2022, 2:24 PM

## 2022-06-18 NOTE — Procedures (Signed)
PROCEDURE SUMMARY:  Successful image-guided right-sided diagnostic and therapeutic thoracentesis. Yielded 1.3L of yellow fluid. Pt tolerated procedure well. No immediate complications. EBL = trace   Specimen was sent for labs. CXR ordered.  Please see imaging section of Epic for full dictation.  Lura Em PA-C 06/18/2022 9:19 AM

## 2022-06-18 NOTE — Progress Notes (Signed)
Pulse ox is not picking up due to fingers are cold. Probe has been changed  multiple times. Checked pulse ox intermittently.. latest- 93 on o2 2l [place] , continue to monitor.

## 2022-06-18 NOTE — Progress Notes (Signed)
Had diarrhea as noted by PT. MD made aware, imodium given. Continue to monitor.
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
## 2022-06-19 ENCOUNTER — Encounter (HOSPITAL_COMMUNITY): Payer: Self-pay | Admitting: Internal Medicine

## 2022-06-19 DIAGNOSIS — R0602 Shortness of breath: Secondary | ICD-10-CM

## 2022-06-19 DIAGNOSIS — I4891 Unspecified atrial fibrillation: Secondary | ICD-10-CM | POA: Diagnosis not present

## 2022-06-19 DIAGNOSIS — I5042 Chronic combined systolic (congestive) and diastolic (congestive) heart failure: Secondary | ICD-10-CM | POA: Diagnosis not present

## 2022-06-19 DIAGNOSIS — Z515 Encounter for palliative care: Secondary | ICD-10-CM

## 2022-06-19 DIAGNOSIS — N179 Acute kidney failure, unspecified: Secondary | ICD-10-CM | POA: Diagnosis not present

## 2022-06-19 DIAGNOSIS — J189 Pneumonia, unspecified organism: Secondary | ICD-10-CM | POA: Diagnosis not present

## 2022-06-19 DIAGNOSIS — Z7189 Other specified counseling: Secondary | ICD-10-CM

## 2022-06-19 LAB — CBC
HCT: 32.7 % — ABNORMAL LOW (ref 39.0–52.0)
Hemoglobin: 9.1 g/dL — ABNORMAL LOW (ref 13.0–17.0)
MCH: 25.8 pg — ABNORMAL LOW (ref 26.0–34.0)
MCHC: 27.8 g/dL — ABNORMAL LOW (ref 30.0–36.0)
MCV: 92.6 fL (ref 80.0–100.0)
Platelets: 367 10*3/uL (ref 150–400)
RBC: 3.53 MIL/uL — ABNORMAL LOW (ref 4.22–5.81)
RDW: 15.4 % (ref 11.5–15.5)
WBC: 9.1 10*3/uL (ref 4.0–10.5)
nRBC: 0.2 % (ref 0.0–0.2)

## 2022-06-19 LAB — BASIC METABOLIC PANEL
Anion gap: 10 (ref 5–15)
BUN: 14 mg/dL (ref 8–23)
CO2: 17 mmol/L — ABNORMAL LOW (ref 22–32)
Calcium: 8.7 mg/dL — ABNORMAL LOW (ref 8.9–10.3)
Chloride: 113 mmol/L — ABNORMAL HIGH (ref 98–111)
Creatinine, Ser: 1.73 mg/dL — ABNORMAL HIGH (ref 0.61–1.24)
GFR, Estimated: 38 mL/min — ABNORMAL LOW (ref >60)
Glucose, Bld: 108 mg/dL — ABNORMAL HIGH (ref 70–99)
Potassium: 4.7 mmol/L (ref 3.5–5.1)
Sodium: 140 mmol/L (ref 135–145)

## 2022-06-19 LAB — CYTOLOGY - NON PAP

## 2022-06-19 MED ORDER — DIVALPROEX SODIUM 125 MG PO CSDR
250.0000 mg | DELAYED_RELEASE_CAPSULE | Freq: Two times a day (BID) | ORAL | Status: DC
  Administered 2022-06-19 – 2022-06-22 (×7): 250 mg via ORAL
  Filled 2022-06-19 (×8): qty 2

## 2022-06-19 MED ORDER — DIVALPROEX SODIUM 125 MG PO CSDR: 250.0000 mg | DELAYED_RELEASE_CAPSULE | Freq: Two times a day (BID) | ORAL | Status: DC

## 2022-06-19 MED ORDER — ORAL CARE MOUTH RINSE: 15.0000 mL | OROMUCOSAL | Status: DC | PRN

## 2022-06-19 MED ORDER — HALOPERIDOL 1 MG PO TABS
2.0000 mg | ORAL_TABLET | Freq: Once | ORAL | Status: AC
  Administered 2022-06-19: 2 mg via ORAL
  Filled 2022-06-19: qty 2

## 2022-06-19 NOTE — Progress Notes (Signed)
Confuse, starting getting out of bed, MD made aware with order. Continue to monitor.

## 2022-06-19 NOTE — Progress Notes (Signed)
Offered ensure  drinks since he refused to eat breakfast , claimed that he he cannot take it. Prefers to drink cold water.

## 2022-06-19 NOTE — Progress Notes (Signed)
[facility]  PROGRESS NOTE  Robert Hamilton DXI:[id number] DOB: May 05, 1934 DOA: 06/07/2022 PCP: Maryella Shivers, MD   Brief HPI:   88 year old male with history of metastatic cancer to brain, paroxysmal atrial fibrillation not on anticoagulation in setting of GI bleed, chronic systolic/diastolic CHF who presented from home with cough, fast heartbeat, chills and shortness of breath.  Patient was recently hospitalized in May for acute lower GI bleed.  During this admission patient was found to be in A-fib with RVR, started on Cardizem drip.  Also found to have airspace opacities in right lung base as well as left perihilar region concerning for pneumonia.  Patient started on antibiotics.    Subjective   Patient seen and examined, denies shortness of breath.   Assessment/Plan:    Atrial fibrillation with RVR -Started on Cardizem gtt. which has been discontinued -Not on anticoagulation due to history of GI bleed; CHA2DS2-VASc score of 5 -Also has mets to brain, likely not a candidate for anticoagulation -Continue metoprolol 50 mg daily  Sepsis/community-acquired pneumonia -Presents with fever, chills, elevated lactate on presentation -Chest x-ray showed possible pneumonia in the right lung base -Started on ceftriaxone/doxycycline -He is on oxygen 2 L/min as needed at home -CT chest without contrast done for the follow-up exam showed moderate right-sided pleural effusion, S/P  thoracentesis.  It also showed progression of left hilar mass.  Recent history of lower GI bleed -Felt to be due to hemorrhoidal bleeding -Hemoglobin stable at 9.1  Metastatic left lung cancer -Found to have multiple brain metastasis s/p stereotactic radiosurgery -Palliative care consulted for poor prognosis -CT imaging showed progressive left hilar mass  Chronic combined systolic/diastolic CHF -Entresto was held due to AKI -Echo done on 04/26/2021 showed EF of 35 to 76%, grade 2 diastolic  dysfunction  Acute kidney injury on CKD stage III a -Baseline creatinine 0.9-1.2 -Creatinine has been fluctuating over past few days -Follow BMP in am  Hypertension -Continue Entresto, metoprolol  History of gout -Continue allopurinol  Disposition -Plan to go to skilled nursing facility rehab   Medications     Chlorhexidine Gluconate Cloth  6 each Topical Daily   doxycycline  100 mg Oral Q12H   metoprolol succinate  50 mg Oral Daily   sodium chloride flush  10-40 mL Intracatheter Q12H     Data Reviewed:   CBG:  No results for input(s): "GLUCAP" in the last 168 hours.  SpO2: 94 % O2 Flow Rate (L/min): 5 L/min    Vitals:   06/19/22 0302 06/19/22 0500 06/19/22 0722 06/19/22 1220  BP: 124/74     Pulse: 74  83   Resp:      Temp: 97.6 F (36.4 C)     TempSrc: Axillary     SpO2: 90%  94% 94%  Weight:  66.5 kg    Height:          Data Reviewed:  Basic Metabolic Panel: Recent Labs  Lab 06/16/2022 2000 06/26/2022 2014 06/16/22 0459 06/17/22 0052 06/18/22 0305 06/19/22 0526  NA 143 142 141 140 142 140  K 3.9 3.9 3.8 4.8 3.4* 4.7  CL 106 110 109 106 109 113*  CO2 17*  --  18* 19* 22 17*  GLUCOSE 128* 124* 106* 127* 100* 108*  BUN 13 14 11 11 11 14   CREATININE 1.51* 1.20 1.24 1.67* 1.21 1.73*  CALCIUM 8.8*  --  8.7* 8.7* 8.4* 8.7*  MG 1.9  --  2.2  --   --   --  PHOS  --   --  3.0  --   --   --     CBC: Recent Labs  Lab 06/03/2022 2000 06/04/2022 2014 06/16/22 0459 06/17/22 0052 06/19/22 0526  WBC 7.6  --  7.4 8.2 9.1  NEUTROABS 5.8  --  5.4  --   --   HGB 10.6* 12.2* 9.3* 9.5* 9.1*  HCT 37.0* 36.0* 32.2* 33.0* 32.7*  MCV 92.3  --  90.4 89.9 92.6  PLT 350  --  345 233 367    LFT Recent Labs  Lab 06/11/2022 2000 06/16/22 0459  AST 21 19  ALT 10 8  ALKPHOS 67 70  BILITOT 1.4* 0.7  PROT 6.5 6.0*  ALBUMIN 2.5* 2.3*     Antibiotics: Anti-infectives (From admission, onward)    Start     Dose/Rate Route Frequency Ordered Stop   06/17/22  1430  doxycycline (VIBRA-TABS) tablet 100 mg        100 mg Oral Every 12 hours 06/17/22 1335 06/20/22 0959   06/10/2022 2145  cefTRIAXone (ROCEPHIN) 2 g in sodium chloride 0.9 % 100 mL IVPB        2 g 200 mL/hr over 30 Minutes Intravenous Every 24 hours 06/14/2022 2142 06/20/22 2159   06/22/2022 2145  azithromycin (ZITHROMAX) 500 mg in sodium chloride 0.9 % 250 mL IVPB  Status:  Discontinued        500 mg 250 mL/hr over 60 Minutes Intravenous Every 24 hours 06/21/2022 2142 06/17/22 1335        DVT prophylaxis: SCDs  Code Status: Full code  Family Communication: No family at bedside   CONSULTS palliative care   Objective    Physical Examination:  General-appears in no acute distress Heart-S1-S2, regular, no murmur auscultated Lungs-decreased breath sounds at lung bases Abdomen-soft, nontender, no organomegaly Extremities-no edema in the lower extremities Neuro-alert, oriented x3, no focal deficit noted   Status is: Inpatient:             [name]   [facility] If 7PM-7AM, please contact night-coverage at [email or web address], Office  [phone]   06/19/2022, 3:16 PM  LOS: 4 days

## 2022-06-19 NOTE — TOC Progression Note (Signed)
Transition of Care [facility]) - Progression Note    Patient Details  Name: Robert Hamilton MRN: [id number] Date of Birth: 08/09/1934  [facility] CM/SW Contact  Reece Agar, [place] Phone Number: 06/19/2022, 4:14 PM  Clinical Narrative:     CSW spoke with pt about Snf, pt refused Snf. CSW asked pt about HH coming out to his home for PT, pt stated he does not need any help and does not want anyone to come to his home.  CSW will sign off unless there are any further CSW needs.         Expected Discharge Plan and Services                                                 Social Determinants of Health (SDOH) Interventions    Readmission Risk Interventions     No data to display

## 2022-06-19 NOTE — Consult Note (Signed)
Consultation Note Date: 06/19/2022   Patient Name: Robert Hamilton  DOB: May 05, 1934  MRN: [id number]  Age / Sex: 88 y.o., male  PCP: Maryella Shivers, MD Referring Physician: [name], MD  Reason for Consultation: Establishing goals of care  HPI/Patient Profile: 88 y.o. male  with past medical history of worsening metastatic lung cancer with burden to the brain, paroxysmal A-fib not on anticoagulation in the setting of history of GI bleed May of this year, chronic systolic/diastolic heart failure, anemia, CKD, HTN, admitted on 06/17/2022 with A-fib with RVR.   Clinical Assessment and Goals of Care: I have reviewed medical records including EPIC notes, labs and imaging, received report from RN, assessed the patient.  [name], [place], is lying quietly in bed.  He greets me, making and mostly keeping eye contact.  He is alert and oriented, able to make his needs known.  His son-in-law, Eddie Dibbles, is present at bedside.  We meet at bedside to discuss diagnosis prognosis, GOC, EOL wishes, disposition and options.  I introduced Palliative Medicine as specialized medical care for people living with serious illness. It focuses on providing relief from the symptoms and stress of a serious illness. The goal is to improve quality of life for both the patient and the family.  We focused on their current illness.  We talked about his irregular heartbeat, A-fib.  We talk about the medical treatment plan.  No questions at this time.  We talked about his metastatic cancer burden.  He tells me that his last chemo treatment was very difficult and really took a toll on him.  He is to follow-up outpatient with oncology.  We talked about the benefits of short-term rehab.  At this point, he states that he would except short-term rehab.  He talks about his declining functional status over the last few months.  He states that he has been  needing to use a walker for several years now.  The natural disease trajectory and expectations at EOL were discussed.  Advanced directives, concepts specific to code status, artifical feeding and hydration, and rehospitalization were considered and discussed.  Palliative Care services outpatient were explained and offered.  We talked about the benefits of outpatient palliative services for further discussions about the "what if's, and maybe's".  At this point, [name] states that he would like to speak with his daughter who helps with decision-making.  I encouraged him to talk with his daughter and his doctors.  Discussed the importance of continued conversation with family and the medical providers regarding overall plan of care and treatment options, ensuring decisions are within the context of the patient's values and GOCs.  Questions and concerns were addressed.  Hard Choices booklet left for review.  The patient was encouraged to call with questions or concerns.  PMT will continue to support holistically.  Conference with attending, bedside nursing staff, transition of care team related to patient condition, needs, goals of care, disposition.   HCPOA NEXT OF KIN -at this point [name] is able to  make his own choices, but his daughter, Forde Radon, assist with decision making    SUMMARY OF RECOMMENDATIONS   At this point continue to treat the treatable but no CPR or intubation Agreeable to short-term rehab Considering outpatient palliative services Follow-up with oncology outpatient.   Code Status/Advance Care Planning: DNR  Symptom Management:  Per hospitalist, no additional needs at this time.  Palliative Prophylaxis:  Oral Care  Additional Recommendations (Limitations, Scope, Preferences): Continue to treat the treatable but no CPR or intubation.  Psycho-social/Spiritual:  Desire for further Chaplaincy support:yes Additional Recommendations: Caregiving   Support/Resources  Prognosis:  Unable to determine, based on outcomes.  6 to 12 months would not be surprising based on chronic illness burden, 3 hospital visits in the last 6 months, decreasing functional status.  Discharge Planning:  Agreeable to short-term rehab with ultimate goal of returning home.  No palliative outpatient care at this time.       Primary Diagnoses: Present on Admission:  Atrial fibrillation with RVR ([place])  CAP (community acquired pneumonia)  Lactic acidosis  AKI (acute kidney injury) ([place])  Acute respiratory failure with hypoxia (HCC)  Chronic combined systolic and diastolic heart failure ([place])   I have reviewed the medical record, interviewed the patient and family, and examined the patient. The following aspects are pertinent.  Past Medical History:  Diagnosis Date   Anemia    Anemia 09/27/2021   Cancer (HCC)    squamous cell cancer of left lung, with mets to brain   CHF (congestive heart failure) (HCC)    Chronic kidney disease    COPD (chronic obstructive pulmonary disease) (HCC)    Diverticulosis    GERD (gastroesophageal reflux disease)    Hypertension    Metastasis to brain ([place]) 03/20/2022   Persistent atrial fibrillation (HCC)    Social History   Socioeconomic History   Marital status: Divorced    Spouse name: Not on file   Number of children: 2   Years of education: Not on file   Highest education level: Not on file  Occupational History   Occupation: retired  Tobacco Use   Smoking status: Former    Packs/day: 0.25    Types: Cigarettes    Quit date: 12/2020    Years since quitting: 1.5   Smokeless tobacco: Never   Tobacco comments:    It's been a while  Vaping Use   Vaping Use: Never used  Substance and Sexual Activity   Alcohol use: Not Currently    Comment: stopped 12/2018, h/o heavy use   Drug use: No   Sexual activity: Yes  Other Topics Concern   Not on file  Social History Narrative   Not on file   Social  Determinants of Health   Financial Resource Strain: Not on file  Food Insecurity: Not on file  Transportation Needs: Not on file  Physical Activity: Not on file  Stress: Not on file  Social Connections: Not on file   Family History  Problem Relation Age of Onset   Heart attack Mother    Hypertension Mother    Hypertension Father    Hypertension Sister    Hypertension Sister    Hypertension Sister    Hypertension Sister    Scheduled Meds:  Chlorhexidine Gluconate Cloth  6 each Topical Daily   doxycycline  100 mg Oral Q12H   metoprolol succinate  50 mg Oral Daily   sodium chloride flush  10-40 mL Intracatheter Q12H   Continuous Infusions:  cefTRIAXone (  ROCEPHIN)  IV Stopped (06/18/22 2156)   diltiazem (CARDIZEM) infusion 7.5 mg/hr (06/19/22 1200)   PRN Meds:.acetaminophen **OR** acetaminophen, guaiFENesin-dextromethorphan, levalbuterol, lidocaine (PF), loperamide, melatonin, mouth rinse, sodium chloride flush Medications Prior to Admission:  Prior to Admission medications   Medication Sig Start Date End Date Taking? Authorizing Provider  acetaminophen (TYLENOL) 325 MG tablet Take 2 tablets (650 mg total) by mouth every 6 (six) hours as needed for mild pain or fever (or Fever >/= 101). Patient not taking: Reported on 05/30/2022 06/28/21   Barb Merino, MD  albuterol (VENTOLIN HFA) 108 (90 Base) MCG/ACT inhaler 2 puffs every 6 (six) hours as needed. Shortness of breath Patient not taking: Reported on 04/24/2022 06/05/21   [provider]  allopurinol (ZYLOPRIM) 100 MG tablet Take 100 mg by mouth every morning. Patient not taking: Reported on 05/30/2022 04/26/21   [provider]  dexamethasone (DECADRON) 4 MG tablet One tab po TID Patient not taking: Reported on 05/30/2022 01/18/22   [name], MD  ENTRESTO 24-26 MG TAKE 1 TABLET BY MOUTH TWICE DAILY Patient not taking: Reported on 05/30/2022 05/06/22   [name], MD  metoprolol succinate (TOPROL-XL)  25 MG 24 hr tablet TAKE 1/2 TABLET(12.5 MG) BY MOUTH DAILY Patient not taking: Reported on 05/30/2022 03/18/22   [name], MD  OXYGEN Inhale into the lungs as needed (shortness of breath). Patient not taking: Reported on 05/30/2022    [provider]   Allergies  Allergen Reactions   Paclitaxel Shortness Of Breath   Review of Systems  Unable to perform ROS: Other    Physical Exam Vitals and nursing note reviewed.  Constitutional:      General: He is not in acute distress.    Appearance: He is ill-appearing.  Cardiovascular:     Rate and Rhythm: Normal rate.  Pulmonary:     Effort: Pulmonary effort is normal. No tachypnea.  Skin:    General: Skin is warm and dry.  Neurological:     Mental Status: He is alert and oriented to person, place, and time.  Psychiatric:        Mood and Affect: Mood normal.        Behavior: Behavior normal.     Vital Signs: BP 124/74 (BP Location: Right Arm)   Pulse 83   Temp 97.6 F (36.4 C) (Axillary)   Resp 11   Ht _0  (1.753 m)   Wt 66.5 kg Comment: Pt refuses to do standing weight, bed weight used instead  SpO2 94%   BMI 21.65 kg/m  Pain Scale: 0-10 POSS *See Group Information*: 1-Acceptable,Awake and alert Pain Score: 0-No pain   SpO2: SpO2: 94 % O2 Device:SpO2: 94 % O2 Flow Rate: .O2 Flow Rate (L/min): 5 L/min  IO: Intake/output summary:  Intake/Output Summary (Last 24 hours) at 06/19/2022 1253 Last data filed at 06/19/2022 1200 Gross per 24 hour  Intake 758.37 ml  Output --  Net 758.37 ml    LBM: Last BM Date : 06/18/22 Baseline Weight: Weight: 65 kg Most recent weight: Weight: 66.5 kg (Pt refuses to do standing weight, bed weight used instead)     Palliative Assessment/Data:   Flowsheet Rows    Flowsheet Row Most Recent Value  Intake Tab   Referral Department Hospitalist  Unit at Time of Referral Intermediate Care Unit  Palliative Care Primary Diagnosis Cardiac  Date Notified 06/18/22   Palliative Care Type Return patient Palliative Care  Reason for referral Clarify Goals of Care  Date of Admission 06/19/2022  Date first seen by Palliative Care 06/19/22  # of days Palliative referral response time 1 Day(s)  # of days IP prior to Palliative referral 3  Clinical Assessment   Palliative Performance Scale Score 40%  Pain Max last 24 hours Not able to report  Pain Min Last 24 hours Not able to report  Dyspnea Max Last 24 Hours Not able to report  Dyspnea Min Last 24 hours Not able to report  Psychosocial & Spiritual Assessment   Palliative Care Outcomes        Time In: 1030 Time Out: 1145 Time Total: 75 minutes  Greater than 50%  of this time was spent counseling and coordinating care related to the above assessment and plan.  Signed by: [name] Novel, NP   Please contact Palliative Medicine Team phone at [phone] for questions and concerns.  For individual provider: See Shea Evans

## 2022-06-19 NOTE — Progress Notes (Signed)
Heart rate is now on the 60's and 70's a-fib. Cardizem gtt rate reduced to 5 ml.hr. continue to monitor.

## 2022-06-19 NOTE — Progress Notes (Signed)
Notified by CMT that  pt has 2.1 sec. pause . MD made aware. Cardizem gtt discontinued .

## 2022-06-20 DIAGNOSIS — Z7189 Other specified counseling: Secondary | ICD-10-CM | POA: Diagnosis not present

## 2022-06-20 DIAGNOSIS — I4891 Unspecified atrial fibrillation: Secondary | ICD-10-CM | POA: Diagnosis not present

## 2022-06-20 DIAGNOSIS — Z515 Encounter for palliative care: Secondary | ICD-10-CM | POA: Diagnosis not present

## 2022-06-20 DIAGNOSIS — J189 Pneumonia, unspecified organism: Secondary | ICD-10-CM | POA: Diagnosis not present

## 2022-06-20 DIAGNOSIS — N179 Acute kidney failure, unspecified: Secondary | ICD-10-CM | POA: Diagnosis not present

## 2022-06-20 DIAGNOSIS — I5042 Chronic combined systolic (congestive) and diastolic (congestive) heart failure: Secondary | ICD-10-CM | POA: Diagnosis not present

## 2022-06-20 LAB — BASIC METABOLIC PANEL
Anion gap: 12 (ref 5–15)
BUN: 25 mg/dL — ABNORMAL HIGH (ref 8–23)
CO2: 18 mmol/L — ABNORMAL LOW (ref 22–32)
Calcium: 8.6 mg/dL — ABNORMAL LOW (ref 8.9–10.3)
Chloride: 112 mmol/L — ABNORMAL HIGH (ref 98–111)
Creatinine, Ser: 2.36 mg/dL — ABNORMAL HIGH (ref 0.61–1.24)
GFR, Estimated: 26 mL/min — ABNORMAL LOW (ref >60)
Glucose, Bld: 91 mg/dL (ref 70–99)
Potassium: 4.8 mmol/L (ref 3.5–5.1)
Sodium: 142 mmol/L (ref 135–145)

## 2022-06-20 LAB — CULTURE, BLOOD (ROUTINE X 2): Culture: NO GROWTH

## 2022-06-20 MED ORDER — LACTATED RINGERS IV SOLN: INTRAVENOUS | Status: AC

## 2022-06-20 MED ORDER — SODIUM CHLORIDE 0.9 % IV SOLN: INTRAVENOUS | Status: DC

## 2022-06-20 NOTE — Plan of Care (Signed)
  Problem: Activity: Goal: Risk for activity intolerance will decrease Outcome: Progressing   Problem: Elimination: Goal: Will not experience complications related to bowel motility Outcome: Progressing Goal: Will not experience complications related to urinary retention Outcome: Progressing   Problem: Nutrition: Goal: Adequate nutrition will be maintained Outcome: Not Progressing   Problem: Coping: Goal: Level of anxiety will decrease Outcome: Not Progressing

## 2022-06-20 NOTE — Progress Notes (Signed)
[facility]  PROGRESS NOTE  Robert Hamilton PIR:[id number] DOB: 1934-03-14 DOA: 06/03/2022 PCP: Maryella Shivers, MD   Brief HPI:   88 year old male with history of metastatic cancer to brain, paroxysmal atrial fibrillation not on anticoagulation in setting of GI bleed, chronic systolic/diastolic CHF who presented from home with cough, fast heartbeat, chills and shortness of breath.  Patient was recently hospitalized in May for acute lower GI bleed.  During this admission patient was found to be in A-fib with RVR, started on Cardizem drip.  Also found to have airspace opacities in right lung base as well as left perihilar region concerning for pneumonia.  Patient started on antibiotics.    Subjective   Patient seen and examined, stated that he feels weak.  Wants to go to skilled nursing facility for rehab.   Assessment/Plan:    Atrial fibrillation with RVR -Started on Cardizem gtt. which has been discontinued -Not on anticoagulation due to history of GI bleed; CHA2DS2-VASc score of 5 -Also has mets to brain, likely not a candidate for anticoagulation -Continue metoprolol 50 mg daily  Sepsis/community-acquired pneumonia -Presents with fever, chills, elevated lactate on presentation -Chest x-ray showed possible pneumonia in the right lung base -Started on ceftriaxone/doxycycline; today's last day of antibiotics -He is on oxygen 2 L/min as needed at home -CT chest without contrast done for the follow-up exam showed moderate right-sided pleural effusion, S/P  thoracentesis.  It also showed progression of left hilar mass.  Recent history of lower GI bleed -Felt to be due to hemorrhoidal bleeding -Hemoglobin stable at 9.1  Metastatic left lung cancer -Found to have multiple brain metastasis s/p stereotactic radiosurgery -Palliative care consulted for poor prognosis -CT imaging showed progressive left hilar mass  Chronic combined systolic/diastolic CHF -Entresto was held  due to AKI -Echo done on 04/26/2021 showed EF of 35 to 63%, grade 2 diastolic dysfunction  Acute kidney injury on CKD stage III a -Baseline creatinine 0.9-1.2 -Creatinine has been fluctuating over past few days -This morning creatinine is elevated at 2.36, BUN 25 -We will start IV normal saline at 75 mill per hour for 12 hours -Follow BMP in am  Hypertension -Continue Entresto, metoprolol  History of gout -Continue allopurinol  Disposition -Plan to go to skilled nursing facility rehab   Medications     Chlorhexidine Gluconate Cloth  6 each Topical Daily   divalproex  250 mg Oral Q12H   metoprolol succinate  50 mg Oral Daily   sodium chloride flush  10-40 mL Intracatheter Q12H     Data Reviewed:   CBG:  No results for input(s): "GLUCAP" in the last 168 hours.  SpO2: 93 % O2 Flow Rate (L/min): 5 L/min    Vitals:   06/20/22 0318 06/20/22 0800 06/20/22 0933 06/20/22 1200  BP: 101/73 111/69  (!) 115/103  Pulse: 74 72 83 84  Resp:  20  16  Temp: 98.5 F (36.9 C) 98.6 F (37 C)  98.4 F (36.9 C)  TempSrc: Axillary Oral  Oral  SpO2:  92%  93%  Weight: 67 kg     Height:          Data Reviewed:  Basic Metabolic Panel: Recent Labs  Lab 06/24/2022 2000 06/30/2022 2014 06/16/22 0459 06/17/22 0052 06/18/22 0305 06/19/22 0526 06/20/22 0828  NA 143   < > 141 140 142 140 142  K 3.9   < > 3.8 4.8 3.4* 4.7 4.8  CL 106   < > 109 106 109 113*  112*  CO2 17*  --  18* 19* 22 17* 18*  GLUCOSE 128*   < > 106* 127* 100* 108* 91  BUN 13   < > 11 11 11 14  25*  CREATININE 1.51*   < > 1.24 1.67* 1.21 1.73* 2.36*  CALCIUM 8.8*  --  8.7* 8.7* 8.4* 8.7* 8.6*  MG 1.9  --  2.2  --   --   --   --   PHOS  --   --  3.0  --   --   --   --    < > = values in this interval not displayed.    CBC: Recent Labs  Lab 06/25/2022 2000 06/12/2022 2014 06/16/22 0459 06/17/22 0052 06/19/22 0526  WBC 7.6  --  7.4 8.2 9.1  NEUTROABS 5.8  --  5.4  --   --   HGB 10.6* 12.2* 9.3* 9.5* 9.1*   HCT 37.0* 36.0* 32.2* 33.0* 32.7*  MCV 92.3  --  90.4 89.9 92.6  PLT 350  --  345 233 367    LFT Recent Labs  Lab 06/25/2022 2000 06/16/22 0459  AST 21 19  ALT 10 8  ALKPHOS 67 70  BILITOT 1.4* 0.7  PROT 6.5 6.0*  ALBUMIN 2.5* 2.3*     Antibiotics: Anti-infectives (From admission, onward)    Start     Dose/Rate Route Frequency Ordered Stop   06/17/22 1430  doxycycline (VIBRA-TABS) tablet 100 mg        100 mg Oral Every 12 hours 06/17/22 1335 06/20/22 0959   07/01/2022 2145  cefTRIAXone (ROCEPHIN) 2 g in sodium chloride 0.9 % 100 mL IVPB        2 g 200 mL/hr over 30 Minutes Intravenous Every 24 hours 06/22/2022 2142 06/20/22 2159   06/17/2022 2145  azithromycin (ZITHROMAX) 500 mg in sodium chloride 0.9 % 250 mL IVPB  Status:  Discontinued        500 mg 250 mL/hr over 60 Minutes Intravenous Every 24 hours 06/29/2022 2142 06/17/22 1335        DVT prophylaxis: SCDs  Code Status: Full code  Family Communication: No family at bedside   CONSULTS palliative care   Objective    Physical Examination:  General-appears in no acute distress Heart-S1-S2, regular, no murmur auscultated Lungs-clear to auscultation bilaterally, no wheezing or crackles auscultated Abdomen-soft, nontender, no organomegaly Extremities-no edema in the lower extremities Neuro-alert, oriented x3, no focal deficit noted   Status is: Inpatient:          [name]   [facility] If 7PM-7AM, please contact night-coverage at [email or web address], Office  [phone]   06/20/2022, 1:23 PM  LOS: 5 days

## 2022-06-20 NOTE — Progress Notes (Signed)
Palliative: Mr. Evlyn Courier, is resting quietly in bed.  He greets me, making and somewhat keeping eye contact.  He appears chronically ill and somewhat frail.  He is alert and oriented x3, able to make his basic needs known.  There is no family present at bedside at this time.  We talk about how he is doing.  [name] tells me that he had a rough night, and he is tired and sleepy.  We talk about his heart rate control, and the treatment plan.  We talk about his weakness.  He has been using a walker for several years now.  At this point, he tells me that he is agreeable to go to short-term rehab.  Unfortunately, yesterday, he told staff that he would return home.  I encouraged him to work closely with his daughter [name] for outcomes.  [name] tells me that [name] has been trying to get him to go to assisted living for quite some time.  I shared that rehab would be for short-term.  He agrees.  He request that we call [name] for choice of rehab.  Conference with attending, bedside nursing staff, transition of care team related to patient condition, needs, goals of care, disposition.  Plan:   At this point continue to treat the treatable but no CPR or intubation.  Time for outcomes.  Agreeable to short-term rehab.  Please reach out to daughter, [name], for assistance in placement.   78 minutes  Quinn Axe, NP Palliative medicine team Team phone [phone] Greater than 50% of this time was spent counseling and coordinating care related to the above assessment and plan.

## 2022-06-21 DIAGNOSIS — I4891 Unspecified atrial fibrillation: Secondary | ICD-10-CM | POA: Diagnosis not present

## 2022-06-21 DIAGNOSIS — J189 Pneumonia, unspecified organism: Secondary | ICD-10-CM | POA: Diagnosis not present

## 2022-06-21 DIAGNOSIS — I5042 Chronic combined systolic (congestive) and diastolic (congestive) heart failure: Secondary | ICD-10-CM | POA: Diagnosis not present

## 2022-06-21 DIAGNOSIS — N179 Acute kidney failure, unspecified: Secondary | ICD-10-CM | POA: Diagnosis not present

## 2022-06-21 LAB — BASIC METABOLIC PANEL
Anion gap: 12 (ref 5–15)
BUN: 27 mg/dL — ABNORMAL HIGH (ref 8–23)
CO2: 20 mmol/L — ABNORMAL LOW (ref 22–32)
Calcium: 8.6 mg/dL — ABNORMAL LOW (ref 8.9–10.3)
Chloride: 110 mmol/L (ref 98–111)
Creatinine, Ser: 2.03 mg/dL — ABNORMAL HIGH (ref 0.61–1.24)
GFR, Estimated: 32 mL/min — ABNORMAL LOW (ref >60)
Glucose, Bld: 79 mg/dL (ref 70–99)
Potassium: 4.6 mmol/L (ref 3.5–5.1)
Sodium: 142 mmol/L (ref 135–145)

## 2022-06-21 LAB — CULTURE, BLOOD (ROUTINE X 2)
Culture: NO GROWTH
Special Requests: ADEQUATE

## 2022-06-21 MED ORDER — SODIUM CHLORIDE 0.9 % IV SOLN
INTRAVENOUS | Status: AC
Start: 2022-06-21 — End: 2022-06-21

## 2022-06-21 NOTE — TOC Initial Note (Signed)
Transition of Care [facility]) - Initial/Assessment Note    Patient Details  Name: Robert Hamilton MRN: [id number] Date of Birth: 1934-08-22  Transition of [facility]) CM/SW Contact:    Tresa Endo Phone Number: 06/21/2022, 4:23 PM  Clinical Narrative:                 CSW received SNF consult. CSW met with pt and daughter at bedside. CSW introduced self and explained role at the hospital. Pt reports that PTA the pt lives home alone. PT reports pt is MaxA.  CSW reviewed PT/OT recommendations for SNF. Pt reports although he declined at first he will try SNF. Pt gave CSW permission to fax out to facilities in the area. Pt has no preference of facility at this time.   CSW will continue to follow.          Patient Goals and CMS Choice        Expected Discharge Plan and Services                                                Prior Living Arrangements/Services                       Activities of Daily Living Home Assistive Devices/Equipment: Walker (specify type), Oxygen ADL Screening (condition at time of admission) Patient's cognitive ability adequate to safely complete daily activities?: Yes Is the patient deaf or have difficulty hearing?: No Does the patient have difficulty seeing, even when wearing glasses/contacts?: Yes Does the patient have difficulty concentrating, remembering, or making decisions?: Yes Patient able to express need for assistance with ADLs?: Yes Does the patient have difficulty dressing or bathing?: Yes Independently performs ADLs?: No Communication: Independent Dressing (OT): Needs assistance, Appropriate for developmental age Is this a change from baseline?: Change from baseline, expected to last <3days Grooming: Appropriate for developmental age Feeding: Independent Bathing: Appropriate for developmental age, Needs assistance Is this a change from baseline?: Change from baseline, expected to last <3 days Toileting:  Independent with device (comment) In/Out Bed: Needs assistance Is this a change from baseline?: Change from baseline, expected to last <3 days Walks in Home: Independent with device (comment) Does the patient have difficulty walking or climbing stairs?: Yes Weakness of Legs: None Weakness of Arms/Hands: None  Permission Sought/Granted                  Emotional Assessment              Admission diagnosis:  Shortness of breath [R06.02] Atrial fibrillation with RVR ([place]) [I48.91] Community acquired pneumonia, unspecified laterality [J18.9] Patient Active Problem List   Diagnosis Date Noted   Lactic acidosis 06/16/2022   Acute respiratory failure with hypoxia ([place]) 06/16/2022   Atrial fibrillation with RVR ([place]) 06/03/2022   Hematochezia    Acute lower GI bleeding 04/24/2022   Chronic kidney disease, stage 3a ([place]) 04/24/2022   DNR (do not resuscitate) 04/24/2022   Closed fracture of other facial bone 03/25/2022   Metastasis to brain ([place]) 03/20/2022   CAP (community acquired pneumonia) 01/12/2022   Orthostatic hypotension    Persistent atrial fibrillation ([place]) 12/27/2021   Anemia due to antineoplastic chemotherapy 12/09/2021   Anemia 09/27/2021   Malignant neoplasm of overlapping sites of left lung ([place]) 09/11/2021   Metastasis to bone ([place]) 06/27/2021  Protein-calorie malnutrition, severe 06/27/2021   Squamous cell lung cancer, left ([place])    Pain due to malignant neoplasm metastatic to bone ([place]) 06/25/2021   Pulmonary embolism ([place]) 06/25/2021   Hypercalcemia of malignancy 06/25/2021   Chronic combined systolic and diastolic heart failure ([place]) 07/19/2019   Nonrheumatic mitral valve regurgitation 07/19/2019   Thrombocytopenia ([place]) 03/23/2019   Diverticulosis of colon with hemorrhage    Acute blood loss anemia    AKI (acute kidney injury) ([place]) 03/22/2019   History of prostate cancer 03/22/2019   NSVT (nonsustained ventricular tachycardia) ([place]) 03/22/2019    GIB (gastrointestinal bleeding) 03/21/2019   Lower GI bleeding 11/19/2018   Hepatic lesion 05/22/2017   History of diverticulitis 05/22/2017   History of GI bleed 05/22/2017   Prostate cancer ([place]) 05/22/2017   GERD (gastroesophageal reflux disease) 05/22/2017   Normocytic anemia 05/22/2017   Essential hypertension 05/22/2017   Chronic kidney disease 05/22/2017   Cigarette smoker 05/22/2017   PCP:  Maryella Shivers, MD Pharmacy:   [facility] [place], [place] - [address] [place] [address] Phone: [phone] Fax: [phone]     Social Determinants of Health (SDOH) Interventions    Readmission Risk Interventions     No data to display

## 2022-06-21 NOTE — Progress Notes (Signed)
Physical Therapy Treatment Patient Details Name: Robert Hamilton MRN: [id number] DOB: 1934/01/11 Today's Date: 06/21/2022   History of Present Illness 88 yo male admitted 7/15 with increased HR and SOB. Pt with Afib with RVR, hypoxia, CAP and left lung mass. 7/18 thoracentesis. PMhx: met CA to the brain, paroxysmal Afib not on anticoagulation, CHF    PT Comments    Pt received supine and despite hesitancy, agreeable to session. Pt able min assist for all bed mobility to elevate trunk and manage BLE. Pt able to come to standing with RW x3 throughout session with first trial able to maintain ~30 seconds and perform marching in place before abruptly coming to sit EOB secondary to fatigue. Pt performing x2 pivots with min assist to manage lines, RW and guide hips. Encouraged pt to transfer to [facility] throughout day as opposed to use bedpan with RN and NT aware and agreeable. PT declining ambulation and further exercise secondary to fatigue. Pt continues to benefit from skilled PT services to progress toward functional mobility goals.   SpO2 87-95% on 2L HR 95-121   Recommendations for follow up therapy are one component of a multi-disciplinary discharge planning process, led by the attending physician.  Recommendations may be updated based on patient status, additional functional criteria and insurance authorization.  Follow Up Recommendations  Skilled nursing-short term rehab (<3 hours/day) Can patient physically be transported by private vehicle: Yes   Assistance Recommended at Discharge Frequent or constant Supervision/Assistance  Patient can return home with the following A little help with walking and/or transfers;Assistance with cooking/housework;A little help with bathing/dressing/bathroom;Direct supervision/assist for financial management;Direct supervision/assist for medications management;Assist for transportation   Equipment Recommendations  BSC/3in1    Recommendations for Other  Services       Precautions / Restrictions Precautions Precautions: Fall;Other (comment) Precaution Comments: watch sats, incontinent of stool Restrictions Weight Bearing Restrictions: No     Mobility  Bed Mobility Overal bed mobility: Needs Assistance Bed Mobility: Supine to Sit, Sit to Supine     Supine to sit: Min assist Sit to supine: Min assist   General bed mobility comments: Pt able to come to sitting with min assis tto elevate trunk and return BLE to bed    Transfers Overall transfer level: Needs assistance Equipment used: Rolling walker (2 wheels) Transfers: Sit to/from Stand, Bed to chair/wheelchair/BSC Sit to Stand: Min assist Stand pivot transfers: Min assist         General transfer comment: min assist to power up to standing from EOB and BSC, and to pivot EOB<>BSC, pt able to stand for x2 trials and march in place during first trial for short bout before sitting abruptly    Ambulation/Gait               General Gait Details: pt denied attempting   Stairs             Wheelchair Mobility    Modified Rankin (Stroke Patients Only)       Balance Overall balance assessment: Needs assistance Sitting-balance support: Feet supported, Bilateral upper extremity supported Sitting balance-Leahy Scale: Fair Sitting balance - Comments: static sitting without UE support   Standing balance support: Bilateral upper extremity supported, Reliant on assistive device for balance Standing balance-Leahy Scale: Poor Standing balance comment: bil UE support in standing                            Cognition Arousal/Alertness: Awake/alert Behavior During Therapy:  Flat affect Overall Cognitive Status: No family/caregiver present to determine baseline cognitive functioning Area of Impairment: Problem solving, Safety/judgement, Attention                   Current Attention Level: Sustained     Safety/Judgement: Decreased awareness of  safety, Decreased awareness of deficits   Problem Solving: Slow processing, Requires verbal cues          Exercises      General Comments General comments (skin integrity, edema, etc.): VSS on 2L      Pertinent Vitals/Pain Pain Assessment Pain Assessment: Faces Faces Pain Scale: Hurts a little bit Pain Location: general Pain Descriptors / Indicators: Discomfort Pain Intervention(s): Monitored during session, Limited activity within patient's tolerance    Home Living                          Prior Function            PT Goals (current goals can now be found in the care plan section) Acute Rehab PT Goals Patient Stated Goal: get stronger and walk PT Goal Formulation: With patient Time For Goal Achievement: 07/01/22    Frequency    Min 3X/week      PT Plan Current plan remains appropriate    Co-evaluation              AM-PAC PT "6 Clicks" Mobility   Outcome Measure  Help needed turning from your back to your side while in a flat bed without using bedrails?: A Little Help needed moving from lying on your back to sitting on the side of a flat bed without using bedrails?: A Little Help needed moving to and from a bed to a chair (including a wheelchair)?: A Little Help needed standing up from a chair using your arms (e.g., wheelchair or bedside chair)?: A Little Help needed to walk in hospital room?: A Lot Help needed climbing 3-5 steps with a railing? : Total 6 Click Score: 15    End of Session Equipment Utilized During Treatment: Oxygen Activity Tolerance: Patient limited by fatigue Patient left: in bed;with call bell/phone within reach;with bed alarm set Nurse Communication: Mobility status PT Visit Diagnosis: Other abnormalities of gait and mobility (R26.89)     Time: 7471-8550 PT Time Calculation (min) (ACUTE ONLY): 29 min  Charges:  $Therapeutic Activity: 23-37 mins                    Moncerrath Berhe R. PTA Acute Rehabilitation  Services Office: [place] 06/21/2022, 11:56 AM

## 2022-06-21 NOTE — TOC Progression Note (Signed)
Transition of Care [facility]) - Progression Note    Patient Details  Name: Robert Hamilton MRN: [id number] Date of Birth: [birth year]  Transition of Care [facility]) CM/SW [place], RN Phone Number:[phone]  06/21/2022, 1:08 PM  Clinical Narrative:    MD acknowledges that MD has entered new referral for SNF. SW has previously seen patient and patient refused SNF and HH. CM will alert SW of new consult and follow up with the patient.         Expected Discharge Plan and Services                                                 Social Determinants of Health (SDOH) Interventions    Readmission Risk Interventions     No data to display

## 2022-06-21 NOTE — Progress Notes (Signed)
[facility]  PROGRESS NOTE  Robert Hamilton FTD:[id number] DOB: [birth year] DOA: 06/30/2022 PCP: Robert Shivers, MD   Brief HPI:   88 year old male with history of metastatic cancer to brain, paroxysmal atrial fibrillation not on anticoagulation in setting of GI bleed, chronic systolic/diastolic CHF who presented from home with cough, fast heartbeat, chills and shortness of breath.  Patient was recently hospitalized in May for acute lower GI bleed.  During this admission patient was found to be in A-fib with RVR, started on Cardizem drip.  Also found to have airspace opacities in right lung base as well as left perihilar region concerning for pneumonia.  Patient started on antibiotics.    Subjective   Patient seen and examined, denies shortness of breath.  Now wants to go to rehab.   Assessment/Plan:    Atrial fibrillation with RVR -Started on Cardizem gtt. which has been discontinued -Not on anticoagulation due to history of GI bleed; CHA2DS2-VASc score of 5 -Also has mets to brain, likely not a candidate for anticoagulation -Continue metoprolol 50 mg daily  Sepsis/community-acquired pneumonia -Presents with fever, chills, elevated lactate on presentation -Chest x-ray showed possible pneumonia in the right lung base -Started on ceftriaxone/doxycycline; today's last day of antibiotics -He is on oxygen 2 L/min as needed at home -CT chest without contrast done for the follow-up exam showed moderate right-sided pleural effusion, S/P  thoracentesis.  It also showed progression of left hilar mass.  Recent history of lower GI bleed -Felt to be due to hemorrhoidal bleeding -Hemoglobin stable at 9.1  Metastatic left lung cancer -Found to have multiple brain metastasis s/p stereotactic radiosurgery -Palliative care consulted for poor prognosis -CT imaging showed progressive left hilar mass  Chronic combined systolic/diastolic CHF -Entresto was held due to AKI -Echo done on  04/26/2021 showed EF of 35 to 06%, grade 2 diastolic dysfunction  Acute kidney injury on CKD stage III a -Baseline creatinine 0.9-1.2 -Creatinine has been fluctuating over past few days -This morning creatinine is elevated at 2.36, BUN 25 -Creatinine improved to 2.03 with IV fluids -We will continue with  IV normal saline at 75 mill per hour for 12 hours -Follow BMP in am  Hypertension -Continue Entresto, metoprolol  History of gout -Continue allopurinol  Disposition -Plan to go to skilled nursing facility rehab   Medications     Chlorhexidine Gluconate Cloth  6 each Topical Daily   divalproex  250 mg Oral Q12H   metoprolol succinate  50 mg Oral Daily   sodium chloride flush  10-40 mL Intracatheter Q12H     Data Reviewed:   CBG:  No results for input(s): "GLUCAP" in the last 168 hours.  SpO2: 96 % O2 Flow Rate (L/min): 5 L/min    Vitals:   06/20/22 2256 06/21/22 0437 06/21/22 0508 06/21/22 0716  BP: 121/78 123/86  (!) 116/92  Pulse: 96 89    Resp: 10 20  20   Temp: (!) 97.1 F (36.2 C) 97.9 F (36.6 C)  97.8 F (36.6 C)  TempSrc: Axillary Axillary  Oral  SpO2: 97% 98%  96%  Weight:   66.5 kg   Height:          Data Reviewed:  Basic Metabolic Panel: Recent Labs  Lab 06/25/2022 2000 06/11/2022 2014 06/16/22 0459 06/17/22 0052 06/18/22 0305 06/19/22 0526 06/20/22 0828 06/21/22 0500  NA 143   < > 141 140 142 140 142 142  K 3.9   < > 3.8 4.8 3.4* 4.7 4.8 4.6  CL 106   < > 109 106 109 113* 112* 110  CO2 17*  --  18* 19* 22 17* 18* 20*  GLUCOSE 128*   < > 106* 127* 100* 108* 91 79  BUN 13   < > 11 11 11 14  25* 27*  CREATININE 1.51*   < > 1.24 1.67* 1.21 1.73* 2.36* 2.03*  CALCIUM 8.8*  --  8.7* 8.7* 8.4* 8.7* 8.6* 8.6*  MG 1.9  --  2.2  --   --   --   --   --   PHOS  --   --  3.0  --   --   --   --   --    < > = values in this interval not displayed.    CBC: Recent Labs  Lab 06/07/2022 2000 06/05/2022 2014 06/16/22 0459 06/17/22 0052  06/19/22 0526  WBC 7.6  --  7.4 8.2 9.1  NEUTROABS 5.8  --  5.4  --   --   HGB 10.6* 12.2* 9.3* 9.5* 9.1*  HCT 37.0* 36.0* 32.2* 33.0* 32.7*  MCV 92.3  --  90.4 89.9 92.6  PLT 350  --  345 233 367    LFT Recent Labs  Lab 06/22/2022 2000 06/16/22 0459  AST 21 19  ALT 10 8  ALKPHOS 67 70  BILITOT 1.4* 0.7  PROT 6.5 6.0*  ALBUMIN 2.5* 2.3*     Antibiotics: Anti-infectives (From admission, onward)    Start     Dose/Rate Route Frequency Ordered Stop   06/17/22 1430  doxycycline (VIBRA-TABS) tablet 100 mg        100 mg Oral Every 12 hours 06/17/22 1335 06/20/22 0959   06/13/2022 2145  cefTRIAXone (ROCEPHIN) 2 g in sodium chloride 0.9 % 100 mL IVPB        2 g 200 mL/hr over 30 Minutes Intravenous Every 24 hours 06/17/2022 2142 06/20/22 2159   06/30/2022 2145  azithromycin (ZITHROMAX) 500 mg in sodium chloride 0.9 % 250 mL IVPB  Status:  Discontinued        500 mg 250 mL/hr over 60 Minutes Intravenous Every 24 hours 06/28/2022 2142 06/17/22 1335        DVT prophylaxis: SCDs  Code Status: Full code  Family Communication: No family at bedside   CONSULTS palliative care   Objective    Physical Examination:  General-appears in no acute distress Heart-S1-S2, regular, no murmur auscultated Lungs-clear to auscultation bilaterally, no wheezing or crackles auscultated Abdomen-soft, nontender, no organomegaly Extremities-no edema in the lower extremities Neuro-alert, oriented x3, no focal deficit noted   Status is: Inpatient:          [name]   [facility] If 7PM-7AM, please contact night-coverage at [email or web address], Office  [phone]   06/21/2022, 8:00 AM  LOS: 6 days

## 2022-06-22 DIAGNOSIS — I4891 Unspecified atrial fibrillation: Secondary | ICD-10-CM | POA: Diagnosis not present

## 2022-06-22 DIAGNOSIS — Z7189 Other specified counseling: Secondary | ICD-10-CM | POA: Diagnosis not present

## 2022-06-22 DIAGNOSIS — I5042 Chronic combined systolic (congestive) and diastolic (congestive) heart failure: Secondary | ICD-10-CM | POA: Diagnosis not present

## 2022-06-22 DIAGNOSIS — Z515 Encounter for palliative care: Secondary | ICD-10-CM | POA: Diagnosis not present

## 2022-06-22 LAB — BASIC METABOLIC PANEL
Anion gap: 8 (ref 5–15)
BUN: 23 mg/dL (ref 8–23)
CO2: 20 mmol/L — ABNORMAL LOW (ref 22–32)
Calcium: 8.2 mg/dL — ABNORMAL LOW (ref 8.9–10.3)
Chloride: 111 mmol/L (ref 98–111)
Creatinine, Ser: 1.55 mg/dL — ABNORMAL HIGH (ref 0.61–1.24)
GFR, Estimated: 44 mL/min — ABNORMAL LOW (ref >60)
Glucose, Bld: 89 mg/dL (ref 70–99)
Potassium: 4.4 mmol/L (ref 3.5–5.1)
Sodium: 139 mmol/L (ref 135–145)

## 2022-06-22 LAB — GLUCOSE, CAPILLARY: Glucose-Capillary: 89 mg/dL (ref 70–99)

## 2022-06-22 MED ORDER — LACTATED RINGERS IV SOLN: INTRAVENOUS | Status: AC

## 2022-06-22 MED ORDER — DILTIAZEM HCL-DEXTROSE 125-5 MG/125ML-% IV SOLN (PREMIX)
5.0000 mg/h | INTRAVENOUS | Status: DC
  Administered 2022-06-22: 5 mg/h via INTRAVENOUS
  Filled 2022-06-22: qty 125

## 2022-06-22 MED ORDER — LORAZEPAM 2 MG/ML IJ SOLN
0.5000 mg | Freq: Once | INTRAMUSCULAR | Status: AC | PRN
Start: 2022-06-22 — End: 2022-06-22
  Administered 2022-06-22: 0.5 mg via INTRAVENOUS
  Filled 2022-06-22: qty 1

## 2022-06-22 NOTE — Progress Notes (Signed)
Patient was disoriented throughout the day, urinating and defecating on floor per RN, and now agitated and restless tonight despite pharmacologic and non-pharmacologic interventions. Plan to use soft-restraints for patient safety with frequent reassessment and removal as soon as possible.

## 2022-06-22 NOTE — Plan of Care (Signed)
  Problem: Coping: Goal: Level of anxiety will decrease Outcome: Progressing   Problem: Elimination: Goal: Will not experience complications related to bowel motility Outcome: Progressing Goal: Will not experience complications related to urinary retention Outcome: Progressing   Problem: Activity: Goal: Risk for activity intolerance will decrease Outcome: Not Progressing   Problem: Nutrition: Goal: Adequate nutrition will be maintained Outcome: Not Progressing

## 2022-06-22 NOTE — NC FL2 (Signed)
[place] LEVEL OF CARE SCREENING TOOL     IDENTIFICATION  Patient Name: Robert Hamilton Birthdate: 1934-08-30 Sex: male Admission Date (Current Location): 06/06/2022  [facility] and [place] Number:  Herbalist and Address:  The . [facility], [place] [address] [place], [place] [address]      Provider Number: [id number]  Attending Physician Name and Address:  [name], MD  Relative Name and Phone Number:  Phylis [phone]    Current Level of Care: Hospital Recommended Level of Care: [place] Prior Approval Number:    Date Approved/Denied:   PASRR Number: pending  Discharge Plan: SNF    Current Diagnoses: Patient Active Problem List   Diagnosis Date Noted   Lactic acidosis 06/16/2022   Acute respiratory failure with hypoxia ([place]) 06/16/2022   Atrial fibrillation with RVR ([place]) 06/28/2022   Hematochezia    Acute lower GI bleeding 04/24/2022   Chronic kidney disease, stage 3a ([place]) 04/24/2022   DNR (do not resuscitate) 04/24/2022   Closed fracture of other facial bone 03/25/2022   Metastasis to brain ([place]) 03/20/2022   CAP (community acquired pneumonia) 01/12/2022   Orthostatic hypotension    Persistent atrial fibrillation ([place]) 12/27/2021   Anemia due to antineoplastic chemotherapy 12/09/2021   Anemia 09/27/2021   Malignant neoplasm of overlapping sites of left lung ([place]) 09/11/2021   Metastasis to bone ([place]) 06/27/2021   Protein-calorie malnutrition, severe 06/27/2021   Squamous cell lung cancer, left ([place])    Pain due to malignant neoplasm metastatic to bone ([place]) 06/25/2021   Pulmonary embolism ([place]) 06/25/2021   Hypercalcemia of malignancy 06/25/2021   Chronic combined systolic and diastolic heart failure ([place]) 07/19/2019   Nonrheumatic mitral valve regurgitation 07/19/2019   Thrombocytopenia ([place]) 03/23/2019   Diverticulosis of colon with hemorrhage    Acute blood loss anemia    AKI (acute  kidney injury) ([place]) 03/22/2019   History of prostate cancer 03/22/2019   NSVT (nonsustained ventricular tachycardia) ([place]) 03/22/2019   GIB (gastrointestinal bleeding) 03/21/2019   Lower GI bleeding 11/19/2018   Hepatic lesion 05/22/2017   History of diverticulitis 05/22/2017   History of GI bleed 05/22/2017   Prostate cancer ([place]) 05/22/2017   GERD (gastroesophageal reflux disease) 05/22/2017   Normocytic anemia 05/22/2017   Essential hypertension 05/22/2017   Chronic kidney disease 05/22/2017   Cigarette smoker 05/22/2017    Orientation RESPIRATION BLADDER Height & Weight     Self, Time, Situation  O2 (2L) Incontinent Weight: 146 lb 2.6 oz (66.3 kg) Height:  5\' 9"  (175.3 cm)  BEHAVIORAL SYMPTOMS/MOOD NEUROLOGICAL BOWEL NUTRITION STATUS      Incontinent Diet (See DC summary)  AMBULATORY STATUS COMMUNICATION OF NEEDS Skin   Limited Assist Verbally Surgical wounds (Wound / Incision (Open or Dehisced) 06/16/22 Irritant Dermatitis (Moisture Associated Skin Damage) Sacrum Mid mild skin breakdown)                       Personal Care Assistance Level of Assistance  Bathing, Feeding, Dressing Bathing Assistance: Limited assistance Feeding assistance: Independent Dressing Assistance: Limited assistance     Functional Limitations Info  Sight, Hearing, Speech Sight Info: Impaired Hearing Info: Adequate Speech Info: Adequate    SPECIAL CARE FACTORS FREQUENCY  PT (By licensed PT), OT (By licensed OT)     PT Frequency: 5x per week OT Frequency: 5x per week            Contractures Contractures Info: Not present  Additional Factors Info  Code Status, Allergies Code Status Info: DNR Allergies Info: Paclitaxel           Current Medications (06/22/2022):  This is the current hospital active medication list Current Facility-Administered Medications  Medication Dose Route Frequency Provider Last Rate Last Admin   acetaminophen (TYLENOL) tablet 650 mg  650 mg Oral  Q6H PRN Howerter, Justin B, DO       Or   acetaminophen (TYLENOL) suppository 650 mg  650 mg Rectal Q6H PRN Howerter, Justin B, DO       Chlorhexidine Gluconate Cloth 2 % PADS 6 each  6 each Topical Daily Shelly Coss, MD   6 each at 06/22/22 0906   divalproex (DEPAKOTE SPRINKLE) capsule 250 mg  250 mg Oral Q12H [name], MD   250 mg at 06/22/22 0906   guaiFENesin-dextromethorphan (ROBITUSSIN DM) 100-10 MG/5ML syrup 5 mL  5 mL Oral Q4H PRN Shelly Coss, MD   5 mL at 06/18/22 1904   lactated ringers infusion   Intravenous Continuous [name], MD 75 mL/hr at 06/22/22 0911 New Bag at 06/22/22 0911   levalbuterol (XOPENEX) nebulizer solution 1.25 mg  1.25 mg Nebulization Q4H PRN Howerter, Justin B, DO   1.25 mg at 06/17/22 2341   lidocaine (PF) (XYLOCAINE) 1 % injection    PRN Lura Em, PA   5 mL at 06/18/22 0855   loperamide (IMODIUM) capsule 2 mg  2 mg Oral TID PRN Shelly Coss, MD   2 mg at 06/18/22 1458   melatonin tablet 5 mg  5 mg Oral QHS PRN Irene Pap N, DO   5 mg at 06/18/22 2306   metoprolol succinate (TOPROL-XL) 24 hr tablet 50 mg  50 mg Oral Daily Shelly Coss, MD   50 mg at 06/22/22 9470   Oral care mouth rinse  15 mL Mouth Rinse PRN Adhikari, Tamsen Meek, MD       sodium chloride flush (NS) 0.9 % injection 10-40 mL  10-40 mL Intracatheter Q12H Adhikari, Amrit, MD   10 mL at 06/22/22 0906   sodium chloride flush (NS) 0.9 % injection 10-40 mL  10-40 mL Intracatheter PRN Shelly Coss, MD         Discharge Medications: Please see discharge summary for a list of discharge medications.  Relevant Imaging Results:  Relevant Lab Results:   Additional Information SSN# [id number]  Bary Castilla, LCSW

## 2022-06-22 NOTE — Progress Notes (Signed)
Pt's HR was in 140-150's. MD [place] informed, Cardizem started @5  now as a titrated dose, BP is stable, pt is confused and insisting going home, Daughter was there for a while and left, will continue to monitor  Palma Holter, RN

## 2022-06-22 NOTE — Progress Notes (Signed)
[facility]  PROGRESS NOTE  Robert Hamilton YQI:[id number] DOB: [birth year] DOA: 06/17/2022 PCP: Maryella Shivers, MD   Brief HPI:   88 year old male with history of metastatic cancer to brain, paroxysmal atrial fibrillation not on anticoagulation in setting of GI bleed, chronic systolic/diastolic CHF who presented from home with cough, fast heartbeat, chills and shortness of breath.  Patient was recently hospitalized in May for acute lower GI bleed.  During this admission patient was found to be in A-fib with RVR, started on Cardizem drip.  Also found to have airspace opacities in right lung base as well as left perihilar region concerning for pneumonia.  Patient started on antibiotics.    Subjective   Patient seen and examined, eating breakfast in bed.  Denies any complaints.   Assessment/Plan:    Atrial fibrillation with RVR -Started on Cardizem gtt. which has been discontinued -Not on anticoagulation due to history of GI bleed; CHA2DS2-VASc score of 5 -Also has mets to brain, likely not a candidate for anticoagulation -Continue metoprolol 50 mg daily  Sepsis/community-acquired pneumonia -Presents with fever, chills, elevated lactate on presentation -Chest x-ray showed possible pneumonia in the right lung base -Completed 7-day course of ceftriaxone and doxycycline in the hospital -He is on oxygen 2 L/min as needed at home -CT chest without contrast done for the follow-up exam showed moderate right-sided pleural effusion, S/P  thoracentesis.  It also showed progression of left hilar mass.  Recent history of lower GI bleed -Felt to be due to hemorrhoidal bleeding -Hemoglobin stable at 9.1  Metastatic left lung cancer -Found to have multiple brain metastasis s/p stereotactic radiosurgery -Palliative care consulted for poor prognosis -CT imaging showed progressive left hilar mass  Chronic combined systolic/diastolic CHF -Entresto was held due to AKI -Echo done on  04/26/2021 showed EF of 35 to 38%, grade 2 diastolic dysfunction  Acute kidney injury on CKD stage III a -Baseline creatinine 0.9-1.2 -Creatinine has been fluctuating over past few days -Creatinine was  elevated at 2.36, BUN 25 -Creatinine improved to 1.55 with IV fluids -We will continue with  IV LR at 75 mill per hour for 12 hours -Follow BMP in am  Hypertension -Continue Entresto, metoprolol  History of gout -Continue allopurinol  Disposition -Plan to go to skilled nursing facility rehab   Medications     Chlorhexidine Gluconate Cloth  6 each Topical Daily   divalproex  250 mg Oral Q12H   metoprolol succinate  50 mg Oral Daily   sodium chloride flush  10-40 mL Intracatheter Q12H     Data Reviewed:   CBG:  Recent Labs  Lab 06/22/22 0607  GLUCAP 89    SpO2: 97 % O2 Flow Rate (L/min): 5 L/min    Vitals:   06/21/22 1947 06/21/22 2356 06/22/22 0403 06/22/22 0712  BP: 117/76 116/81 124/83 (!) 122/91  Pulse: 86 99 94 84  Resp: 14 12 20 11   Temp: 97.6 F (36.4 C) (!) 97.4 F (36.3 C) (!) 97.4 F (36.3 C) 97.6 F (36.4 C)  TempSrc: Axillary Oral Axillary Oral  SpO2: 100% 94% 97%   Weight:   66.3 kg   Height:          Data Reviewed:  Basic Metabolic Panel: Recent Labs  Lab 06/17/2022 2000 06/01/2022 2014 06/16/22 0459 06/17/22 0052 06/18/22 0305 06/19/22 0526 06/20/22 0828 06/21/22 0500 06/22/22 0706  NA 143   < > 141   < > 142 140 142 142 139  K 3.9   < >  3.8   < > 3.4* 4.7 4.8 4.6 4.4  CL 106   < > 109   < > 109 113* 112* 110 111  CO2 17*  --  18*   < > 22 17* 18* 20* 20*  GLUCOSE 128*   < > 106*   < > 100* 108* 91 79 89  BUN 13   < > 11   < > 11 14 25* 27* 23  CREATININE 1.51*   < > 1.24   < > 1.21 1.73* 2.36* 2.03* 1.55*  CALCIUM 8.8*  --  8.7*   < > 8.4* 8.7* 8.6* 8.6* 8.2*  MG 1.9  --  2.2  --   --   --   --   --   --   PHOS  --   --  3.0  --   --   --   --   --   --    < > = values in this interval not displayed.    CBC: Recent Labs   Lab 06/13/2022 2000 06/28/2022 2014 06/16/22 0459 06/17/22 0052 06/19/22 0526  WBC 7.6  --  7.4 8.2 9.1  NEUTROABS 5.8  --  5.4  --   --   HGB 10.6* 12.2* 9.3* 9.5* 9.1*  HCT 37.0* 36.0* 32.2* 33.0* 32.7*  MCV 92.3  --  90.4 89.9 92.6  PLT 350  --  345 233 367    LFT Recent Labs  Lab 06/16/2022 2000 06/16/22 0459  AST 21 19  ALT 10 8  ALKPHOS 67 70  BILITOT 1.4* 0.7  PROT 6.5 6.0*  ALBUMIN 2.5* 2.3*     Antibiotics: Anti-infectives (From admission, onward)    Start     Dose/Rate Route Frequency Ordered Stop   06/17/22 1430  doxycycline (VIBRA-TABS) tablet 100 mg        100 mg Oral Every 12 hours 06/17/22 1335 06/20/22 0959   06/14/2022 2145  cefTRIAXone (ROCEPHIN) 2 g in sodium chloride 0.9 % 100 mL IVPB        2 g 200 mL/hr over 30 Minutes Intravenous Every 24 hours 06/14/2022 2142 06/20/22 2159   06/02/2022 2145  azithromycin (ZITHROMAX) 500 mg in sodium chloride 0.9 % 250 mL IVPB  Status:  Discontinued        500 mg 250 mL/hr over 60 Minutes Intravenous Every 24 hours 06/25/2022 2142 06/17/22 1335        DVT prophylaxis: SCDs  Code Status: Full code  Family Communication: No family at bedside   CONSULTS palliative care   Objective    Physical Examination:  General-appears in no acute distress Heart-S1-S2, regular, no murmur auscultated Lungs-clear to auscultation bilaterally, no wheezing or crackles auscultated Abdomen-soft, nontender, no organomegaly Extremities-no edema in the lower extremities Neuro-alert, oriented x3, no focal deficit noted   Status is: Inpatient:          [name]   [facility] If 7PM-7AM, please contact night-coverage at [email or web address], Office  [phone]   06/22/2022, 7:57 AM  LOS: 7 days

## 2022-06-22 NOTE — TOC Progression Note (Signed)
Transition of Care [facility]) - Progression Note    Patient Details  Name: Robert Hamilton MRN: [id number] Date of Birth: 02-23-1934  Transition of Care [facility]) CM/SW [place], LCSW Phone Number: 06/22/2022, 9:52 AM  Clinical Narrative:     CSW completed pt's fl2 and sent referral through hub. Pt's PASRR was unable to be retrieved out of system therefore follow up will be needed on Monday when they open.  TOC team will continue to assist with discharge planning needs.        Expected Discharge Plan and Services                                                 Social Determinants of Health (SDOH) Interventions    Readmission Risk Interventions     No data to display

## 2022-06-22 NOTE — Progress Notes (Signed)
Pt has increasing confusion and consistently tries to get out of bed.Pt wedged his leg  into the bed side rail. RN and CNA was able to safely unwedge his leg and pt denied having pain and no noted injuries after reassessment. MD made aware awaiting order.

## 2022-06-23 LAB — BASIC METABOLIC PANEL
Anion gap: 16 — ABNORMAL HIGH (ref 5–15)
BUN: 27 mg/dL — ABNORMAL HIGH (ref 8–23)
CO2: 13 mmol/L — ABNORMAL LOW (ref 22–32)
Calcium: 8.5 mg/dL — ABNORMAL LOW (ref 8.9–10.3)
Chloride: 112 mmol/L — ABNORMAL HIGH (ref 98–111)
Creatinine, Ser: 1.88 mg/dL — ABNORMAL HIGH (ref 0.61–1.24)
GFR, Estimated: 35 mL/min — ABNORMAL LOW (ref >60)
Glucose, Bld: 53 mg/dL — ABNORMAL LOW (ref 70–99)
Potassium: 5.1 mmol/L (ref 3.5–5.1)
Sodium: 141 mmol/L (ref 135–145)

## 2022-06-23 LAB — CULTURE, BODY FLUID W GRAM STAIN -BOTTLE: Culture: NO GROWTH

## 2022-06-26 ENCOUNTER — Ambulatory Visit: Payer: Medicare Other | Admitting: Cardiology

## 2022-06-28 ENCOUNTER — Other Ambulatory Visit: Payer: Medicare Other

## 2022-07-02 ENCOUNTER — Ambulatory Visit: Payer: Self-pay | Admitting: Urology

## 2022-07-02 NOTE — Discharge Summary (Signed)
Death Summary  Ronne Stefanski MEB:[id number] DOB: 1934/04/05 DOA: 2022/06/22  PCP: Maryella Shivers, MD PCP/Office notified:   Admit date: 06/22/2022 Date of Death: 30-Jun-2022  Final Diagnoses:  Principal Problem:   Atrial fibrillation with RVR ([place]) Active Problems:   AKI (acute kidney injury) ([place])   Chronic combined systolic and diastolic heart failure ([place])   CAP (community acquired pneumonia)   Lactic acidosis   Acute respiratory failure with hypoxia (HCC)      History of present illness:  88 year old male with history of metastatic cancer to brain, paroxysmal atrial fibrillation not on anticoagulation in setting of GI bleed, chronic systolic/diastolic CHF who presented from home with cough, fast heartbeat, chills and shortness of breath.  Patient was recently hospitalized in May for acute lower GI bleed.  During this admission patient was found to be in A-fib with RVR, started on Cardizem drip.  Also found to have airspace opacities in right lung base as well as left perihilar region concerning for pneumonia.  Patient started on antibiotics.    Hospital Course:   Patient was admitted with sepsis due to community-acquired pneumonia, he was started on ceftriaxone and doxycycline which was completed in the hospital.  CT chest without contrast showed moderate right-sided pleural effusion s/p thoracentesis also showed progression of left hilar mass.  Patient was found to be in atrial fibrillation with RVR in the hospital and required Cardizem.  Patient has history of metastatic lung cancer with multiple brain metastasis s/p elective redo surgery.  Patient was supposed to go to skilled nursing facility.  He was found to be in acute kidney injury and required IV fluids with improvement in renal function.  Patient went into PEA arrest on 30-Jun-2022, briefly was given CPR however once DNR was confirmed with family.  CPR was stopped. Patient expired at 6 20 am   Time: Of death 6:20  AM  Signed:  [name]  [facility] 2022/06/30, 8:57 AM

## 2022-07-02 NOTE — Code Documentation (Signed)
  Patient Name: Prashant Glosser   MRN: [id number]   Date of Birth/ Sex: 01-Aug-1934 , male      Admission Date: 06/07/2022  Attending Provider: [name], MD  Primary Diagnosis: Shortness of breath [R06.02] Atrial fibrillation with RVR ([place]) [I48.91] Community acquired pneumonia, unspecified laterality [J18.9]   Indication: Pt was in his usual state of health until this AM, when he was noted to be in PEA arrest. Code blue was subsequently called. At the time of arrival on scene, ACLS protocol was underway. However, upon our arrival we identified that he was a DNR/DNI order was in the chart. The hospital note from yesterday morning noted full code and nursing staff also were told patient was a full code. CPR was continued until confirmed with the daughter that the patient was in fact a DNR. CPR was stopped at this time  Post CPR evaluation:  - Final Status - Was patient successfully resuscitated ? No  - Family Notified? Yes     Riesa Pope, MD   07/19/22, 6:30 AM

## 2022-07-02 NOTE — Progress Notes (Signed)
Pt HR bradycardia found unresponsive, restraints were removed and Rapid was called to bedside. Pt went asystole at 6:11am, code called, see code sheet. Time of death called at 6:20am. Family notified by [facility]. Post mortem care performed per policy.

## 2022-07-02 NOTE — Progress Notes (Signed)
Notified [facility] at [phone] and spoke to [employer]. Reference number  [id number]. Time of death 81am.

## 2022-07-02 NOTE — Plan of Care (Signed)
  Problem: Clinical Measurements: Goal: Respiratory complications will improve Outcome: Progressing Goal: Cardiovascular complication will be avoided Outcome: Progressing   Problem: Elimination: Goal: Will not experience complications related to urinary retention Outcome: Progressing   Problem: Pain Managment: Goal: General experience of comfort will improve Outcome: Progressing   Problem: Coping: Goal: Level of anxiety will decrease Outcome: Not Progressing   Problem: Safety: Goal: Non-violent Restraint(s) Outcome: Not Progressing

## 2022-07-02 DEATH — deceased

## 2022-07-04 ENCOUNTER — Ambulatory Visit: Payer: Medicare Other | Admitting: Internal Medicine

## 2022-07-10 ENCOUNTER — Ambulatory Visit: Payer: Medicare Other | Admitting: Cardiology

## 2022-07-15 MED FILL — Medication: Qty: 1 | Status: AC
